# Patient Record
Sex: Male | Born: 1965 | Race: White | Hispanic: No | Marital: Married | Smoking: Former smoker
Health system: Southern US, Community
[De-identification: ages and names within clinical notes are randomized; demographics above are authoritative.]

## PROBLEM LIST (undated history)

## (undated) DIAGNOSIS — I219 Acute myocardial infarction, unspecified: Secondary | ICD-10-CM

## (undated) DIAGNOSIS — R0789 Other chest pain: Secondary | ICD-10-CM

## (undated) DIAGNOSIS — Z8669 Personal history of other diseases of the nervous system and sense organs: Secondary | ICD-10-CM

## (undated) DIAGNOSIS — I10 Essential (primary) hypertension: Secondary | ICD-10-CM

## (undated) DIAGNOSIS — I255 Ischemic cardiomyopathy: Secondary | ICD-10-CM

## (undated) DIAGNOSIS — C6290 Malignant neoplasm of unspecified testis, unspecified whether descended or undescended: Secondary | ICD-10-CM

## (undated) DIAGNOSIS — I249 Acute ischemic heart disease, unspecified: Secondary | ICD-10-CM

## (undated) DIAGNOSIS — E785 Hyperlipidemia, unspecified: Secondary | ICD-10-CM

## (undated) DIAGNOSIS — K219 Gastro-esophageal reflux disease without esophagitis: Secondary | ICD-10-CM

## (undated) DIAGNOSIS — I2510 Atherosclerotic heart disease of native coronary artery without angina pectoris: Secondary | ICD-10-CM

## (undated) DIAGNOSIS — Z91018 Allergy to other foods: Secondary | ICD-10-CM

## (undated) HISTORY — DX: Malignant neoplasm of unspecified testis, unspecified whether descended or undescended: C62.90

## (undated) HISTORY — DX: Gastro-esophageal reflux disease without esophagitis: K21.9

## (undated) HISTORY — DX: Personal history of other diseases of the nervous system and sense organs: Z86.69

## (undated) HISTORY — DX: Allergy to other foods: Z91.018

## (undated) HISTORY — DX: Ischemic cardiomyopathy: I25.5

## (undated) HISTORY — DX: Other chest pain: R07.89

## (undated) HISTORY — PX: ORCHIECTOMY: SHX2116

## (undated) HISTORY — DX: Hyperlipidemia, unspecified: E78.5

---

## 1998-08-29 ENCOUNTER — Ambulatory Visit (HOSPITAL_COMMUNITY): Admission: RE | Admit: 1998-08-29 | Discharge: 1998-08-29 | Payer: Self-pay | Admitting: Hematology and Oncology

## 1998-09-04 ENCOUNTER — Ambulatory Visit (HOSPITAL_COMMUNITY): Admission: RE | Admit: 1998-09-04 | Discharge: 1998-09-04 | Payer: Self-pay | Admitting: Hematology and Oncology

## 1998-09-04 ENCOUNTER — Encounter: Payer: Self-pay | Admitting: Hematology and Oncology

## 1999-06-12 ENCOUNTER — Encounter: Payer: Self-pay | Admitting: Family Medicine

## 1999-06-12 ENCOUNTER — Ambulatory Visit (HOSPITAL_COMMUNITY): Admission: RE | Admit: 1999-06-12 | Discharge: 1999-06-12 | Payer: Self-pay | Admitting: Family Medicine

## 1999-11-02 ENCOUNTER — Encounter: Payer: Self-pay | Admitting: Hematology and Oncology

## 1999-11-02 ENCOUNTER — Encounter: Admission: RE | Admit: 1999-11-02 | Discharge: 1999-11-02 | Payer: Self-pay | Admitting: Hematology and Oncology

## 2000-02-15 ENCOUNTER — Encounter: Payer: Self-pay | Admitting: Hematology and Oncology

## 2000-02-15 ENCOUNTER — Ambulatory Visit (HOSPITAL_COMMUNITY): Admission: RE | Admit: 2000-02-15 | Discharge: 2000-02-15 | Payer: Self-pay | Admitting: Hematology and Oncology

## 2000-02-17 ENCOUNTER — Ambulatory Visit (HOSPITAL_COMMUNITY): Admission: RE | Admit: 2000-02-17 | Discharge: 2000-02-17 | Payer: Self-pay | Admitting: Hematology and Oncology

## 2000-02-17 ENCOUNTER — Encounter: Payer: Self-pay | Admitting: Hematology and Oncology

## 2000-03-14 ENCOUNTER — Encounter (INDEPENDENT_AMBULATORY_CARE_PROVIDER_SITE_OTHER): Payer: Self-pay

## 2000-03-14 ENCOUNTER — Encounter: Payer: Self-pay | Admitting: Urology

## 2000-03-14 ENCOUNTER — Ambulatory Visit (HOSPITAL_COMMUNITY): Admission: RE | Admit: 2000-03-14 | Discharge: 2000-03-14 | Payer: Self-pay | Admitting: Urology

## 2000-04-05 ENCOUNTER — Encounter: Admission: RE | Admit: 2000-04-05 | Discharge: 2000-07-04 | Payer: Self-pay | Admitting: Radiation Oncology

## 2000-09-02 ENCOUNTER — Encounter: Payer: Self-pay | Admitting: Urology

## 2000-09-02 ENCOUNTER — Encounter: Admission: RE | Admit: 2000-09-02 | Discharge: 2000-09-02 | Payer: Self-pay | Admitting: Urology

## 2001-01-02 ENCOUNTER — Encounter: Payer: Self-pay | Admitting: Urology

## 2001-01-02 ENCOUNTER — Encounter: Admission: RE | Admit: 2001-01-02 | Discharge: 2001-01-02 | Payer: Self-pay | Admitting: Urology

## 2001-04-14 ENCOUNTER — Encounter: Admission: RE | Admit: 2001-04-14 | Discharge: 2001-04-14 | Payer: Self-pay | Admitting: Urology

## 2001-04-14 ENCOUNTER — Encounter: Payer: Self-pay | Admitting: Urology

## 2001-07-21 ENCOUNTER — Encounter: Payer: Self-pay | Admitting: Urology

## 2001-07-21 ENCOUNTER — Encounter: Admission: RE | Admit: 2001-07-21 | Discharge: 2001-07-21 | Payer: Self-pay | Admitting: Urology

## 2002-02-23 ENCOUNTER — Encounter: Admission: RE | Admit: 2002-02-23 | Discharge: 2002-02-23 | Payer: Self-pay | Admitting: Urology

## 2002-02-23 ENCOUNTER — Encounter: Payer: Self-pay | Admitting: Urology

## 2002-05-30 ENCOUNTER — Encounter: Payer: Self-pay | Admitting: Emergency Medicine

## 2002-05-30 ENCOUNTER — Emergency Department (HOSPITAL_COMMUNITY): Admission: EM | Admit: 2002-05-30 | Discharge: 2002-05-30 | Payer: Self-pay | Admitting: Unknown Physician Specialty

## 2002-11-05 ENCOUNTER — Ambulatory Visit (HOSPITAL_COMMUNITY): Admission: RE | Admit: 2002-11-05 | Discharge: 2002-11-05 | Payer: Self-pay | Admitting: Urology

## 2002-11-05 ENCOUNTER — Encounter: Payer: Self-pay | Admitting: Urology

## 2003-03-19 ENCOUNTER — Ambulatory Visit (HOSPITAL_COMMUNITY): Admission: RE | Admit: 2003-03-19 | Discharge: 2003-03-19 | Payer: Self-pay | Admitting: Urology

## 2003-10-04 ENCOUNTER — Ambulatory Visit (HOSPITAL_COMMUNITY): Admission: RE | Admit: 2003-10-04 | Discharge: 2003-10-04 | Payer: Self-pay | Admitting: Urology

## 2003-12-24 ENCOUNTER — Ambulatory Visit (HOSPITAL_COMMUNITY): Admission: RE | Admit: 2003-12-24 | Discharge: 2003-12-24 | Payer: Self-pay | Admitting: Family Medicine

## 2004-04-21 ENCOUNTER — Ambulatory Visit (HOSPITAL_COMMUNITY): Admission: RE | Admit: 2004-04-21 | Discharge: 2004-04-21 | Payer: Self-pay | Admitting: Urology

## 2004-11-24 ENCOUNTER — Ambulatory Visit (HOSPITAL_COMMUNITY): Admission: RE | Admit: 2004-11-24 | Discharge: 2004-11-24 | Payer: Self-pay | Admitting: Urology

## 2005-05-24 ENCOUNTER — Ambulatory Visit (HOSPITAL_COMMUNITY): Admission: RE | Admit: 2005-05-24 | Discharge: 2005-05-24 | Payer: Self-pay | Admitting: Urology

## 2006-10-12 ENCOUNTER — Ambulatory Visit (HOSPITAL_COMMUNITY): Admission: RE | Admit: 2006-10-12 | Discharge: 2006-10-12 | Payer: Self-pay | Admitting: Urology

## 2008-04-05 ENCOUNTER — Encounter: Admission: RE | Admit: 2008-04-05 | Discharge: 2008-04-05 | Payer: Self-pay | Admitting: Family Medicine

## 2008-05-27 LAB — HM COLONOSCOPY: HM Colonoscopy: NORMAL

## 2008-07-26 ENCOUNTER — Ambulatory Visit: Payer: Self-pay | Admitting: Diagnostic Radiology

## 2008-07-26 ENCOUNTER — Emergency Department (HOSPITAL_BASED_OUTPATIENT_CLINIC_OR_DEPARTMENT_OTHER): Admission: EM | Admit: 2008-07-26 | Discharge: 2008-07-26 | Payer: Self-pay | Admitting: Emergency Medicine

## 2008-07-29 ENCOUNTER — Ambulatory Visit: Payer: Self-pay | Admitting: Internal Medicine

## 2008-07-29 DIAGNOSIS — I119 Hypertensive heart disease without heart failure: Secondary | ICD-10-CM

## 2008-07-29 DIAGNOSIS — E785 Hyperlipidemia, unspecified: Secondary | ICD-10-CM | POA: Insufficient documentation

## 2008-07-29 DIAGNOSIS — K219 Gastro-esophageal reflux disease without esophagitis: Secondary | ICD-10-CM

## 2008-07-29 DIAGNOSIS — R0789 Other chest pain: Secondary | ICD-10-CM

## 2008-08-05 ENCOUNTER — Telehealth (INDEPENDENT_AMBULATORY_CARE_PROVIDER_SITE_OTHER): Payer: Self-pay | Admitting: *Deleted

## 2008-08-06 ENCOUNTER — Encounter: Payer: Self-pay | Admitting: Cardiology

## 2008-08-06 ENCOUNTER — Ambulatory Visit: Payer: Self-pay

## 2008-08-12 DIAGNOSIS — Z860100 Personal history of colon polyps, unspecified: Secondary | ICD-10-CM | POA: Insufficient documentation

## 2008-08-12 DIAGNOSIS — J309 Allergic rhinitis, unspecified: Secondary | ICD-10-CM | POA: Insufficient documentation

## 2008-08-21 ENCOUNTER — Encounter: Payer: Self-pay | Admitting: Cardiology

## 2008-08-21 ENCOUNTER — Ambulatory Visit: Payer: Self-pay | Admitting: Cardiology

## 2008-08-21 DIAGNOSIS — R002 Palpitations: Secondary | ICD-10-CM | POA: Insufficient documentation

## 2008-08-23 ENCOUNTER — Ambulatory Visit: Payer: Self-pay | Admitting: Internal Medicine

## 2008-08-23 LAB — CONVERTED CEMR LAB
ALT: 25 units/L (ref 0–53)
AST: 15 units/L (ref 0–37)
Albumin: 4.3 g/dL (ref 3.5–5.2)
Alkaline Phosphatase: 66 units/L (ref 39–117)
BUN: 16 mg/dL (ref 6–23)
Basophils Absolute: 0.1 10*3/uL (ref 0.0–0.1)
Basophils Relative: 1 % (ref 0–1)
Bilirubin, Direct: 0.2 mg/dL (ref 0.0–0.3)
CO2: 21 meq/L (ref 19–32)
Calcium: 8.7 mg/dL (ref 8.4–10.5)
Chloride: 105 meq/L (ref 96–112)
Cholesterol: 201 mg/dL — ABNORMAL HIGH (ref 0–200)
Creatinine, Ser: 0.89 mg/dL (ref 0.40–1.50)
Eosinophils Absolute: 0.2 10*3/uL (ref 0.0–0.7)
Eosinophils Relative: 2 % (ref 0–5)
Glucose, Bld: 98 mg/dL (ref 70–99)
HCT: 44.6 % (ref 39.0–52.0)
HDL: 31 mg/dL — ABNORMAL LOW (ref >39)
Hemoglobin: 15.4 g/dL (ref 13.0–17.0)
Indirect Bilirubin: 1.3 mg/dL — ABNORMAL HIGH (ref 0.0–0.9)
Lymphocytes Relative: 21 % (ref 12–46)
Lymphs Abs: 1.7 10*3/uL (ref 0.7–4.0)
MCHC: 34.5 g/dL (ref 30.0–36.0)
MCV: 88.5 fL (ref 78.0–100.0)
Monocytes Absolute: 0.8 10*3/uL (ref 0.1–1.0)
Monocytes Relative: 10 % (ref 3–12)
Neutro Abs: 5.3 10*3/uL (ref 1.7–7.7)
Neutrophils Relative %: 66 % (ref 43–77)
Platelets: 205 10*3/uL (ref 150–400)
Potassium: 4.2 meq/L (ref 3.5–5.3)
RBC: 5.04 M/uL (ref 4.22–5.81)
RDW: 13.5 % (ref 11.5–15.5)
Sodium: 141 meq/L (ref 135–145)
TSH: 0.494 microintl units/mL (ref 0.350–4.500)
Total Bilirubin: 1.5 mg/dL — ABNORMAL HIGH (ref 0.3–1.2)
Total CHOL/HDL Ratio: 6.5
Total Protein: 6.8 g/dL (ref 6.0–8.3)
Triglycerides: 410 mg/dL — ABNORMAL HIGH (ref <150)
WBC: 8.1 10*3/uL (ref 4.0–10.5)

## 2008-09-04 ENCOUNTER — Telehealth: Payer: Self-pay | Admitting: Internal Medicine

## 2008-09-27 ENCOUNTER — Ambulatory Visit: Payer: Self-pay | Admitting: Internal Medicine

## 2008-11-12 ENCOUNTER — Ambulatory Visit: Payer: Self-pay | Admitting: Internal Medicine

## 2008-11-12 DIAGNOSIS — J029 Acute pharyngitis, unspecified: Secondary | ICD-10-CM

## 2008-11-12 LAB — CONVERTED CEMR LAB: Rapid Strep: NEGATIVE

## 2009-02-28 ENCOUNTER — Telehealth: Payer: Self-pay | Admitting: Internal Medicine

## 2009-03-07 ENCOUNTER — Ambulatory Visit: Payer: Self-pay | Admitting: Internal Medicine

## 2009-03-07 DIAGNOSIS — L919 Hypertrophic disorder of the skin, unspecified: Secondary | ICD-10-CM

## 2009-03-07 DIAGNOSIS — L272 Dermatitis due to ingested food: Secondary | ICD-10-CM

## 2009-03-07 DIAGNOSIS — L909 Atrophic disorder of skin, unspecified: Secondary | ICD-10-CM | POA: Insufficient documentation

## 2009-10-17 ENCOUNTER — Ambulatory Visit: Payer: Self-pay | Admitting: Diagnostic Radiology

## 2009-10-17 ENCOUNTER — Ambulatory Visit (HOSPITAL_BASED_OUTPATIENT_CLINIC_OR_DEPARTMENT_OTHER): Admission: RE | Admit: 2009-10-17 | Discharge: 2009-10-17 | Payer: Self-pay | Admitting: Internal Medicine

## 2009-10-17 ENCOUNTER — Ambulatory Visit: Payer: Self-pay | Admitting: Internal Medicine

## 2009-10-17 DIAGNOSIS — R0602 Shortness of breath: Secondary | ICD-10-CM | POA: Insufficient documentation

## 2009-10-17 DIAGNOSIS — Z8547 Personal history of malignant neoplasm of testis: Secondary | ICD-10-CM | POA: Insufficient documentation

## 2009-10-17 DIAGNOSIS — C6290 Malignant neoplasm of unspecified testis, unspecified whether descended or undescended: Secondary | ICD-10-CM | POA: Insufficient documentation

## 2009-10-17 LAB — CONVERTED CEMR LAB
BUN: 18 mg/dL (ref 6–23)
Basophils Absolute: 0.1 10*3/uL (ref 0.0–0.1)
Basophils Relative: 1 % (ref 0–1)
CO2: 29 meq/L (ref 19–32)
Calcium: 9.5 mg/dL (ref 8.4–10.5)
Chloride: 102 meq/L (ref 96–112)
Creatinine, Ser: 0.94 mg/dL (ref 0.40–1.50)
Eosinophils Absolute: 0.1 10*3/uL (ref 0.0–0.7)
Eosinophils Relative: 1 % (ref 0–5)
Glucose, Bld: 107 mg/dL — ABNORMAL HIGH (ref 70–99)
HCT: 44.2 % (ref 39.0–52.0)
Hemoglobin: 15.8 g/dL (ref 13.0–17.0)
Lymphocytes Relative: 21 % (ref 12–46)
Lymphs Abs: 1.9 10*3/uL (ref 0.7–4.0)
MCHC: 35.7 g/dL (ref 30.0–36.0)
MCV: 83.6 fL (ref 78.0–100.0)
Monocytes Absolute: 0.6 10*3/uL (ref 0.1–1.0)
Monocytes Relative: 7 % (ref 3–12)
Neutro Abs: 6.3 10*3/uL (ref 1.7–7.7)
Neutrophils Relative %: 70 % (ref 43–77)
Platelets: 218 10*3/uL (ref 150–400)
Potassium: 4.2 meq/L (ref 3.5–5.3)
Pro B Natriuretic peptide (BNP): <2 pg/mL (ref 0.0–100.0)
RBC: 5.29 M/uL (ref 4.22–5.81)
RDW: 12.5 % (ref 11.5–15.5)
Sodium: 142 meq/L (ref 135–145)
Testosterone: 140.75 ng/dL — ABNORMAL LOW (ref 350–890)
WBC: 8.9 10*3/uL (ref 4.0–10.5)

## 2009-10-21 ENCOUNTER — Telehealth: Payer: Self-pay | Admitting: Internal Medicine

## 2009-10-21 ENCOUNTER — Ambulatory Visit: Payer: Self-pay | Admitting: Internal Medicine

## 2009-10-22 ENCOUNTER — Encounter: Payer: Self-pay | Admitting: Internal Medicine

## 2009-10-27 ENCOUNTER — Telehealth: Payer: Self-pay | Admitting: Internal Medicine

## 2009-12-22 ENCOUNTER — Ambulatory Visit: Payer: Self-pay | Admitting: Family

## 2009-12-22 DIAGNOSIS — R1310 Dysphagia, unspecified: Secondary | ICD-10-CM | POA: Insufficient documentation

## 2010-01-09 ENCOUNTER — Ambulatory Visit: Payer: Self-pay | Admitting: Internal Medicine

## 2010-01-09 DIAGNOSIS — J018 Other acute sinusitis: Secondary | ICD-10-CM

## 2010-02-03 ENCOUNTER — Telehealth: Payer: Self-pay | Admitting: Internal Medicine

## 2010-03-02 ENCOUNTER — Ambulatory Visit
Admission: RE | Admit: 2010-03-02 | Discharge: 2010-03-02 | Payer: Self-pay | Source: Home / Self Care | Attending: Internal Medicine | Admitting: Internal Medicine

## 2010-03-02 ENCOUNTER — Telehealth: Payer: Self-pay | Admitting: Internal Medicine

## 2010-03-02 DIAGNOSIS — L03221 Cellulitis of neck: Secondary | ICD-10-CM

## 2010-03-02 DIAGNOSIS — L0211 Cutaneous abscess of neck: Secondary | ICD-10-CM | POA: Insufficient documentation

## 2010-03-06 ENCOUNTER — Encounter
Admission: RE | Admit: 2010-03-06 | Discharge: 2010-03-06 | Payer: Self-pay | Source: Home / Self Care | Attending: Internal Medicine | Admitting: Internal Medicine

## 2010-03-06 ENCOUNTER — Telehealth: Payer: Self-pay | Admitting: Internal Medicine

## 2010-03-06 DIAGNOSIS — E041 Nontoxic single thyroid nodule: Secondary | ICD-10-CM | POA: Insufficient documentation

## 2010-03-09 ENCOUNTER — Telehealth: Payer: Self-pay | Admitting: Internal Medicine

## 2010-03-09 ENCOUNTER — Encounter
Admission: RE | Admit: 2010-03-09 | Discharge: 2010-03-09 | Payer: Self-pay | Source: Home / Self Care | Attending: Internal Medicine | Admitting: Internal Medicine

## 2010-03-09 DIAGNOSIS — E042 Nontoxic multinodular goiter: Secondary | ICD-10-CM | POA: Insufficient documentation

## 2010-03-10 NOTE — Assessment & Plan Note (Signed)
Summary: 2:45 appt sore throat/SOB & hi bp/dt   Vital Signs:  Patient profile:   45 year old male Height:      72 inches Weight:      195.50 pounds BMI:     26.61 O2 Sat:      97 % on Room air Temp:     98.6 degrees F oral Pulse rate:   91 / minute Pulse rhythm:   regular Resp:     18 per minute BP sitting:   130 / 100  (left arm) Cuff size:   large  Vitals Entered By: Glendell Docker CMA (October 17, 2009 3:11 PM)  O2 Flow:  Room air CC: Sore Throat Is Patient Diabetic? No Pain Assessment Patient in pain? no        Primary Care Provider:  Dondra Spry DO  CC:  Sore Throat.  History of Present Illness:  45 y/o with hx of testicular cancer c/o intermittent shortness breath no fever or chlls some neck soreness and sore throat  he is very worried that shortness of breath assoc with recurrence of testicular cancer  returned from business trip to [place] he denies calf pain or swelling  Preventive Screening-Counseling & Management  Alcohol-Tobacco     Smoking Status: quit  Allergies: 1)  ! Pcn 2)  ! Asa 3)  ! Iodine 4)  ! * Xray Dye 5)  ! * Shellfish  Past History:  Past Medical History: Current Problems:  HYPERLIPIDEMIA (ICD-272.4) HYPERTENSION (ICD-401.9)  CHEST PAIN, ATYPICAL (ICD-786.59) - negative cardiac workup 2010 ALLERGIC RHINITIS (ICD-477.9) COLONIC POLYPS, HX OF (ICD-V12.72)   GERD (ICD-530.81)  FAMILY HISTORY OF CAD MALE 1ST DEGREE RELATIVE <50 (ICD-V17.3) Testicular cancer - followed by Dr. Isabel Caprice Hx of elevated BP Hx of migraines. 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   Head:  normocephalic and atraumatic.   Ears:  R ear normal and L ear normal.   Mouth:  pharyngeal erythema.   Neck:  no masses or adenopathy Chest Wall:  No deformities, masses, tenderness or gynecomastia noted. Lungs:  normal respiratory effort and normal breath sounds.   Heart:  normal rate, regular rhythm, and no gallop.   Abdomen:  soft, non-tender, and normal bowel sounds.   Extremities:  no calf tenderness or edema Psych:  slightly anxious.     Impression & Recommendations:  Problem # 1:  SHORTNESS OF BREATH (ICD-786.05) 45 y/o with intermeittent shortness of breath.  rule out asthma.  this could be assoc with anxiety Orders: T-2 View CXR, Same Day (71020.5TC) T-Basic Metabolic Panel [phone]) T-CBC w/Diff [id number]) T-BNP  (B Natriuretic Peptide) [phone]) T- * Misc. Laboratory test [id number]) Pulmonary Referral (Pulmonary)  Problem # 2:  PHARYNGITIS (ICD-462)  His updated medication list for this problem includes:    Cefuroxime Axetil 500 Mg Tabs (Cefuroxime axetil) ..... One by mouth two times a day  Problem # 3:  TESTICULAR CANCER (ICD-186.9) check tumor markers.  forward copy to urologist  Orders: T-Testosterone; Total [phone]) T- * Misc. Laboratory test [phone])  Complete Medication List: 1)  Androgel Pump 1 % Gel (Testosterone) .... Apply to skin 6 times daily 2)  Omeprazole 20 Mg Cpdr (Omeprazole) .... Take 1 capsule by mouth once a day 3)  Epipen 2-pak 0.3 Mg/0.8ml Devi (Epinephrine) .... Use as needed for allergic reaction as directed 4)  Cefuroxime Axetil 500 Mg Tabs (Cefuroxime axetil) .... One by mouth two times a day  Patient Instructions: 1)  Please schedule a follow-up appointment in 2 weeks. 2)  Take fexofenadine 180 mg once daily Prescriptions: CEFUROXIME AXETIL  500 MG TABS (CEFUROXIME AXETIL) one by mouth two times a day  #14 x 0   Entered and Authorized by:   D. Thomos Lemons DO   Signed by:   D. Thomos Lemons DO on 10/17/2009   Method used:   Electronically to        [id number] (retail)       [address] [place], [place]  [address]       Ph: [phone]       Fax: [phone]   RxID:   [id number]   Current Allergies (reviewed today): ! PCN ! ASA ! IODINE ! * XRAY DYE ! * SHELLFISH

## 2010-03-10 NOTE — Progress Notes (Signed)
Summary: f/u   Phone Note Outgoing Call   Action Taken: Assistance medications ready for pick up Summary of Call: pt should have f/u visit within 2 weeks  Initial call taken by: D. Thomos Lemons DO,  October 27, 2009 6:28 PM  Follow-up for Phone Call        F/u made 11/18/09 @ 4:15pm. Pt aware. Nicki Guadalajara Fergerson CMA Duncan Dull)  October 28, 2009 5:23 PM

## 2010-03-10 NOTE — Assessment & Plan Note (Signed)
Summary: 6 month follow up/mhf, resched- jr   Vital Signs:  Patient profile:   45 year old male Weight:      195.50 pounds BMI:     26.61 O2 Sat:      95 % on Room air Temp:     97.9 degrees F oral Pulse rate:   101 / minute Pulse rhythm:   regular Resp:     18 per minute BP sitting:   124 / 90  (right arm) Cuff size:   large  Vitals Entered By: Glendell Docker CMA (March 07, 2009 11:07 AM)  O2 Flow:  Room air  Primary Care Provider:  D. Thomos Lemons DO  CC:  6 Month Follow up .  History of Present Illness: 6 Month Follow up  45 y/o white male with hx of testicular cancer noticed warty growth on left lower buttock one month ago.  slightly irritating.    going to [place] for 2 months next Thursday work related.  he has severe shell fish allergy and requires refill on Epi pen  hyperlipidemia - he has been following healthy diet.  he does not want to take statin if poss  Preventive Screening-Counseling & Management  Alcohol-Tobacco     Smoking Status: quit  Allergies: 1)  ! Pcn 2)  ! Asa 3)  ! Iodine 4)  ! * Xray Dye 5)  ! * Shellfish  Past History:  Past Medical History: Current Problems:  HYPERLIPIDEMIA (ICD-272.4) HYPERTENSION (ICD-401.9) CHEST PAIN, ATYPICAL (ICD-786.59) - negative cardiac workup 2010 ALLERGIC RHINITIS (ICD-477.9) COLONIC POLYPS, HX OF (ICD-V12.72)   GERD (ICD-530.81)  FAMILY HISTORY OF CAD MALE 1ST DEGREE RELATIVE <50 (ICD-V17.3) Testicular cancer - followed by Dr. Isabel Caprice Hx of elevated BP Hx of migraines. 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   Lungs:  normal respiratory effort and normal breath sounds.   Heart:  normal rate, regular rhythm, and no gallop.   Genitalia:  circumcised , 2-3 mm peduculated lesion left perineal area   Impression & Recommendations:  Problem # 1:  SKIN TAG (ICD-701.9) Assessment New He has skin tag perineal area.  he defers excision for now.  he is planning business trip for next 2 months.  he will schedule procedure visit.  Problem # 2:  HYPERLIPIDEMIA (ICD-272.4) He has been following better diet.  FLP in 3 months.  Problem # 3:  GERD (ICD-530.81) Pt ran out of omeprazole for a while.  he exp sign rebound reflux.  we discussed trying to transition off in the future using OTC zantac.  anti reflux measures reviewed.  His updated medication list for this problem includes:    Omeprazole 20 Mg Cpdr (Omeprazole) .[name]... Take 1 capsule by mouth once a day  Problem # 4:  ALLERGY, FOOD (ICD-693.1) Hx of anaphylaxis with shell fish.  epi pen refilled.  Complete Medication List: 1)  Androgel Pump 1 % Gel (Testosterone) .... Apply to skin 6 times daily 2)  Omeprazole 20 Mg Cpdr (Omeprazole) .... Take 1 capsule by  mouth once a day 3)  Epipen 2-pak 0.3 Mg/0.55ml Devi (Epinephrine) .... Use as needed for allergic reaction as directed Prescriptions: OMEPRAZOLE 20 MG CPDR (OMEPRAZOLE) Take 1 capsule by mouth once a day  #30 x 5   Entered and Authorized by:   D. Thomos Lemons DO   Signed by:   D. Thomos Lemons DO on 03/07/2009   Method used:   Electronically to        [id number] (retail)       [address] [place], [place]  [address]       Ph: [phone]       Fax: [phone]   RxID:   [id number] EPIPEN 2-PAK 0.3 MG/0.3ML DEVI (EPINEPHRINE) use as needed for allergic reaction as directed  #1 x 3   Entered and Authorized by:   D. Thomos Lemons DO   Signed by:   D. Thomos Lemons DO on 03/07/2009   Method used:   Electronically  to        [id number] (retail)       [address] [place], [place]  [address]       Ph: [phone]       Fax: [phone]   RxID:   [id number]    Immunization History:  Influenza Immunization History:    Influenza:  declined (02/28/2009)   Current Allergies (reviewed today): ! PCN ! ASA ! IODINE ! * XRAY DYE ! * SHELLFISH

## 2010-03-10 NOTE — Miscellaneous (Signed)
Summary: Orders Update pft charges  Clinical Lists Changes  Orders: Added new Service order of Lung Volumes (94240) - Signed Added new Service order of Carbon Monoxide diffusing w/capacity (94720) - Signed Added new Service order of Spirometry (Pre & Post) (94060) - Signed 

## 2010-03-10 NOTE — Assessment & Plan Note (Signed)
Summary: ST/hea--rm 5   Vital Signs:  Patient profile:   45 year old male Height:      72 inches Weight:      195.25 pounds BMI:     26.58 Temp:     98.3 degrees F oral Pulse rate:   78 / minute Pulse rhythm:   regular Resp:     18 per minute BP sitting:   150 / 100  (right arm) Cuff size:   regular  Vitals Entered By: Mervin Kung CMA Duncan Dull) (December 22, 2009 8:47 AM) CC: Pt states he feels like he has something in his throat when he swallows x 3 days. Is Patient Diabetic? No Comments Pt states Androgel is now 1.67% Apply 4 pumps to skin daily. Pt has completed Cefuroxime. Nicki Guadalajara Fergerson CMA Duncan Dull)  December 22, 2009 8:55 AM    Primary Care Provider:  Dondra Spry DO  CC:  Pt states he feels like he has something in his throat when he swallows x 3 days.[name]  History of Present Illness: patient is a 45 year old male who comes in today with complaint of sensation of "something in my throat". The symptoms started 3 days ago. He denies any trauma or choking-like incidents.He denies any associated postnasal drip symptoms. He does not that if her symptoms have been somewhat worse than usual the last several days. He continues on his over-the-counter omeprazole. He leaves tomorrow for a two-week trip to [place] for business. He reports that he is able to tolerate food and drink without difficulty and is able to swallow. He expresses concern that this may be a throat cancer. He reports that he quit smoking 3 years ago.   Allergies: 1)  ! Pcn 2)  ! Asa 3)  ! Iodine 4)  ! * Xray Dye 5)  ! * Shellfish  Past History:  Past Medical History: Last updated: 10/17/2009 Current Problems:  HYPERLIPIDEMIA (ICD-272.4) HYPERTENSION (ICD-401.9)  CHEST PAIN, ATYPICAL (ICD-786.59) - negative cardiac workup 2010 ALLERGIC RHINITIS (ICD-477.9) COLONIC POLYPS, HX OF (ICD-V12.72)   GERD (ICD-530.81)  FAMILY HISTORY OF CAD MALE 1ST DEGREE RELATIVE <50 (ICD-V17.3) Testicular cancer - followed  by Dr. Isabel Caprice Hx of elevated BP Hx of migraines. Food allergy - Anaphylaxis rxn with shell fish  Past Surgical History: Last updated: 10/17/2009 H/O bilateral orchiectomy and radiation for testicular cancer       Review of Systems       see HPI  Physical Exam  General:  Well-developed,well-nourished,in no acute distress; alert,appropriate and cooperative throughout examination Ears:  External ear exam shows no significant lesions or deformities.  Otoscopic examination reveals clear canals, tympanic membranes are intact bilaterally without bulging, retraction, inflammation or discharge. Hearing is grossly normal bilaterally. Mouth:  Oral mucosa and oropharynx without lesions or exudates.  Teeth in good repair. Neck:  No deformities, masses, or tenderness noted. Lungs:  Normal respiratory effort, chest expands symmetrically. Lungs are clear to auscultation, no crackles or wheezes. Heart:  Normal rate and regular rhythm. S1 and S2 normal without gallop, murmur, click, rub or other extra sounds. Cervical Nodes:  No lymphadenopathy noted   Impression & Recommendations:  Problem # 1:  DYSPHAGIA UNSPECIFIED (ICD-787.20) Assessment Deteriorated I suspect that the patient's symptoms of throat fullness are most likely related to worsening GERD symptoms.  He remains able to tolerate food and drink without difficulty and he denies throat pain. Will plan to switch him from his over-the-counter Prilosec 20 mg to  Zegerid 40. I have instructed the  patient to call if his symptoms worsen if he develops her pain or if it becomes difficult to swallow food or liquid. He is to follow up with Dr. Artist Pais in 2 weeks if symptoms have not improved at this time consider referral to GI vs ENT for further evaluation.   His updated medication list for this problem includes    Zegerid 40-1100 Mg Caps (Omeprazole-sodium bicarbonate) ..... One cap by mouth once daily  Complete Medication List: 1)  Androgel Pump 20.25  Mg/act (1.62%) Gel (Testosterone) .... Apply 4 pumps to skin once daily 2)  Zegerid 40-1100 Mg Caps (Omeprazole-sodium bicarbonate) .... One cap by mouth once daily 3)  Epipen 2-pak 0.3 Mg/0.43ml Devi (Epinephrine) .... Use as needed for allergic reaction as directed  Patient Instructions: 1)  Call if you develop throat pain, or inability to swallow food/drink. 2)  Follow up with Dr. Artist Pais in 2 weeks. Prescriptions: ZEGERID 40-1100 MG CAPS (OMEPRAZOLE-SODIUM BICARBONATE) one cap by mouth once daily  #40 x 0   Entered and Authorized by:   Lemont Fillers FNP   Signed by:   Lemont Fillers FNP on 12/22/2009   Method used:   Electronically to        [phone][facility] (retail)       [address] [place], [place]  [address]       Ph: [phone]       Fax: [phone]   RxID:   [id number]    Orders Added: 1)  Est. Patient Level III [[address]]    Current Allergies (reviewed today): ! PCN ! ASA ! IODINE ! * XRAY DYE ! * SHELLFISH

## 2010-03-10 NOTE — Progress Notes (Signed)
Summary: Omeprazole Rx  Phone Note Refill Request Message from:  Fax from Pharmacy on February 28, 2009 12:41 PM  Refills Requested: Medication #1:  omeprazole dr 20 mg capsule   Brand Name Necessary? No   Supply Requested: 1 month   Last Refilled: 01/10/2009  Method Requested: Electronic Next Appointment Scheduled: 03-11-09 815 lab  Initial call taken by: Roselle Locus,  February 28, 2009 12:41 PM  Follow-up for Phone Call        ok to refill x 3 Follow-up by: D. Thomos Lemons DO,  February 28, 2009 4:55 PM  Additional Follow-up for Phone Call Additional follow up Details #1::        Rx sent electronically to pharmacy Additional Follow-up by: Glendell Docker CMA,  February 28, 2009 5:33 PM    New/Updated Medications: OMEPRAZOLE 20 MG CPDR (OMEPRAZOLE) Take 1 capsule by mouth once a day Prescriptions: OMEPRAZOLE 20 MG CPDR (OMEPRAZOLE) Take 1 capsule by mouth once a day  #30 x 3   Entered by:   Glendell Docker CMA   Authorized by:   D. Thomos Lemons DO   Signed by:   Glendell Docker CMA on 02/28/2009   Method used:   Electronically to        [id number][facility] (retail)       [address] [place], [place]  [address]       Ph: [phone]       Fax: [phone]   RxID:   [id number]

## 2010-03-10 NOTE — Progress Notes (Signed)
Summary: lab results  Phone Note Outgoing Call   Summary of Call: call pt - the following blood test normal - (CBC, b HCG tumor marker,  D Dimer (blood clot test )) testosterone level is low  Plz mail copy of lab to pt and his urologist Initial call taken by: D. Thomos Lemons DO,  October 21, 2009 1:21 PM  Follow-up for Phone Call        Pt notified of results. Copy mailed to pt and faxed to Dr Isabel Caprice [facility]). Nicki Guadalajara Fergerson CMA Duncan Dull)  October 21, 2009 2:06 PM

## 2010-03-12 NOTE — Progress Notes (Signed)
Summary: Updated Order [facility]  Phone Note From Other Clinic   Caller: mary [facility] Call For: yoo Request: Talk with Nurse Summary of Call: Mary from [facility] patient is in their office and she needs to ask you  a question please call [phone] Initial call taken by: Elba Barman,  March 02, 2010 3:58 PM  Follow-up for Phone Call        call returned to [facility] at [phone],  she states the CT ordered by Dr Artist Pais is normally done with with IV  Contrast.  She wouldl ike to know if Dr Artist Pais would re-write the order for the CT to be done with contrast. If the patient needs to pre-dosed, she stated that could be arranged. If approved fax to [facility] attention [phone] at [facility].Corrie Dandy states they will precert and call the patient and place him back on the schedule. Follow-up by: Glendell Docker CMA,  March 02, 2010 4:41 PM  Additional Follow-up for Phone Call Additional follow up Details #1::        ok to change CT to with contrast as long as they pre medicate pt  please review with pt his previous reaction to IV contrast dye Additional Follow-up by: D. Thomos Lemons DO,  March 02, 2010 4:43 PM    Additional Follow-up for Phone Call Additional follow up Details #2::    call returned to [facility] at [phone], she states she has not received the updated order for the MRI with contrast. She was informed the order would be faxed to her.   Call placed to patient at [phone], no answer. A voice message was left for patient to return call regarding his allergic reaction to IV dye.  Order faxed to [facility] at [phone]   Follow-up by: Glendell Docker CMA,  March 03, 2010 9:36 AM  Additional Follow-up for Phone Call Additional follow up Details #3:: Details for Additional Follow-up Action Taken: patient returned call stating that he is usually pre-medicated prior to scans. He states his allergy is related to seafood. He was informed order has been   authorized to pre-medicate.  Additional Follow-up by: Glendell Docker CMA,  March 03, 2010 12:06 PM

## 2010-03-12 NOTE — Progress Notes (Signed)
Summary: Rx clarification  Phone Note Outgoing Call   Call placed by: Glendell Docker CMA,  February 03, 2010 8:54 AM Call placed to: Patient Summary of Call: call placed to patient at [phone], no answer. A voice message was left for patient to return call regarding rx refill.  Refill request is on Omeprazole and  Patient is taking Zegerid. Need to verify which medication patient is currently taking Initial call taken by: Glendell Docker CMA,  February 03, 2010 8:55 AM  Follow-up for Phone Call        attempted to contact patient at [phone], no answer. A voice message was left for patient to return call regarding rx refill Follow-up by: Glendell Docker CMA,  February 04, 2010 1:44 PM  Additional Follow-up for Phone Call Additional follow up Details #1::        Left detailed message re: need for clarification on pt's cell #. Nicki Guadalajara Fergerson CMA Duncan Dull)  February 05, 2010 9:14 AM     Additional Follow-up for Phone Call Additional follow up Details #2::    patient returned call, stating that he is still taking the Zegerid. He did not need refills at this time Follow-up by: Glendell Docker CMA,  February 05, 2010 3:40 PM

## 2010-03-12 NOTE — Assessment & Plan Note (Signed)
Summary: CONGESTION/HEA   Vital Signs:  Patient profile:   45 year old male Height:      72 inches Weight:      200 pounds BMI:     27.22 O2 Sat:      96 % on Room air Temp:     98.5 degrees F oral Resp:     20 per minute BP sitting:   120 / 90  (left arm) Cuff size:   large  Vitals Entered By: Glendell Docker CMA (January 09, 2010 11:17 AM)  O2 Flow:  Room air CC: Cough Is Patient Diabetic? No Pain Assessment Patient in pain? no        Primary Care Aasiyah Auerbach:  Dondra Spry DO  CC:  Cough.  History of Present Illness: 45 y/o  c/o productive cough yellow in color, throat irritation, head congestion , and left ear pain, taken Robitussin with some relief, ongoing for the past 7 days no wheezing   Preventive Screening-Counseling & Management  Alcohol-Tobacco     Smoking Status: quit  Allergies: 1)  ! Pcn 2)  ! Asa 3)  ! Iodine 4)  ! * Xray Dye 5)  ! * Shellfish  Past History:  Past Medical History: Current Problems:  HYPERLIPIDEMIA (ICD-272.4) HYPERTENSION (ICD-401.9)  CHEST PAIN, ATYPICAL (ICD-786.59) - negative cardiac workup 2010 ALLERGIC RHINITIS (ICD-477.9) COLONIC POLYPS, HX OF (ICD-V12.72)    GERD (ICD-530.81)  FAMILY HISTORY OF CAD MALE 1ST DEGREE RELATIVE <50 (ICD-V17.3) Testicular cancer - followed by Dr. Isabel Caprice Hx of elevated BP Hx of migraines. 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   Ears:  R ear normal and L ear normal.   Nose:  mucosal erythema and mucosal edema.   Mouth:   pharyngeal erythema.   Lungs:  Normal respiratory effort, chest expands symmetrically. Lungs are clear to auscultation, no crackles or wheezes. Heart:  Normal rate and regular rhythm. S1 and S2 normal without gallop, murmur, click, rub or other extra sounds.   Impression & Recommendations:  Problem # 1:  RHINOSINUSITIS, ACUTE (ICD-461.8)  His updated medication list for this problem includes:    Cefuroxime Axetil 500 Mg Tabs (Cefuroxime axetil) ..... One by mouth two times a day    Hydrocodone-homatropine 5-1.5 Mg/39ml Syrp (Hydrocodone-homatropine) .Marland KitchenMarland KitchenMarland KitchenMarland Kitchen 5 ml by mouth two times a day as needed  Instructed on treatment. Call if symptoms persist or worsen.   Complete Medication List: 1)  Androgel Pump 20.25 Mg/act (1.62%) Gel (Testosterone) .... Apply 4 pumps to skin once daily 2)  Zegerid 40-1100 Mg Caps (Omeprazole-sodium bicarbonate) .... One cap by mouth once daily 3)  Epipen 2-pak 0.3 Mg/0.20ml Devi (Epinephrine) .... Use as needed for allergic reaction as directed 4)  Cefuroxime Axetil 500 Mg Tabs (Cefuroxime axetil) .... One by mouth two times a day 5)  Hydrocodone-homatropine 5-1.5 Mg/71ml Syrp (Hydrocodone-homatropine) .... 5 ml by mouth two times a day as needed  Patient Instructions: 1)  Patient advised to call office if symptoms persist  or worsen. Prescriptions: HYDROCODONE-HOMATROPINE 5-1.5 MG/5ML SYRP (HYDROCODONE-HOMATROPINE) 5 ml by mouth two times a day as needed  #90 ml x 0   Entered and Authorized by:   D. Thomos Lemons DO   Signed by:   D. Thomos Lemons DO on 01/09/2010   Method used:   Print then Give to Patient   RxID:   [id number] CEFUROXIME AXETIL 500 MG TABS (CEFUROXIME AXETIL) one by mouth two times a day  #20 x 0   Entered and Authorized by:   D. Thomos Lemons DO   Signed by:   D. Thomos Lemons DO on 01/09/2010   Method used:   [facility] to        [facility] [phone]* (retail)       [address] [place], [place]  [address]       Ph:  [phone]       Fax: [phone]   RxID:   [id number]    Orders Added: 1)  Est. Patient Level III [660[address]]   Immunization History:  Influenza Immunization History:    Influenza:  declined (01/09/2010)   Contraindications/Deferment of Procedures/Staging:    Test/Procedure: FLU VAX    Reason for deferment: patient declined   Immunization History:  Influenza Immunization History:    Influenza:  Declined (01/09/2010)   Orders Added: 1)  Est. Patient Level III [[address]]   Current Allergies (reviewed today): ! PCN ! ASA ! IODINE ! * XRAY DYE ! * SHELLFISH

## 2010-03-18 NOTE — Progress Notes (Signed)
  Phone Note Outgoing Call   Summary of Call: pt notified of thyroid u/s results I suggest endo consultation to discuss tx options  Initial call taken by: D. Thomos Lemons DO,  March 09, 2010 4:57 PM  Follow-up for Phone Call        Referral to Dr Talmage Coin   Follow-up by: Darral Dash,  March 10, 2010 2:57 PM  New Problems: GOITER, MULTINODULAR (ICD-241.1)   New Problems: GOITER, MULTINODULAR (ICD-241.1)

## 2010-03-18 NOTE — Progress Notes (Signed)
  Phone Note Outgoing Call   Summary of Call: discussed results of CT of neck with pt.   neck swelling is much better we dicussed incidental finding of thyroid nodule.  obtain thyroid u/s Initial call taken by: D. 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

## 2010-03-26 NOTE — Assessment & Plan Note (Signed)
Summary: sore throat/ss   Vital Signs:  Patient profile:   45 year old male Height:      72 inches Weight:      196.50 pounds BMI:     26.75 O2 Sat:      96 % on Room air Temp:     98.4 degrees F oral Pulse rate:   98 / minute Resp:     18 per minute BP sitting:   120 / 90  (left arm) Cuff size:   large  Vitals Entered By: Glendell Docker CMA (March 02, 2010 12:02 PM)  O2 Flow:  Room air CC: Sore Throat & swelling chin Is Patient Diabetic? No Pain Assessment Patient in pain? no        Primary Care Provider:  Dondra Spry DO  CC:  Sore Throat & swelling chin.  History of Present Illness:   45 y/o male c/o sore throat, discomfort with swallowing he also noticed increased swelling below his chin no fever denies recent dental problems   Preventive Screening-Counseling & Management  Alcohol-Tobacco     Smoking Status: quit  Allergies: 1)  ! Pcn 2)  ! Asa 3)  ! Iodine 4)  ! * Xray Dye 5)  ! * Shellfish  Past History:  Past Medical History: Current Problems:  HYPERLIPIDEMIA (ICD-272.4) HYPERTENSION (ICD-401.9)  CHEST PAIN, ATYPICAL (ICD-786.59) - negative cardiac workup 2010 ALLERGIC RHINITIS (ICD-477.9) COLONIC POLYPS, HX OF (ICD-V12.72)    GERD (ICD-530.81)   FAMILY HISTORY OF CAD MALE 1ST DEGREE RELATIVE <50 (ICD-V17.3) Testicular cancer - followed by Dr. Isabel Caprice Hx of elevated BP Hx of migraines. Food allergy - Anaphylaxis rxn with shell fish  Past Surgical History: H/O bilateral orchiectomy and radiation for testicular cancer         Physical Exam  General:  alert, well-developed, and well-nourished.   Neck:  small egg shaped swelling below chin - mildly tender.  no fluctuance Lungs:  normal respiratory effort and normal breath sounds.   Heart:  normal rate, regular rhythm, and no gallop.     Impression & Recommendations:  Problem # 1:  ABSCESS, NECK (ICD-682.1) take abx as directed.  obtain CT of neck Patient advised to call office  if symptoms persist or worsen.  The following medications were removed from the medication list:    Cefuroxime Axetil 500 Mg Tabs (Cefuroxime axetil) ..... One by mouth two times a day His updated medication list for this problem includes:    Clindamycin Hcl 300 Mg Caps (Clindamycin hcl) ..... One by mouth three times a day  Orders: CT without Contrast (CT w/o contrast)  Complete Medication List: 1)  Androgel Pump 20.25 Mg/act (1.62%) Gel (Testosterone) .... Apply 4 pumps to skin once daily 2)  Zegerid 40-1100 Mg Caps (Omeprazole-sodium bicarbonate) .... One cap by mouth once daily 3)  Epipen 2-pak 0.3 Mg/0.3ml Devi (Epinephrine) .... Use as needed for allergic reaction as directed 4)  Clindamycin Hcl 300 Mg Caps (Clindamycin hcl) .... One by mouth three times a day  Patient Instructions: 1)  Our office will contact you re:  CT scan results Prescriptions: CLINDAMYCIN HCL 300 MG CAPS (CLINDAMYCIN HCL) one by mouth three times a day  #21 x 0   Entered and Authorized by:   D. Thomos Lemons DO   Signed by:   D. Thomos Lemons DO on 03/02/2010   Method used:   Electronically to        [facility] # [phone]* (retail)  [address] [place], [place]  [address]       Ph: [phone]       Fax: [phone]   RxID:   [id number]    Orders Added: 1)  CT without Contrast [CT w/o contrast] 2)  Est. Patient Level III [84132]

## 2010-04-09 ENCOUNTER — Encounter: Payer: Self-pay | Admitting: Internal Medicine

## 2010-04-17 ENCOUNTER — Other Ambulatory Visit (HOSPITAL_COMMUNITY): Payer: Self-pay | Admitting: Internal Medicine

## 2010-04-17 DIAGNOSIS — E0590 Thyrotoxicosis, unspecified without thyrotoxic crisis or storm: Secondary | ICD-10-CM

## 2010-04-21 NOTE — Letter (Signed)
Summary: [facility]   Imported By: Maryln Gottron 04/17/2010 15:52:00  _____________________________________________________________________  External Attachment:    Type:   Image     Comment:   External Document

## 2010-05-05 ENCOUNTER — Ambulatory Visit (HOSPITAL_COMMUNITY): Payer: Self-pay

## 2010-05-06 ENCOUNTER — Other Ambulatory Visit (HOSPITAL_COMMUNITY): Payer: Self-pay

## 2010-05-18 LAB — POCT CARDIAC MARKERS
CKMB, poc: <1 ng/mL — ABNORMAL LOW (ref 1.0–8.0)
CKMB, poc: <1 ng/mL — ABNORMAL LOW (ref 1.0–8.0)
Myoglobin, poc: 31.8 ng/mL (ref 12–200)
Myoglobin, poc: 58.9 ng/mL (ref 12–200)
Troponin i, poc: <0.05 ng/mL (ref 0.00–0.09)
Troponin i, poc: <0.05 ng/mL (ref 0.00–0.09)

## 2010-05-18 LAB — DIFFERENTIAL
Basophils Absolute: 0 10*3/uL (ref 0.0–0.1)
Basophils Relative: 1 % (ref 0–1)
Eosinophils Absolute: 0.2 10*3/uL (ref 0.0–0.7)
Eosinophils Relative: 2 % (ref 0–5)
Lymphocytes Relative: 27 % (ref 12–46)
Lymphs Abs: 2.6 10*3/uL (ref 0.7–4.0)
Monocytes Absolute: 0.7 10*3/uL (ref 0.1–1.0)
Monocytes Relative: 7 % (ref 3–12)
Neutro Abs: 6.3 10*3/uL (ref 1.7–7.7)
Neutrophils Relative %: 64 % (ref 43–77)

## 2010-05-18 LAB — CBC
HCT: 46.3 % (ref 39.0–52.0)
Hemoglobin: 16.2 g/dL (ref 13.0–17.0)
MCHC: 34.9 g/dL (ref 30.0–36.0)
MCV: 88.6 fL (ref 78.0–100.0)
Platelets: 228 10*3/uL (ref 150–400)
RBC: 5.22 MIL/uL (ref 4.22–5.81)
RDW: 12.8 % (ref 11.5–15.5)
WBC: 9.8 10*3/uL (ref 4.0–10.5)

## 2010-05-18 LAB — BASIC METABOLIC PANEL
BUN: 11 mg/dL (ref 6–23)
CO2: 27 mEq/L (ref 19–32)
Calcium: 9.1 mg/dL (ref 8.4–10.5)
Chloride: 103 mEq/L (ref 96–112)
Creatinine, Ser: 0.8 mg/dL (ref 0.4–1.5)
GFR calc Af Amer: >60 mL/min (ref >60)
GFR calc non Af Amer: >60 mL/min (ref >60)
Glucose, Bld: 129 mg/dL — ABNORMAL HIGH (ref 70–99)
Potassium: 3.6 mEq/L (ref 3.5–5.1)
Sodium: 143 mEq/L (ref 135–145)

## 2010-05-24 ENCOUNTER — Other Ambulatory Visit: Payer: Self-pay | Admitting: Family

## 2010-06-26 ENCOUNTER — Ambulatory Visit (HOSPITAL_BASED_OUTPATIENT_CLINIC_OR_DEPARTMENT_OTHER)
Admission: RE | Admit: 2010-06-26 | Discharge: 2010-06-26 | Disposition: A | Discharge status: Home / Self Care | Payer: Managed Care, Other (non HMO) | Source: Ambulatory Visit | Attending: Internal Medicine | Admitting: Internal Medicine

## 2010-06-26 ENCOUNTER — Encounter: Payer: Self-pay | Admitting: Internal Medicine

## 2010-06-26 ENCOUNTER — Ambulatory Visit (INDEPENDENT_AMBULATORY_CARE_PROVIDER_SITE_OTHER): Payer: Managed Care, Other (non HMO) | Admitting: Internal Medicine

## 2010-06-26 ENCOUNTER — Telehealth: Payer: Self-pay | Admitting: *Deleted

## 2010-06-26 VITALS — BP 122/80 | HR 105 | Temp 98.5°F | Resp 20 | Wt 192.0 lb

## 2010-06-26 DIAGNOSIS — E291 Testicular hypofunction: Secondary | ICD-10-CM

## 2010-06-26 DIAGNOSIS — R0602 Shortness of breath: Secondary | ICD-10-CM

## 2010-06-26 LAB — CBC
MCV: 84.4 fL (ref 78.0–100.0)
Platelets: 228 10*3/uL (ref 150–400)
RBC: 5.46 MIL/uL (ref 4.22–5.81)
RDW: 12.8 % (ref 11.5–15.5)
WBC: 9.7 10*3/uL (ref 4.0–10.5)

## 2010-06-26 LAB — BASIC METABOLIC PANEL WITH GFR
CO2: 25 mEq/L (ref 19–32)
Calcium: 9.6 mg/dL (ref 8.4–10.5)
Creat: 0.95 mg/dL (ref 0.40–1.50)
GFR, Est African American: >60 mL/min (ref >60)
GFR, Est Non African American: >60 mL/min (ref >60)
Sodium: 139 mEq/L (ref 135–145)

## 2010-06-26 NOTE — Op Note (Signed)
[facility]  Patient:    Cesar Woods, Cesar Woods                     MRN: [id number] Proc. Date: 03/14/00 Adm. Date:  [id number] Disc. Date: [id number] Attending:  Thermon Woods CC:         Cesar Woods, M.D.   Operative Report  PREOPERATIVE DIAGNOSES:  1. Left testicular tumor.  2. History of right testicular neoplasm.  POSTOPERATIVE DIAGNOSES:  1. Left testicular tumor.  2. History of right testicular neoplasm.  PROCEDURE:  Left inguinal exploration with left radical orchiectomy and insertion of testicular prosthesis.  SURGEON:  Dr. Isabel Woods.  ASSISTANT:  Dr. Brunilda Woods.  ANESTHESIA:  General.  INDICATIONS FOR PROCEDURE:  Mr. Cesar Woods is a 45 year old male. He has a complex urologic history. Approximately four years ago, he noted a right sided testicular mass while living in [place]. An ultrasound apparently confirmed the presence of a testicular tumor on the right side and also apparently a very small focal abnormality in his left testicle. The patient underwent a right sided radical orchiectomy. He also had a left inguinal exploration with what he describes as a partial orchiectomy on the left. He reports that his tumor markers were elevated initially but he does not recall whether this was Beta HCG, alpha fetaprotein or both of these markers. He reports CT scan showed no metastatic disease and observation was recommended to him although he was not really given much in the way of a discussion about his options. He states that he never had any follow-up CT scans but apparently markers were followed and they remained unremarkable. He subsequently moved back to the [place] and became a patient of Dr. Danny Woods. He continued to have marker studies which were unremarkable and also had an abdominal pelvic CT scan which was unremarkable. Several months ago, the patient began noticing some painless enlargement of his left testicle.  An ultrasound was ordered by Dr. Catha Woods and this showed really no normal testicular parenchyma. The patient had clear abnormalities in his ultrasound which were suggestive of recurrent neoplasm of the left testicle. His tumor markers which had been zero did start to show a mild elevation with a mildly elevated Beta HCG level. The patient has had recent abdominal, pelvic, and head CT scans all of which were negative. He had a comprehensive semen analysis which showed aspermia.  The patient presents now for left radical orchiectomy and insertion of testicular prosthesis.  TECHNIQUE AND FINDINGS:  The patient was brought to the operating room where he had successful induction of general endotracheal anesthesia. He was placed in the supine position and prepped and draped in the usual manner. We elected to use his old left inguinal incision. Some of the scar was resected. The incision was carried down to the level of the external oblique fascia. There the external ring was found to be open and the spermatic cord was easily identified. Scarring was relatively minimal. We were able to open the remaining portion of the external oblique fascia. The ilioinguinal nerve was identified and preserved. We were able to encircle the entire spermatic cord near the level of the pubic tubercle. We opened up the external oblique fascia all the way to the internal ring. The most proximal aspect of the spermatic cord was clamped. I did not feel that open biopsy or exploration was really necessary and given the ultrasound findings as well as his history and the elevated HCG level, I  felt that an orchiectomy was going to be required regardless of the operative findings. The scrotum was everted and the testicle was sharply and bluntly dissected off the undersurface of the scrotum with transection of the gubernacular fibers. The testicle was examined and again essentially replaced by mass. The spermatic cord showed  some congested and dilated veins. It was fairly thickened in the distal aspect but fairly normal proximally. The entire spermatic cord and testicle were then taken. The most proximal aspect of the spermatic cord was suture ligated with #0 silk and a second #0 silk suture was also used for double ligation. The entire specimen was then removed. The silk sutures were left long in case identification of the spermatic cord ______ is necessary in the future. The wound was copiously irrigated with antibiotic solution. The scrotum was then everted and a Vicryl suture was placed in the dependent portion of the scrotum. This was then fixed to a #3 testicular prosthesis which was then put in the scrotum. I used a pursestring suture of Vicryl to close off the entry into the scrotum from the inguinal canal. We again irrigated. The external oblique was then reapproximated with 2-0 Vicryl. The ilioinguinal nerve was again identified and preserved. The subcutaneous tissues were reapproximated and the skin was closed in a subcuticular manner. The patient appeared to tolerate the procedure well and there were no obvious complications. DD:  03/14/00 TD:  03/16/00 Job: [id number]

## 2010-06-26 NOTE — Patient Instructions (Signed)
Our office will contact you re:  Test results 

## 2010-06-26 NOTE — Assessment & Plan Note (Signed)
45 y/o male complains of progressive SOB x 2 weeks. EKG shows sinus tachycardia at 101 bpm.  Check CBC, BNP Rule out PE and other pulm cause - chest CT of chest PE protocol

## 2010-06-26 NOTE — Telephone Encounter (Signed)
[place] with [facility] lab called with normal D-dimer result of 0.37.

## 2010-06-26 NOTE — Telephone Encounter (Signed)
Received call from [place] with [facility] letting us know that all STAT results were now final EXCEPT the BNP (still pending). All results are in EPIC.

## 2010-06-26 NOTE — Progress Notes (Signed)
  Subjective:    Patient ID: Cesar Woods, male    DOB: Nov 10, 1965, 45 y.o.   MRN: [id number]  HPI 45 y/o white male with hx of testicular cancer for follow up.    2 wks ago - he noticed progressive shortness of breath.  He feels like he can't take a deep breath.  He notes pressure like sensation near diaphragm area.  He also reports mild intermittent nausea.  He travels frequently to [place], [place] and [place].  He denies leg swelling or redness.   Review of Systems No new medication.  Mild tingling sensation in his hands.    Past Medical History  Diagnosis Date  . Hyperlipidemia   . Hypertension   . GERD (gastroesophageal reflux disease)   . Atypical chest pain     negative cardiac work up 2010  . Testicle cancer     followed by Dr Isabel Caprice  . History of migraines   . Blood pressure elevated without history of HTN   . Food allergy     anaphylaxis reaction with shell fish    History   Social History  . Marital Status: Divorced    Spouse Name: N/A    Number of Children: N/A  . Years of Education: N/A   Occupational History  . Not on file.   Social History Main Topics  . Smoking status: Former Games developer  . Smokeless tobacco: Not on file  . Alcohol Use: Not on file  . Drug Use: Not on file  . Sexually Active: Not on file   Other Topics Concern  . 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 Orchiectomy     and radiation for testicular cancer    Family History  Problem Relation Age of Onset  . Coronary artery disease    . Other Father     CABG at agae 75  . Hypertension    . Hyperlipidemia      Allergies  Allergen Reactions  . Aspirin   . Iodine     REACTION: Reaction not known  . Penicillins     Current Outpatient Prescriptions on File Prior to Visit  Medication Sig Dispense Refill  . omeprazole-sodium  bicarbonate (ZEGERID) 40-1100 MG per capsule TAKE ONE CAPSULE BY MOUTH ONCE A DAY  30 capsule  1    BP 122/80  Pulse 105  Temp(Src) 98.5 F (36.9 C) (Oral)  Resp 20  Wt 192 lb (87.091 kg)  SpO2 99%    Objective:   Physical Exam    Constitutional: Appears well-developed and well-nourished. No distress.  Neck: Normal range of motion. Neck supple. No thyromegaly present.  No  Carotid bruit. Cardiovascular: tachycardia, regular rhythm and normal heart sounds.  Exam reveals no gallop and no friction rub.  No murmur heard. Pulmonary/Chest: Effort normal and breath sounds normal.  No wheezes. No rales.  Abdominal: Soft. Bowel sounds are normal. No mass.  mild epigastric tenderness Neurological: Alert. No cranial nerve deficit.  Skin: Skin is warm and dry. No lower ext edema or calf tenderness Psychiatric:  Appears anxious  Assessment & Plan:

## 2010-06-29 LAB — TESTOSTERONE, FREE, TOTAL, SHBG
Sex Hormone Binding: 19 nmol/L (ref 13–71)
Testosterone, Free: 31.3 pg/mL — ABNORMAL LOW (ref 47.0–244.0)
Testosterone-% Free: 2.5 % (ref 1.6–2.9)
Testosterone: 127.09 ng/dL — ABNORMAL LOW (ref 250–890)

## 2010-07-24 ENCOUNTER — Other Ambulatory Visit: Payer: Self-pay | Admitting: Family

## 2010-09-21 ENCOUNTER — Other Ambulatory Visit: Payer: Self-pay | Admitting: Internal Medicine

## 2010-10-16 ENCOUNTER — Other Ambulatory Visit (HOSPITAL_COMMUNITY): Payer: Self-pay | Admitting: Internal Medicine

## 2010-10-16 DIAGNOSIS — E042 Nontoxic multinodular goiter: Secondary | ICD-10-CM

## 2010-10-16 DIAGNOSIS — E0590 Thyrotoxicosis, unspecified without thyrotoxic crisis or storm: Secondary | ICD-10-CM

## 2010-11-03 ENCOUNTER — Ambulatory Visit (HOSPITAL_COMMUNITY): Payer: Managed Care, Other (non HMO)

## 2010-11-04 ENCOUNTER — Other Ambulatory Visit (HOSPITAL_COMMUNITY): Payer: Managed Care, Other (non HMO)

## 2010-11-11 ENCOUNTER — Ambulatory Visit (HOSPITAL_COMMUNITY): Payer: Managed Care, Other (non HMO)

## 2010-11-12 ENCOUNTER — Other Ambulatory Visit (HOSPITAL_COMMUNITY): Payer: Managed Care, Other (non HMO)

## 2010-11-20 ENCOUNTER — Other Ambulatory Visit: Payer: Self-pay | Admitting: Family

## 2010-11-23 NOTE — Telephone Encounter (Signed)
rx sent in electronically 

## 2011-02-26 ENCOUNTER — Emergency Department (HOSPITAL_COMMUNITY)
Admission: EM | Admit: 2011-02-26 | Discharge: 2011-02-26 | Disposition: A | Discharge status: Home / Self Care | Payer: Managed Care, Other (non HMO) | Attending: Emergency Medicine | Admitting: Emergency Medicine

## 2011-02-26 ENCOUNTER — Encounter (HOSPITAL_COMMUNITY): Payer: Self-pay

## 2011-02-26 DIAGNOSIS — Z760 Encounter for issue of repeat prescription: Secondary | ICD-10-CM

## 2011-02-26 DIAGNOSIS — Z8547 Personal history of malignant neoplasm of testis: Secondary | ICD-10-CM | POA: Insufficient documentation

## 2011-02-26 MED ORDER — TESTOSTERONE 40.5 MG/2.5GM (1.62%) TD GEL: 5.0000 [IU] | Freq: Every day | TRANSDERMAL | Status: DC

## 2011-02-26 MED ORDER — TESTOSTERONE 40.5 MG/2.5GM (1.62%) TD GEL: 6.0000 [IU] | Freq: Once | TRANSDERMAL | Status: DC

## 2011-02-26 NOTE — ED Provider Notes (Signed)
History     CSN: [id number]  Arrival date & time 02/26/11  1844   First MD Initiated Contact with Patient 02/26/11 1859      Chief Complaint  Patient presents with  . Medication Refill    androgel rx needed.  leaving town and won't have enough for the trip while gone.    (Consider location/radiation/quality/duration/timing/severity/associated sxs/prior treatment) HPI Comments: Patient with history of testicular cancer with bilateral orchectomy approximately 10 years ago. Patient is followed by Dr. Isabel Caprice. She has been on testosterone since his surgery. Patient states that he is leaving the country for a week and is out of his testosterone. She was scheduled to followup with his urologist in one week but rescheduled the appointment because of his trip. No medical complaints.  The history is provided by the patient.    Past Medical History  Diagnosis Date  . Hyperlipidemia   . Hypertension   . GERD (gastroesophageal reflux disease)   . Atypical chest pain     negative cardiac work up 2010  . Testicle cancer     followed by Dr Isabel Caprice  . History of migraines   . Blood pressure elevated without history of HTN   . Food allergy     anaphylaxis reaction with shell fish    Past Surgical History  Procedure Date  . Orchiectomy     and radiation for testicular cancer    Family History  Problem Relation Age of Onset  . Coronary artery disease    . Other Father     CABG at agae 44  . Hypertension    . Hyperlipidemia      History  Substance Use Topics  . Smoking status: Former Games developer  . Smokeless tobacco: Not on file  . Alcohol Use: Yes     occasionally      Review of Systems  Constitutional: Negative for fever.  HENT: Negative for sore throat and rhinorrhea.   Respiratory: Negative for cough.   Gastrointestinal: Negative for nausea, vomiting and abdominal pain.  Skin: Negative for rash.  Neurological: Negative for headaches.    Allergies  Aspirin; Iodine;  Penicillins; and Shellfish allergy  Home Medications   Current Outpatient Rx  Name Route Sig Dispense Refill  . OMEPRAZOLE 20 MG PO CPDR Oral Take 20 mg by mouth daily.    . TESTOSTERONE 20.25 MG/ACT (1.62%) TD GEL Transdermal Place onto the skin. Apply 4 pumps to skin once daily     . TESTOSTERONE 40.5 MG/2.5GM (1.62%) TD GEL Transdermal Place 5 Units onto the skin daily. 225 g 0    BP 170/109  Pulse 94  Temp(Src) 98.4 F (36.9 C) (Oral)  Resp 20  SpO2 98%  Physical Exam  Nursing note and vitals reviewed. Constitutional: He is oriented to person, place, and time. He appears well-developed and well-nourished.  HENT:  Head: Normocephalic and atraumatic.  Eyes: Right eye exhibits no discharge. Left eye exhibits no discharge.  Neck: Normal range of motion. Neck supple.  Neurological: He is alert and oriented to person, place, and time.  Skin: Skin is warm and dry.  Psychiatric: He has a normal mood and affect.    ED Course  Procedures (including critical care time)  Labs Reviewed - No data to display No results found.   1. Medication refill    7:38 PM patient seen and examined. He has brought a copy of his prescription with him. Will refill prescription.  7:38 PM Patient was informed of high blood  pressure reading today.  was urged to follow-up with a primary care doctor in the next month for a blood pressure recheck.  Patient verbalized understanding.    MDM  Medication refilled. Pt seems reliable. He has a copy of his current prescription.        Eustace Moore Metcalf, [place] 02/26/11 1939

## 2011-03-08 NOTE — ED Provider Notes (Signed)
Evaluation and management procedures were performed by the PA/NP under my supervision/collaboration.    Aimi Essner D Belford Pascucci, MD 03/08/11 2030 

## 2011-05-10 ENCOUNTER — Other Ambulatory Visit: Payer: Self-pay | Admitting: Internal Medicine

## 2011-05-10 DIAGNOSIS — E042 Nontoxic multinodular goiter: Secondary | ICD-10-CM

## 2011-05-18 ENCOUNTER — Other Ambulatory Visit: Payer: Managed Care, Other (non HMO)

## 2011-05-31 ENCOUNTER — Ambulatory Visit
Admission: RE | Admit: 2011-05-31 | Discharge: 2011-05-31 | Disposition: A | Discharge status: Home / Self Care | Payer: Managed Care, Other (non HMO) | Source: Ambulatory Visit | Attending: Internal Medicine | Admitting: Internal Medicine

## 2011-05-31 DIAGNOSIS — E042 Nontoxic multinodular goiter: Secondary | ICD-10-CM

## 2011-06-09 ENCOUNTER — Telehealth: Payer: Self-pay | Admitting: Internal Medicine

## 2011-06-09 MED ORDER — EPINEPHRINE 0.3 MG/0.3ML IJ DEVI: 0.3000 mg | Freq: Once | INTRAMUSCULAR | Status: DC

## 2011-06-09 NOTE — Telephone Encounter (Signed)
rx sent in electronically, pt needs OV before anymore refills

## 2011-06-09 NOTE — Telephone Encounter (Signed)
Refill- Epipen 0.3mg  auto-inj 2-pak. Use as needed for allergic reaction as directed qty 2.0 ea. Last fill 1.13.12

## 2011-08-25 ENCOUNTER — Ambulatory Visit (INDEPENDENT_AMBULATORY_CARE_PROVIDER_SITE_OTHER): Payer: Managed Care, Other (non HMO) | Admitting: Family Medicine

## 2011-08-25 ENCOUNTER — Encounter: Payer: Self-pay | Admitting: Family Medicine

## 2011-08-25 VITALS — BP 152/115 | Temp 98.3°F | Wt 201.0 lb

## 2011-08-25 DIAGNOSIS — E291 Testicular hypofunction: Secondary | ICD-10-CM

## 2011-08-25 DIAGNOSIS — I10 Essential (primary) hypertension: Secondary | ICD-10-CM

## 2011-08-25 DIAGNOSIS — N62 Hypertrophy of breast: Secondary | ICD-10-CM

## 2011-08-25 MED ORDER — LISINOPRIL 10 MG PO TABS: 10.0000 mg | ORAL_TABLET | Freq: Every day | ORAL | Status: DC

## 2011-08-25 NOTE — Patient Instructions (Addendum)

## 2011-08-25 NOTE — Progress Notes (Signed)
  Subjective:    Patient ID: Cesar Woods, male    DOB: [birth year], 46 y.o.   MRN: [id number]  HPI  Patient seen with mild bilateral breast discomfort and possibly some mild gynecomastia noted over the past week. No breast discharge. No adenopathy. Patient concerned because he had testicular cancer about 12 years ago. 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 Takes AndroGel 5  sprays of 1.62% daily. Followed by urology. Denies any appetite or weight changes. No headaches. No cough. No dyspnea.  Patient never diagnosed with hypertension. Strong family history hypertension in parents. Former smoker. No recent chest pains. No dizziness.  Past Medical History  Diagnosis Date  . Hyperlipidemia   . Hypertension   . GERD (gastroesophageal reflux disease)   . Atypical chest pain     negative cardiac work up 2010  . Testicle cancer     followed by Dr Isabel Caprice  . History of migraines   . Blood pressure elevated without history of HTN   . Food allergy     anaphylaxis reaction with shell fish   Past Surgical History  Procedure Date  . Orchiectomy     and radiation for testicular cancer    reports that he has quit smoking. He does not have any smokeless tobacco history on file. He reports that he drinks alcohol. He reports that he does not use illicit drugs. family history includes Coronary artery disease in an unspecified family member; Hyperlipidemia in an unspecified family member; Hypertension in an unspecified family member; and Other in his father. Allergies  Allergen Reactions  . Aspirin Other (See Comments)    unknown  . Iodine     REACTION: Reaction not known  . Penicillins Other (See Comments)    unknown  . Shellfish Allergy       Review of Systems  Constitutional: Negative for fever, chills, appetite change, fatigue and unexpected weight change.  Eyes: Negative for visual disturbance.  Respiratory: Negative for cough, chest tightness and shortness of breath.    Cardiovascular: Negative for chest pain, palpitations and leg swelling.  Gastrointestinal: Negative for abdominal pain.  Neurological: Negative for dizziness, syncope, weakness, light-headedness and headaches.  Hematological: Negative for adenopathy.       Objective:   Physical Exam  Constitutional: He appears well-developed and well-nourished.  Neck: Neck supple.  Cardiovascular: Normal rate and regular rhythm.   Pulmonary/Chest: Effort normal and breath sounds normal. No respiratory distress. He has no wheezes. He has no rales.       Patient has slightly prominent breast tissue bilaterally but no tenderness no erythema. No nipple discharge.  No mass  Musculoskeletal: He exhibits no edema.  Lymphadenopathy:    He has no cervical adenopathy.          Assessment & Plan:  #1 mild bilateral gynecomastia. Possibly related to androgen supplementation. Recheck total testosterone levels. It out of range confer with his urologist #2 severe elevated blood pressure, especially diastolic. Initiate lisinopril 10 mg once daily. Follow up with primary in 3 weeks to reassess. Reviewed possible side effects

## 2011-08-26 ENCOUNTER — Other Ambulatory Visit (INDEPENDENT_AMBULATORY_CARE_PROVIDER_SITE_OTHER): Payer: Managed Care, Other (non HMO)

## 2011-08-26 DIAGNOSIS — E291 Testicular hypofunction: Secondary | ICD-10-CM

## 2011-08-26 LAB — TESTOSTERONE: Testosterone: 530.58 ng/dL (ref 350.00–890.00)

## 2011-09-14 ENCOUNTER — Ambulatory Visit: Payer: Managed Care, Other (non HMO) | Admitting: Internal Medicine

## 2011-09-14 DIAGNOSIS — Z0289 Encounter for other administrative examinations: Secondary | ICD-10-CM

## 2011-09-21 ENCOUNTER — Ambulatory Visit (INDEPENDENT_AMBULATORY_CARE_PROVIDER_SITE_OTHER): Payer: Managed Care, Other (non HMO) | Admitting: Internal Medicine

## 2011-09-21 ENCOUNTER — Encounter: Payer: Self-pay | Admitting: Internal Medicine

## 2011-09-21 VITALS — BP 154/112 | HR 84 | Temp 98.0°F | Wt 202.0 lb

## 2011-09-21 DIAGNOSIS — I10 Essential (primary) hypertension: Secondary | ICD-10-CM

## 2011-09-21 MED ORDER — AMLODIPINE BESYLATE 2.5 MG PO TABS: 2.5000 mg | ORAL_TABLET | Freq: Every day | ORAL | Status: DC

## 2011-09-21 NOTE — Progress Notes (Signed)
Subjective:    Patient ID: Cesar Woods, male    DOB: Apr 20, 1965, 46 y.o.   MRN: [id number]  HPI  46 year old white male with history of hypogonadism and hypertension for followup. He was seen 3 weeks ago with complaints of gynecomastia and hypertension. He was started on lisinopril 10 mg once daily. His blood pressure has started to improve. Home blood pressure readings 140/90. He denies any side effects from lisinopril.  He denies any use of over-the-counter herbal supplements, pain medications or decongestants.   Review of Systems Negative for chest pain or shortness of breath    Past Medical History  Diagnosis Date  . Hyperlipidemia   . Hypertension   . GERD (gastroesophageal reflux disease)   . Atypical chest pain     negative cardiac work up 2010  . Testicle cancer     followed by Dr Isabel Caprice  . History of migraines   . Blood pressure elevated without history of HTN   . Food allergy     anaphylaxis reaction with shell fish    History   Social History  . Marital Status: Divorced    Spouse Name: N/A    Number of Children: N/A  . Years of Education: N/A   Occupational History  . Not on file.   Social History Main Topics  . Smoking status: Former Games developer  . Smokeless tobacco: Not on file  . Alcohol Use: Yes     occasionally  . Drug Use: No  . Sexually Active: Not on file   Other Topics Concern  . 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 Orchiectomy     and radiation for testicular cancer    Family History  Problem Relation Age of Onset  . Coronary artery disease    . Other Father     CABG at agae 21  . Hypertension    . Hyperlipidemia      Allergies  Allergen Reactions  . Aspirin Other (See Comments)    unknown  . Iodine     REACTION: Reaction not known  . Penicillins Other (See Comments)    unknown  .  Shellfish Allergy     Current Outpatient Prescriptions on File Prior to Visit  Medication Sig Dispense Refill  . EPINEPHrine (EPI-PEN) 0.3 mg/0.3 mL DEVI Inject 0.3 mLs (0.3 mg total) into the muscle once.  1 Device  0  . lisinopril (PRINIVIL,ZESTRIL) 10 MG tablet Take 1 tablet (10 mg total) by mouth daily.  30 tablet  3  . omeprazole (PRILOSEC) 20 MG capsule Take 20 mg by mouth daily.      . Testosterone (ANDROGEL) 40.5 MG/2.5GM (1.62%) GEL Place 5 Units onto the skin daily.  225 g  0  . amLODipine (NORVASC) 2.5 MG tablet Take 1 tablet (2.5 mg total) by mouth daily.  30 tablet  2  . DISCONTD: Testosterone (ANDROGEL PUMP) 20.25 MG/ACT (1.62%) GEL Place onto the skin. Apply 4 pumps to skin once daily         BP 154/112  Pulse 84  Temp 98 F (36.7 C) (Oral)  Wt 202 lb (91.627 kg)    Objective:   Physical Exam   Constitutional: Appears well-developed and well-nourished. No distress.  Neck: No carotid bruit Cardiovascular: Normal rate, regular rhythm and normal heart sounds.   Pulmonary/Chest:  Effort normal and breath sounds normal.  No wheezes. No rales.  Skin: Skin is warm and dry.  Psychiatric: Normal mood and affect. Behavior is normal.       Assessment & Plan:

## 2011-09-21 NOTE — Assessment & Plan Note (Signed)
Good response to lisinopril 10 mg once daily. Add amlodipine 2.5 mg. Reassess in 4-6 weeks. Monitor electrolytes and kidney function.  BP: 154/112 mmHg

## 2011-09-22 LAB — BASIC METABOLIC PANEL
Calcium: 8.9 mg/dL (ref 8.4–10.5)
GFR: 93.25 mL/min (ref >60.00)
Sodium: 140 mEq/L (ref 135–145)

## 2011-12-19 ENCOUNTER — Other Ambulatory Visit: Payer: Self-pay | Admitting: Internal Medicine

## 2011-12-19 ENCOUNTER — Other Ambulatory Visit: Payer: Self-pay | Admitting: Family Medicine

## 2012-02-22 ENCOUNTER — Other Ambulatory Visit: Payer: Self-pay | Admitting: Internal Medicine

## 2012-03-01 ENCOUNTER — Ambulatory Visit (INDEPENDENT_AMBULATORY_CARE_PROVIDER_SITE_OTHER): Payer: BC Managed Care – PPO | Admitting: Internal Medicine

## 2012-03-01 ENCOUNTER — Encounter: Payer: Self-pay | Admitting: Internal Medicine

## 2012-03-01 VITALS — BP 130/80 | Temp 98.5°F | Wt 203.0 lb

## 2012-03-01 DIAGNOSIS — R059 Cough, unspecified: Secondary | ICD-10-CM

## 2012-03-01 DIAGNOSIS — I10 Essential (primary) hypertension: Secondary | ICD-10-CM

## 2012-03-01 DIAGNOSIS — R05 Cough: Secondary | ICD-10-CM | POA: Insufficient documentation

## 2012-03-01 MED ORDER — LOSARTAN POTASSIUM 50 MG PO TABS: 50.0000 mg | ORAL_TABLET | Freq: Every day | ORAL | Status: DC

## 2012-03-01 NOTE — Patient Instructions (Addendum)
Please call our office if your symptoms do not improve or gets worse. Use nasal saline spray as directed

## 2012-03-01 NOTE — Assessment & Plan Note (Signed)
Patient experiencing dry cough since late December of 2013. Symptoms may be secondary to resolving viral upper respiratory infection versus ACE inhibitor cough. I suggest he switch lisinopril to losartan. Reassess in 1 month.  Use intranasal saline as directed.

## 2012-03-01 NOTE — Progress Notes (Signed)
Subjective:    Patient ID: Cesar Woods, male    DOB: 10-23-1965, 47 y.o.   MRN: [id number]  HPI  47 year old white male with history of testicular cancer and hypertension complains of cough since Christmas of 2013. He reports visiting his father over the holidays. His father was ill with upper respiratory illness. Patient reports feeling sick for 3-4 days after he got home. He experienced runny nose and sore throat. Now he has dry cough. He also complains of dry sensation in his throat. He denies any wheezing.  Patient start an ACE inhibitor for hypertension 2 months ago.  Review of Systems Negative for fever or chills  Past Medical History  Diagnosis Date  . Hyperlipidemia   . Hypertension   . GERD (gastroesophageal reflux disease)   . Atypical chest pain     negative cardiac work up 2010  . Testicle cancer     followed by Dr Isabel Caprice  . History of migraines   . Blood pressure elevated without history of HTN   . Food allergy     anaphylaxis reaction with shell fish    History   Social History  . Marital Status: Divorced    Spouse Name: N/A    Number of Children: N/A  . Years of Education: N/A   Occupational History  . Not on file.   Social History Main Topics  . Smoking status: Former Games developer  . Smokeless tobacco: Not on file  . Alcohol Use: Yes     Comment: occasionally  . Drug Use: No  . Sexually Active: Not on file   Other Topics Concern  . 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 Orchiectomy     and radiation for testicular cancer    Family History  Problem Relation Age of Onset  . Coronary artery disease    . Other Father     CABG at agae 50  . Hypertension    . Hyperlipidemia      Allergies  Allergen Reactions  . Aspirin Other (See Comments)    unknown  . Iodine     REACTION: Reaction not known  .  Penicillins Other (See Comments)    unknown  . Shellfish Allergy     Current Outpatient Prescriptions on File Prior to Visit  Medication Sig Dispense Refill  . amLODipine (NORVASC) 2.5 MG tablet TAKE 1 TABLET BY MOUTH EVERY DAY  30 tablet  1  . EPINEPHrine (EPI-PEN) 0.3 mg/0.3 mL DEVI Inject 0.3 mLs (0.3 mg total) into the muscle once.  1 Device  0  . omeprazole (PRILOSEC) 20 MG capsule Take 20 mg by mouth daily.      . Testosterone (ANDROGEL) 40.5 MG/2.5GM (1.62%) GEL Place 5 Units onto the skin daily.  225 g  0  . losartan (COZAAR) 50 MG tablet Take 1 tablet (50 mg total) by mouth daily.  30 tablet  3    BP 130/80  Temp 98.5 F (36.9 C) (Oral)  Wt 203 lb (92.08 kg)       Objective:   Physical Exam  Constitutional: He is oriented to person, place, and time. He appears well-developed and well-nourished.  HENT:  Head: Normocephalic and atraumatic.  Mouth/Throat: Oropharynx is clear and moist.  Neck: Neck supple.       No neck tenderness  Cardiovascular: Normal rate, regular rhythm and normal heart sounds.   Pulmonary/Chest: Effort normal and breath sounds normal. He has no wheezes.  Neurological: He is alert and oriented to person, place, and time.          Assessment & Plan:

## 2012-03-02 LAB — BASIC METABOLIC PANEL
Chloride: 104 mEq/L (ref 96–112)
GFR: 112.34 mL/min (ref >60.00)
Potassium: 4.1 mEq/L (ref 3.5–5.1)
Sodium: 140 mEq/L (ref 135–145)

## 2012-03-31 ENCOUNTER — Encounter: Payer: Self-pay | Admitting: Internal Medicine

## 2012-03-31 ENCOUNTER — Ambulatory Visit (INDEPENDENT_AMBULATORY_CARE_PROVIDER_SITE_OTHER): Payer: BC Managed Care – PPO | Admitting: Internal Medicine

## 2012-03-31 VITALS — BP 134/88 | HR 76 | Temp 98.1°F | Wt 207.0 lb

## 2012-03-31 DIAGNOSIS — H6120 Impacted cerumen, unspecified ear: Secondary | ICD-10-CM

## 2012-03-31 MED ORDER — AMLODIPINE BESYLATE 2.5 MG PO TABS: 2.5000 mg | ORAL_TABLET | Freq: Every day | ORAL | Status: DC

## 2012-03-31 MED ORDER — LOSARTAN POTASSIUM 50 MG PO TABS: 50.0000 mg | ORAL_TABLET | Freq: Every day | ORAL | Status: DC

## 2012-03-31 NOTE — Assessment & Plan Note (Signed)
Patient likely had ACE inhibitor cough. The results with switching to losartan. Maintain current medication regimen. BP: 134/88 mmHg

## 2012-03-31 NOTE — Assessment & Plan Note (Signed)
Patient with bilateral cerumen impaction. Both external auditory canals irrigated to remove cerumen plugs. Patient tolerated well. No complications.

## 2012-03-31 NOTE — Progress Notes (Signed)
Subjective:    Patient ID: Cesar Woods, male    DOB: [birth year], 47 y.o.   MRN: [id number]  HPI  47 year old white male previously seen for chronic dry cough for followup. Since switching his ACE inhibitor to losartan, patient reports cough has significantly improved. He denies any side effects from ARB. His blood pressure is well-controlled.  He complains of cerumen impaction in both ears. He's been using over-the-counter ear wax softeners  Review of Systems Negative for chest pain or shortness of breath  Past Medical History  Diagnosis Date  . Hyperlipidemia   . Hypertension   . GERD (gastroesophageal reflux disease)   . Atypical chest pain     negative cardiac work up 2010  . Testicle cancer     followed by Dr Isabel Caprice  . History of migraines   . Blood pressure elevated without history of HTN   . Food allergy     anaphylaxis reaction with shell fish    History   Social History  . Marital Status: Divorced    Spouse Name: N/A    Number of Children: N/A  . Years of Education: N/A   Occupational History  . Not on file.   Social History Main Topics  . Smoking status: Former Games developer  . Smokeless tobacco: Not on file  . Alcohol Use: Yes     Comment: occasionally  . Drug Use: No  . Sexually Active: Not on file   Other Topics Concern  . 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 Orchiectomy      and radiation for testicular cancer    Family History  Problem Relation Age of Onset  . Coronary artery disease    . Other Father     CABG at agae 90  . Hypertension    . Hyperlipidemia      Allergies  Allergen Reactions  . Aspirin Other (See Comments)    unknown  . Iodine     REACTION: Reaction not known  . Penicillins Other (See Comments)    unknown  . Shellfish Allergy     Current  Outpatient Prescriptions on File Prior to Visit  Medication Sig Dispense Refill  . EPINEPHrine (EPI-PEN) 0.3 mg/0.3 mL DEVI Inject 0.3 mLs (0.3 mg total) into the muscle once.  1 Device  0  . omeprazole (PRILOSEC) 20 MG capsule Take 20 mg by mouth daily.      . Testosterone (ANDROGEL) 40.5 MG/2.5GM (1.62%) GEL Place 5 Units onto the skin daily.  225 g  0   No current facility-administered medications on file prior to visit.    BP 134/88  Pulse 76  Temp(Src) 98.1 F (36.7 C) (Oral)  Wt 207 lb (93.895 kg)  BMI 28.07 kg/m2       Objective:   Physical Exam  Constitutional: He is oriented to person, place, and time. He appears well-developed and well-nourished.  HENT:  Bilateral cerumen impaction  Cardiovascular: Normal rate, regular rhythm and normal heart sounds.   Pulmonary/Chest: Effort normal and breath sounds normal. He has no wheezes.  Neurological: He is alert and oriented to person, place, and time. No cranial nerve deficit.  Psychiatric: He has a normal mood and affect. His behavior is normal.  Assessment & Plan:

## 2012-03-31 NOTE — Patient Instructions (Addendum)
Please complete the following lab tests before your next follow up appointment: BMET, FLP, LFTs, TSH - 401.9

## 2012-04-07 ENCOUNTER — Encounter: Payer: Self-pay | Admitting: Internal Medicine

## 2012-04-07 ENCOUNTER — Ambulatory Visit (INDEPENDENT_AMBULATORY_CARE_PROVIDER_SITE_OTHER): Payer: BC Managed Care – PPO | Admitting: Internal Medicine

## 2012-04-07 VITALS — BP 122/82 | Wt 207.0 lb

## 2012-04-07 NOTE — Patient Instructions (Addendum)
Keep areas dry for 24 hrs. Apply small amount of triple antibiotic ointment and band aid for 3-5 days Contact our office if you develop any redness or pain

## 2012-04-07 NOTE — Progress Notes (Signed)
Subjective:    Patient ID: [name], male    DOB: Feb 04, 1966, 47 y.o.   MRN: [id number]  HPI  47 year old white male with history of testicular cancer and hypertension complains of chronic skin lesions. He complains of irritating skin tag near his right axilla and also larger skin lesion near perineal area.  Skin lesions not hyperpigmented.  No pain or pruritus    Review of Systems See HPI  Past Medical History  Diagnosis Date  . Hyperlipidemia   . Hypertension   . GERD (gastroesophageal reflux disease)   . Atypical chest pain     negative cardiac work up 2010  . Testicle cancer     followed by Dr Isabel Caprice  . History of migraines   . Blood pressure elevated without history of HTN   . Food allergy     anaphylaxis reaction with shell fish    History   Social History  . Marital Status: Divorced    Spouse Name: N/A    Number of Children: N/A  . Years of Education: N/A   Occupational History  . Not on file.   Social History Main Topics  . Smoking status: Former Games developer  . Smokeless tobacco: Not on file  . Alcohol Use: Yes     Comment: occasionally  . Drug Use: No  . Sexually Active: Not on file   Other Topics Concern  . 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 Orchiectomy      and radiation for testicular cancer    Family History  Problem Relation Age of Onset  . Coronary artery disease    . Other Father     CABG at agae 24  . Hypertension    . Hyperlipidemia      Allergies  Allergen Reactions  . Aspirin Other (See Comments)    unknown  . Iodine     REACTION: Reaction not known  . Penicillins Other (See Comments)    unknown  . Shellfish Allergy   . Ace Inhibitors Cough    Current Outpatient Prescriptions on File Prior to Visit  Medication Sig Dispense Refill  .  EPINEPHrine (EPI-PEN) 0.3 mg/0.3 mL DEVI Inject 0.3 mLs (0.3 mg total) into the muscle once.  1 Device  0  . losartan (COZAAR) 50 MG tablet Take 1 tablet (50 mg total) by mouth daily.  90 tablet  1  . omeprazole (PRILOSEC) 20 MG capsule Take 20 mg by mouth daily.      . Testosterone (ANDROGEL) 40.5 MG/2.5GM (1.62%) GEL Place 5 Units onto the skin daily.  225 g  0  . amLODipine (NORVASC) 2.5 MG tablet Take 1 tablet (2.5 mg total) by mouth daily.  90 tablet  1   No current facility-administered medications on file prior to visit.    BP 122/82  Wt 207 lb (93.895 kg)  BMI 28.07 kg/m2       Objective:   Physical Exam  Constitutional: He appears well-developed and well-nourished.  Cardiovascular: Normal rate, regular rhythm and normal heart sounds.   Pulmonary/Chest: Effort normal and breath sounds normal.  Skin:  3 mm skin tag right axilla,  1 cm pedunculated verrucous lesion perineal area  Assessment & Plan:

## 2012-04-07 NOTE — Assessment & Plan Note (Signed)
Patient complains of irritating skin lesions on right exhaust and peroneal area. Area prepped with Betadine. 1% lidocaine with epi (0.5 cc) uses anesthetic. Utilized dermablade to perform shave biopsy of both lesions. Aseptic technique maintained.  Perineal lesion sent to pathology for analysis. Minimal bleeding cauterized.  After care discussed. Patient advised to contact office if he notices any signs of infection.

## 2012-05-04 ENCOUNTER — Encounter: Payer: Self-pay | Admitting: Internal Medicine

## 2012-05-04 ENCOUNTER — Ambulatory Visit (INDEPENDENT_AMBULATORY_CARE_PROVIDER_SITE_OTHER): Payer: BC Managed Care – PPO | Admitting: Internal Medicine

## 2012-05-04 VITALS — BP 130/70 | HR 88 | Temp 98.4°F | Wt 200.0 lb

## 2012-05-04 DIAGNOSIS — J069 Acute upper respiratory infection, unspecified: Secondary | ICD-10-CM

## 2012-05-04 NOTE — Patient Instructions (Addendum)
Gargle with warm salt water and use nasal saline spray. Please contact our office if your symptoms do not improve or gets worse.

## 2012-05-04 NOTE — Progress Notes (Signed)
Subjective:    Patient ID: Cesar Woods, male    DOB: 05-01-1965, 47 y.o.   MRN: [id number]  HPI  47 year old white male with history of hypertension and testicular cancer complains of upper respiratory congestion and cough for last 24-48 hours. His girlfriend recently diagnosed with bronchitis and conjunctivitis. He has experienced mild chills but denies any fever. He denies any wheezing or chest tightness. His cough is nonproductive.   Review of Systems See history of present illness  Past Medical History  Diagnosis Date  . Hyperlipidemia   . Hypertension   . GERD (gastroesophageal reflux disease)   . Atypical chest pain     negative cardiac work up 2010  . Testicle cancer     followed by Dr Isabel Caprice  . History of migraines   . Blood pressure elevated without history of HTN   . Food allergy     anaphylaxis reaction with shell fish    History   Social History  . Marital Status: Divorced    Spouse Name: N/A    Number of Children: N/A  . Years of Education: N/A   Occupational History  . Not on file.   Social History Main Topics  . Smoking status: Former Games developer  . Smokeless tobacco: Not on file  . Alcohol Use: Yes     Comment: occasionally  . Drug Use: No  . Sexually Active: Not on file   Other Topics Concern  . 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 Orchiectomy      and radiation for testicular cancer    Family History  Problem Relation Age of Onset  . Coronary artery disease    . Other Father     CABG at agae 55  . Hypertension    . Hyperlipidemia      Allergies  Allergen Reactions  . Aspirin Other (See Comments)    unknown  . Iodine     REACTION: Reaction not known  . Penicillins Other (See Comments)    unknown  . Shellfish Allergy   . Ace Inhibitors Cough    Current  Outpatient Prescriptions on File Prior to Visit  Medication Sig Dispense Refill  . amLODipine (NORVASC) 2.5 MG tablet Take 1 tablet (2.5 mg total) by mouth daily.  90 tablet  1  . EPINEPHrine (EPI-PEN) 0.3 mg/0.3 mL DEVI Inject 0.3 mLs (0.3 mg total) into the muscle once.  1 Device  0  . losartan (COZAAR) 50 MG tablet Take 1 tablet (50 mg total) by mouth daily.  90 tablet  1  . omeprazole (PRILOSEC) 20 MG capsule Take 20 mg by mouth daily.      . Testosterone (ANDROGEL) 40.5 MG/2.5GM (1.62%) GEL Place 5 Units onto the skin daily.  225 g  0   No current facility-administered medications on file prior to visit.    BP 130/70  Pulse 88  Temp(Src) 98.4 F (36.9 C) (Oral)  Wt 200 lb (90.719 kg)  BMI 27.12 kg/m2       Objective:   Physical Exam  Constitutional: He appears well-developed and well-nourished.  HENT:  Head: Normocephalic and atraumatic.  Right Ear: External ear normal.  Left tympanic membrane slightly erythematous and retracted  Neck: Neck  supple.  No neck tenderness  Cardiovascular: Normal rate, regular rhythm and normal heart sounds.   Pulmonary/Chest: Effort normal and breath sounds normal. He has no wheezes.  Lymphadenopathy:    He has no cervical adenopathy.  Psychiatric: He has a normal mood and affect. His behavior is normal.          Assessment & Plan:

## 2012-05-04 NOTE — Assessment & Plan Note (Signed)
47 year old white male with signs and symptoms of viral upper respiratory infection. I recommended symptomatic treatment.  Patient advised to call office if symptoms persist or worsen.  If patient develops fever or left ear pain, consider ceftin 500 mg bid for 10 days.

## 2012-05-15 ENCOUNTER — Other Ambulatory Visit: Payer: Self-pay | Admitting: Internal Medicine

## 2012-05-15 DIAGNOSIS — E049 Nontoxic goiter, unspecified: Secondary | ICD-10-CM

## 2012-05-24 ENCOUNTER — Inpatient Hospital Stay: Admission: RE | Admit: 2012-05-24 | Payer: BC Managed Care – PPO | Source: Ambulatory Visit

## 2012-05-30 ENCOUNTER — Other Ambulatory Visit: Payer: Self-pay | Admitting: Internal Medicine

## 2012-09-14 ENCOUNTER — Other Ambulatory Visit (INDEPENDENT_AMBULATORY_CARE_PROVIDER_SITE_OTHER): Payer: BC Managed Care – PPO

## 2012-09-14 DIAGNOSIS — I10 Essential (primary) hypertension: Secondary | ICD-10-CM

## 2012-09-14 LAB — HEPATIC FUNCTION PANEL
AST: 17 U/L (ref 0–37)
Albumin: 4.2 g/dL (ref 3.5–5.2)
Alkaline Phosphatase: 67 U/L (ref 39–117)
Bilirubin, Direct: 0.1 mg/dL (ref 0.0–0.3)
Total Protein: 7.2 g/dL (ref 6.0–8.3)

## 2012-09-14 LAB — BASIC METABOLIC PANEL
CO2: 29 mEq/L (ref 19–32)
Calcium: 9.1 mg/dL (ref 8.4–10.5)
Chloride: 105 mEq/L (ref 96–112)
Glucose, Bld: 101 mg/dL — ABNORMAL HIGH (ref 70–99)
Potassium: 3.9 mEq/L (ref 3.5–5.1)
Sodium: 140 mEq/L (ref 135–145)

## 2012-09-18 LAB — LDL CHOLESTEROL, DIRECT: Direct LDL: 99.9 mg/dL

## 2012-09-18 LAB — LIPID PANEL
Total CHOL/HDL Ratio: 6
Triglycerides: 416 mg/dL — ABNORMAL HIGH (ref 0.0–149.0)

## 2012-09-21 ENCOUNTER — Other Ambulatory Visit: Payer: BC Managed Care – PPO

## 2012-09-28 ENCOUNTER — Ambulatory Visit (INDEPENDENT_AMBULATORY_CARE_PROVIDER_SITE_OTHER): Payer: BC Managed Care – PPO | Admitting: Internal Medicine

## 2012-09-28 ENCOUNTER — Encounter: Payer: Self-pay | Admitting: Internal Medicine

## 2012-09-28 VITALS — BP 126/80 | HR 80 | Temp 98.3°F | Resp 16 | Ht 72.0 in | Wt 202.0 lb

## 2012-09-28 DIAGNOSIS — C6290 Malignant neoplasm of unspecified testis, unspecified whether descended or undescended: Secondary | ICD-10-CM

## 2012-09-28 DIAGNOSIS — R0602 Shortness of breath: Secondary | ICD-10-CM

## 2012-09-28 DIAGNOSIS — E785 Hyperlipidemia, unspecified: Secondary | ICD-10-CM

## 2012-09-28 DIAGNOSIS — I10 Essential (primary) hypertension: Secondary | ICD-10-CM

## 2012-09-28 NOTE — Assessment & Plan Note (Signed)
Well controlled.  BP: 126/80 mmHg  Lab Results  Component Value Date   NA 140 09/14/2012   K 3.9 09/14/2012   CL 105 09/14/2012   CO2 29 09/14/2012   Lab Results  Component Value Date   CREATININE 0.9 09/14/2012

## 2012-09-28 NOTE — Assessment & Plan Note (Signed)
I urged dietary changes, regular exercise and trial of OTC fish oil.  Lab Results  Component Value Date   CHOL 195 09/15/2012   HDL 30.30* 09/15/2012   LDLCALC See Comment mg/dL 05/17/8117   LDLDIRECT 14.7 09/15/2012   TRIG 416.0* 09/15/2012   CHOLHDL 6 09/15/2012   Lab Results  Component Value Date   ALT 26 09/14/2012   AST 17 09/14/2012   ALKPHOS 67 09/14/2012   BILITOT 1.3* 09/14/2012   Lab Results  Component Value Date   TSH 0.45 09/15/2012

## 2012-09-28 NOTE — Assessment & Plan Note (Signed)
Patient had normal chest x-ray on 06/26/2010. Pulmonary function tests also completed on 10/21/2009. It showed mild restriction of total lung capacity. Expiratory flows reduce to proportion to the restrictive lung volume, without response to bronchodilator. Doubt significant obstruction. Normal diffusion capacity.  (Interpreted by Dr. Maple Hudson).  Repeat CXR.  He is concerned about metastatic testicular cancer.  Anxiety may be playing a role.

## 2012-09-28 NOTE — Progress Notes (Signed)
Subjective:    Patient ID: Cesar Woods, male    DOB: 1965/09/11, 47 y.o.   MRN: [id number]  HPI  47 year old white male with history of hypertension, testicular cancer and gastroesophageal reflux disease for routine followup. Patient denies significant interval medical history. He's had increased stress at work. He is doing multiple jobs. His fiance also moved in with her 38 year old daughter.  Patient very compliant with his blood pressure medication.  He still complains of unexplained shortness of breath. He has intermittent cough. His previous chest x-ray has been negative. Previous PFTs showed mild restriction.  Reviewed lipid panel - triglycerides elevated.  He denies etoh use.  Review of Systems Intermittent cough, negative for chest pain    Past Medical History  Diagnosis Date  . Hyperlipidemia   . Hypertension   . GERD (gastroesophageal reflux disease)   . Atypical chest pain     negative cardiac work up 2010  . Testicle cancer     followed by Dr Isabel Caprice  . History of migraines   . Blood pressure elevated without history of HTN   . Food allergy     anaphylaxis reaction with shell fish    History   Social History  . Marital Status: Divorced    Spouse Name: N/A    Number of Children: N/A  . Years of Education: N/A   Occupational History  . Not on file.   Social History Main Topics  . Smoking status: Former Games developer  . Smokeless tobacco: Not on file  . Alcohol Use: Yes     Comment: occasionally  . Drug Use: No  . Sexual Activity: Not on file   Other Topics Concern  . 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 Orchiectomy      and radiation for testicular cancer    Family History  Problem Relation Age of Onset  . Coronary artery disease    . Other Father     CABG at agae 59  .  Hypertension    . Hyperlipidemia      Allergies  Allergen Reactions  . Aspirin Other (See Comments)    unknown  . Iodine     REACTION: Reaction not known  . Penicillins Other (See Comments)    unknown  . Shellfish Allergy   . Ace Inhibitors Cough    Current Outpatient Prescriptions on File Prior to Visit  Medication Sig Dispense Refill  . amLODipine (NORVASC) 2.5 MG tablet Take 1 tablet (2.5 mg total) by mouth daily.  90 tablet  1  . EPIPEN 2-PAK 0.3 MG/0.3ML DEVI INJECT 0.3 MLS INTO THE MUSCLE ONCE **PATIENT NEEDS TO SCHEDULE AN OFFICE VISIT**  2 Device  0  . losartan (COZAAR) 50 MG tablet Take 1 tablet (50 mg total) by mouth daily.  90 tablet  1  . omeprazole (PRILOSEC) 20 MG capsule Take 20 mg by mouth daily.      . Testosterone (ANDROGEL) 40.5 MG/2.5GM (1.62%) GEL Place 5 Units onto the skin daily.  225 g  0   No current facility-administered medications on file prior to visit.    BP 126/80  Pulse 80  Temp(Src) 98.3 F (36.8 C)  Resp 16  Ht 6' (1.829 m)  Wt 202 lb (91.627 kg)  BMI 27.39 kg/m2    Objective:   Physical Exam  Constitutional: He is oriented to person, place, and time. He appears well-developed and well-nourished. No distress.  Neck: Neck supple.  Cardiovascular: Normal rate, regular rhythm and normal heart sounds.   No murmur heard. Pulmonary/Chest: Effort normal and breath sounds normal. He has no wheezes.  Musculoskeletal: He exhibits no edema.  No calf tenderness  Lymphadenopathy:    He has no cervical adenopathy.  Neurological: He is alert and oriented to person, place, and time. No cranial nerve deficit.  Skin: Skin is warm and dry. No pallor.  Psychiatric: He has a normal mood and affect. His behavior is normal.          Assessment & Plan:

## 2012-10-02 ENCOUNTER — Other Ambulatory Visit: Payer: Self-pay | Admitting: Internal Medicine

## 2012-11-01 ENCOUNTER — Other Ambulatory Visit: Payer: Self-pay | Admitting: Internal Medicine

## 2013-01-19 ENCOUNTER — Telehealth: Payer: Self-pay | Admitting: Internal Medicine

## 2013-01-19 NOTE — Telephone Encounter (Signed)
Pt went to Urgent Care.  They stated it was inflammation in the chest area, not cardiac related.  Gave pt meloxicam and a shot of toradol.  EKG was normal.  Instructed pt if he doesn't feel better than to follow up with [place].  Pt verbalized understanding and has no questions

## 2013-01-19 NOTE — Telephone Encounter (Signed)
Patient Information:  Caller Name: [name]  Phone: [phone]  Patient: Cesar Woods, Cesar Woods  Gender: Male  DOB: [birth year]  Age: 47 Years  PCP: Artist Pais Doe-Hyun Molly Maduro) (Adults only)  Office Follow Up:  Does the office need to follow up with this patient?: No  Instructions For The Office: N/A   Symptoms  Reason For Call & Symptoms: Pt is calling for an appt. He has a hx of acid reflux which has acted up. It was under control with taking Omeprazole daily.   He was travelling the week before Thanksgiving for 3 weeks total and he began having severe sx. He was having dyspepsia/belching and medication wasn't helping. He has a constant feeling of acid in his throat. He saw a MD in [place] because he was going out of the country for a week. Pt was taken off Omeprazole and is taking Nexium. He felt the initial flare up resolved but now its worse again. He cant drink coffee or anything without sx.  He has developed chest pain under the sternum. It worsens when he leans forwards. Chest pain started last week on Monday 01/09/23 and lasted 24h. Then over the past 2-3 days it has returned. RN triaged and pt did have a blood clot in his legs when he was 19 which gives the disposition of ED. RN followed protocol and called the office to speak with Tim Lair who advised ED. Pt instructed to go to [facility]  Reviewed Health History In EMR: Yes  Reviewed Medications In EMR: Yes  Reviewed Allergies In EMR: Yes  Reviewed Surgeries / Procedures: Yes  Date of Onset of Symptoms: 01/16/2013  Guideline(s) Used:  Chest Pain  Disposition Per Guideline:   Go to ED Now  Reason For Disposition Reached:   History of prior "blood clot" in leg or lungs (i.e., deep vein thrombosis, pulmonary embolism)  Advice Given:  N/A  Patient Will Follow Care Advice:  YES

## 2013-01-25 ENCOUNTER — Telehealth: Payer: Self-pay | Admitting: Internal Medicine

## 2013-01-25 NOTE — Telephone Encounter (Signed)
Pt would like to know if you can tell him where he had his colonoscopy done

## 2013-02-26 ENCOUNTER — Other Ambulatory Visit: Payer: Self-pay | Admitting: Gastroenterology

## 2013-03-30 ENCOUNTER — Ambulatory Visit: Payer: BC Managed Care – PPO | Admitting: Internal Medicine

## 2013-03-30 DIAGNOSIS — Z0289 Encounter for other administrative examinations: Secondary | ICD-10-CM

## 2013-06-12 ENCOUNTER — Other Ambulatory Visit: Payer: Self-pay | Admitting: Internal Medicine

## 2013-07-10 ENCOUNTER — Other Ambulatory Visit: Payer: Self-pay | Admitting: Internal Medicine

## 2013-07-10 DIAGNOSIS — E042 Nontoxic multinodular goiter: Secondary | ICD-10-CM

## 2013-07-31 ENCOUNTER — Other Ambulatory Visit: Payer: BC Managed Care – PPO

## 2013-12-07 DIAGNOSIS — R1011 Right upper quadrant pain: Secondary | ICD-10-CM | POA: Insufficient documentation

## 2014-01-16 ENCOUNTER — Other Ambulatory Visit: Payer: Self-pay | Admitting: Internal Medicine

## 2014-01-16 DIAGNOSIS — E042 Nontoxic multinodular goiter: Secondary | ICD-10-CM

## 2014-01-17 ENCOUNTER — Ambulatory Visit: Payer: BC Managed Care – PPO

## 2014-01-17 DIAGNOSIS — E042 Nontoxic multinodular goiter: Secondary | ICD-10-CM

## 2014-01-22 ENCOUNTER — Ambulatory Visit
Admission: RE | Admit: 2014-01-22 | Discharge: 2014-01-22 | Disposition: A | Discharge status: Home / Self Care | Payer: BC Managed Care – PPO | Source: Ambulatory Visit | Attending: Internal Medicine | Admitting: Internal Medicine

## 2014-01-22 ENCOUNTER — Other Ambulatory Visit: Payer: Self-pay | Admitting: Internal Medicine

## 2014-01-22 DIAGNOSIS — R059 Cough, unspecified: Secondary | ICD-10-CM

## 2014-01-22 DIAGNOSIS — R0689 Other abnormalities of breathing: Secondary | ICD-10-CM

## 2014-01-22 DIAGNOSIS — R05 Cough: Secondary | ICD-10-CM

## 2014-01-23 ENCOUNTER — Other Ambulatory Visit: Payer: Self-pay | Admitting: Internal Medicine

## 2014-01-23 DIAGNOSIS — E042 Nontoxic multinodular goiter: Secondary | ICD-10-CM

## 2014-02-08 DIAGNOSIS — I219 Acute myocardial infarction, unspecified: Secondary | ICD-10-CM

## 2014-02-08 HISTORY — DX: Acute myocardial infarction, unspecified: I21.9

## 2014-02-12 ENCOUNTER — Other Ambulatory Visit: Payer: Self-pay | Admitting: Internal Medicine

## 2014-02-12 DIAGNOSIS — E042 Nontoxic multinodular goiter: Secondary | ICD-10-CM

## 2014-02-13 ENCOUNTER — Ambulatory Visit
Admission: RE | Admit: 2014-02-13 | Discharge: 2014-02-13 | Disposition: A | Discharge status: Home / Self Care | Payer: Self-pay | Source: Ambulatory Visit | Attending: Internal Medicine | Admitting: Internal Medicine

## 2014-02-13 ENCOUNTER — Other Ambulatory Visit (HOSPITAL_COMMUNITY)
Admission: RE | Admit: 2014-02-13 | Discharge: 2014-02-13 | Disposition: A | Discharge status: Home / Self Care | Payer: BLUE CROSS/BLUE SHIELD | Source: Ambulatory Visit | Attending: Interventional Radiology | Admitting: Interventional Radiology

## 2014-02-13 DIAGNOSIS — E042 Nontoxic multinodular goiter: Secondary | ICD-10-CM

## 2014-02-13 DIAGNOSIS — E041 Nontoxic single thyroid nodule: Secondary | ICD-10-CM | POA: Insufficient documentation

## 2015-01-14 ENCOUNTER — Inpatient Hospital Stay (HOSPITAL_COMMUNITY)
Admission: EM | Admit: 2015-01-14 | Discharge: 2015-01-18 | DRG: 246 | Disposition: A | Discharge status: Home / Self Care | Payer: BLUE CROSS/BLUE SHIELD | Attending: Cardiology | Admitting: Cardiology

## 2015-01-14 ENCOUNTER — Encounter (HOSPITAL_COMMUNITY): Payer: Self-pay | Admitting: *Deleted

## 2015-01-14 ENCOUNTER — Emergency Department (HOSPITAL_COMMUNITY): Payer: BLUE CROSS/BLUE SHIELD

## 2015-01-14 DIAGNOSIS — R42 Dizziness and giddiness: Secondary | ICD-10-CM | POA: Diagnosis not present

## 2015-01-14 DIAGNOSIS — Z91013 Allergy to seafood: Secondary | ICD-10-CM | POA: Diagnosis not present

## 2015-01-14 DIAGNOSIS — I2510 Atherosclerotic heart disease of native coronary artery without angina pectoris: Secondary | ICD-10-CM | POA: Diagnosis present

## 2015-01-14 DIAGNOSIS — Z886 Allergy status to analgesic agent status: Secondary | ICD-10-CM | POA: Diagnosis not present

## 2015-01-14 DIAGNOSIS — Z880 Allergy status to penicillin: Secondary | ICD-10-CM | POA: Diagnosis not present

## 2015-01-14 DIAGNOSIS — Z8249 Family history of ischemic heart disease and other diseases of the circulatory system: Secondary | ICD-10-CM | POA: Diagnosis not present

## 2015-01-14 DIAGNOSIS — I119 Hypertensive heart disease without heart failure: Secondary | ICD-10-CM | POA: Diagnosis present

## 2015-01-14 DIAGNOSIS — I5021 Acute systolic (congestive) heart failure: Secondary | ICD-10-CM | POA: Diagnosis present

## 2015-01-14 DIAGNOSIS — E785 Hyperlipidemia, unspecified: Secondary | ICD-10-CM | POA: Diagnosis present

## 2015-01-14 DIAGNOSIS — Z9079 Acquired absence of other genital organ(s): Secondary | ICD-10-CM | POA: Diagnosis not present

## 2015-01-14 DIAGNOSIS — Z923 Personal history of irradiation: Secondary | ICD-10-CM

## 2015-01-14 DIAGNOSIS — I213 ST elevation (STEMI) myocardial infarction of unspecified site: Secondary | ICD-10-CM | POA: Diagnosis not present

## 2015-01-14 DIAGNOSIS — I2109 ST elevation (STEMI) myocardial infarction involving other coronary artery of anterior wall: Principal | ICD-10-CM | POA: Diagnosis present

## 2015-01-14 DIAGNOSIS — Z8547 Personal history of malignant neoplasm of testis: Secondary | ICD-10-CM | POA: Diagnosis not present

## 2015-01-14 DIAGNOSIS — I110 Hypertensive heart disease with heart failure: Secondary | ICD-10-CM | POA: Diagnosis present

## 2015-01-14 DIAGNOSIS — K219 Gastro-esophageal reflux disease without esophagitis: Secondary | ICD-10-CM | POA: Diagnosis present

## 2015-01-14 DIAGNOSIS — I252 Old myocardial infarction: Secondary | ICD-10-CM | POA: Diagnosis present

## 2015-01-14 DIAGNOSIS — I951 Orthostatic hypotension: Secondary | ICD-10-CM | POA: Diagnosis not present

## 2015-01-14 DIAGNOSIS — I2542 Coronary artery dissection: Secondary | ICD-10-CM | POA: Diagnosis present

## 2015-01-14 DIAGNOSIS — Z79890 Hormone replacement therapy (postmenopausal): Secondary | ICD-10-CM | POA: Diagnosis not present

## 2015-01-14 DIAGNOSIS — I10 Essential (primary) hypertension: Secondary | ICD-10-CM | POA: Diagnosis not present

## 2015-01-14 DIAGNOSIS — R17 Unspecified jaundice: Secondary | ICD-10-CM | POA: Diagnosis present

## 2015-01-14 DIAGNOSIS — R072 Precordial pain: Secondary | ICD-10-CM | POA: Diagnosis present

## 2015-01-14 DIAGNOSIS — R740 Nonspecific elevation of levels of transaminase and lactic acid dehydrogenase [LDH]: Secondary | ICD-10-CM | POA: Diagnosis present

## 2015-01-14 DIAGNOSIS — Z888 Allergy status to other drugs, medicaments and biological substances status: Secondary | ICD-10-CM

## 2015-01-14 DIAGNOSIS — Z87891 Personal history of nicotine dependence: Secondary | ICD-10-CM

## 2015-01-14 DIAGNOSIS — I249 Acute ischemic heart disease, unspecified: Secondary | ICD-10-CM | POA: Diagnosis present

## 2015-01-14 DIAGNOSIS — Z91048 Other nonmedicinal substance allergy status: Secondary | ICD-10-CM | POA: Diagnosis not present

## 2015-01-14 DIAGNOSIS — R7401 Elevation of levels of liver transaminase levels: Secondary | ICD-10-CM

## 2015-01-14 DIAGNOSIS — I255 Ischemic cardiomyopathy: Secondary | ICD-10-CM | POA: Diagnosis present

## 2015-01-14 DIAGNOSIS — I5042 Chronic combined systolic (congestive) and diastolic (congestive) heart failure: Secondary | ICD-10-CM | POA: Diagnosis not present

## 2015-01-14 HISTORY — DX: Essential (primary) hypertension: I10

## 2015-01-14 LAB — COMPREHENSIVE METABOLIC PANEL
ALBUMIN: 4.1 g/dL (ref 3.5–5.0)
ALT: 34 U/L (ref 17–63)
AST: 82 U/L — AB (ref 15–41)
Alkaline Phosphatase: 74 U/L (ref 38–126)
Anion gap: 11 (ref 5–15)
BUN: 15 mg/dL (ref 6–20)
CHLORIDE: 102 mmol/L (ref 101–111)
CO2: 25 mmol/L (ref 22–32)
Calcium: 9.1 mg/dL (ref 8.9–10.3)
Creatinine, Ser: 0.93 mg/dL (ref 0.61–1.24)
GFR calc Af Amer: >60 mL/min (ref >60)
GFR calc non Af Amer: >60 mL/min (ref >60)
GLUCOSE: 97 mg/dL (ref 65–99)
POTASSIUM: 4.1 mmol/L (ref 3.5–5.1)
Sodium: 138 mmol/L (ref 135–145)
Total Bilirubin: 1.6 mg/dL — ABNORMAL HIGH (ref 0.3–1.2)
Total Protein: 7 g/dL (ref 6.5–8.1)

## 2015-01-14 LAB — CBC WITH DIFFERENTIAL/PLATELET
BASOS PCT: 0 %
Basophils Absolute: 0 10*3/uL (ref 0.0–0.1)
EOS ABS: 0.1 10*3/uL (ref 0.0–0.7)
EOS PCT: 1 %
HEMATOCRIT: 45 % (ref 39.0–52.0)
HEMOGLOBIN: 15.4 g/dL (ref 13.0–17.0)
LYMPHS PCT: 22 %
Lymphs Abs: 2.6 10*3/uL (ref 0.7–4.0)
MCH: 29.8 pg (ref 26.0–34.0)
MCHC: 34.2 g/dL (ref 30.0–36.0)
MCV: 87 fL (ref 78.0–100.0)
Monocytes Absolute: 1.2 10*3/uL — ABNORMAL HIGH (ref 0.1–1.0)
Monocytes Relative: 10 %
NEUTROS ABS: 7.8 10*3/uL — AB (ref 1.7–7.7)
NEUTROS PCT: 67 %
PLATELETS: 176 10*3/uL (ref 150–400)
RBC: 5.17 MIL/uL (ref 4.22–5.81)
RDW: 12.9 % (ref 11.5–15.5)
WBC: 11.7 10*3/uL — ABNORMAL HIGH (ref 4.0–10.5)

## 2015-01-14 LAB — TROPONIN I
TROPONIN I: 11.21 ng/mL — AB (ref <0.031)
Troponin I: 13.9 ng/mL (ref <0.031)

## 2015-01-14 MED ORDER — DIPHENHYDRAMINE HCL 25 MG PO CAPS
50.0000 mg | ORAL_CAPSULE | Freq: Once | ORAL | Status: AC
  Administered 2015-01-15: 50 mg via ORAL
  Filled 2015-01-14: qty 2

## 2015-01-14 MED ORDER — ACETAMINOPHEN 325 MG PO TABS
650.0000 mg | ORAL_TABLET | ORAL | Status: DC | PRN
  Administered 2015-01-15: 650 mg via ORAL
  Filled 2015-01-14: qty 2

## 2015-01-14 MED ORDER — NITROGLYCERIN 0.4 MG SL SUBL: 0.4000 mg | SUBLINGUAL_TABLET | SUBLINGUAL | Status: DC | PRN

## 2015-01-14 MED ORDER — ONDANSETRON HCL 4 MG/2ML IJ SOLN: 4.0000 mg | Freq: Four times a day (QID) | INTRAMUSCULAR | Status: DC | PRN

## 2015-01-14 MED ORDER — ASPIRIN EC 81 MG PO TBEC: 81.0000 mg | DELAYED_RELEASE_TABLET | Freq: Every day | ORAL | Status: DC

## 2015-01-14 MED ORDER — DIPHENHYDRAMINE HCL 25 MG PO CAPS
50.0000 mg | ORAL_CAPSULE | Freq: Once | ORAL | Status: DC
  Administered 2015-01-14: 50 mg via ORAL
  Filled 2015-01-14: qty 2

## 2015-01-14 MED ORDER — SODIUM CHLORIDE 0.9 % IJ SOLN
3.0000 mL | Freq: Two times a day (BID) | INTRAMUSCULAR | Status: DC
Start: 2015-01-14 — End: 2015-01-15

## 2015-01-14 MED ORDER — HEPARIN BOLUS VIA INFUSION
4000.0000 [IU] | Freq: Once | INTRAVENOUS | Status: AC
  Administered 2015-01-14: 4000 [IU] via INTRAVENOUS
  Filled 2015-01-14: qty 4000

## 2015-01-14 MED ORDER — SODIUM CHLORIDE 0.9 % WEIGHT BASED INFUSION: 1.0000 mL/kg/h | INTRAVENOUS | Status: DC

## 2015-01-14 MED ORDER — FAMOTIDINE 20 MG PO TABS
20.0000 mg | ORAL_TABLET | Freq: Once | ORAL | Status: AC
  Administered 2015-01-15: 20 mg via ORAL
  Filled 2015-01-14: qty 1

## 2015-01-14 MED ORDER — ATORVASTATIN CALCIUM 80 MG PO TABS: 80.0000 mg | ORAL_TABLET | Freq: Every day | ORAL | Status: DC

## 2015-01-14 MED ORDER — HEPARIN (PORCINE) IN NACL 100-0.45 UNIT/ML-% IJ SOLN
1500.0000 [IU]/h | INTRAMUSCULAR | Status: DC
  Administered 2015-01-14: 1200 [IU]/h via INTRAVENOUS
  Administered 2015-01-15: 1500 [IU]/h via INTRAVENOUS
  Filled 2015-01-14 (×2): qty 250

## 2015-01-14 MED ORDER — SODIUM CHLORIDE 0.9 % IV SOLN: 250.0000 mL | INTRAVENOUS | Status: DC | PRN

## 2015-01-14 MED ORDER — METOPROLOL TARTRATE 12.5 MG HALF TABLET
12.5000 mg | ORAL_TABLET | Freq: Two times a day (BID) | ORAL | Status: DC
  Administered 2015-01-14 – 2015-01-16 (×4): 12.5 mg via ORAL
  Filled 2015-01-14 (×4): qty 1

## 2015-01-14 MED ORDER — ASPIRIN 81 MG PO CHEW
81.0000 mg | CHEWABLE_TABLET | ORAL | Status: AC
  Administered 2015-01-15: 81 mg via ORAL
  Filled 2015-01-14: qty 1

## 2015-01-14 MED ORDER — SODIUM CHLORIDE 0.9 % WEIGHT BASED INFUSION
3.0000 mL/kg/h | INTRAVENOUS | Status: DC
  Administered 2015-01-15: 3 mL/kg/h via INTRAVENOUS

## 2015-01-14 MED ORDER — SODIUM CHLORIDE 0.9 % IJ SOLN: 3.0000 mL | INTRAMUSCULAR | Status: DC | PRN

## 2015-01-14 MED ORDER — LOSARTAN POTASSIUM 50 MG PO TABS
50.0000 mg | ORAL_TABLET | Freq: Every day | ORAL | Status: DC
  Administered 2015-01-15 – 2015-01-16 (×3): 50 mg via ORAL
  Filled 2015-01-14 (×3): qty 1

## 2015-01-14 MED ORDER — PREDNISONE 50 MG PO TABS
50.0000 mg | ORAL_TABLET | Freq: Four times a day (QID) | ORAL | Status: AC
  Administered 2015-01-14 – 2015-01-15 (×3): 50 mg via ORAL
  Filled 2015-01-14: qty 1
  Filled 2015-01-14: qty 3
  Filled 2015-01-14: qty 1

## 2015-01-14 MED ORDER — FAMOTIDINE 20 MG PO TABS: 20.0000 mg | ORAL_TABLET | Freq: Once | ORAL | Status: DC

## 2015-01-14 MED ORDER — INFLUENZA VAC SPLIT QUAD 0.5 ML IM SUSY
0.5000 mL | PREFILLED_SYRINGE | INTRAMUSCULAR | Status: AC
  Administered 2015-01-18: 0.5 mL via INTRAMUSCULAR
  Filled 2015-01-14: qty 0.5

## 2015-01-14 MED ORDER — TESTOSTERONE 50 MG/5GM (1%) TD GEL
5.0000 g | Freq: Every day | TRANSDERMAL | Status: DC
  Administered 2015-01-16: 5 g via TRANSDERMAL
  Filled 2015-01-14: qty 5

## 2015-01-14 NOTE — ED Provider Notes (Signed)
CSN: [id number]     Arrival date & time 01/14/15  1746 History   First MD Initiated Contact with Patient 01/14/15 1753     Chief complaint: Abnormal ECG  (Consider location/radiation/quality/duration/timing/severity/associated sxs/prior Treatment) The history is provided by the patient.   49 year old male had some chest discomfort yesterday morning. He describes a tight, pressure feeling in his lower chest. Has a history of acid reflux and thought that was what it was. The discomfort stayed with him through a midafternoon. Nothing made it better nothing made it worse. He rated it at 5/10. There is no radiation of discomfort. Has some baseline dyspnea which he does not think was significantly different. He denies nausea or diaphoresis. Today, he has not had any chest discomfort but has had some vague palpitations. He saw his PCP who noted an abnormal ECG and sent to the ED. He was given aspirin in the ambulance en route. His cardiac risk factors include hypertension and borderline hyperlipidemia (not on any medication for hyperlipidemia currently). He denies history of diabetes. He is a former smoker having quit 6 years ago. There is a family history of coronary artery disease but not premature coronary atherosclerosis. His father had bypass surgery in his 54s. He does have history of testicular cancer.  Past Medical History  Diagnosis Date  . Hyperlipidemia   . Hypertension   . GERD (gastroesophageal reflux disease)   . Atypical chest pain     negative cardiac work up 2010  . Testicle cancer ([place])     followed by Dr Risa Grill  . History of migraines   . Blood pressure elevated without history of HTN   . Food allergy     anaphylaxis reaction with shell fish   Past Surgical History  Procedure Laterality Date  . Orchiectomy      and radiation for testicular cancer   Family History  Problem Relation Age of Onset  . Coronary artery disease    . Other Father     CABG at agae 62  .  Hypertension    . Hyperlipidemia     Social History  Substance Use Topics  . Smoking status: Former Research scientist (life sciences)  . Smokeless tobacco: None  . Alcohol Use: Yes     Comment: occasionally    Review of Systems  All other systems reviewed and are negative.     Allergies  Aspirin; Iodine; Penicillins; Shellfish allergy; and Ace inhibitors  Home Medications   Prior to Admission medications   Medication Sig Start Date End Date Taking? Authorizing Provider  amLODipine (NORVASC) 2.5 MG tablet TAKE 1 TABLET (2.5 MG TOTAL) BY MOUTH DAILY. 11/01/12   Doe-Hyun R Shawna Orleans, DO  EPIPEN 2-PAK 0.3 MG/0.3ML SOAJ injection INJECT 0.3 MLS INTO THE MUSCLE ONCE **PATIENT NEEDS TO SCHEDULE AN OFFICE VISIT** 06/12/13   Doe-Hyun R Shawna Orleans, DO  losartan (COZAAR) 50 MG tablet TAKE 1 TABLET (50 MG TOTAL) BY MOUTH DAILY. 10/02/12   Doe-Hyun R Shawna Orleans, DO  omeprazole (PRILOSEC) 20 MG capsule Take 20 mg by mouth daily.    Historical Provider, MD  Testosterone (ANDROGEL) 40.5 MG/2.5GM (1.62%) GEL Place 5 Units onto the skin daily. 02/26/11   Carlisle Cater, PA-C   BP 155/98 mmHg  Pulse 98  Temp(Src) 97.9 F (36.6 C) (Oral)  Resp 26  Ht 6' (1.829 m)  Wt 204 lb (92.534 kg)  BMI 27.66 kg/m2  SpO2 97% Physical Exam  Nursing note and vitals reviewed.  49 year old male, resting comfortably and in no  acute distress. Vital signs are significant for hypertension and tachypnea. Oxygen saturation is 97%, which is normal. Head is normocephalic and atraumatic. PERRLA, EOMI. Oropharynx is clear. Neck is nontender and supple without adenopathy or JVD. Back is nontender and there is no CVA tenderness. Lungs are clear without rales, wheezes, or rhonchi. Chest is nontender. Heart has regular rate and rhythm without murmur. Abdomen is soft, flat, nontender without masses or hepatosplenomegaly and peristalsis is normoactive. Extremities have no cyanosis or edema, full range of motion is present. Skin is warm and dry without rash. Neurologic:  Mental status is normal, cranial nerves are intact, there are no motor or sensory deficits.  ED Course  Procedures (including critical care time) Labs Review Results for orders placed or performed during the hospital encounter of 01/14/15  CBC with Differential  Result Value Ref Range   WBC 11.7 (H) 4.0 - 10.5 K/uL   RBC 5.17 4.22 - 5.81 MIL/uL   Hemoglobin 15.4 13.0 - 17.0 g/dL   HCT 45.0 39.0 - 52.0 %   MCV 87.0 78.0 - 100.0 fL   MCH 29.8 26.0 - 34.0 pg   MCHC 34.2 30.0 - 36.0 g/dL   RDW 12.9 11.5 - 15.5 %   Platelets 176 150 - 400 K/uL   Neutrophils Relative % 67 %   Lymphocytes Relative 22 %   Monocytes Relative 10 %   Eosinophils Relative 1 %   Basophils Relative 0 %   Neutro Abs 7.8 (H) 1.7 - 7.7 K/uL   Lymphs Abs 2.6 0.7 - 4.0 K/uL   Monocytes Absolute 1.2 (H) 0.1 - 1.0 K/uL   Eosinophils Absolute 0.1 0.0 - 0.7 K/uL   Basophils Absolute 0.0 0.0 - 0.1 K/uL   Smear Review MORPHOLOGY UNREMARKABLE   Comprehensive metabolic panel  Result Value Ref Range   Sodium 138 135 - 145 mmol/L   Potassium 4.1 3.5 - 5.1 mmol/L   Chloride 102 101 - 111 mmol/L   CO2 25 22 - 32 mmol/L   Glucose, Bld 97 65 - 99 mg/dL   BUN 15 6 - 20 mg/dL   Creatinine, Ser 0.93 0.61 - 1.24 mg/dL   Calcium 9.1 8.9 - 10.3 mg/dL   Total Protein 7.0 6.5 - 8.1 g/dL   Albumin 4.1 3.5 - 5.0 g/dL   AST 82 (H) 15 - 41 U/L   ALT 34 17 - 63 U/L   Alkaline Phosphatase 74 38 - 126 U/L   Total Bilirubin 1.6 (H) 0.3 - 1.2 mg/dL   GFR calc non Af Amer >60 >60 mL/min   GFR calc Af Amer >60 >60 mL/min   Anion gap 11 5 - 15  Troponin I  Result Value Ref Range   Troponin I 13.90 (HH) <0.031 ng/mL    Imaging Review Dg Chest Port 1 View  01/14/2015  CLINICAL DATA:  Patient woke up yesterday with chest pain, cardiac palpitations, and dizziness. EXAM: PORTABLE CHEST 1 VIEW COMPARISON:  01/22/2014 FINDINGS: The heart size and mediastinal contours are within normal limits. Both lungs are clear. The visualized skeletal  structures are unremarkable. IMPRESSION: No active disease. Electronically Signed   By: Van Clines M.D.   On: 01/14/2015 19:03   I have personally reviewed and evaluated these images and lab results as part of my medical decision-making.   EKG Interpretation   Date/Time:  Tuesday January 14 2015 17:48:46 EST Ventricular Rate:  95 PR Interval:  164 QRS Duration: 89 QT Interval:  389 QTC Calculation: 489  R Axis:   -28 Text Interpretation:  Sinus rhythm Probable left atrial enlargement  Borderline left axis deviation Abnrm T, consider ischemia, anterolateral  lds Borderline ST elevation, anterolateral leads When compared with ECG of  07/26/2008, Left axis deviation is now prre T wave inversion is now Present  in the lateral and anterolateral leads Minimal ST elevation is now present  anterolateral leads Confirmed by [facility]  MD, Caroline Matters ([id number]) on 01/14/2015  5:53:20 PM      MDM   Final diagnoses:  Acute coronary syndrome (HCC)  Elevated transaminase level  Elevated bilirubin    Chest discomfort with abnormal ECG. ECG is strongly suggestive of anterolateral MI having occurred yesterday with evolution with residual ST elevation and new T-wave inversion. However, since last ECG was 6 years ago, it is possible that the changes occurred from something in between. Troponin will be checked. If positive, he will need to be admitted. If negative, he will need outpatient cardiology evaluation.  Troponin is come back significantly elevated indicating his discomfort yesterday was a myocardial infarction. He is started on heparin. Incidentally noted is mild elevation of AST and bilirubin. Bilirubin has been elevated in past and may represent Gilbert's Disease. Case is discussed with Dr. Domenic Polite of cardiology service who has looked at the patient's ECG and is coming to the ED to evaluate the patient. He has continued to be completely symptom-free in the ED.   Delora Fuel, MD [id number] [phone]

## 2015-01-14 NOTE — ED Notes (Signed)
Pt arrives from [facility] via [facility]. Pt states he woke up yesterday with CP, heart palpitations, and dizziness. Pt had inverted T waves upon arrival to [facility] and was sent here. Pt is currently pain free, pt states pressure went away yesterday afternoon.

## 2015-01-14 NOTE — ED Notes (Signed)
PA Eulas Post states patient is allowed to eat until midnight tonight; snack pack provided

## 2015-01-14 NOTE — Progress Notes (Signed)
ANTICOAGULATION CONSULT NOTE - Initial Consult  Pharmacy Consult for heparin Indication: chest pain/ACS  Allergies  Allergen Reactions  . Aspirin Other (See Comments)    unknown  . Iodine     REACTION: Reaction not known  . Penicillins Other (See Comments)    unknown  . Shellfish Allergy   . Ace Inhibitors Cough    Patient Measurements: Height: 6' (182.9 cm) Weight: 204 lb (92.534 kg) IBW/kg (Calculated) : 77.6 Heparin Dosing Weight: 92kg  Vital Signs: Temp: 97.9 F (36.6 C) (12/06 1752) Temp Source: Oral (12/06 1752) BP: 141/97 mmHg (12/06 1914) Pulse Rate: 94 (12/06 1914)  Labs:  Recent Labs  01/14/15 1810  HGB 15.4  HCT 45.0  PLT 176  CREATININE 0.93  TROPONINI 13.90*    Estimated Creatinine Clearance: 106.6 mL/min (by C-G formula based on Cr of 0.93).   Medical History: Past Medical History  Diagnosis Date  . Hyperlipidemia   . Hypertension   . GERD (gastroesophageal reflux disease)   . Atypical chest pain     negative cardiac work up 2010  . Testicle cancer ([place])     followed by Dr Risa Grill  . History of migraines   . Blood pressure elevated without history of HTN   . Food allergy     anaphylaxis reaction with shell fish    Medications:  Infusions:  . heparin      Assessment: 70 yom presented to the ED with CP. Troponin elevated. To start IV heparin. Baseline H/H + platelets are WNL. He is not on anticoagulation PTA.   Goal of Therapy:  Heparin level 0.3-0.7 units/ml Monitor platelets by anticoagulation protocol: Yes   Plan:  - Heparin bolus 4000 units IV x 1 - Heparin gtt 1200 units/hr - Check a 6 hour heparin level - Daily heparin level and CBC  Cesar Woods, Cesar Woods 01/14/2015,7:30 PM

## 2015-01-14 NOTE — ED Notes (Signed)
Attending, MD at bedside.

## 2015-01-14 NOTE — H&P (Addendum)
Primary cardiologist: Dr. Kirk Woods (2010) Primary care: Dr. Scarlette Woods  Reason for admission: ECG consistent with recent STEMI, indigestion  Clinical Summary Cesar Woods is a 49 y.o.male with past medical history outlined below referred today to the ER by Dr. Claiborne Woods due to abnormal ECG and recent worsening indigestion. He reports a long-standing history of reflux, has used Prilosec and Tums for years, but had a more significant episode of substernal chest discomfort yesterday morning when he woke up. He thought that it was possibly indigestion, but he did state that it was more intense and also associated with discomfort in his arms. He contacted his primary care provider, took additional Tums, and states that eventually his symptoms went away after several hours. He had been encouraged to seek medical attention if his symptoms did not improve, also reiterated by his wife, but he states that he felt better later in the day with the exception of a vague feeling of palpitations. This morning he got up and went to work as usual, had not experienced any significant indigestion or chest discomfort whatsoever, but still felt intermittent palpitations. He went in this afternoon to see Dr. Claiborne Woods at which time an ECG was obtained consistent with recent anterolateral STEMI. He was sent to the ER via EMS.  On evaluation in the ER, he has been hemodynamically stable without any chest discomfort at all, no shortness of breath at rest, no palpitations. Also hemodynamically stable, mildly hypertensive, heart rate in the 90s. Troponin I level obtained an abnormal at 13.9, WBC 11.7. Chest x-ray shows no acute findings.  Patient works in Engineer, mining, fairly stressful job. He does not report any recent exertional chest discomfort, only recurring indigestion symptoms that have typically resolved with anti-reflex measures. He reports some shortness of breath with activity over the last few years, but no escalating  pattern. He had a previous cardiac workup with Dr. Stanford Woods back in 2010 at which time Myoview study was low risk as outlined below. Patient's father had heart disease and underwent CABG at age 79.  He has a history of bilateral testicular cancer, underwent orchiectomy years ago and radiation, has been cancer free for 13 years by report. He has been on testosterone replacement.  Also reported history of mild hyperlipemia, but not on statin therapy.  Allergies include iodine and shellfish, reports anaphylaxis.   Allergies  Allergen Reactions  . Aspirin Other (See Comments)    unknown  . Iodine     REACTION: Reaction not known  . Penicillins Other (See Comments)    unknown  . Shellfish Allergy   . Ace Inhibitors Cough    Home Medications No current facility-administered medications on file prior to encounter.   Current Outpatient Prescriptions on File Prior to Encounter  Medication Sig Dispense Refill  . amLODipine (NORVASC) 2.5 MG tablet TAKE 1 TABLET (2.5 MG TOTAL) BY MOUTH DAILY. 90 tablet 0  . EPIPEN 2-PAK 0.3 MG/0.3ML SOAJ injection INJECT 0.3 MLS INTO THE MUSCLE ONCE **PATIENT NEEDS TO SCHEDULE AN OFFICE VISIT** 2 Device 0  . losartan (COZAAR) 50 MG tablet TAKE 1 TABLET (50 MG TOTAL) BY MOUTH DAILY. 90 tablet 0  . omeprazole (PRILOSEC) 20 MG capsule Take 20 mg by mouth daily.    . Testosterone (ANDROGEL) 40.5 MG/2.5GM (1.62%) GEL Place 5 Units onto the skin daily. 225 g 0    Past Medical History  Diagnosis Date  . Hyperlipidemia   . Essential hypertension   . GERD (gastroesophageal reflux disease)   .  Atypical chest pain     Negative Myoview 2010  . Testicle cancer ([place])     In remission - followed by Dr. Risa Woods; had bilateral orchiectomy and XRT in past  . History of migraines   . Food allergy     Anaphylaxis with shell fish    Past Surgical History  Procedure Laterality Date  . Orchiectomy Bilateral     Family History  Problem Relation Age of Onset  .  Stroke Paternal Grandfather   . Heart attack Father     CABG at agae 43  . Hypertension    . Hyperlipidemia    . Heart attack Maternal Grandmother     Social History Cesar Woods reports that he has quit smoking. He does not have any smokeless tobacco history on file. Cesar Woods reports that he drinks alcohol.  Review of Systems Complete review of systems negative except as otherwise outlined in the clinical summary and also the following. Frequent reflux symptoms. No reported bleeding diathesis.  Physical Examination Temp:  [97.9 F (36.6 C)] 97.9 F (36.6 C) (12/06 1752) Pulse Rate:  [93-98] 94 (12/06 1914) Resp:  [17-26] 24 (12/06 1914) BP: (130-155)/(96-99) 141/97 mmHg (12/06 1914) SpO2:  [97 %-98 %] 97 % (12/06 1914) Weight:  [204 lb (92.534 kg)] 204 lb (92.534 kg) (12/06 1752) No intake or output data in the 24 hours ending 01/14/15 2016  Telemetry: Sinus rhythm.  Gen.: Patient appears quite comfortable at rest, no chest pain or breathlessness. HEENT: Conjunctiva and lids normal, oropharynx clear. Neck: Supple, no elevated JVP or carotid bruits, no thyromegaly. Lungs: Clear to auscultation, nonlabored breathing at rest. Cardiac: Regular rate and rhythm, S3 noted, no significant systolic murmur, no pericardial rub. Abdomen: Soft, nontender, bowel sounds present, no guarding or rebound. Extremities: No pitting edema, distal pulses 2+. Skin: Warm and dry. Musculoskeletal: No kyphosis. Neuropsychiatric: Alert and oriented x3, affect grossly appropriate.  Lab Results  Basic Metabolic Panel:  Recent Labs Lab 01/14/15 1810  NA 138  K 4.1  CL 102  CO2 25  GLUCOSE 97  BUN 15  CREATININE 0.93  CALCIUM 9.1   Liver Function Tests:  Recent Labs Lab 01/14/15 1810  AST 82*  ALT 34  ALKPHOS 74  BILITOT 1.6*  PROT 7.0  ALBUMIN 4.1   CBC:  Recent Labs Lab 01/14/15 1810  WBC 11.7*  NEUTROABS 7.8*  HGB 15.4  HCT 45.0  MCV 87.0  PLT 176   Cardiac  Enzymes:  Recent Labs Lab 01/14/15 1810  TROPONINI 13.90*   Imaging  Chest x-ray 01/14/2015: FINDINGS: The heart size and mediastinal contours are within normal limits. Both lungs are clear. The visualized skeletal structures are unremarkable.  IMPRESSION: No active disease.  Impression  1. Recent worsening indigestion symptoms with ECG consistent with late presentation anterolateral STEMI, currently completely chest pain-free and hemodynamically stable. Suspected event based on symptom description was early yesterday morning. Troponin I level 13.9 at this time. No active heart failure with clear chest x-ray.  2. History of essential hypertension, on Norvasc and Cozaar as an outpatient.  3. History of hyperlipidemia, reportedly mild, not on statin therapy as an outpatient.  4. Family history of premature CAD in the patient's father who underwent CABG at age 12.  89. Reported allergies to iodine and shellfish, anaphylaxis.  6. History of bilateral testicular cancer status post orchiectomy and radiation, in remission for 13 years. He has been on testosterone supplementation.   Recommendations  Reviewed records and discussed the  case with the patient and his wife. Plan is to admit him to the CCU on aspirin, heparin, beta blocker, statin, and continue Cozaar for now. Continue to cycle cardiac markers. If he has any further chest pain, would plan to contact cardiac catheterization lab for urgent coronary angiography overnight. Otherwise since he is completely symptom free at this time, will place him on the schedule for procedure tomorrow AM, pretreating for iodine and shellfish anaphylaxis allergies as discussed above. Recheck FLP. Order echocardiogram.  Satira Sark, M.D., F.A.C.C.

## 2015-01-15 ENCOUNTER — Encounter (HOSPITAL_COMMUNITY)
Admission: EM | Disposition: A | Discharge status: Home / Self Care | Payer: Self-pay | Source: Home / Self Care | Attending: Cardiology

## 2015-01-15 ENCOUNTER — Encounter (HOSPITAL_COMMUNITY): Payer: Self-pay | Admitting: *Deleted

## 2015-01-15 ENCOUNTER — Inpatient Hospital Stay (HOSPITAL_COMMUNITY): Payer: BLUE CROSS/BLUE SHIELD

## 2015-01-15 DIAGNOSIS — I2109 ST elevation (STEMI) myocardial infarction involving other coronary artery of anterior wall: Secondary | ICD-10-CM | POA: Diagnosis present

## 2015-01-15 DIAGNOSIS — I249 Acute ischemic heart disease, unspecified: Secondary | ICD-10-CM | POA: Diagnosis present

## 2015-01-15 DIAGNOSIS — I214 Non-ST elevation (NSTEMI) myocardial infarction: Secondary | ICD-10-CM

## 2015-01-15 DIAGNOSIS — I2510 Atherosclerotic heart disease of native coronary artery without angina pectoris: Secondary | ICD-10-CM

## 2015-01-15 DIAGNOSIS — I213 ST elevation (STEMI) myocardial infarction of unspecified site: Secondary | ICD-10-CM

## 2015-01-15 HISTORY — PX: CARDIAC CATHETERIZATION: SHX172

## 2015-01-15 LAB — TROPONIN I
TROPONIN I: 3.2 ng/mL — AB (ref <0.031)
Troponin I: 7.71 ng/mL (ref <0.031)
Troponin I: 3.24 ng/mL (ref <0.031)

## 2015-01-15 LAB — CBC
HCT: 47.2 % (ref 39.0–52.0)
Hemoglobin: 16.3 g/dL (ref 13.0–17.0)
MCH: 30 pg (ref 26.0–34.0)
MCHC: 34.5 g/dL (ref 30.0–36.0)
MCV: 86.8 fL (ref 78.0–100.0)
Platelets: 210 10*3/uL (ref 150–400)
RBC: 5.44 MIL/uL (ref 4.22–5.81)
RDW: 13 % (ref 11.5–15.5)
WBC: 9.1 10*3/uL (ref 4.0–10.5)

## 2015-01-15 LAB — PLATELET COUNT: Platelets: 210 10*3/uL (ref 150–400)

## 2015-01-15 LAB — PROTIME-INR
INR: 1.07 (ref 0.00–1.49)
Prothrombin Time: 14.1 seconds (ref 11.6–15.2)

## 2015-01-15 LAB — POCT ACTIVATED CLOTTING TIME: ACTIVATED CLOTTING TIME: 703 s

## 2015-01-15 LAB — LIPID PANEL
CHOL/HDL RATIO: 6.4 ratio
CHOLESTEROL: 236 mg/dL — AB (ref 0–200)
HDL: 37 mg/dL — ABNORMAL LOW (ref >40)
LDL Cholesterol: 176 mg/dL — ABNORMAL HIGH (ref 0–99)
TRIGLYCERIDES: 114 mg/dL (ref <150)
VLDL: 23 mg/dL (ref 0–40)

## 2015-01-15 LAB — MRSA PCR SCREENING: MRSA BY PCR: NEGATIVE

## 2015-01-15 LAB — HEPARIN LEVEL (UNFRACTIONATED): Heparin Unfractionated: <0.1 IU/mL — ABNORMAL LOW (ref 0.30–0.70)

## 2015-01-15 SURGERY — LEFT HEART CATH AND CORONARY ANGIOGRAPHY
Anesthesia: LOCAL

## 2015-01-15 MED ORDER — VERAPAMIL HCL 2.5 MG/ML IV SOLN
INTRAVENOUS | Status: AC
  Filled 2015-01-15: qty 2

## 2015-01-15 MED ORDER — MIDAZOLAM HCL 2 MG/2ML IJ SOLN
INTRAMUSCULAR | Status: AC
  Filled 2015-01-15: qty 2

## 2015-01-15 MED ORDER — HEPARIN BOLUS VIA INFUSION
3000.0000 [IU] | Freq: Once | INTRAVENOUS | Status: AC
  Administered 2015-01-15: 3000 [IU] via INTRAVENOUS
  Filled 2015-01-15: qty 3000

## 2015-01-15 MED ORDER — HEPARIN (PORCINE) IN NACL 2-0.9 UNIT/ML-% IJ SOLN
INTRAMUSCULAR | Status: AC
  Filled 2015-01-15: qty 1000

## 2015-01-15 MED ORDER — SODIUM CHLORIDE 0.9 % IJ SOLN: 3.0000 mL | INTRAMUSCULAR | Status: DC | PRN

## 2015-01-15 MED ORDER — NITROGLYCERIN IN D5W 200-5 MCG/ML-% IV SOLN: 0.0000 ug/min | INTRAVENOUS | Status: DC

## 2015-01-15 MED ORDER — SODIUM CHLORIDE 0.9 % IV SOLN: 250.0000 mL | INTRAVENOUS | Status: DC | PRN

## 2015-01-15 MED ORDER — NITROGLYCERIN 1 MG/10 ML FOR IR/CATH LAB
INTRA_ARTERIAL | Status: DC | PRN
  Administered 2015-01-15: 12:00:00

## 2015-01-15 MED ORDER — TICAGRELOR 90 MG PO TABS
ORAL_TABLET | ORAL | Status: DC | PRN
Start: 2015-01-15 — End: 2015-01-15
  Administered 2015-01-15: 180 mg via ORAL

## 2015-01-15 MED ORDER — ACETAMINOPHEN 325 MG PO TABS
650.0000 mg | ORAL_TABLET | ORAL | Status: DC | PRN
  Administered 2015-01-15 – 2015-01-18 (×4): 650 mg via ORAL
  Filled 2015-01-15 (×4): qty 2

## 2015-01-15 MED ORDER — BIVALIRUDIN 250 MG IV SOLR
250.0000 mg | INTRAVENOUS | Status: DC | PRN
  Administered 2015-01-15 (×3): 1.75 mg/kg/h via INTRAVENOUS

## 2015-01-15 MED ORDER — SODIUM CHLORIDE 0.9 % IV SOLN
INTRAVENOUS | Status: DC
  Administered 2015-01-15: 21:00:00 via INTRAVENOUS

## 2015-01-15 MED ORDER — MIDAZOLAM HCL 2 MG/2ML IJ SOLN
INTRAMUSCULAR | Status: DC | PRN
  Administered 2015-01-15: 2 mg via INTRAVENOUS
  Administered 2015-01-15 (×4): 1 mg via INTRAVENOUS

## 2015-01-15 MED ORDER — BIVALIRUDIN BOLUS VIA INFUSION - CUPID
INTRAVENOUS | Status: DC | PRN
  Administered 2015-01-15: 67.95 mg via INTRAVENOUS

## 2015-01-15 MED ORDER — LIDOCAINE HCL (PF) 1 % IJ SOLN
INTRAMUSCULAR | Status: AC
  Filled 2015-01-15: qty 30

## 2015-01-15 MED ORDER — FENTANYL CITRATE (PF) 100 MCG/2ML IJ SOLN
INTRAMUSCULAR | Status: DC | PRN
  Administered 2015-01-15 (×3): 25 ug via INTRAVENOUS
  Administered 2015-01-15: 50 ug via INTRAVENOUS

## 2015-01-15 MED ORDER — HEPARIN SODIUM (PORCINE) 1000 UNIT/ML IJ SOLN
INTRAMUSCULAR | Status: DC | PRN
  Administered 2015-01-15: 5000 [IU] via INTRAVENOUS

## 2015-01-15 MED ORDER — ASPIRIN EC 81 MG PO TBEC
81.0000 mg | DELAYED_RELEASE_TABLET | Freq: Every day | ORAL | Status: DC
  Administered 2015-01-16 – 2015-01-18 (×3): 81 mg via ORAL
  Filled 2015-01-15 (×4): qty 1

## 2015-01-15 MED ORDER — TICAGRELOR 90 MG PO TABS
ORAL_TABLET | ORAL | Status: AC
  Filled 2015-01-15: qty 2

## 2015-01-15 MED ORDER — BIVALIRUDIN 250 MG IV SOLR
INTRAVENOUS | Status: AC
Start: 2015-01-15 — End: 2015-01-15
  Filled 2015-01-15: qty 250

## 2015-01-15 MED ORDER — NITROGLYCERIN 1 MG/10 ML FOR IR/CATH LAB
INTRA_ARTERIAL | Status: DC | PRN
  Administered 2015-01-15: 200 ug via INTRACORONARY
  Administered 2015-01-15: 100 ug via INTRACORONARY
  Administered 2015-01-15: 200 ug via INTRA_ARTERIAL

## 2015-01-15 MED ORDER — FENTANYL CITRATE (PF) 100 MCG/2ML IJ SOLN
INTRAMUSCULAR | Status: AC
  Filled 2015-01-15: qty 2

## 2015-01-15 MED ORDER — SODIUM CHLORIDE 0.9 % IV SOLN
INTRAVENOUS | Status: DC
  Administered 2015-01-15: 150 mL/h via INTRAVENOUS

## 2015-01-15 MED ORDER — NITROGLYCERIN 1 MG/10 ML FOR IR/CATH LAB
INTRA_ARTERIAL | Status: AC
  Filled 2015-01-15: qty 10

## 2015-01-15 MED ORDER — LIDOCAINE HCL (PF) 1 % IJ SOLN
INTRAMUSCULAR | Status: DC | PRN
  Administered 2015-01-15: 1 mL via INTRADERMAL

## 2015-01-15 MED ORDER — NITROGLYCERIN IN D5W 200-5 MCG/ML-% IV SOLN
INTRAVENOUS | Status: DC | PRN
  Administered 2015-01-15: 10 ug/min via INTRAVENOUS

## 2015-01-15 MED ORDER — BIVALIRUDIN 250 MG IV SOLR
INTRAVENOUS | Status: AC
  Filled 2015-01-15: qty 250

## 2015-01-15 MED ORDER — HEPARIN (PORCINE) IN NACL 100-0.45 UNIT/ML-% IJ SOLN
1650.0000 [IU]/h | INTRAMUSCULAR | Status: DC
  Administered 2015-01-15: 1500 [IU]/h via INTRAVENOUS

## 2015-01-15 MED ORDER — SODIUM CHLORIDE 0.9 % IJ SOLN
3.0000 mL | Freq: Two times a day (BID) | INTRAMUSCULAR | Status: DC
  Administered 2015-01-16 – 2015-01-17 (×2): 3 mL via INTRAVENOUS

## 2015-01-15 MED ORDER — LIDOCAINE HCL (PF) 1 % IJ SOLN
INTRAMUSCULAR | Status: DC | PRN
  Administered 2015-01-15: 20 mL via INTRA_ARTERIAL

## 2015-01-15 MED ORDER — IOHEXOL 350 MG/ML SOLN
INTRAVENOUS | Status: DC | PRN
  Administered 2015-01-15: 475 mL via INTRACARDIAC

## 2015-01-15 MED ORDER — HEPARIN SODIUM (PORCINE) 1000 UNIT/ML IJ SOLN
INTRAMUSCULAR | Status: AC
  Filled 2015-01-15: qty 1

## 2015-01-15 MED ORDER — AMLODIPINE BESYLATE 2.5 MG PO TABS
2.5000 mg | ORAL_TABLET | Freq: Every day | ORAL | Status: DC
  Administered 2015-01-15 (×2): 2.5 mg via ORAL
  Filled 2015-01-15 (×2): qty 1

## 2015-01-15 MED ORDER — VERAPAMIL HCL 2.5 MG/ML IV SOLN
INTRAVENOUS | Status: DC | PRN
  Administered 2015-01-15: 200 ug via INTRACORONARY
  Administered 2015-01-15: 100 ug via INTRACORONARY

## 2015-01-15 MED ORDER — TICAGRELOR 90 MG PO TABS
90.0000 mg | ORAL_TABLET | Freq: Two times a day (BID) | ORAL | Status: DC
  Administered 2015-01-15 – 2015-01-18 (×6): 90 mg via ORAL
  Filled 2015-01-15 (×6): qty 1

## 2015-01-15 MED ORDER — ONDANSETRON HCL 4 MG/2ML IJ SOLN: 4.0000 mg | Freq: Four times a day (QID) | INTRAMUSCULAR | Status: DC | PRN

## 2015-01-15 MED ORDER — ATORVASTATIN CALCIUM 80 MG PO TABS
80.0000 mg | ORAL_TABLET | Freq: Every day | ORAL | Status: DC
  Administered 2015-01-15 – 2015-01-16 (×2): 80 mg via ORAL
  Filled 2015-01-15 (×2): qty 1

## 2015-01-15 MED ORDER — NITROGLYCERIN IN D5W 200-5 MCG/ML-% IV SOLN
INTRAVENOUS | Status: AC
  Filled 2015-01-15: qty 250

## 2015-01-15 SURGICAL SUPPLY — 32 items
BALLN EUPHORA RX 2.0X25 (BALLOONS) ×3
BALLN EUPHORA RX 2.5X12 (BALLOONS) ×3
BALLN EUPHORA RX 2.5X20 (BALLOONS) ×3
BALLN EUPHORA RX 3.0X20 (BALLOONS) ×3
BALLN ~~LOC~~ EUPHORA RX 3.25X27 (BALLOONS) ×3
BALLOON EUPHORA RX 2.0X25 (BALLOONS) IMPLANT
BALLOON EUPHORA RX 2.5X12 (BALLOONS) IMPLANT
BALLOON EUPHORA RX 2.5X20 (BALLOONS) IMPLANT
BALLOON EUPHORA RX 3.0X20 (BALLOONS) IMPLANT
BALLOON ~~LOC~~ EUPHORA RX 3.25X27 (BALLOONS) IMPLANT
CATH INFINITI 5FR ANG PIGTAIL (CATHETERS) ×3 IMPLANT
CATH INFINITI JR4 5F (CATHETERS) ×1 IMPLANT
CATH OPTITORQUE TIG 4.0 5F (CATHETERS) ×3 IMPLANT
CATH VISTA GUIDE 6FR XBLAD3.5 (CATHETERS) ×1 IMPLANT
DEVICE RAD COMP TR BAND LRG (VASCULAR PRODUCTS) ×3 IMPLANT
GLIDESHEATH SLEND A-KIT 6F 22G (SHEATH) ×3 IMPLANT
KIT ENCORE 26 ADVANTAGE (KITS) ×1 IMPLANT
KIT HEART LEFT (KITS) ×3 IMPLANT
NDL PERC 21GX4CM (NEEDLE) IMPLANT
NEEDLE PERC 21GX4CM (NEEDLE) ×3 IMPLANT
PACK CARDIAC CATHETERIZATION (CUSTOM PROCEDURE TRAY) ×3 IMPLANT
STENT SYNERGY DES 2.5X38 (Permanent Stent) ×1 IMPLANT
STENT SYNERGY DES 3X20 (Permanent Stent) ×1 IMPLANT
STENT SYNERGY DES 3X38 (Permanent Stent) ×1 IMPLANT
SYR MEDRAD MARK V 150ML (SYRINGE) ×3 IMPLANT
TRANSDUCER W/STOPCOCK (MISCELLANEOUS) ×3 IMPLANT
TUBING CIL FLEX 10 FLL-RA (TUBING) ×3 IMPLANT
WIRE COUGAR XT STRL 190CM (WIRE) ×1 IMPLANT
WIRE HI TORQ WHISPER MS 190CM (WIRE) ×1 IMPLANT
WIRE MICROINTRODUCER 60CM (WIRE) ×1 IMPLANT
WIRE PT2 MS 185 (WIRE) ×1 IMPLANT
WIRE SAFE-T 1.5MM-J .035X260CM (WIRE) ×4 IMPLANT

## 2015-01-15 NOTE — Progress Notes (Signed)
CRITICAL VALUE ALERT  Critical value received:  Troponin 3.24 and Troponin 3.20  Date of notification: 01/15/15  Time of notification: 13:48 and 15:45  Critical value read back:No. Lab didn't call.  MD notified (1st page):  [place], [place]  Time of first page:18:11

## 2015-01-15 NOTE — Progress Notes (Signed)
  Echocardiogram 2D Echocardiogram has been performed.  Cesar Woods 01/15/2015, 4:28 PM

## 2015-01-15 NOTE — Interval H&P Note (Signed)
History and Physical Interval Note:  01/15/2015 7:59 AM  Cesar Woods  has presented today for surgery, with the diagnosis of NSTEMI (completed Missed Anterior STEMI)  The various methods of treatment have been discussed with the patient and family. After consideration of risks, benefits and other options for treatment, the patient has consented to  Procedure(s): Left Heart Cath and Coronary Angiography (N/A) as a surgical intervention .  The patient's history has been reviewed, patient examined, no change in status, stable for surgery.  I have reviewed the patient's chart and labs.  Questions were answered to the patient's satisfaction.     Antonio Creswell W

## 2015-01-15 NOTE — Progress Notes (Signed)
Pt. Went to cath lab at 0800 this am.  Heparin turned off at 0800.

## 2015-01-15 NOTE — Progress Notes (Signed)
ANTICOAGULATION CONSULT NOTE - Follow Up Consult  Pharmacy Consult for Heparin  Indication: chest pain/ACS  Allergies  Allergen Reactions  . Shellfish Allergy Anaphylaxis  . Iodine     REACTION: Reaction not known  . Penicillins Other (See Comments)    unknown  . Ace Inhibitors Cough    Patient Measurements: Height: 6' (182.9 cm) Weight: 199 lb 11.8 oz (90.6 kg) IBW/kg (Calculated) : 77.6  Vital Signs: Temp: 97.8 F (36.6 C) (12/07 0000) Temp Source: Oral (12/07 0000) BP: 127/91 mmHg (12/07 0100) Pulse Rate: 89 (12/07 0100)  Labs:  Recent Labs  01/14/15 1810 01/14/15 2253 01/15/15 0132  HGB 15.4  --   --   HCT 45.0  --   --   PLT 176  --   --   HEPARINUNFRC  --   --  <0.10*  CREATININE 0.93  --   --   TROPONINI 13.90* 11.21*  --     Estimated Creatinine Clearance: 106.6 mL/min (by C-G formula based on Cr of 0.93).   Assessment: Initial heparin level is sub-therapeutic, cath today  Goal of Therapy:  Heparin level 0.3-0.7 units/ml Monitor platelets by anticoagulation protocol: Yes   Plan:  -Heparin 3000 units BOLUS -Increase heparin to 1500 units/hr -1000 HL -Daily CBC/HL -Monitor for bleeding  Narda Bonds 01/15/2015,2:54 AM

## 2015-01-15 NOTE — Progress Notes (Signed)
[place] for heparin Indication: chest pain/ACS  Allergies  Allergen Reactions  . Shellfish Allergy Anaphylaxis  . Iodine     REACTION: Reaction not known  . Penicillins Other (See Comments)    unknown  . Ace Inhibitors Cough    Patient Measurements: Height: 6' (182.9 cm) Weight: 199 lb 11.8 oz (90.6 kg) IBW/kg (Calculated) : 77.6 Heparin Dosing Weight: 92kg  Vital Signs: Temp: 97.7 F (36.5 C) (12/07 1230) Temp Source: Oral (12/07 1230) BP: 113/82 mmHg (12/07 1400) Pulse Rate: 96 (12/07 1400)  Labs:  Recent Labs  01/14/15 1810 01/14/15 2253 01/15/15 0132 01/15/15 0447 01/15/15 1348  HGB 15.4  --   --  16.3  --   HCT 45.0  --   --  47.2  --   PLT 176  --   --  210  --   LABPROT  --   --   --  14.1  --   INR  --   --   --  1.07  --   HEPARINUNFRC  --   --  <0.10*  --   --   CREATININE 0.93  --   --   --   --   TROPONINI 13.90* 11.21*  --  7.71* 3.24*    Estimated Creatinine Clearance: 106.6 mL/min (by C-G formula based on Cr of 0.93).   Medical History: Past Medical History  Diagnosis Date  . Hyperlipidemia   . Essential hypertension   . GERD (gastroesophageal reflux disease)   . Atypical chest pain     Negative Myoview 2010  . Testicle cancer ([place])     In remission - followed by Dr. Risa Grill; had bilateral orchiectomy and XRT in past  . History of migraines   . Food allergy     Anaphylaxis with shell fish   Assessment: 89 yom presented to the ED with CP. Troponin elevated.   Patient s/p cath with multiple stents placed. Angiomax now off. Orders to restart heparin 6 hours post TR band removal.   hgb normal at 14 this morning, no overt bleeding/hematoma noted post cath.  Goal of Therapy:  Heparin level 0.3-0.7 units/ml Monitor platelets by anticoagulation protocol: Yes   Plan:  Restart heparin at 1500 units/hr>>restart at 2300 6 hour HL then daily with CBC  Erin Hearing PharmD., BCPS Clinical  Pharmacist Pager [phone] 01/15/2015 3:26 PM

## 2015-01-15 NOTE — Care Management Note (Signed)
Case Management Note  Patient Details  Name: Cesar Woods MRN: [id number] Date of Birth: 1965-04-14  Subjective/Objective:       Adm w mi             Action/Plan: lives w fam  Expected Discharge Date:                  Expected Discharge Plan:  Home/Self Care  In-House Referral:     Discharge planning Services  CM Consult, Medication Assistance  Post Acute Care Choice:    Choice offered to:     DME Arranged:    DME Agency:     HH Arranged:    [place] Agency:     Status of Service:     Medicare Important Message Given:    Date Medicare IM Given:    Medicare IM give by:    Date Additional Medicare IM Given:    Additional Medicare Important Message give by:     If discussed at [place] of Stay Meetings, dates discussed:    Additional Comments:gave pt 30day free and copay assist card for brilinta  Lacretia Leigh, RN 01/15/2015, 1:52 PM

## 2015-01-15 NOTE — Progress Notes (Signed)
Pt. Returned from cath lab at approx. 12:15. [name] spoke with family and patient explaining how the procedure went. Will continue to monitor.

## 2015-01-15 NOTE — Care Management Note (Signed)
Case Management Note  Patient Details  Name: Cesar Woods MRN: [id number] Date of Birth: Feb 22, 1965  Subjective/Objective:      Adm w mi              Action/Plan:lives w fam, pcp dr Nilda Simmer   Expected Discharge Date:                  Expected Discharge Plan:     In-House Referral:     Discharge planning Services     Post Acute Care Choice:    Choice offered to:     DME Arranged:    DME Agency:     HH Arranged:    [place] Agency:     Status of Service:     Medicare Important Message Given:    Date Medicare IM Given:    Medicare IM give by:    Date Additional Medicare IM Given:    Additional Medicare Important Message give by:     If discussed at [place] of Stay Meetings, dates discussed:    Additional Comments: ur review done  Lacretia Leigh, RN 01/15/2015, 7:52 AM

## 2015-01-15 NOTE — Interval H&P Note (Signed)
Cath Lab Visit (complete for each Cath Lab visit)  Clinical Evaluation Leading to the Procedure:   ACS: Yes.    Non-ACS:    Anginal Classification: CCS IV  Anti-ischemic medical therapy: Maximal Therapy (2 or more classes of medications)  Non-Invasive Test Results: No non-invasive testing performed  Prior CABG: No previous CABG      History and Physical Interval Note:  01/15/2015 8:27 AM  Cesar Woods  has presented today for surgery, with the diagnosis of Chest Pain   The various methods of treatment have been discussed with the patient and family. After consideration of risks, benefits and other options for treatment, the patient has consented to  Procedure(s): Left Heart Cath and Coronary Angiography (N/A) as a surgical intervention .  The patient's history has been reviewed, patient examined, no change in status, stable for surgery.  I have reviewed the patient's chart and labs.  Questions were answered to the patient's satisfaction.     KELLY,THOMAS A

## 2015-01-16 ENCOUNTER — Encounter (HOSPITAL_COMMUNITY): Payer: Self-pay | Admitting: Cardiovascular Disease

## 2015-01-16 DIAGNOSIS — I5042 Chronic combined systolic (congestive) and diastolic (congestive) heart failure: Secondary | ICD-10-CM | POA: Diagnosis not present

## 2015-01-16 DIAGNOSIS — I5021 Acute systolic (congestive) heart failure: Secondary | ICD-10-CM

## 2015-01-16 DIAGNOSIS — I110 Hypertensive heart disease with heart failure: Secondary | ICD-10-CM

## 2015-01-16 LAB — CBC
HCT: 38.3 % — ABNORMAL LOW (ref 39.0–52.0)
Hemoglobin: 12.6 g/dL — ABNORMAL LOW (ref 13.0–17.0)
MCH: 29 pg (ref 26.0–34.0)
MCHC: 32.9 g/dL (ref 30.0–36.0)
MCV: 88 fL (ref 78.0–100.0)
Platelets: 188 10*3/uL (ref 150–400)
RBC: 4.35 MIL/uL (ref 4.22–5.81)
RDW: 13.1 % (ref 11.5–15.5)
WBC: 15.8 10*3/uL — ABNORMAL HIGH (ref 4.0–10.5)

## 2015-01-16 LAB — BASIC METABOLIC PANEL
Anion gap: 8 (ref 5–15)
BUN: 10 mg/dL (ref 6–20)
CALCIUM: 8.3 mg/dL — AB (ref 8.9–10.3)
CHLORIDE: 106 mmol/L (ref 101–111)
CO2: 23 mmol/L (ref 22–32)
CREATININE: 0.74 mg/dL (ref 0.61–1.24)
GFR calc non Af Amer: >60 mL/min (ref >60)
Glucose, Bld: 124 mg/dL — ABNORMAL HIGH (ref 65–99)
Potassium: 3.9 mmol/L (ref 3.5–5.1)
SODIUM: 137 mmol/L (ref 135–145)

## 2015-01-16 LAB — HEMOGLOBIN A1C
HEMOGLOBIN A1C: 5.9 % — AB (ref 4.8–5.6)
Mean Plasma Glucose: 123 mg/dL

## 2015-01-16 LAB — HEPARIN LEVEL (UNFRACTIONATED): HEPARIN UNFRACTIONATED: 0.25 [IU]/mL — AB (ref 0.30–0.70)

## 2015-01-16 MED ORDER — TESTOSTERONE 50 MG/5GM (1%) TD GEL
10.0000 g | Freq: Every day | TRANSDERMAL | Status: DC
  Administered 2015-01-16: 5 g via TRANSDERMAL
  Administered 2015-01-17 – 2015-01-18 (×2): 10 g via TRANSDERMAL
  Filled 2015-01-16 (×3): qty 10

## 2015-01-16 MED ORDER — PANTOPRAZOLE SODIUM 40 MG PO TBEC
40.0000 mg | DELAYED_RELEASE_TABLET | Freq: Every day | ORAL | Status: DC
  Administered 2015-01-16 – 2015-01-18 (×3): 40 mg via ORAL
  Filled 2015-01-16 (×3): qty 1

## 2015-01-16 NOTE — Progress Notes (Signed)
CARDIAC REHAB PHASE I   PRE:  Rate/Rhythm: 95 SR    BP: sitting 116/85    SaO2: 98 RA  MODE:  Ambulation: 350 ft   POST:  Rate/Rhythm: 112 ST    BP: sitting 118/92     SaO2: 99 RA  Pt feels 1/4 SOB in bed. Steady walking, HR up to 112 ST with increased SOB to 3/4. Had to rest x1. Felt better with rest. To recliner. Pt generally anxious. Wife present and very supportive. Ed completed. Understands importance of Brilinta/ASA. Eager to do CRPII and will send referral to [place]. Will f/u for teach back. Encouraged more walking and videos. [id number]  Josephina Shih [place] CES, ACSM 01/16/2015 11:21 AM

## 2015-01-16 NOTE — Progress Notes (Signed)
EKG CRITICAL VALUE     12 lead EKG performed.  Critical value noted. Golden Hurter,, RN notified.   Asencion Gowda, CCT 01/16/2015 6:37 AM

## 2015-01-16 NOTE — Progress Notes (Signed)
ANTICOAGULATION CONSULT NOTE - Follow Up Consult  Pharmacy Consult for Heparin  Indication: chest pain/ACS  Allergies  Allergen Reactions  . Shellfish Allergy Anaphylaxis  . Iodine     REACTION: Reaction not known  . Penicillins Other (See Comments)    unknown  . Ace Inhibitors Cough    Patient Measurements: Height: 6' (182.9 cm) Weight: 194 lb 0.1 oz (88 kg) IBW/kg (Calculated) : 77.6  Vital Signs: Temp: 98.2 F (36.8 C) (12/08 0400) Temp Source: Oral (12/08 0400) BP: 107/86 mmHg (12/08 0500) Pulse Rate: 91 (12/08 0500)  Labs:  Recent Labs  01/14/15 1810  01/15/15 0132 01/15/15 0447 01/15/15 1348 01/15/15 1545 01/15/15 1827 01/16/15 0505  HGB 15.4  --   --  16.3  --   --   --  12.6*  HCT 45.0  --   --  47.2  --   --   --  38.3*  PLT 176  --   --  210  --   --  210 188  LABPROT  --   --   --  14.1  --   --   --   --   INR  --   --   --  1.07  --   --   --   --   HEPARINUNFRC  --   --  <0.10*  --   --   --   --  0.25*  CREATININE 0.93  --   --   --   --   --   --   --   TROPONINI 13.90*  < >  --  7.71* 3.24* 3.20*  --   --   < > = values in this interval not displayed.  Estimated Creatinine Clearance: 106.6 mL/min (by C-G formula based on Cr of 0.93).   Assessment: Heparin level is sub-therapeutic s/p re-start after cath, no issues per RN.   Goal of Therapy:  Heparin level 0.3-0.7 units/ml Monitor platelets by anticoagulation protocol: Yes   Plan:  -Increase heparin to 1650 units/hr -1300 HL -Daily CBC/HL -Monitor for bleeding  Narda Bonds 01/16/2015,5:47 AM

## 2015-01-16 NOTE — Progress Notes (Signed)
DAILY PROGRESS NOTE  Subjective:  No events overnight. Some shortness of breath, but he says he has this at baseline. Says he is "peeing a lot". Patient presented late with anterolateral STEMI - troponins were declining on presentation. 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 Despite the difficulty with PCI, troponin continues to decline post-intervention. Echo yesterday shows LVEF of 40-45% and akinesis of the apical and anteroseptal myocardium.  Objective:  Temp:  [97.7 F (36.5 C)-98.4 F (36.9 C)] 97.9 F (36.6 C) (12/08 0844) Pulse Rate:  [75-124] 88 (12/08 0844) Resp:  [10-30] 19 (12/08 0844) BP: (90-129)/(70-96) 106/86 mmHg (12/08 0844) SpO2:  [93 %-99 %] 94 % (12/08 0844) Weight:  [194 lb 0.1 oz (88 kg)] 194 lb 0.1 oz (88 kg) (12/08 0400) Weight change: -9 lb 15.9 oz (-4.534 kg)  Intake/Output from previous day: 12/07 0701 - 12/08 0700 In: 2851.8 [I.V.:2851.8] Out: 2625 [Urine:2625]  Intake/Output from this shift: Total I/O In: 33 [I.V.:33] Out: 700 [Urine:700]  Medications: Current Facility-Administered Medications  Medication Dose Route Frequency Provider Last Rate Last Dose  . 0.9 %  sodium chloride infusion  250 mL Intravenous PRN Troy Sine, MD      . 0.9 %  sodium chloride infusion   Intravenous Continuous Troy Sine, MD   Stopped at 01/16/15 [phone]  . acetaminophen (TYLENOL) tablet 650 mg  650 mg Oral Q4H PRN Troy Sine, MD   650 mg at 01/15/15 2255  . amLODipine (NORVASC) tablet 2.5 mg  2.5 mg Oral Daily Alphia Moh, MD   2.5 mg at 01/15/15 2151  . aspirin EC tablet 81 mg  81 mg Oral Daily Troy Sine, MD   81 mg at 01/15/15 1515  . atorvastatin (LIPITOR) tablet 80 mg  80 mg Oral q1800 Troy Sine, MD   80  mg at 01/15/15 1856  . heparin ADULT infusion 100 units/mL (25000 units/250 mL)  1,650 Units/hr Intravenous Continuous Erenest Blank, RPH 16.5 mL/hr at 01/16/15 0551 1,650 Units/hr at 01/16/15 0551  . Influenza vac split quadrivalent PF (FLUARIX) injection 0.5 mL  0.5 mL Intramuscular Tomorrow-1000 Satira Sark, MD      . losartan (COZAAR) tablet 50 mg  50 mg Oral Daily Almyra Deforest, [place]   50 mg at 01/15/15 2151  . metoprolol tartrate (LOPRESSOR) tablet 12.5 mg  12.5 mg Oral BID Almyra Deforest, PA   12.5 mg at 01/15/15 2151  . nitroGLYCERIN (NITROSTAT) SL tablet 0.4 mg  0.4 mg Sublingual Q5 Min x 3 PRN Almyra Deforest, PA      . nitroGLYCERIN 50 mg in dextrose 5 % 250 mL (0.2 mg/mL) infusion  0-200 mcg/min Intravenous Continuous Troy Sine, MD   Stopped at 01/16/15 [phone]  . ondansetron (ZOFRAN) injection 4 mg  4 mg Intravenous Q6H PRN Troy Sine, MD      . sodium chloride 0.9 % injection 3 mL  3 mL Intravenous Q12H Troy Sine, MD   3 mL at 01/15/15 1615  . sodium chloride 0.9 % injection 3 mL  3 mL Intravenous PRN Troy Sine, MD      . testosterone (ANDROGEL) 50 MG/5GM (1%) gel 5 g  5 g Transdermal Daily Almyra Deforest, [place]      .  ticagrelor (BRILINTA) tablet 90 mg  90 mg Oral BID Troy Sine, MD   90 mg at 01/15/15 2151    Physical Exam: General appearance: alert and no distress Neck: no carotid bruit and no JVD Lungs: clear to auscultation bilaterally Heart: regular rate and rhythm, S1, S2 normal, no murmur, click, rub or gallop Abdomen: soft, non-tender; bowel sounds normal; no masses,  no organomegaly Extremities: extremities normal, atraumatic, no cyanosis or edema Pulses: 2+ and symmetric Skin: Skin color, texture, turgor normal. No rashes or lesions Neurologic: Grossly normal Psych: Pleasant  Lab Results: Results for orders placed or performed during the hospital encounter of 01/14/15 (from the past 48 hour(s))  CBC with Differential     Status: Abnormal   Collection Time:  01/14/15  6:10 PM  Result Value Ref Range   WBC 11.7 (H) 4.0 - 10.5 K/uL   RBC 5.17 4.22 - 5.81 MIL/uL   Hemoglobin 15.4 13.0 - 17.0 g/dL   HCT 45.0 39.0 - 52.0 %   MCV 87.0 78.0 - 100.0 fL   MCH 29.8 26.0 - 34.0 pg   MCHC 34.2 30.0 - 36.0 g/dL   RDW 12.9 11.5 - 15.5 %   Platelets 176 150 - 400 K/uL   Neutrophils Relative % 67 %   Lymphocytes Relative 22 %   Monocytes Relative 10 %   Eosinophils Relative 1 %   Basophils Relative 0 %   Neutro Abs 7.8 (H) 1.7 - 7.7 K/uL   Lymphs Abs 2.6 0.7 - 4.0 K/uL   Monocytes Absolute 1.2 (H) 0.1 - 1.0 K/uL   Eosinophils Absolute 0.1 0.0 - 0.7 K/uL   Basophils Absolute 0.0 0.0 - 0.1 K/uL   Smear Review MORPHOLOGY UNREMARKABLE   Comprehensive metabolic panel     Status: Abnormal   Collection Time: 01/14/15  6:10 PM  Result Value Ref Range   Sodium 138 135 - 145 mmol/L   Potassium 4.1 3.5 - 5.1 mmol/L   Chloride 102 101 - 111 mmol/L   CO2 25 22 - 32 mmol/L   Glucose, Bld 97 65 - 99 mg/dL   BUN 15 6 - 20 mg/dL   Creatinine, Ser 0.93 0.61 - 1.24 mg/dL   Calcium 9.1 8.9 - 10.3 mg/dL   Total Protein 7.0 6.5 - 8.1 g/dL   Albumin 4.1 3.5 - 5.0 g/dL   AST 82 (H) 15 - 41 U/L   ALT 34 17 - 63 U/L   Alkaline Phosphatase 74 38 - 126 U/L   Total Bilirubin 1.6 (H) 0.3 - 1.2 mg/dL   GFR calc non Af Amer >60 >60 mL/min   GFR calc Af Amer >60 >60 mL/min    Comment: (NOTE) The eGFR has been calculated using the CKD EPI equation. This calculation has not been validated in all clinical situations. eGFR's persistently <60 mL/min signify possible Chronic Kidney Disease.    Anion gap 11 5 - 15  Troponin I     Status: Abnormal   Collection Time: 01/14/15  6:10 PM  Result Value Ref Range   Troponin I 13.90 (HH) <0.031 ng/mL    Comment:        POSSIBLE MYOCARDIAL ISCHEMIA. SERIAL TESTING RECOMMENDED. CRITICAL RESULT CALLED TO, READ BACK BY AND VERIFIED WITHRaina Mina RN [phone] 1857 GREEN R   MRSA PCR Screening     Status: None   Collection Time:  01/14/15 10:00 PM  Result Value Ref Range   MRSA by PCR NEGATIVE NEGATIVE  Comment:        The GeneXpert MRSA Assay (FDA approved for NASAL specimens only), is one component of a comprehensive MRSA colonization surveillance program. It is not intended to diagnose MRSA infection nor to guide or monitor treatment for MRSA infections.   Troponin I     Status: Abnormal   Collection Time: 01/14/15 10:53 PM  Result Value Ref Range   Troponin I 11.21 (HH) <0.031 ng/mL    Comment:        POSSIBLE MYOCARDIAL ISCHEMIA. SERIAL TESTING RECOMMENDED. CRITICAL VALUE NOTED.  VALUE IS CONSISTENT WITH PREVIOUSLY REPORTED AND CALLED VALUE.   Heparin level (unfractionated)     Status: Abnormal   Collection Time: 01/15/15  1:32 AM  Result Value Ref Range   Heparin Unfractionated <0.10 (L) 0.30 - 0.70 IU/mL    Comment:        IF HEPARIN RESULTS ARE BELOW EXPECTED VALUES, AND PATIENT DOSAGE HAS BEEN CONFIRMED, SUGGEST FOLLOW UP TESTING OF ANTITHROMBIN III LEVELS.   CBC     Status: None   Collection Time: 01/15/15  4:47 AM  Result Value Ref Range   WBC 9.1 4.0 - 10.5 K/uL   RBC 5.44 4.22 - 5.81 MIL/uL   Hemoglobin 16.3 13.0 - 17.0 g/dL   HCT 47.2 39.0 - 52.0 %   MCV 86.8 78.0 - 100.0 fL   MCH 30.0 26.0 - 34.0 pg   MCHC 34.5 30.0 - 36.0 g/dL   RDW 13.0 11.5 - 15.5 %   Platelets 210 150 - 400 K/uL  Hemoglobin A1c     Status: Abnormal   Collection Time: 01/15/15  4:47 AM  Result Value Ref Range   Hgb A1c MFr Bld 5.9 (H) 4.8 - 5.6 %    Comment: (NOTE)         Pre-diabetes: 5.7 - 6.4         Diabetes: >6.4         Glycemic control for adults with diabetes: <7.0    Mean Plasma Glucose 123 mg/dL    Comment: (NOTE) Performed At: [facility] [address] [place], [place] [id number] Lindon Romp MD BP:[id number]   Troponin I     Status: Abnormal   Collection Time: 01/15/15  4:47 AM  Result Value Ref Range   Troponin I 7.71 (HH) <0.031 ng/mL    Comment:          POSSIBLE MYOCARDIAL ISCHEMIA. SERIAL TESTING RECOMMENDED. CRITICAL VALUE NOTED.  VALUE IS CONSISTENT WITH PREVIOUSLY REPORTED AND CALLED VALUE.   Lipid panel     Status: Abnormal   Collection Time: 01/15/15  4:47 AM  Result Value Ref Range   Cholesterol 236 (H) 0 - 200 mg/dL   Triglycerides 114 <150 mg/dL   HDL 37 (L) >40 mg/dL   Total CHOL/HDL Ratio 6.4 RATIO   VLDL 23 0 - 40 mg/dL   LDL Cholesterol 176 (H) 0 - 99 mg/dL    Comment:        Total Cholesterol/HDL:CHD Risk Coronary Heart Disease Risk Table                     Men   Women  1/2 Average Risk   3.4   3.3  Average Risk       5.0   4.4  2 X Average Risk   9.6   7.1  3 X Average Risk  23.4   11.0        Use the calculated  Patient Ratio above and the CHD Risk Table to determine the patient's CHD Risk.        ATP III CLASSIFICATION (LDL):  <100     mg/dL   Optimal  100-129  mg/dL   Near or Above                    Optimal  130-159  mg/dL   Borderline  160-189  mg/dL   High  >190     mg/dL   Very High   Protime-INR     Status: None   Collection Time: 01/15/15  4:47 AM  Result Value Ref Range   Prothrombin Time 14.1 11.6 - 15.2 seconds   INR 1.07 0.00 - 1.49  POCT Activated clotting time     Status: None   Collection Time: 01/15/15  9:28 AM  Result Value Ref Range   Activated Clotting Time 703 seconds  Troponin I     Status: Abnormal   Collection Time: 01/15/15  1:48 PM  Result Value Ref Range   Troponin I 3.24 (HH) <0.031 ng/mL    Comment:        POSSIBLE MYOCARDIAL ISCHEMIA. SERIAL TESTING RECOMMENDED. CRITICAL VALUE NOTED.  VALUE IS CONSISTENT WITH PREVIOUSLY REPORTED AND CALLED VALUE.   Troponin I (serum)     Status: Abnormal   Collection Time: 01/15/15  3:45 PM  Result Value Ref Range   Troponin I 3.20 (HH) <0.031 ng/mL    Comment:        POSSIBLE MYOCARDIAL ISCHEMIA. SERIAL TESTING RECOMMENDED. CRITICAL VALUE NOTED.  VALUE IS CONSISTENT WITH PREVIOUSLY REPORTED AND CALLED VALUE.   Platelet  count     Status: None   Collection Time: 01/15/15  6:27 PM  Result Value Ref Range   Platelets 210 150 - 400 K/uL  CBC     Status: Abnormal   Collection Time: 01/16/15  5:05 AM  Result Value Ref Range   WBC 15.8 (H) 4.0 - 10.5 K/uL   RBC 4.35 4.22 - 5.81 MIL/uL   Hemoglobin 12.6 (L) 13.0 - 17.0 g/dL    Comment: DELTA CHECK NOTED REPEATED TO VERIFY    HCT 38.3 (L) 39.0 - 52.0 %   MCV 88.0 78.0 - 100.0 fL   MCH 29.0 26.0 - 34.0 pg   MCHC 32.9 30.0 - 36.0 g/dL   RDW 13.1 11.5 - 15.5 %   Platelets 188 150 - 400 K/uL  Basic metabolic panel     Status: Abnormal   Collection Time: 01/16/15  5:05 AM  Result Value Ref Range   Sodium 137 135 - 145 mmol/L   Potassium 3.9 3.5 - 5.1 mmol/L   Chloride 106 101 - 111 mmol/L   CO2 23 22 - 32 mmol/L   Glucose, Bld 124 (H) 65 - 99 mg/dL   BUN 10 6 - 20 mg/dL   Creatinine, Ser 0.74 0.61 - 1.24 mg/dL   Calcium 8.3 (L) 8.9 - 10.3 mg/dL   GFR calc non Af Amer >60 >60 mL/min   GFR calc Af Amer >60 >60 mL/min    Comment: (NOTE) The eGFR has been calculated using the CKD EPI equation. This calculation has not been validated in all clinical situations. eGFR's persistently <60 mL/min signify possible Chronic Kidney Disease.    Anion gap 8 5 - 15  Heparin level (unfractionated)     Status: Abnormal   Collection Time: 01/16/15  5:05 AM  Result Value Ref Range   Heparin Unfractionated 0.25 (  L) 0.30 - 0.70 IU/mL    Comment:        IF HEPARIN RESULTS ARE BELOW EXPECTED VALUES, AND PATIENT DOSAGE HAS BEEN CONFIRMED, SUGGEST FOLLOW UP TESTING OF ANTITHROMBIN III LEVELS.     Imaging: Dg Chest Port 1 View  01/14/2015  CLINICAL DATA:  Patient woke up yesterday with chest pain, cardiac palpitations, and dizziness. EXAM: PORTABLE CHEST 1 VIEW COMPARISON:  01/22/2014 FINDINGS: The heart size and mediastinal contours are within normal limits. Both lungs are clear. The visualized skeletal structures are unremarkable. IMPRESSION: No active disease.  Electronically Signed   By: Van Clines M.D.   On: 01/14/2015 19:03    Assessment:  Principal Problem:   ST elevation myocardial infarction (STEMI) of anterolateral wall (HCC) -- Delayed presentation, pain free on admission Active Problems:   Dyslipidemia   Hypertensive heart disease   Acute MI anterior wall first episode care [facility]) -   Acute coronary syndrome (HCC)   Acute systolic congestive heart failure, NYHA class 2 ([place])   Plan:  1. Acute anterior STEMI - troponin trending down. No further chest pain. He has infarcted the distal anteroseptal and apical walls. Now on ASA and Brillinta (DAPT) indefinitely. ST elevation persists, but is improved. 2. Acute systolic congestive heart failure - LVEF 40-45%. He is dyspneic, but lungs are clear and JVP not elevated. Will decrease fluid intake - seems to be autodiuresing. Check BNP in am tomorrow - may need additional diuresis. 3. Dyslipidemia - LDL 176. Now on atorvastatin 80 mg QHS. May need additional treatment post-discharge. 4. Hypertension - BP is low normal at this point. Borderline tachycardic. D/c amlodipine. Titrate metoprolol and losartan as tolerated.  Ambulated with cardiac rehab. Ok to transfer to stepdown.  Time Spent Directly with Patient:  15 minutes  Length of Stay:  LOS: 2 days   Pixie Casino, MD, [facility] Attending Cardiologist [place] 01/16/2015, 9:44 AM

## 2015-01-17 LAB — CBC
HCT: 42.4 % (ref 39.0–52.0)
HEMOGLOBIN: 14.5 g/dL (ref 13.0–17.0)
MCH: 29.5 pg (ref 26.0–34.0)
MCHC: 34.2 g/dL (ref 30.0–36.0)
MCV: 86.4 fL (ref 78.0–100.0)
Platelets: 202 10*3/uL (ref 150–400)
RBC: 4.91 MIL/uL (ref 4.22–5.81)
RDW: 13.1 % (ref 11.5–15.5)
WBC: 15 10*3/uL — ABNORMAL HIGH (ref 4.0–10.5)

## 2015-01-17 LAB — CBC WITH DIFFERENTIAL/PLATELET
BASOS PCT: 0 %
Basophils Absolute: 0 10*3/uL (ref 0.0–0.1)
EOS ABS: 0 10*3/uL (ref 0.0–0.7)
EOS PCT: 0 %
HCT: 42.9 % (ref 39.0–52.0)
Hemoglobin: 14.6 g/dL (ref 13.0–17.0)
LYMPHS ABS: 1.6 10*3/uL (ref 0.7–4.0)
Lymphocytes Relative: 11 %
MCH: 29.7 pg (ref 26.0–34.0)
MCHC: 34 g/dL (ref 30.0–36.0)
MCV: 87.2 fL (ref 78.0–100.0)
MONOS PCT: 18 %
Monocytes Absolute: 2.6 10*3/uL — ABNORMAL HIGH (ref 0.1–1.0)
NEUTROS PCT: 71 %
Neutro Abs: 9.9 10*3/uL — ABNORMAL HIGH (ref 1.7–7.7)
PLATELETS: 208 10*3/uL (ref 150–400)
RBC: 4.92 MIL/uL (ref 4.22–5.81)
RDW: 13.1 % (ref 11.5–15.5)
WBC: 14.1 10*3/uL — AB (ref 4.0–10.5)

## 2015-01-17 LAB — BASIC METABOLIC PANEL
Anion gap: 9 (ref 5–15)
BUN: 14 mg/dL (ref 6–20)
CO2: 25 mmol/L (ref 22–32)
CREATININE: 0.82 mg/dL (ref 0.61–1.24)
Calcium: 8.4 mg/dL — ABNORMAL LOW (ref 8.9–10.3)
Chloride: 100 mmol/L — ABNORMAL LOW (ref 101–111)
GFR calc Af Amer: >60 mL/min (ref >60)
GLUCOSE: 107 mg/dL — AB (ref 65–99)
POTASSIUM: 3.5 mmol/L (ref 3.5–5.1)
SODIUM: 134 mmol/L — AB (ref 135–145)

## 2015-01-17 LAB — ABO/RH: ABO/RH(D): B POS

## 2015-01-17 LAB — BRAIN NATRIURETIC PEPTIDE: B NATRIURETIC PEPTIDE 5: 358.1 pg/mL — AB (ref 0.0–100.0)

## 2015-01-17 LAB — TYPE AND SCREEN
ABO/RH(D): B POS
ANTIBODY SCREEN: NEGATIVE

## 2015-01-17 MED ORDER — METOPROLOL TARTRATE 25 MG PO TABS
25.0000 mg | ORAL_TABLET | Freq: Two times a day (BID) | ORAL | Status: DC
  Administered 2015-01-17 (×2): 25 mg via ORAL
  Filled 2015-01-17 (×2): qty 1

## 2015-01-17 MED ORDER — ONDANSETRON HCL 4 MG/2ML IJ SOLN
4.0000 mg | Freq: Once | INTRAMUSCULAR | Status: AC
  Administered 2015-01-17: 4 mg via INTRAVENOUS

## 2015-01-17 MED ORDER — ONDANSETRON HCL 4 MG/2ML IJ SOLN
INTRAMUSCULAR | Status: AC
  Administered 2015-01-17: 12:00:00
  Filled 2015-01-17: qty 2

## 2015-01-17 MED ORDER — LOSARTAN POTASSIUM 25 MG PO TABS
25.0000 mg | ORAL_TABLET | Freq: Every day | ORAL | Status: DC
  Administered 2015-01-17: 25 mg via ORAL
  Filled 2015-01-17: qty 1

## 2015-01-17 MED ORDER — FUROSEMIDE 20 MG PO TABS
20.0000 mg | ORAL_TABLET | Freq: Every day | ORAL | Status: DC
  Administered 2015-01-17: 20 mg via ORAL
  Filled 2015-01-17: qty 1

## 2015-01-17 NOTE — Progress Notes (Signed)
CARDIAC REHAB PHASE I   PRE:  Rate/Rhythm: 97 SR, 110s-120 with ambulation  BP:  Sitting: 124/79        SaO2: 95 RA  MODE:  Ambulation: 175 ft   POST:  Rate/Rhythm: 86 SR  BP:  Lying: 94/75         SaO2: 97 RA  Pt lying in bed, states he feels tired today.  Agreeable to ambulate. Pt ambulated 175 ft on RA, handheld assist, slow, steady gait. Pt stopped to speak with MD in hallway, c/o of dizziness. Sat in chair, returned to room in chair with assistance of Dr. Haroldine Laws. Upon entering room, pt became unresponsive, called for assistance, staff returned pt to bed. BP 94/75, HR 80s, oxygen 95-97% RA. Pt spontaneously returned to consciousness, alert and oriented, RN notified. Pt in bed, call bell within reach. Will follow.   [phone] Lenna Sciara, RN, BSN 01/17/2015 12:07 PM

## 2015-01-17 NOTE — Progress Notes (Signed)
Notified by nursing staff of brief LOC with ambulation. HR decreased, but not clear vagal response. Was given additional lasix today. Please check orthostatics. Will likely need IVF's. Hold on discharge today until this issue is resolved.  Pixie Casino, MD, [facility] Attending Cardiologist [place]

## 2015-01-17 NOTE — Progress Notes (Signed)
DAILY PROGRESS NOTE  Subjective:  No events overnight. Tachycardic - dyspnea has improved. Autodiuresed 1.5L yesterday. BNP elevated around 300. Moderately reduced LV function by echo at 40-45%.  Objective:  Temp:  [97.5 F (36.4 C)-98.6 F (37 C)] 98.3 F (36.8 C) (12/09 0748) Pulse Rate:  [93-111] 100 (12/09 0800) Resp:  [15-28] 19 (12/09 0800) BP: (107-126)/(72-90) 109/84 mmHg (12/09 0800) SpO2:  [91 %-98 %] 94 % (12/09 0800) Weight change:   Intake/Output from previous day: 12/08 0701 - 12/09 0700 In: 753 [P.O.:720; I.V.:33] Out: 2475 [Urine:2475]  Intake/Output from this shift:    Medications: Current Facility-Administered Medications  Medication Dose Route Frequency Provider Last Rate Last Dose  . 0.9 %  sodium chloride infusion  250 mL Intravenous PRN Troy Sine, MD      . acetaminophen (TYLENOL) tablet 650 mg  650 mg Oral Q4H PRN Troy Sine, MD   650 mg at 01/17/15 0554  . aspirin EC tablet 81 mg  81 mg Oral Daily Troy Sine, MD   81 mg at 01/16/15 1130  . atorvastatin (LIPITOR) tablet 80 mg  80 mg Oral q1800 Troy Sine, MD   80 mg at 01/16/15 1735  . Influenza vac split quadrivalent PF (FLUARIX) injection 0.5 mL  0.5 mL Intramuscular Tomorrow-1000 Satira Sark, MD      . losartan (COZAAR) tablet 50 mg  50 mg Oral Daily Almyra Deforest, [place]   50 mg at 01/16/15 2110  . metoprolol tartrate (LOPRESSOR) tablet 12.5 mg  12.5 mg Oral BID Almyra Deforest, PA   12.5 mg at 01/16/15 2110  . nitroGLYCERIN (NITROSTAT) SL tablet 0.4 mg  0.4 mg Sublingual Q5 Min x 3 PRN Almyra Deforest, PA      . nitroGLYCERIN 50 mg in dextrose 5 % 250 mL (0.2 mg/mL) infusion  0-200 mcg/min Intravenous Continuous Troy Sine, MD   Stopped at 01/16/15 [phone]  . ondansetron (ZOFRAN) injection 4 mg  4 mg Intravenous Q6H PRN Troy Sine, MD      . pantoprazole (PROTONIX) EC tablet 40 mg  40 mg Oral Daily Rhonda G Barrett, PA-C   40 mg at 01/16/15 1825  . sodium chloride 0.9 % injection 3 mL  3  mL Intravenous Q12H Troy Sine, MD   3 mL at 01/16/15 2114  . sodium chloride 0.9 % injection 3 mL  3 mL Intravenous PRN Troy Sine, MD      . testosterone (ANDROGEL) 50 MG/5GM (1%) gel 10 g  10 g Transdermal Daily Lelon Perla, MD   5 g at 01/16/15 1210  . ticagrelor (BRILINTA) tablet 90 mg  90 mg Oral BID Troy Sine, MD   90 mg at 01/16/15 2110    Physical Exam: General appearance: alert and no distress Neck: no carotid bruit and no JVD Lungs: clear to auscultation bilaterally Heart: regular rate and rhythm, S1, S2 normal, no murmur, click, rub or gallop Abdomen: soft, non-tender; bowel sounds normal; no masses,  no organomegaly Extremities: extremities normal, atraumatic, no cyanosis or edema Pulses: 2+ and symmetric Skin: Skin color, texture, turgor normal. No rashes or lesions Neurologic: Grossly normal Psych: Pleasant  Lab Results: Results for orders placed or performed during the hospital encounter of 01/14/15 (from the past 48 hour(s))  Troponin I     Status: Abnormal   Collection Time: 01/15/15  1:48 PM  Result Value Ref Range   Troponin I 3.24 (HH) <0.031 ng/mL  Comment:        POSSIBLE MYOCARDIAL ISCHEMIA. SERIAL TESTING RECOMMENDED. CRITICAL VALUE NOTED.  VALUE IS CONSISTENT WITH PREVIOUSLY REPORTED AND CALLED VALUE.   Troponin I (serum)     Status: Abnormal   Collection Time: 01/15/15  3:45 PM  Result Value Ref Range   Troponin I 3.20 (HH) <0.031 ng/mL    Comment:        POSSIBLE MYOCARDIAL ISCHEMIA. SERIAL TESTING RECOMMENDED. CRITICAL VALUE NOTED.  VALUE IS CONSISTENT WITH PREVIOUSLY REPORTED AND CALLED VALUE.   Platelet count     Status: None   Collection Time: 01/15/15  6:27 PM  Result Value Ref Range   Platelets 210 150 - 400 K/uL  CBC     Status: Abnormal   Collection Time: 01/16/15  5:05 AM  Result Value Ref Range   WBC 15.8 (H) 4.0 - 10.5 K/uL   RBC 4.35 4.22 - 5.81 MIL/uL   Hemoglobin 12.6 (L) 13.0 - 17.0 g/dL    Comment: DELTA  CHECK NOTED REPEATED TO VERIFY    HCT 38.3 (L) 39.0 - 52.0 %   MCV 88.0 78.0 - 100.0 fL   MCH 29.0 26.0 - 34.0 pg   MCHC 32.9 30.0 - 36.0 g/dL   RDW 13.1 11.5 - 15.5 %   Platelets 188 150 - 400 K/uL  Basic metabolic panel     Status: Abnormal   Collection Time: 01/16/15  5:05 AM  Result Value Ref Range   Sodium 137 135 - 145 mmol/L   Potassium 3.9 3.5 - 5.1 mmol/L   Chloride 106 101 - 111 mmol/L   CO2 23 22 - 32 mmol/L   Glucose, Bld 124 (H) 65 - 99 mg/dL   BUN 10 6 - 20 mg/dL   Creatinine, Ser 0.74 0.61 - 1.24 mg/dL   Calcium 8.3 (L) 8.9 - 10.3 mg/dL   GFR calc non Af Amer >60 >60 mL/min   GFR calc Af Amer >60 >60 mL/min    Comment: (NOTE) The eGFR has been calculated using the CKD EPI equation. This calculation has not been validated in all clinical situations. eGFR's persistently <60 mL/min signify possible Chronic Kidney Disease.    Anion gap 8 5 - 15  Heparin level (unfractionated)     Status: Abnormal   Collection Time: 01/16/15  5:05 AM  Result Value Ref Range   Heparin Unfractionated 0.25 (L) 0.30 - 0.70 IU/mL    Comment:        IF HEPARIN RESULTS ARE BELOW EXPECTED VALUES, AND PATIENT DOSAGE HAS BEEN CONFIRMED, SUGGEST FOLLOW UP TESTING OF ANTITHROMBIN III LEVELS.   Basic metabolic panel     Status: Abnormal   Collection Time: 01/17/15  2:51 AM  Result Value Ref Range   Sodium 134 (L) 135 - 145 mmol/L   Potassium 3.5 3.5 - 5.1 mmol/L   Chloride 100 (L) 101 - 111 mmol/L   CO2 25 22 - 32 mmol/L   Glucose, Bld 107 (H) 65 - 99 mg/dL   BUN 14 6 - 20 mg/dL   Creatinine, Ser 0.82 0.61 - 1.24 mg/dL   Calcium 8.4 (L) 8.9 - 10.3 mg/dL   GFR calc non Af Amer >60 >60 mL/min   GFR calc Af Amer >60 >60 mL/min    Comment: (NOTE) The eGFR has been calculated using the CKD EPI equation. This calculation has not been validated in all clinical situations. eGFR's persistently <60 mL/min signify possible Chronic Kidney Disease.    Anion gap 9 5 -  15  Brain natriuretic  peptide     Status: Abnormal   Collection Time: 01/17/15  2:51 AM  Result Value Ref Range   B Natriuretic Peptide 358.1 (H) 0.0 - 100.0 pg/mL  CBC     Status: Abnormal   Collection Time: 01/17/15  2:51 AM  Result Value Ref Range   WBC 15.0 (H) 4.0 - 10.5 K/uL   RBC 4.91 4.22 - 5.81 MIL/uL   Hemoglobin 14.5 13.0 - 17.0 g/dL   HCT 42.4 39.0 - 52.0 %   MCV 86.4 78.0 - 100.0 fL   MCH 29.5 26.0 - 34.0 pg   MCHC 34.2 30.0 - 36.0 g/dL   RDW 13.1 11.5 - 15.5 %   Platelets 202 150 - 400 K/uL    Imaging: No results found.  Assessment:  Principal Problem:   ST elevation myocardial infarction (STEMI) of anterolateral wall (HCC) -- Delayed presentation, pain free on admission Active Problems:   Dyslipidemia   Hypertensive heart disease   Acute MI anterior wall first episode care [facility]) -   Acute coronary syndrome (HCC)   Acute systolic congestive heart failure, NYHA class 2 ([place])   Plan:  1. Acute anterior STEMI - troponin trending down. No further chest pain. He has infarcted the distal anteroseptal and apical walls. Now on ASA and Brillinta (DAPT) indefinitely. EF 40-45%. 2. Acute systolic congestive heart failure - LVEF 40-45%. Breathing has improved. Auto-diuresed about 1.5L. Given reduced LV function, add low dose lasix 20 mg daily - BNP today around 300. 3. Dyslipidemia - LDL 176. Now on atorvastatin 80 mg QHS. May need additional treatment post-discharge. 4. Hypertension - BP is low normal at this point. Borderline tachycardic. Amlodipine stopped. Increase metoprolol to 25 mg BID and decreased losartan to 25 mg daily to allow bp room. 5. Tachycardia - remains in low 100's. Unclear if related to CHF or anxiety? Afebrile.  Ambulated with cardiac rehab again today. Can likely be discharged later this afternoon if HR improved.   Time Spent Directly with Patient:  15 minutes  Length of Stay:  LOS: 3 days   Pixie Casino, MD, [facility] Attending Cardiologist [place] 01/17/2015, 9:54 AM

## 2015-01-17 NOTE — Progress Notes (Signed)
   Stopped by to check on Cesar Woods following his event earlier today. Orthostatic vitals were checked at that time and negative. He reports feeling well at this time. Denies any chest pain, palpitations, shortness of breath, or dyspnea.   Wishes to follow-up with [name] (Performed his catheterization). Anticipate discharge in the AM.  Signed, Erma Heritage, PA-C 01/17/2015, 5:27 PM Pager: [phone]

## 2015-01-18 DIAGNOSIS — I951 Orthostatic hypotension: Secondary | ICD-10-CM

## 2015-01-18 LAB — BASIC METABOLIC PANEL
Anion gap: 10 (ref 5–15)
BUN: 18 mg/dL (ref 6–20)
CHLORIDE: 100 mmol/L — AB (ref 101–111)
CO2: 25 mmol/L (ref 22–32)
CREATININE: 0.93 mg/dL (ref 0.61–1.24)
Calcium: 8.6 mg/dL — ABNORMAL LOW (ref 8.9–10.3)
GFR calc non Af Amer: >60 mL/min (ref >60)
Glucose, Bld: 104 mg/dL — ABNORMAL HIGH (ref 65–99)
POTASSIUM: 3.9 mmol/L (ref 3.5–5.1)
SODIUM: 135 mmol/L (ref 135–145)

## 2015-01-18 MED ORDER — NITROGLYCERIN 0.4 MG SL SUBL: 0.4000 mg | SUBLINGUAL_TABLET | SUBLINGUAL | Status: DC | PRN

## 2015-01-18 MED ORDER — TICAGRELOR 90 MG PO TABS: 90.0000 mg | ORAL_TABLET | Freq: Two times a day (BID) | ORAL | Status: DC

## 2015-01-18 MED ORDER — METOPROLOL SUCCINATE ER 25 MG PO TB24: 12.5000 mg | ORAL_TABLET | Freq: Every day | ORAL | Status: DC

## 2015-01-18 MED ORDER — ATORVASTATIN CALCIUM 80 MG PO TABS: 80.0000 mg | ORAL_TABLET | Freq: Every day | ORAL | Status: DC

## 2015-01-18 MED ORDER — ASPIRIN 81 MG PO TBEC: 81.0000 mg | DELAYED_RELEASE_TABLET | Freq: Every day | ORAL | Status: DC

## 2015-01-18 MED ORDER — LOSARTAN POTASSIUM 25 MG PO TABS: 12.5000 mg | ORAL_TABLET | Freq: Every day | ORAL | Status: DC

## 2015-01-18 MED ORDER — METOPROLOL SUCCINATE ER 25 MG PO TB24
12.5000 mg | ORAL_TABLET | Freq: Every day | ORAL | Status: DC
  Administered 2015-01-18: 12.5 mg via ORAL
  Filled 2015-01-18: qty 1

## 2015-01-18 NOTE — Discharge Summary (Signed)
Physician Discharge Summary   Cardiologist:  Claiborne Billings  Patient ID: Cesar Woods MRN: [id number] DOB/AGE: [birth year] 49 y.o.  Admit date: 01/14/2015 Discharge date: 01/18/2015  Admission Diagnoses:  STEMI  Discharge Diagnoses:  Principal Problem:   ST elevation myocardial infarction (STEMI) of anterolateral wall ([place]) -- Delayed presentation, pain free on admission Active Problems:   Dyslipidemia   Hypertensive heart disease   Acute MI anterior wall first episode care [facility]) -   Acute coronary syndrome ([place])   Acute systolic congestive heart failure, NYHA class 2 ([place])   Discharged Condition: stable  Hospital Course:   Cesar Woods is a 49 y.o.male with past medical history of hyperlipidemia, essential hypertension, GERD, testicular cancer, migraines and 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  He was referred to the ER by [name] due to abnormal ECG and recent worsening indigestion. He reports a long-standing history of reflux, has used Prilosec and Tums for years, but had a more significant episode of substernal chest discomfort yesterday morning when he woke up. He thought that it was possibly indigestion, but he did state that it was more intense and also associated with discomfort in his arms. He contacted his primary care provider, took additional Tums, and states that eventually his symptoms went away after several hours. He had been encouraged to seek medical attention if his symptoms did not improve, also reiterated by his wife, but he states that he felt better later in the day with the exception of a vague feeling of palpitations. This morning he got up and went to work as usual, had not experienced any significant indigestion or chest discomfort whatsoever, but still felt intermittent palpitations. He went in this afternoon to see [name] at which time an ECG was obtained consistent with recent anterolateral STEMI. He was sent to the ER via EMS.  On evaluation in the ER, he was  hemodynamically stable without any chest discomfort at all, no shortness of breath at rest, no palpitations.  He was mildly hypertensive, heart rate in the 90s. Troponin I level obtained an abnormal at 13.9, WBC 11.7. Chest x-ray shows no acute findings.  Patient works in [employer], [employer], fairly stressful job. He did not report any recent exertional chest discomfort, only recurring indigestion symptoms that have typically resolved with anti-reflux measures. He reported some shortness of breath with activity over the last few years, but no escalating pattern. He had a previous cardiac workup with Dr. Stanford Breed back in 2010 at which time Myoview study was low risk as outlined below. Patient's father had heart disease and underwent CABG at age 44.  He has a history of bilateral testicular cancer, underwent orchiectomy years ago and radiation, has been cancer free for 13 years by report. He has been on testosterone replacement. Also reported history of mild hyperlipemia, but not on statin therapy.  Patient went emergently to the Cath Lab and left heart catheterization revealed 99% mid LAD lesion, 95 and 70% distal LAD lesions, 40% ramus lesion and a 30% mid RCA.  He underwent a very difficult but successful percutaneous coronary intervention to the LAD system. Was calm K Karrie Doffing spiral dissection into the mid distal LAD. He ultimately had insertion of 3 tandem Synergy DES.   There was total occlusion of the diagonal vessel antegrade but evidence for development of retrograde collaterals to this diagonal vessel from the distal LAD.  Patient was started on dual antiplatelet therapy with aspirin and Brilinta.  Lipitor 80 mg was also started. LDL 176. He also had  a 2-D echocardiogram which revealed an ejection fraction of 40-45% with akinesis of the apical anterior septal and apical myocardium. Grade 1 diastolic dysfunction and mild MR.  Chest pain resolved.  Amlodipine was discontinued as his blood pressure was low and he was  somewhat tachycardic.  He is also on metoprolol which was later changed to Toprol 12.5 daily. Losartan was decreased to 25 mg daily and then later changed to 12.5.. He did well ambulating with cardiac rehabilitation. Patient stated he was eager to do phase II.  He was volume overloaded and auto diuresed 1.5 L. Was placed on 20 mg of Lasix daily which was later discontinued.  On December 9 patient had a brief loss of consciousness during ambulation. Heart rate decreased.  May have been a vagal response. Discharge was held.  Orthostatic vital signs were negative.  The patient was seen by Dr. Aundra Dubin who felt he was stable for DC home.      Consults: Cardiac rehabilitation  Significant Diagnostic Studies: Echocardiogram Study Conclusions  - Left ventricle: The cavity size was normal. There was mild focal basal hypertrophy of the septum. Systolic function was mildly to moderately reduced. The estimated ejection fraction was in the range of 40% to 45%. There is akinesis of the apicalanteroseptal and apical myocardium. Doppler parameters are consistent with abnormal left ventricular relaxation (grade 1 diastolic dysfunction). - Mitral valve: There was mild regurgitation.  Left heart catheterization Conclusion     Ramus lesion, 40% stenosed.  Mid RCA lesion, 30% stenosed.  Prox LAD lesion, 90% stenosed. Post intervention, there is a 0% residual stenosis.  Mid LAD lesion, 99% stenosed.  Dist LAD-1 lesion, 95% stenosed.  Dist LAD-2 lesion, 70% stenosed. Post intervention, there is a 15% residual stenosis.  There is mild left ventricular systolic dysfunction.  Out of hospital anterolateral infarction with late presentation secondary to multiple high-grade stenoses in the LAD.  Mild acute LV dysfunction with mid anterolateral hypokinesis and an ejection fraction of 40-45%.  Significant obstructive disease with 90% ostial LAD stenosis followed by 99% proximal LAD stenosis in  the region of the first septal perforating artery with significant thrombus, diffuse 50% stenosis followed by 95% ulcerated plaque in the proximal to mid LAD just prior to a diagonal vessel and initial apical LAD dissection; 40% OM1 (ramus immediate like) stenosis; and large, dominant RCA with 30% mid stenosis, and large PDA and PLA vessels with septal collateralization to the proximal LAD.  Very difficult but successful percutaneous coronary intervention to diffusely diseased LAD system, complicated by spiral dissection into the mid distal LAD with ultimate insertion of 3 tandem Synergy DES stents (2.538, 3.038, and 3.020 mm) extending from the mid LAD to the ostium and PTCA to the distal LAD with low-level inflation to tack up region of dissection with ultimate improvement in transient no reflow phenomena and TIMI 2-3 flow distally; total occlusion of the diagonal vessel antegrade but evidence for development of retrograde collaterals to this diagonal vessel from the distal LAD.  Recommendation: The patient should be maintained on dual antiplatelet therapy indefinitely. Upon leaving the catheterization laboratory. He had stable hemodynamics. His ECG did show more pronounced anterolateral ST elevation due to his diagonal occlusion. His chest pain was significantly improved and he left the catheterization laboratory with stable hemodynamics.   Diagnostic Diagram           Post-Intervention Diagram            Treatments:  See above  Discharge Exam: Blood pressure  116/80, pulse 98, temperature 98.6 F (37 C), temperature source Oral, resp. rate 20, height 6' (1.829 m), weight 194 lb 0.1 oz (88 kg), SpO2 96 %.   Disposition: 01-Home or Self Care      Discharge Instructions    AMB Referral to Cardiac Rehabilitation - Phase II    Complete by:  As directed   Diagnosis:  Myocardial Infarction     Diet - low sodium heart healthy    Complete by:  As directed      Discharge  instructions    Complete by:  As directed   Monitor your weight every morning.  If you gain 3 pounds in 24 hours, or 5 pounds in a week, call the office for instructions.     Increase activity slowly    Complete by:  As directed             Medication List    STOP taking these medications        amLODipine 2.5 MG tablet  Commonly known as:  NORVASC      TAKE these medications        aspirin 81 MG EC tablet  Take 1 tablet (81 mg total) by mouth daily.     atorvastatin 80 MG tablet  Commonly known as:  LIPITOR  Take 1 tablet (80 mg total) by mouth daily at 6 PM.     EPIPEN 2-PAK 0.3 mg/0.3 mL Soaj injection  Generic drug:  EPINEPHrine  INJECT 0.3 MLS INTO THE MUSCLE ONCE **PATIENT NEEDS TO SCHEDULE AN OFFICE VISIT**     losartan 25 MG tablet  Commonly known as:  COZAAR  Take 0.5 tablets (12.5 mg total) by mouth daily.     metoprolol succinate 25 MG 24 hr tablet  Commonly known as:  TOPROL-XL  Take 0.5 tablets (12.5 mg total) by mouth daily.     nitroGLYCERIN 0.4 MG SL tablet  Commonly known as:  NITROSTAT  Place 1 tablet (0.4 mg total) under the tongue every 5 (five) minutes x 3 doses as needed for chest pain.     omeprazole 20 MG capsule  Commonly known as:  PRILOSEC  Take 20 mg by mouth daily.     Testosterone 40.5 MG/2.5GM (1.62%) Gel  Commonly known as:  ANDROGEL  Place 5 Units onto the skin daily.     ticagrelor 90 MG Tabs tablet  Commonly known as:  BRILINTA  Take 1 tablet (90 mg total) by mouth 2 (two) times daily.       Follow-up Information    Follow up with Celina Shiley, PA-C On 01/24/2015.   Specialties:  Physician Assistant, Radiology, Interventional Cardiology   Why:  [place] on 01/24/2015 at 10:00AM.   Contact information:   [address] [place] [address] [phone]      Greater than 30 minutes was spent completing the patient's discharge.   SignedTarri Fuller, [place] 01/18/2015, 11:34 AM

## 2015-01-18 NOTE — Progress Notes (Signed)
Pt felt lightheaded while up in the bedside commode hr in the 90s,denied any CP or SOB-placed back in the bed BP stable -12 lead ekg done.

## 2015-01-18 NOTE — Progress Notes (Signed)
Patient ID: Cesar Woods, male   DOB: 1965-05-01, 49 y.o.   MRN: [id number]   SUBJECTIVE: Feels fine right now, no chest pain.  Had orthostatic symptoms yesterday => but was not orthostatic when formally checked.  Early this morning, had mild lightheadedness going to the bathroom.   Scheduled Meds: . aspirin EC  81 mg Oral Daily  . atorvastatin  80 mg Oral q1800  . Influenza vac split quadrivalent PF  0.5 mL Intramuscular Tomorrow-1000  . losartan  12.5 mg Oral Daily  . metoprolol succinate  12.5 mg Oral Daily  . pantoprazole  40 mg Oral Daily  . sodium chloride  3 mL Intravenous Q12H  . testosterone  10 g Transdermal Daily  . ticagrelor  90 mg Oral BID   Continuous Infusions: . nitroGLYCERIN Stopped (01/16/15 0428)   PRN Meds:.sodium chloride, acetaminophen, nitroGLYCERIN, ondansetron (ZOFRAN) IV, sodium chloride    Filed Vitals:   01/18/15 0600 01/18/15 0638 01/18/15 0700 01/18/15 0704  BP: 108/92 112/81  114/77  Pulse: 99  92 95  Temp:      TempSrc:      Resp: 24 28 20 22   Height:      Weight:      SpO2: 99%  95% 94%    Intake/Output Summary (Last 24 hours) at 01/18/15 0758 Last data filed at 01/18/15 0500  Gross per 24 hour  Intake    520 ml  Output   1000 ml  Net   -480 ml    LABS: Basic Metabolic Panel:  Recent Labs  01/17/15 0251 01/18/15 0420  NA 134* 135  K 3.5 3.9  CL 100* 100*  CO2 25 25  GLUCOSE 107* 104*  BUN 14 18  CREATININE 0.82 0.93  CALCIUM 8.4* 8.6*   Liver Function Tests: No results for input(s): AST, ALT, ALKPHOS, BILITOT, PROT, ALBUMIN in the last 72 hours. No results for input(s): LIPASE, AMYLASE in the last 72 hours. CBC:  Recent Labs  01/17/15 0251 01/17/15 1214  WBC 15.0* 14.1*  NEUTROABS  --  9.9*  HGB 14.5 14.6  HCT 42.4 42.9  MCV 86.4 87.2  PLT 202 208   Cardiac Enzymes:  Recent Labs  01/15/15 1348 01/15/15 1545  TROPONINI 3.24* 3.20*   BNP: Invalid input(s): POCBNP D-Dimer: No results for input(s):  DDIMER in the last 72 hours. Hemoglobin A1C: No results for input(s): HGBA1C in the last 72 hours. Fasting Lipid Panel: No results for input(s): CHOL, HDL, LDLCALC, TRIG, CHOLHDL, LDLDIRECT in the last 72 hours. Thyroid Function Tests: No results for input(s): TSH, T4TOTAL, T3FREE, THYROIDAB in the last 72 hours.  Invalid input(s): FREET3 Anemia Panel: No results for input(s): VITAMINB12, FOLATE, FERRITIN, TIBC, IRON, RETICCTPCT in the last 72 hours.  RADIOLOGY: Dg Chest Port 1 View  01/14/2015  CLINICAL DATA:  Patient woke up yesterday with chest pain, cardiac palpitations, and dizziness. EXAM: PORTABLE CHEST 1 VIEW COMPARISON:  01/22/2014 FINDINGS: The heart size and mediastinal contours are within normal limits. Both lungs are clear. The visualized skeletal structures are unremarkable. IMPRESSION: No active disease. Electronically Signed   By: Van Clines M.D.   On: 01/14/2015 19:03    PHYSICAL EXAM General: NAD Neck: No JVD, no thyromegaly or thyroid nodule.  Lungs: Clear to auscultation bilaterally with normal respiratory effort. CV: Nondisplaced PMI.  Heart regular S1/S2, no S3/S4, no murmur.  No peripheral edema.   Abdomen: Soft, nontender, no hepatosplenomegaly, no distention.  Neurologic: Alert and oriented x 3.  Psych:  Normal affect. Extremities: No clubbing or cyanosis.   TELEMETRY: Reviewed telemetry pt in NSR  ASSESSMENT AND PLAN: 49 yo with history of HTN and hyperlipidemia presented with acute anterolateral MI.  He had DES x 3 to LAD.   1. CAD: s/p anterolateral MI.  DES x 3 to LAD, procedure complicated by LAD dissection and diagonal occlusion but ultimately successful.  - Continue ASA 81 + Brilinta - Continue atorvastatin 80 mg daily.  2. Ischemic cardiomyopathy: Echo with EF 40-45%, he has apical wall motion abnormalities. He has had orthostatic-type symptoms, almost passed out yesterday, but not orthostatic when formally checked.  - Change metoprolol 25 mg  bid to Toprol XL 12.5 mg daily.  - Decrease losartan to 12.5 mg daily.  3. Lightheadedness: Had episode early this am going to bathroom.  Not orthostatic by objective numbers yesterday but got very lightheaded once during the day. - As above, cut back on BP-active meds.  - Push fluids. - Ambulate in halls today.  If does ok with ambulation this morning, think he can go home.  4. Disposition: Home today if not markedly lightheaded.  Followup with [name] in 2 wks.  Cardiac meds: ASA 81, atorvastatin 80 daily, ticagrelor 90 bid, losartan 12.5 mg daily, Toprol XL 12.5 mg daily (would take Toprol Xl and losartan in the evening).   Loralie Champagne 01/18/2015 8:04 AM

## 2015-01-18 NOTE — Discharge Instructions (Signed)
Acute Coronary Syndrome  Acute coronary syndrome (ACS) is a serious problem in which there is suddenly not enough blood and oxygen supplied to the heart. ACS may mean that one or more of the blood vessels in your heart (coronary arteries) may be blocked. ACS can result in chest pain or a heart attack (myocardial infarction or MI).  CAUSES  This condition is caused by atherosclerosis, which is the buildup of fat and cholesterol (plaque) on the inside of the arteries. Over time, the plaque may narrow or block the artery, and this will lessen blood flow to the heart. Plaque can also become weak and break off within a coronary artery to form a clot and cause a sudden blockage.  RISK FACTORS  The risks factors of this condition include:   High cholesterol levels.   High blood pressure (hypertension).   Smoking.   Diabetes.   Age.   Family history of chest pain, heart disease, or stroke.   Lack of exercise.  SYMPTOMS  The most common signs of this condition include:   Chest pain, which can be:    A crushing or squeezing in the chest.    A tightness, pressure, fullness, or heaviness in the chest.    Present for more than a few minutes, or it can stop and recur.   Pain in the arms, neck, jaw, or back.   Unexplained heartburn or indigestion.   Shortness of breath.   Nausea.   Sudden cold sweats.   Feeling light-headed or dizzy.  Sometimes, this condition has no symptoms.  DIAGNOSIS  ACS may be diagnosed through the following tests:   Electrocardiogram (ECG).   Blood tests.   Coronary angiogram. This is a procedure to look at the coronary arteries to see if there is any blockage.  TREATMENT  Treatment for ACS may include:   Healthy behavioral changes to reduce or control risk factors.   Medicine.   Coronary stenting.A stent helps to keep an artery open.   Coronary angioplasty. This procedure widens a narrowed or blocked artery.   Coronary artery bypass surgery. This will allow your blood to pass the  blockage (bypass) to reach your heart.  HOME CARE INSTRUCTIONS  Eating and Drinking   Follow a heart-healthy diet. A dietitian can you help to educate you about healthy food options and changes.   Use healthy cooking methods such as roasting, grilling, broiling, baking, poaching, steaming, or stir-frying. Talk to a dietitian to learn more about healthy cooking methods.  Medicines   Take medicines only as directed by your health care provider.   Do not take the following medicines unless your health care provider approves:    Nonsteroidal anti-inflammatory drugs (NSAIDs), such as ibuprofen, naproxen, or celecoxib.    Vitamin supplements that contain vitamin A, vitamin E, or both.    Hormone replacement therapy that contains estrogen with or without progestin.   Stop illegal drug use.  Activities   Follow an exercise program that is approved by your health care provider.   Plan rest periods when you are fatigued.  Lifestyle   Do not use any tobacco products, including cigarettes, chewing tobacco, or electronic cigarettes. If you need help quitting, ask your health care provider.   If you drink alcohol, and your health care provider approves, limit your alcohol intake to no more than 1 drink per day. One drink equals 12 ounces of beer, 5 ounces of wine, or 1 ounces of hard liquor.   Learn to manage   stress.   Maintain a healthy weight. Lose weight as approved by your health care provider.  General Instructions   Manage other health conditions, such as hypertension and diabetes, as directed by your health care provider.   Keep all follow-up visits as directed by your health care provider. This is important.   Your health care provider may ask you to monitor your blood pressure. A blood pressure reading consists of a higher number over a lower number, such as 110 over 72, written as 110/72. Ideally, your blood pressure should be:    Below 140/90 if you have no other medical conditions.    Below 130/80 if  you have diabetes or kidney disease.  SEEK IMMEDIATE MEDICAL CARE IF:   You have pain in your chest, neck, arm, jaw, stomach, or back that lasts more than a few minutes, is recurring, or is not relieved by taking medicine under your tongue (sublingual nitroglycerin).   You have profuse sweating without cause.   You have unexplained:    Heartburn or indigestion.    Shortness of breath or difficulty breathing.    Nausea or vomiting.    Fatigue.    Feelings of nervousness or anxiety.    Weakness.    Diarrhea.   You have sudden light-headedness or dizziness.   You faint.  These symptoms may represent a serious problem that is an emergency. Do not wait to see if the symptoms will go away. Get medical help right away. Call your local emergency services (911 in the [place] Do not drive yourself to the clinic or hospital.     This information is not intended to replace advice given to you by your health care provider. Make sure you discuss any questions you have with your health care provider.     Document Released: 01/25/2005 Document Revised: 02/15/2014 Document Reviewed: 05/29/2013  Elsevier Interactive Patient Education 2016 [employer]

## 2015-01-18 NOTE — Progress Notes (Signed)
CARDIAC REHAB PHASE I   PRE:  Rate/Rhythm: ST 101  BP:  Supine:   Sitting: 111/78  Standing:    SaO2: 95 RA  MODE:  Ambulation: 200 ft   POST:  Rate/Rhythm: ST 105  BP:  Supine:   Sitting: 12/92 rck 112/89  Standing:    SaO2: 98 RA  Pt ambulated in the hallway 200 ft x 1 assist.  Pt ambulated gingerly and slowly afraid of reoccurrence of dizziness.  Pt asked to walk just one block and return to room.  Offered the chair, pt sat for a few minutes but complained of feeling uncomfortable in the chair.  Pt moved to bed.  Advised pt to ambulate again with nursing staff and advance his distance as tolerated.  Wife at bedside, call bell in place.  Pt asked for [facility] to be placed closer to the bed in case he needed to get up quickly for BM.  No other needs noted. Maurice Small RN

## 2015-01-20 ENCOUNTER — Telehealth: Payer: Self-pay | Admitting: Cardiology

## 2015-01-20 NOTE — Telephone Encounter (Signed)
TCM phone call .[name] Appt is on 01/24/15 at 10am w/ Tarri Fuller at [facility] office   Thanks

## 2015-01-20 NOTE — Telephone Encounter (Signed)
LEFT DETAILED MESSAGE ON SECURE VOICEMAIL--Patient contacted regarding discharge from [facility] on 01/19/15.  Patient understands to follow up with provider  on Tarri Fuller at 01/24/15 at [facility]. Patient understands discharge instructions?  Patient understands medications and regiment?  Patient understands to bring all medications to this visit?   IN QUESTION MAY CALL BACK

## 2015-01-24 ENCOUNTER — Encounter: Payer: Self-pay | Admitting: Physician Assistant

## 2015-01-24 ENCOUNTER — Ambulatory Visit (INDEPENDENT_AMBULATORY_CARE_PROVIDER_SITE_OTHER): Payer: BLUE CROSS/BLUE SHIELD | Admitting: Physician Assistant

## 2015-01-24 VITALS — BP 104/74 | HR 66 | Ht 72.0 in | Wt 190.8 lb

## 2015-01-24 DIAGNOSIS — Z79899 Other long term (current) drug therapy: Secondary | ICD-10-CM

## 2015-01-24 DIAGNOSIS — E785 Hyperlipidemia, unspecified: Secondary | ICD-10-CM

## 2015-01-24 DIAGNOSIS — I2583 Coronary atherosclerosis due to lipid rich plaque: Secondary | ICD-10-CM

## 2015-01-24 DIAGNOSIS — I2510 Atherosclerotic heart disease of native coronary artery without angina pectoris: Secondary | ICD-10-CM

## 2015-01-24 NOTE — Progress Notes (Signed)
Patient ID: Cesar Woods, male   DOB: 15-Jul-1965, 49 y.o.   MRN: [id number]    Date:  01/24/2015   ID:  Cesar Woods, DOB 09/13/1965, MRN [id number]  PCP:  Nilda Simmer, MD  Primary Cardiologist:  Claiborne Billings   Chief Complaint  Patient presents with  . post hospital    follow-up for STEMI//pt states he has had SOB for years  . Dizziness    when he bends down//no other Sx.     History of Present Illness: Cesar Woods is a 49 y.o. 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 He was referred to the ER by [name] due to abnormal ECG and recent worsening indigestion. He reports a long-standing history of reflux, has used Prilosec and Tums for years, but had a more significant episode of substernal chest discomfort yesterday morning when he woke up. He thought that it was possibly indigestion, but he did state that it was more intense and also associated with discomfort in his arms. He contacted his primary care provider, took additional Tums, and states that eventually his symptoms went away after several hours. He had been encouraged to seek medical attention if his symptoms did not improve, also reiterated by his wife, but he states that he felt better later in the day with the exception of a vague feeling of palpitations. This morning he got up and went to work as usual, had not experienced any significant indigestion or chest discomfort whatsoever, but still felt intermittent palpitations. He went in this afternoon to see [name] at which time an ECG was obtained consistent with recent anterolateral STEMI. He was sent to the ER via EMS. On evaluation in the ER, he was hemodynamically stable without any chest discomfort at all, no shortness of breath at rest, no palpitations. He was mildly hypertensive, heart rate in the 90s. Troponin I level  obtained an abnormal at 13.9, WBC 11.7. Chest x-ray shows no acute findings. Patient works in Engineer, mining, fairly stressful job. He did not report any recent exertional chest discomfort, only recurring indigestion symptoms that have typically resolved with anti-reflux measures. He reported some shortness of breath with activity over the last few years, but no escalating pattern. He had a previous cardiac workup with Dr. Stanford Breed back in 2010 at which time Myoview study was low risk as outlined below. Patient's father had heart disease and underwent CABG at age 16. He has a history of bilateral testicular cancer, underwent orchiectomy years ago and radiation, has been cancer free for 13 years by report. He has been on testosterone replacement. Also reported history of mild hyperlipemia, but not on statin therapy. Patient went emergently to the Cath Lab and left heart catheterization revealed 99% mid LAD lesion, 95 and 70% distal LAD lesions, 40% ramus lesion and a 30% mid RCA. He underwent a very difficult but successful percutaneous coronary intervention to the LAD system. 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 He ultimately had insertion of 3 tandem Synergy DES. There was total occlusion of the diagonal vessel antegrade but evidence for development of retrograde collaterals to this diagonal vessel from the distal LAD. Patient was started on dual antiplatelet therapy with aspirin and Brilinta. Lipitor 80 mg was also started. LDL 176. He also had a 2-D echocardiogram which revealed an ejection fraction of 40-45% with akinesis of the apical anterior septal  and apical myocardium. Grade 1 diastolic dysfunction and mild MR.  He is also on metoprolol which was later changed to Toprol 12.5 daily. Losartan was decreased to 25 mg daily and then later changed to 12.5.. He did well ambulating with cardiac rehabilitation. Patient stated he was eager to do phase II. He was  volume overloaded and auto diuresed 1.5 L. Was placed on 20 mg of Lasix daily which was later discontinued. On December 9 patient had a brief loss of consciousness during ambulation. Heart rate decreased. May have been a vagal response. Discharge was held. Orthostatic vital signs were negative.  Patient presented for posthospital follow-up accompanied by his wife. They have quite a few questions which were answered.  He's been compliant with his medications. He'll also made drastic changes to her diet for everybody in the household.  He feels a little weak but otherwise is doing well. He denies nausea, vomiting, fever, chest pain, shortness of breath, orthopnea, dizziness, PND, cough, congestion, abdominal pain, hematochezia, melena, lower extremity edema, claudication.  Wt Readings from Last 3 Encounters:  01/24/15 190 lb 12.8 oz (86.546 kg)  01/16/15 194 lb 0.1 oz (88 kg)  09/28/12 202 lb (91.627 kg)     Past Medical History  Diagnosis Date  . Hyperlipidemia   . Essential hypertension   . GERD (gastroesophageal reflux disease)   . Atypical chest pain     Negative Myoview 2010  . Testicle cancer ([place])     In remission - followed by Dr. Risa Grill; had bilateral orchiectomy and XRT in past  . History of migraines   . Food allergy     Anaphylaxis with shell fish    Current Outpatient Prescriptions  Medication Sig Dispense Refill  . aspirin EC 81 MG EC tablet Take 1 tablet (81 mg total) by mouth daily.    [name] atorvastatin (LIPITOR) 80 MG tablet Take 1 tablet (80 mg total) by mouth daily at 6 PM. 30 tablet 11  . EPIPEN 2-PAK 0.3 MG/0.3ML SOAJ injection INJECT 0.3 MLS INTO THE MUSCLE ONCE **PATIENT NEEDS TO SCHEDULE AN OFFICE VISIT** 2 Device 0  . losartan (COZAAR) 25 MG tablet Take 0.5 tablets (12.5 mg total) by mouth daily. 30 tablet 11  . metoprolol succinate (TOPROL-XL) 25 MG 24 hr tablet Take 0.5 tablets (12.5 mg total) by mouth daily. 30 tablet 11  . nitroGLYCERIN (NITROSTAT) 0.4 MG SL  tablet Place 1 tablet (0.4 mg total) under the tongue every 5 (five) minutes x 3 doses as needed for chest pain. 25 tablet 12  . omeprazole (PRILOSEC) 20 MG capsule Take 20 mg by mouth daily.    . Testosterone (ANDROGEL) 40.5 MG/2.5GM (1.62%) GEL Place 5 Units onto the skin daily. 225 g 0  . ticagrelor (BRILINTA) 90 MG TABS tablet Take 1 tablet (90 mg total) by mouth 2 (two) times daily. 60 tablet 11   No current facility-administered medications for this visit.    Allergies:    Allergies  Allergen Reactions  . Shellfish Allergy Anaphylaxis  . Iodine     REACTION: Reaction not known  . Penicillins Other (See Comments)    unknown  . Ace Inhibitors Cough    Social History:  The patient  reports that he has quit smoking. He does not have any smokeless tobacco history on file. He reports that he drinks alcohol. He reports that he does not use illicit drugs.   Family history:   Family History  Problem Relation Age of Onset  . Stroke  Paternal Grandfather   . Heart attack Father     CABG at agae 23  . Hypertension    . Hyperlipidemia    . Heart attack Maternal Grandmother     ROS:  Please see the history of present illness.  All other systems reviewed and negative.   PHYSICAL EXAM: VS:  BP 104/74 mmHg  Pulse 66  Ht 6' (1.829 m)  Wt 190 lb 12.8 oz (86.546 kg)  BMI 25.87 kg/m2 Well nourished, well developed, in no acute distress HEENT: Pupils are equal round react to light accommodation extraocular movements are intact.  Neck: no JVDNo cervical lymphadenopathy. Cardiac: Regular rate and rhythm without murmurs rubs or gallops. Lungs:  clear to auscultation bilaterally, no wheezing, rhonchi or rales Abd: soft, nontender, positive bowel sounds all quadrants, no hepatosplenomegaly Ext: no lower extremity edema.  2+ radial and dorsalis pedis pulses. Skin: warm and dry Neuro:  Grossly normal    ASSESSMENT AND PLAN:  Problem List Items Addressed This Visit    Dyslipidemia    Coronary artery disease due to lipid rich plaque    Other Visit Diagnoses    Hyperlipidemia    -  Primary    Relevant Orders    Lipid panel    Polypharmacy        Relevant Orders    Hepatic function panel      Patient is doing well. Is compliant with his medications which include aspirin, statin, metoprolol and Brilinta. His blood pressure is well-controlled. His no signs of heart failure fact his weight is down to 190 pounds on our scale.  He does feel a little weak at times but does not have any chest pain. He is walking daily. He will be attending phase II cardiac rehabilitation. He's noticed statin so we'll check his LFTs and lipid panel in 6 weeks. Sounds like his wife is doing all the cooking and then made a very conscientious effort to eat healthy.  Follow-up in 3 months with [name]

## 2015-01-24 NOTE — Patient Instructions (Signed)
Your physician recommends that you return for lab work in: 6 weeks fasting.  Your physician recommends that you schedule a follow-up appointment in: 3 months with Dr Claiborne Billings.

## 2015-01-28 ENCOUNTER — Telehealth: Payer: Self-pay | Admitting: Cardiovascular Disease

## 2015-01-28 NOTE — Telephone Encounter (Signed)
12.19.16 Received completed, signed FMLA forms from [place], [place].  Notified patient on 01/28/15 forms were completed and signed.  Requested forms faxed to [employer] and mail copy.  Faxed on 12/20 and mailed patient copy. lp

## 2015-01-30 ENCOUNTER — Telehealth: Payer: Self-pay | Admitting: *Deleted

## 2015-01-30 NOTE — Telephone Encounter (Signed)
Faxed cardiac rehab order to [facility].

## 2015-02-12 ENCOUNTER — Other Ambulatory Visit: Payer: Self-pay | Admitting: Physician Assistant

## 2015-02-12 NOTE — Telephone Encounter (Signed)
Rx request sent to pharmacy.  

## 2015-02-14 ENCOUNTER — Other Ambulatory Visit: Payer: Self-pay | Admitting: Physician Assistant

## 2015-02-20 ENCOUNTER — Encounter (HOSPITAL_COMMUNITY)
Admission: RE | Admit: 2015-02-20 | Discharge: 2015-02-20 | Disposition: A | Discharge status: Home / Self Care | Payer: BLUE CROSS/BLUE SHIELD | Source: Ambulatory Visit | Attending: Cardiovascular Disease | Admitting: Cardiovascular Disease

## 2015-02-20 DIAGNOSIS — Z955 Presence of coronary angioplasty implant and graft: Secondary | ICD-10-CM | POA: Insufficient documentation

## 2015-02-20 DIAGNOSIS — I252 Old myocardial infarction: Secondary | ICD-10-CM | POA: Insufficient documentation

## 2015-02-20 NOTE — Progress Notes (Signed)
Cardiac Rehab Medication Review by a Pharmacist  Does the patient  feel that his/her medications are working for him/her?  yes  Has the patient been experiencing any side effects to the medications prescribed?  no  Does the patient measure his/her own blood pressure or blood glucose at home?  yes   Does the patient have any problems obtaining medications due to transportation or finances?   no  Understanding of regimen: good Understanding of indications: good Potential of compliance: good    Pharmacist comments: 50 y/o M presents to cardiac rehab in good spirits and no distress. Pt has a good understanding of medication regimen and is tolerating it well.  Angela Burke, PharmD Pharmacy Resident Pager: [phone] 02/20/2015 8:48 AM

## 2015-02-24 ENCOUNTER — Encounter (HOSPITAL_COMMUNITY)
Admission: RE | Admit: 2015-02-24 | Discharge: 2015-02-24 | Disposition: A | Discharge status: Home / Self Care | Payer: BLUE CROSS/BLUE SHIELD | Source: Ambulatory Visit | Attending: Cardiovascular Disease | Admitting: Cardiovascular Disease

## 2015-02-24 ENCOUNTER — Encounter (HOSPITAL_COMMUNITY): Payer: Self-pay

## 2015-02-24 DIAGNOSIS — Z955 Presence of coronary angioplasty implant and graft: Secondary | ICD-10-CM | POA: Diagnosis not present

## 2015-02-24 DIAGNOSIS — I252 Old myocardial infarction: Secondary | ICD-10-CM | POA: Diagnosis not present

## 2015-02-24 NOTE — Progress Notes (Signed)
Pt started cardiac rehab today.  Pt tolerated light exercise without difficulty. VSS, telemetry-sinus rhythm, non specific ST-T wave change, Twave inversion, asymptomatic.  Medication list reconciled.  Pt verbalized compliance with medications and denies barriers to compliance. PSYCHOSOCIAL ASSESSMENT:  PHQ-0. Pt exhibits positive coping skills, hopeful outlook with supportive family. Pt recently married.  Pt noted he had symptoms during his honeymoon in [place] however was not aware it was cardiac. No psychosocial needs identified at this time, no psychosocial interventions necessary.    Pt enjoys traveling and his Shitzu dogs. 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   Pt cardiac rehab  goal is  to develop exercise program and learn heart healthy life style.   Pt encouraged to participate in home exercise in addition to cardiac rehab activities as well as participate in lifestyle modification education classes to increase ability to achieve these goals. Pt states his wife is also interested in attending nutrition classes.  Pt oriented to exercise equipment and routine.  Understanding verbalized.

## 2015-02-26 ENCOUNTER — Encounter (HOSPITAL_COMMUNITY)
Admission: RE | Admit: 2015-02-26 | Discharge: 2015-02-26 | Disposition: A | Discharge status: Home / Self Care | Payer: BLUE CROSS/BLUE SHIELD | Source: Ambulatory Visit | Attending: Cardiovascular Disease | Admitting: Cardiovascular Disease

## 2015-02-26 DIAGNOSIS — I252 Old myocardial infarction: Secondary | ICD-10-CM | POA: Diagnosis not present

## 2015-02-28 ENCOUNTER — Encounter (HOSPITAL_COMMUNITY)
Admission: RE | Admit: 2015-02-28 | Discharge: 2015-02-28 | Disposition: A | Discharge status: Home / Self Care | Payer: BLUE CROSS/BLUE SHIELD | Source: Ambulatory Visit | Attending: Cardiovascular Disease | Admitting: Cardiovascular Disease

## 2015-02-28 DIAGNOSIS — I252 Old myocardial infarction: Secondary | ICD-10-CM | POA: Diagnosis not present

## 2015-02-28 NOTE — Progress Notes (Signed)
QUALITY OF LIFE SCORE REVIEW  Pt completed Quality of Life survey as a participant in Cardiac Rehab. Scores 21.0 or below are considered low. Pt score very low in several areas Overall 21, Health and Function 18,  And socioeconomic 20.  Patient quality of life slightly altered by physical constraints which limits ability to perform as prior to recent cardiac illness.  Pt expresses concern over sudden cardiac event at young age. 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  Pt does continue to c/o dyspnea however he states this is a longstanding symptom, occurs at rest and perhaps related to smoking history. Pt also has decreased energy which is somewhat improving. pt encouraged to discuss both these symptoms with cardiology if worsen or unrelieved.   Offered emotional support and reassurance.  Will continue to monitor and intervene as necessary.

## 2015-03-03 ENCOUNTER — Encounter (HOSPITAL_COMMUNITY)
Admission: RE | Admit: 2015-03-03 | Discharge: 2015-03-03 | Disposition: A | Discharge status: Home / Self Care | Payer: BLUE CROSS/BLUE SHIELD | Source: Ambulatory Visit | Attending: Cardiovascular Disease | Admitting: Cardiovascular Disease

## 2015-03-03 DIAGNOSIS — I252 Old myocardial infarction: Secondary | ICD-10-CM | POA: Diagnosis not present

## 2015-03-05 ENCOUNTER — Encounter (HOSPITAL_COMMUNITY)
Admission: RE | Admit: 2015-03-05 | Discharge: 2015-03-05 | Disposition: A | Discharge status: Home / Self Care | Payer: BLUE CROSS/BLUE SHIELD | Source: Ambulatory Visit | Attending: Cardiovascular Disease | Admitting: Cardiovascular Disease

## 2015-03-05 DIAGNOSIS — I252 Old myocardial infarction: Secondary | ICD-10-CM | POA: Diagnosis not present

## 2015-03-07 ENCOUNTER — Encounter (HOSPITAL_COMMUNITY)
Admission: RE | Admit: 2015-03-07 | Discharge: 2015-03-07 | Disposition: A | Discharge status: Home / Self Care | Payer: BLUE CROSS/BLUE SHIELD | Source: Ambulatory Visit | Attending: Cardiovascular Disease | Admitting: Cardiovascular Disease

## 2015-03-07 DIAGNOSIS — I252 Old myocardial infarction: Secondary | ICD-10-CM | POA: Diagnosis not present

## 2015-03-10 ENCOUNTER — Encounter (HOSPITAL_COMMUNITY)
Admission: RE | Admit: 2015-03-10 | Discharge: 2015-03-10 | Disposition: A | Discharge status: Home / Self Care | Payer: BLUE CROSS/BLUE SHIELD | Source: Ambulatory Visit | Attending: Cardiovascular Disease | Admitting: Cardiovascular Disease

## 2015-03-10 DIAGNOSIS — I252 Old myocardial infarction: Secondary | ICD-10-CM | POA: Diagnosis not present

## 2015-03-12 ENCOUNTER — Encounter (HOSPITAL_COMMUNITY)
Admission: RE | Admit: 2015-03-12 | Discharge: 2015-03-12 | Disposition: A | Discharge status: Home / Self Care | Payer: BLUE CROSS/BLUE SHIELD | Source: Ambulatory Visit | Attending: Cardiovascular Disease | Admitting: Cardiovascular Disease

## 2015-03-12 DIAGNOSIS — I252 Old myocardial infarction: Secondary | ICD-10-CM | POA: Insufficient documentation

## 2015-03-12 DIAGNOSIS — Z955 Presence of coronary angioplasty implant and graft: Secondary | ICD-10-CM | POA: Insufficient documentation

## 2015-03-12 NOTE — Progress Notes (Signed)
Cesar Woods 50 y.o. male Nutrition Note Spoke with pt.  Nutrition Survey reviewed with pt. Pt is following Step 2 of the Therapeutic Lifestyle Changes diet. Pt wants to lose wt. Pt has been trying to lose wt by watching portion sizes and making healthier food choices. Wt loss tips briefly reviewed. Pt is pre-diabetic according to pt's most recent A1c. Pre-diabetes discussed. Pt expressed understanding of the information reviewed. Pt aware of nutrition education classes offered and plans on attending nutrition classes. Lab Results  Component Value Date   HGBA1C 5.9* 01/15/2015   Wt Readings from Last 3 Encounters:  02/20/15 193 lb 12.6 oz (87.9 kg)  01/24/15 190 lb 12.8 oz (86.546 kg)  01/16/15 194 lb 0.1 oz (88 kg)   Nutrition Diagnosis ? Food-and nutrition-related knowledge deficit related to lack of exposure to information as related to diagnosis of: ? CVD ? Pre-DM ? Overweight related to excessive energy intake as evidenced by a BMI of 26.1  Nutrition Intervention ? Benefits of adopting Therapeutic Lifestyle Changes discussed when Medficts reviewed. ? Pt to attend the Portion Distortion class ? Pt to attend the  ? Nutrition I class                      ? Nutrition II class ? Continue client-centered nutrition education by RD, as part of interdisciplinary care.  Goal(s) ? Pt to identify food quantities necessary to achieve weight loss of 6-24 lb (2.7-10.9 kg) at graduation from cardiac rehab.  ? Pt to describe the benefit of including fruits, vegetables, whole grains, and low-fat dairy products in a heart healthy meal plan.  Monitor and Evaluate progress toward nutrition goal with team.  Derek Mound, M.Ed, RD, LDN, CDE 03/12/2015 2:30 PM

## 2015-03-14 ENCOUNTER — Encounter (HOSPITAL_COMMUNITY)
Admission: RE | Admit: 2015-03-14 | Discharge: 2015-03-14 | Disposition: A | Discharge status: Home / Self Care | Payer: BLUE CROSS/BLUE SHIELD | Source: Ambulatory Visit | Attending: Cardiovascular Disease | Admitting: Cardiovascular Disease

## 2015-03-14 DIAGNOSIS — I252 Old myocardial infarction: Secondary | ICD-10-CM | POA: Diagnosis not present

## 2015-03-17 ENCOUNTER — Encounter (HOSPITAL_COMMUNITY)
Admission: RE | Admit: 2015-03-17 | Discharge: 2015-03-17 | Disposition: A | Discharge status: Home / Self Care | Payer: BLUE CROSS/BLUE SHIELD | Source: Ambulatory Visit | Attending: Cardiovascular Disease | Admitting: Cardiovascular Disease

## 2015-03-17 DIAGNOSIS — I252 Old myocardial infarction: Secondary | ICD-10-CM | POA: Diagnosis not present

## 2015-03-19 ENCOUNTER — Encounter (HOSPITAL_COMMUNITY)
Admission: RE | Admit: 2015-03-19 | Discharge: 2015-03-19 | Disposition: A | Discharge status: Home / Self Care | Payer: BLUE CROSS/BLUE SHIELD | Source: Ambulatory Visit | Attending: Cardiovascular Disease | Admitting: Cardiovascular Disease

## 2015-03-19 DIAGNOSIS — I252 Old myocardial infarction: Secondary | ICD-10-CM | POA: Diagnosis not present

## 2015-03-21 ENCOUNTER — Encounter (HOSPITAL_COMMUNITY)
Admission: RE | Admit: 2015-03-21 | Discharge: 2015-03-21 | Disposition: A | Discharge status: Home / Self Care | Payer: BLUE CROSS/BLUE SHIELD | Source: Ambulatory Visit | Attending: Cardiovascular Disease | Admitting: Cardiovascular Disease

## 2015-03-21 DIAGNOSIS — I252 Old myocardial infarction: Secondary | ICD-10-CM | POA: Diagnosis not present

## 2015-03-24 ENCOUNTER — Encounter (HOSPITAL_COMMUNITY)
Admission: RE | Admit: 2015-03-24 | Discharge: 2015-03-24 | Disposition: A | Discharge status: Home / Self Care | Payer: BLUE CROSS/BLUE SHIELD | Source: Ambulatory Visit | Attending: Cardiovascular Disease | Admitting: Cardiovascular Disease

## 2015-03-24 DIAGNOSIS — I252 Old myocardial infarction: Secondary | ICD-10-CM | POA: Diagnosis not present

## 2015-03-26 ENCOUNTER — Encounter (HOSPITAL_COMMUNITY)
Admission: RE | Admit: 2015-03-26 | Discharge: 2015-03-26 | Disposition: A | Discharge status: Home / Self Care | Payer: BLUE CROSS/BLUE SHIELD | Source: Ambulatory Visit | Attending: Cardiovascular Disease | Admitting: Cardiovascular Disease

## 2015-03-26 DIAGNOSIS — I252 Old myocardial infarction: Secondary | ICD-10-CM | POA: Diagnosis not present

## 2015-03-28 ENCOUNTER — Encounter (HOSPITAL_COMMUNITY)
Admission: RE | Admit: 2015-03-28 | Discharge: 2015-03-28 | Disposition: A | Discharge status: Home / Self Care | Payer: BLUE CROSS/BLUE SHIELD | Source: Ambulatory Visit | Attending: Cardiovascular Disease | Admitting: Cardiovascular Disease

## 2015-03-28 ENCOUNTER — Encounter: Payer: Self-pay | Admitting: Cardiovascular Disease

## 2015-03-28 DIAGNOSIS — I252 Old myocardial infarction: Secondary | ICD-10-CM | POA: Diagnosis not present

## 2015-03-31 ENCOUNTER — Encounter (HOSPITAL_COMMUNITY)
Admission: RE | Admit: 2015-03-31 | Discharge: 2015-03-31 | Disposition: A | Discharge status: Home / Self Care | Payer: BLUE CROSS/BLUE SHIELD | Source: Ambulatory Visit | Attending: Cardiovascular Disease | Admitting: Cardiovascular Disease

## 2015-03-31 DIAGNOSIS — I252 Old myocardial infarction: Secondary | ICD-10-CM | POA: Diagnosis not present

## 2015-04-02 ENCOUNTER — Encounter (HOSPITAL_COMMUNITY)
Admission: RE | Admit: 2015-04-02 | Discharge: 2015-04-02 | Disposition: A | Discharge status: Home / Self Care | Payer: BLUE CROSS/BLUE SHIELD | Source: Ambulatory Visit | Attending: Cardiovascular Disease | Admitting: Cardiovascular Disease

## 2015-04-02 DIAGNOSIS — I252 Old myocardial infarction: Secondary | ICD-10-CM | POA: Diagnosis not present

## 2015-04-04 ENCOUNTER — Encounter (HOSPITAL_COMMUNITY)
Admission: RE | Admit: 2015-04-04 | Discharge: 2015-04-04 | Disposition: A | Discharge status: Home / Self Care | Payer: BLUE CROSS/BLUE SHIELD | Source: Ambulatory Visit | Attending: Cardiovascular Disease | Admitting: Cardiovascular Disease

## 2015-04-04 DIAGNOSIS — I252 Old myocardial infarction: Secondary | ICD-10-CM | POA: Diagnosis not present

## 2015-04-07 ENCOUNTER — Encounter (HOSPITAL_COMMUNITY)
Admission: RE | Admit: 2015-04-07 | Discharge: 2015-04-07 | Disposition: A | Discharge status: Home / Self Care | Payer: BLUE CROSS/BLUE SHIELD | Source: Ambulatory Visit | Attending: Cardiovascular Disease | Admitting: Cardiovascular Disease

## 2015-04-07 DIAGNOSIS — I252 Old myocardial infarction: Secondary | ICD-10-CM | POA: Diagnosis not present

## 2015-04-09 ENCOUNTER — Encounter (HOSPITAL_COMMUNITY)
Admission: RE | Admit: 2015-04-09 | Discharge: 2015-04-09 | Disposition: A | Discharge status: Home / Self Care | Payer: BLUE CROSS/BLUE SHIELD | Source: Ambulatory Visit | Attending: Cardiovascular Disease | Admitting: Cardiovascular Disease

## 2015-04-09 DIAGNOSIS — Z955 Presence of coronary angioplasty implant and graft: Secondary | ICD-10-CM | POA: Diagnosis not present

## 2015-04-09 DIAGNOSIS — I252 Old myocardial infarction: Secondary | ICD-10-CM | POA: Insufficient documentation

## 2015-04-11 ENCOUNTER — Encounter (HOSPITAL_COMMUNITY)
Admission: RE | Admit: 2015-04-11 | Discharge: 2015-04-11 | Disposition: A | Discharge status: Home / Self Care | Payer: BLUE CROSS/BLUE SHIELD | Source: Ambulatory Visit | Attending: Cardiovascular Disease | Admitting: Cardiovascular Disease

## 2015-04-11 DIAGNOSIS — I252 Old myocardial infarction: Secondary | ICD-10-CM | POA: Diagnosis not present

## 2015-04-14 ENCOUNTER — Encounter (HOSPITAL_COMMUNITY)
Admission: RE | Admit: 2015-04-14 | Discharge: 2015-04-14 | Disposition: A | Discharge status: Home / Self Care | Payer: BLUE CROSS/BLUE SHIELD | Source: Ambulatory Visit | Attending: Cardiovascular Disease | Admitting: Cardiovascular Disease

## 2015-04-14 DIAGNOSIS — I252 Old myocardial infarction: Secondary | ICD-10-CM | POA: Diagnosis not present

## 2015-04-16 ENCOUNTER — Ambulatory Visit (INDEPENDENT_AMBULATORY_CARE_PROVIDER_SITE_OTHER): Payer: BLUE CROSS/BLUE SHIELD | Admitting: Cardiovascular Disease

## 2015-04-16 ENCOUNTER — Encounter: Payer: Self-pay | Admitting: Cardiovascular Disease

## 2015-04-16 ENCOUNTER — Encounter (HOSPITAL_COMMUNITY)
Admission: RE | Admit: 2015-04-16 | Discharge: 2015-04-16 | Disposition: A | Discharge status: Home / Self Care | Payer: BLUE CROSS/BLUE SHIELD | Source: Ambulatory Visit | Attending: Cardiovascular Disease | Admitting: Cardiovascular Disease

## 2015-04-16 VITALS — BP 110/78 | HR 80 | Ht 72.0 in | Wt 195.9 lb

## 2015-04-16 DIAGNOSIS — I2109 ST elevation (STEMI) myocardial infarction involving other coronary artery of anterior wall: Secondary | ICD-10-CM

## 2015-04-16 DIAGNOSIS — E785 Hyperlipidemia, unspecified: Secondary | ICD-10-CM

## 2015-04-16 DIAGNOSIS — I10 Essential (primary) hypertension: Secondary | ICD-10-CM

## 2015-04-16 DIAGNOSIS — I2510 Atherosclerotic heart disease of native coronary artery without angina pectoris: Secondary | ICD-10-CM

## 2015-04-16 DIAGNOSIS — I2583 Coronary atherosclerosis due to lipid rich plaque: Secondary | ICD-10-CM

## 2015-04-16 DIAGNOSIS — I252 Old myocardial infarction: Secondary | ICD-10-CM | POA: Diagnosis not present

## 2015-04-16 LAB — COMPREHENSIVE METABOLIC PANEL
ALT: 14 U/L (ref 9–46)
AST: 15 U/L (ref 10–40)
Albumin: 4.3 g/dL (ref 3.6–5.1)
Alkaline Phosphatase: 96 U/L (ref 40–115)
BUN: 19 mg/dL (ref 7–25)
CHLORIDE: 101 mmol/L (ref 98–110)
CO2: 27 mmol/L (ref 20–31)
Calcium: 9.3 mg/dL (ref 8.6–10.3)
Creat: 0.78 mg/dL (ref 0.60–1.35)
Glucose, Bld: 99 mg/dL (ref 65–99)
POTASSIUM: 4.3 mmol/L (ref 3.5–5.3)
SODIUM: 139 mmol/L (ref 135–146)
TOTAL PROTEIN: 6.7 g/dL (ref 6.1–8.1)
Total Bilirubin: 1.5 mg/dL — ABNORMAL HIGH (ref 0.2–1.2)

## 2015-04-16 LAB — CBC
HCT: 44.6 % (ref 39.0–52.0)
HEMOGLOBIN: 15.1 g/dL (ref 13.0–17.0)
MCH: 29.7 pg (ref 26.0–34.0)
MCHC: 33.9 g/dL (ref 30.0–36.0)
MCV: 87.8 fL (ref 78.0–100.0)
MPV: 9.1 fL (ref 8.6–12.4)
PLATELETS: 209 10*3/uL (ref 150–400)
RBC: 5.08 MIL/uL (ref 4.22–5.81)
RDW: 13.6 % (ref 11.5–15.5)
WBC: 7.2 10*3/uL (ref 4.0–10.5)

## 2015-04-16 LAB — LIPID PANEL
CHOL/HDL RATIO: 4.5 ratio (ref <=5.0)
CHOLESTEROL: 107 mg/dL — AB (ref 125–200)
HDL: 24 mg/dL — ABNORMAL LOW (ref >=40)
LDL Cholesterol: 49 mg/dL (ref <130)
Triglycerides: 172 mg/dL — ABNORMAL HIGH (ref <150)
VLDL: 34 mg/dL — AB (ref <30)

## 2015-04-16 LAB — TSH: TSH: 0.36 mIU/L — ABNORMAL LOW (ref 0.40–4.50)

## 2015-04-16 MED ORDER — CARVEDILOL 6.25 MG PO TABS: 6.2500 mg | ORAL_TABLET | Freq: Two times a day (BID) | ORAL | Status: DC

## 2015-04-16 NOTE — Patient Instructions (Addendum)
Your physician has requested that you have an echocardiogram. Echocardiography is a painless test that uses sound waves to create images of your heart. It provides your doctor with information about the size and shape of your heart and how well your heart's chambers and valves are working. This procedure takes approximately one hour. There are no restrictions for this procedure.  Your physician recommends that you return for lab work   Your physician has recommended you make the following change in your medication:   STOP the metoprolol. This has been replaced with carvedilol. A new prescription has been sent to your pharmacy.  Your physician recommends that you schedule a follow-up appointment in: 2 months.

## 2015-04-18 ENCOUNTER — Encounter (HOSPITAL_COMMUNITY)
Admission: RE | Admit: 2015-04-18 | Discharge: 2015-04-18 | Disposition: A | Discharge status: Home / Self Care | Payer: BLUE CROSS/BLUE SHIELD | Source: Ambulatory Visit | Attending: Cardiovascular Disease | Admitting: Cardiovascular Disease

## 2015-04-18 DIAGNOSIS — I252 Old myocardial infarction: Secondary | ICD-10-CM | POA: Diagnosis not present

## 2015-04-20 ENCOUNTER — Encounter: Payer: Self-pay | Admitting: Cardiovascular Disease

## 2015-04-20 DIAGNOSIS — I2109 ST elevation (STEMI) myocardial infarction involving other coronary artery of anterior wall: Secondary | ICD-10-CM | POA: Insufficient documentation

## 2015-04-20 NOTE — Progress Notes (Signed)
Patient ID: [name], male   DOB: 20-Dec-1965, 50 y.o.   MRN: [id number]    Primary MD: [name]  HPI: BRAHEEM TOMASIK is a 50 y.o. male who presents to the office today for a  follow up cardiology evaluation following his acute coronary syndrome in December 2016.    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  In December, he developed substernal chest discomfort which he initially attributed to GERD.  This continued to recur over that day.  The following day he went to work and continued to experience vague discomfort.  He was seen by his primary physician, Dr. Scarlette Ar in his ECG was abnormal.  Changes of an anterolateral MI.  He presented to the emergency room.  He was not having any chest pain or shortness of breath or palpitations.  He underwent cardiac catheterization the following morning on 01/15/2015 which revealed significant CAD with 90% ostial LAD stenosis followed by 99% proximal LAD stenosis in the region of the first septal perforating artery with significant thrombus.  There was diffuse 50% stenosis followed by 95% ulcerated plaque in the proximal to mid LAD.  There also appeared to be an apical LAD dissection.  40% ramus intermediate, like high marginal stenosis and a large dominant RCA with 30% mid stenosis.  Large PDA and PLA vessels with septal collateralization to the proximal LAD.  He was felt to have an out of hospital late presentation anterolateral infarction secondary to his multiple high-grade stenoses.  He underwent a very difficult but successful intervention to his diffusely diseased LAD.  There was evidence for a spiral dissection in the mid distal LAD but ultimately was insertion of 3 tandem synergy DES stents (0.538, 3.038, and 3.020 mm from the mid LAD to the ostium and PTCA of the distal LAD with low-level inflation tach up his apical initial dissection.   There was total occlusion of the diagonal vessel antegrade but there was significant development of retrograde collaterals to this diagonal vessel from the distal LAD.  Subsequent, he stabilized well.  He was seen in follow-up by Tenny Craw on 01/24/2015 and was doing well without chest pain or shortness of breath.  At that time his blood pressure was controlled.  Socially, he has been participating in cardiac rehabilitation.  He is back at work and has a stressful job in Engineer, mining for [employer].  Unaware of palpitations.  He denies PND, orthopnea.  He denies presyncope.  He presents for evaluation.    Past Medical History  Diagnosis Date  . Hyperlipidemia   . Essential hypertension   . GERD (gastroesophageal reflux disease)   . Atypical chest pain     Negative Myoview 2010  . Testicle cancer ([place])     In remission - followed by Dr. Risa Grill; had bilateral orchiectomy and XRT in past  . History of migraines   . Food allergy     Anaphylaxis with shell fish    Past Surgical History  Procedure Laterality Date  . Orchiectomy Bilateral   . Cardiac catheterization N/A 01/15/2015    Procedure: Left Heart Cath and Coronary Angiography;  Surgeon: Troy Sine, MD;  Location: [place] [facility];  Service: Cardiovascular;  Laterality: N/A;  . Cardiac catheterization  01/15/2015    Procedure: Coronary Stent Intervention;  Surgeon: Troy Sine, MD;  Location: [place] [facility];  Service: Cardiovascular;;  Allergies  Allergen Reactions  . Shellfish Allergy Anaphylaxis  . Iodine     REACTION: Reaction not known  . Penicillins Other (See Comments)    unknown  . Ace Inhibitors Cough    Current Outpatient Prescriptions  Medication Sig Dispense Refill  . aspirin EC 81 MG EC tablet Take 1 tablet (81 mg total) by mouth daily.    [name] atorvastatin (LIPITOR) 80 MG tablet Take 1 tablet (80 mg total) by mouth daily at 6 PM. 30 tablet 11  . BRILINTA 90 MG TABS tablet TAKE 1 TABLET (90 MG  TOTAL) BY MOUTH 2 (TWO) TIMES DAILY. 60 tablet 2  . carvedilol (COREG) 6.25 MG tablet Take 1 tablet (6.25 mg total) by mouth 2 (two) times daily. 60 tablet 11  . EPIPEN 2-PAK 0.3 MG/0.3ML SOAJ injection INJECT 0.3 MLS INTO THE MUSCLE ONCE **PATIENT NEEDS TO SCHEDULE AN OFFICE VISIT** 2 Device 0  . losartan (COZAAR) 25 MG tablet Take 0.5 tablets (12.5 mg total) by mouth daily. 30 tablet 11  . nitroGLYCERIN (NITROSTAT) 0.4 MG SL tablet Place 1 tablet (0.4 mg total) under the tongue every 5 (five) minutes x 3 doses as needed for chest pain. 25 tablet 12  . omeprazole (PRILOSEC) 20 MG capsule Take 20 mg by mouth daily.    . Testosterone (ANDROGEL) 40.5 MG/2.5GM (1.62%) GEL Place 5 Units onto the skin daily. 225 g 0   No current facility-administered medications for this visit.    Social History   Social History  . Marital Status: Divorced    Spouse Name: N/A  . Number of Children: N/A  . Years of Education: N/A   Occupational History  . Not on file.   Social History Main Topics  . Smoking status: Former Research scientist (life sciences)  . Smokeless tobacco: Not on file     Comment: quit around 2010  . Alcohol Use: 0.0 oz/week    0 Standard drinks or equivalent per week     Comment: occasionally  . Drug Use: No     Comment: remote use of marijuana, quit a long time ago  . Sexual Activity: Not on file   Other Topics Concern  . Not on file   Social History Narrative   Occupation:Senior Trader for [employer]   Divorced    No children      Former smoker    Alcohol Use - yes         Additional social history is notable that he was born [place].  He lived for over a decade in [place].  He is married.  Former smoker.  Family History  Problem Relation Age of Onset  . Stroke Paternal Grandfather   . Heart attack Father     CABG at agae 46  . Hypertension    . Hyperlipidemia    . Heart attack Maternal Grandmother     ROS General: Negative; No fevers, chills, or night sweats HEENT:  Negative; No changes in vision or hearing, sinus congestion, difficulty swallowing Pulmonary: Negative; No cough, wheezing, shortness of breath, hemoptysis Cardiovascular: See HPI: No recurrent chest pain, presyncope, syncope, palpatations GI: Negative; No nausea, vomiting, diarrhea, or abdominal pain GU: History of testicular cancer, status post orchiectomy. Musculoskeletal: Negative; no myalgias, joint pain, or weakness Hematologic: Negative; no easy bruising, bleeding Endocrine: Negative; no heat/cold intolerance; no diabetes, Neuro: Negative; no changes in balance, headaches Skin: Negative; No rashes or skin lesions Psychiatric: Negative; No behavioral problems, depression Sleep: Negative; No snoring,  daytime sleepiness,  hypersomnolence, bruxism, restless legs, hypnogognic hallucinations. Other comprehensive 14 point system review is negative   Physical Exam BP 110/78 mmHg  Pulse 80  Ht 6' (1.829 m)  Wt 195 lb 14.4 oz (88.86 kg)  BMI 26.56 kg/m2 Wt Readings from Last 3 Encounters:  04/16/15 195 lb 14.4 oz (88.86 kg)  02/20/15 193 lb 12.6 oz (87.9 kg)  01/24/15 190 lb 12.8 oz (86.546 kg)   General: Alert, oriented, no distress.  Skin: normal turgor, no rashes, warm and dry HEENT: Normocephalic, atraumatic. Pupils equal round and reactive to light; sclera anicteric; extraocular muscles intact, No lid lag; Nose without nasal septal hypertrophy; Mouth/Parynx benign; Mallinpatti scale 2 Neck: No JVD, no carotid bruits; normal carotid upstroke Lungs: clear to ausculatation and percussion bilaterally; no wheezing or rales, normal inspiratory and expiratory effort Chest wall: without tenderness to palpitation Heart: PMI not displaced, RRR, s1 s2 normal, 1/6 systolic murmur, No diastolic murmur, no rubs, gallops, thrills, or heaves Abdomen: soft, nontender; no hepatosplenomehaly, BS+; abdominal aorta nontender and not dilated by palpation. Back: no CVA tenderness Pulses: 2+    Musculoskeletal: full range of motion, normal strength, no joint deformities Extremities: Pulses 2+, no clubbing cyanosis or edema, Homan's sign negative  Neurologic: grossly nonfocal; Cranial nerves grossly wnl Psychologic: Normal mood and affect   ECG (independently read by me): NSR at 80; QS V1-4 with T wave inversion V1-V6, I and aVL  LABS:  BMP Latest Ref Rng 04/16/2015 01/18/2015 01/17/2015  Glucose 65 - 99 mg/dL 99 104(H) 107(H)  BUN 7 - 25 mg/dL '19 18 14  ' Creatinine 0.60 - 1.35 mg/dL 0.78 0.93 0.82  Sodium 135 - 146 mmol/L 139 135 134(L)  Potassium 3.5 - 5.3 mmol/L 4.3 3.9 3.5  Chloride 98 - 110 mmol/L 101 100(L) 100(L)  CO2 20 - 31 mmol/L '27 25 25  ' Calcium 8.6 - 10.3 mg/dL 9.3 8.6(L) 8.4(L)     Hepatic Function Latest Ref Rng 04/16/2015 01/14/2015 09/14/2012  Total Protein 6.1 - 8.1 g/dL 6.7 7.0 7.2  Albumin 3.6 - 5.1 g/dL 4.3 4.1 4.2  AST 10 - 40 U/L 15 82(H) 17  ALT 9 - 46 U/L 14 34 26  Alk Phosphatase 40 - 115 U/L 96 74 67  Total Bilirubin 0.2 - 1.2 mg/dL 1.5(H) 1.6(H) 1.3(H)  Bilirubin, Direct 0.0 - 0.3 mg/dL - - 0.1    CBC Latest Ref Rng 04/16/2015 01/17/2015 01/17/2015  WBC 4.0 - 10.5 K/uL 7.2 14.1(H) 15.0(H)  Hemoglobin 13.0 - 17.0 g/dL 15.1 14.6 14.5  Hematocrit 39.0 - 52.0 % 44.6 42.9 42.4  Platelets 150 - 400 K/uL 209 208 202   Lab Results  Component Value Date   MCV 87.8 04/16/2015   MCV 87.2 01/17/2015   MCV 86.4 01/17/2015    Lab Results  Component Value Date   TSH 0.36* 04/16/2015    BNP    Component Value Date/Time   BNP 358.1* 01/17/2015 0251    ProBNP    Component Value Date/Time   PROBNP <2.0 pg/mL 10/17/2009 1834     Lipid Panel     Component Value Date/Time   CHOL 107* 04/16/2015 1028   TRIG 172* 04/16/2015 1028   HDL 24* 04/16/2015 1028   CHOLHDL 4.5 04/16/2015 1028   VLDL 34* 04/16/2015 1028   LDLCALC 49 04/16/2015 1028   LDLDIRECT 99.9 09/15/2012 1738     RADIOLOGY: No results found.    ASSESSMENT AND PLAN: Mr.  River Ambrosio is a 50 year old white male who presented  to [facility] hospital on the evening of 01/14/2015 with late presentation following an anterolateral MI which most likely occurred the day prior to his presentation.  In the emergency room, he was pain-free.  Cardiac catheterization revealed severe diffuse CAD with ulcerated plaque and thrombus and evidence for spontaneous distal LAD dissection.  He had a very difficult but successful PCI to his LAD, and ultimate insertion of 3 Synergy DES stents covering a total of 96 mm and extending from the ostium to the mid LAD.  He also had PTCA of his distal LAD dissection.  He has done remarkably well since that time and has been participating in cardiac rehabilitation.  His ECG today reveals Q waves V1 through V4 with persistent T-wave inversion anteriorly and anterolaterally.  I am recommending that he discontinue metoprolol  succinate, which he has been taking 12.5 mg daily and in its place I will switch him to carvedilol at 6.25 mg twice a day.  He continues to be on low-dose losartan at just 12.5 mg.  His blood pressure has been somewhat low.  I will plan to further titrate losartan to higher dosing as blood pressure allows.  He is on high potency statin with atorvastatin 80 mg with target LDL less than 70.  Repeat blood work was obtained today which confirms his LDL is 49.  He continues to take omeprazole for GERD.  He should continue dual antiplatelet therapy long-term.  I am scheduling him for an echo Doppler study to assess systolic function following his late presentation MI with ultimate late reperfusion.  I will see him back in the office in 2 months for reevaluation and medication titration.  Time spent: 30 minutes   Troy Sine, MD, [facility]  04/20/2015 11:06 AM

## 2015-04-21 ENCOUNTER — Encounter (HOSPITAL_COMMUNITY)
Admission: RE | Admit: 2015-04-21 | Discharge: 2015-04-21 | Disposition: A | Discharge status: Home / Self Care | Payer: BLUE CROSS/BLUE SHIELD | Source: Ambulatory Visit | Attending: Cardiovascular Disease | Admitting: Cardiovascular Disease

## 2015-04-21 DIAGNOSIS — I252 Old myocardial infarction: Secondary | ICD-10-CM | POA: Diagnosis not present

## 2015-04-22 ENCOUNTER — Telehealth: Payer: Self-pay | Admitting: Cardiovascular Disease

## 2015-04-22 NOTE — Telephone Encounter (Signed)
Patient called with MD recommendations. Voiced understanding.  

## 2015-04-22 NOTE — Telephone Encounter (Signed)
New messgae    Pt wants to speak to rn about medication change making him dizzy

## 2015-04-22 NOTE — Telephone Encounter (Signed)
Initially take 1/2 of 6.25 mg bid for 1 week; then can try to resume at 6.25 mg bid dose.

## 2015-04-22 NOTE — Telephone Encounter (Signed)
Dizziness since med change from metoprolol to carvedilol. Patient made med change on Friday.  Patient reports his HR - 10-20bpm lower than prior to med change  He reports his BP has been running 100s/70s - at cardiac rehab He reports HR was about 20bpm lower than normal at rehab  MD note from 04/16/15: His ECG today reveals Q waves V1 through V4 with persistent T-wave inversion anteriorly and anterolaterally. I am recommending that he discontinue metoprolol succinate, which he has been taking 12.5 mg daily and in its place I will switch him to carvedilol at 6.25 mg twice a day.  Message deferred to MD & Erasmo Downer to review and advise on patient's symptoms of dizziness

## 2015-04-23 ENCOUNTER — Encounter (HOSPITAL_COMMUNITY)
Admission: RE | Admit: 2015-04-23 | Discharge: 2015-04-23 | Disposition: A | Discharge status: Home / Self Care | Payer: BLUE CROSS/BLUE SHIELD | Source: Ambulatory Visit | Attending: Cardiovascular Disease | Admitting: Cardiovascular Disease

## 2015-04-23 DIAGNOSIS — I252 Old myocardial infarction: Secondary | ICD-10-CM | POA: Diagnosis not present

## 2015-04-25 ENCOUNTER — Encounter (HOSPITAL_COMMUNITY)
Admission: RE | Admit: 2015-04-25 | Discharge: 2015-04-25 | Disposition: A | Discharge status: Home / Self Care | Payer: BLUE CROSS/BLUE SHIELD | Source: Ambulatory Visit | Attending: Cardiovascular Disease | Admitting: Cardiovascular Disease

## 2015-04-25 DIAGNOSIS — I252 Old myocardial infarction: Secondary | ICD-10-CM | POA: Diagnosis not present

## 2015-04-28 ENCOUNTER — Encounter (HOSPITAL_COMMUNITY): Payer: BLUE CROSS/BLUE SHIELD

## 2015-04-30 ENCOUNTER — Encounter: Payer: Self-pay | Admitting: *Deleted

## 2015-04-30 ENCOUNTER — Encounter (HOSPITAL_COMMUNITY)
Admission: RE | Admit: 2015-04-30 | Discharge: 2015-04-30 | Disposition: A | Discharge status: Home / Self Care | Payer: BLUE CROSS/BLUE SHIELD | Source: Ambulatory Visit | Attending: Cardiovascular Disease | Admitting: Cardiovascular Disease

## 2015-04-30 DIAGNOSIS — I252 Old myocardial infarction: Secondary | ICD-10-CM | POA: Diagnosis not present

## 2015-05-01 ENCOUNTER — Other Ambulatory Visit (HOSPITAL_COMMUNITY): Payer: BLUE CROSS/BLUE SHIELD

## 2015-05-02 ENCOUNTER — Encounter (HOSPITAL_COMMUNITY)
Admission: RE | Admit: 2015-05-02 | Discharge: 2015-05-02 | Disposition: A | Discharge status: Home / Self Care | Payer: BLUE CROSS/BLUE SHIELD | Source: Ambulatory Visit | Attending: Cardiovascular Disease | Admitting: Cardiovascular Disease

## 2015-05-02 DIAGNOSIS — I252 Old myocardial infarction: Secondary | ICD-10-CM | POA: Diagnosis not present

## 2015-05-05 ENCOUNTER — Encounter (HOSPITAL_COMMUNITY)
Admission: RE | Admit: 2015-05-05 | Discharge: 2015-05-05 | Disposition: A | Discharge status: Home / Self Care | Payer: BLUE CROSS/BLUE SHIELD | Source: Ambulatory Visit | Attending: Cardiovascular Disease | Admitting: Cardiovascular Disease

## 2015-05-05 DIAGNOSIS — I252 Old myocardial infarction: Secondary | ICD-10-CM | POA: Diagnosis not present

## 2015-05-07 ENCOUNTER — Encounter (HOSPITAL_COMMUNITY)
Admission: RE | Admit: 2015-05-07 | Discharge: 2015-05-07 | Disposition: A | Discharge status: Home / Self Care | Payer: BLUE CROSS/BLUE SHIELD | Source: Ambulatory Visit | Attending: Cardiovascular Disease | Admitting: Cardiovascular Disease

## 2015-05-07 DIAGNOSIS — I252 Old myocardial infarction: Secondary | ICD-10-CM | POA: Diagnosis not present

## 2015-05-07 NOTE — Progress Notes (Signed)
Pt c/o  dizziness yesterday after increasing coreg to 6.25mg  BID.  Pt reports symptoms have resolved today.  Pt able to tolerate exercise without difficulty, VSS, telemetry normal.  Pt advised to continue medication as prescribed and notify cardiologist if symptoms persist or worsen.  Understanding verbalized.

## 2015-05-09 ENCOUNTER — Encounter (HOSPITAL_COMMUNITY)
Admission: RE | Admit: 2015-05-09 | Discharge: 2015-05-09 | Disposition: A | Discharge status: Home / Self Care | Payer: BLUE CROSS/BLUE SHIELD | Source: Ambulatory Visit | Attending: Cardiovascular Disease | Admitting: Cardiovascular Disease

## 2015-05-09 DIAGNOSIS — I252 Old myocardial infarction: Secondary | ICD-10-CM | POA: Diagnosis not present

## 2015-05-12 ENCOUNTER — Encounter (HOSPITAL_COMMUNITY)
Admission: RE | Admit: 2015-05-12 | Discharge: 2015-05-12 | Disposition: A | Discharge status: Home / Self Care | Payer: BLUE CROSS/BLUE SHIELD | Source: Ambulatory Visit | Attending: Cardiovascular Disease | Admitting: Cardiovascular Disease

## 2015-05-12 DIAGNOSIS — I252 Old myocardial infarction: Secondary | ICD-10-CM | POA: Insufficient documentation

## 2015-05-12 DIAGNOSIS — Z955 Presence of coronary angioplasty implant and graft: Secondary | ICD-10-CM | POA: Insufficient documentation

## 2015-05-14 ENCOUNTER — Encounter (HOSPITAL_COMMUNITY)
Admission: RE | Admit: 2015-05-14 | Discharge: 2015-05-14 | Disposition: A | Discharge status: Home / Self Care | Payer: BLUE CROSS/BLUE SHIELD | Source: Ambulatory Visit | Attending: Cardiovascular Disease | Admitting: Cardiovascular Disease

## 2015-05-14 DIAGNOSIS — I252 Old myocardial infarction: Secondary | ICD-10-CM | POA: Diagnosis not present

## 2015-05-16 ENCOUNTER — Encounter (HOSPITAL_COMMUNITY)
Admission: RE | Admit: 2015-05-16 | Discharge: 2015-05-16 | Disposition: A | Discharge status: Home / Self Care | Payer: BLUE CROSS/BLUE SHIELD | Source: Ambulatory Visit | Attending: Cardiovascular Disease | Admitting: Cardiovascular Disease

## 2015-05-16 DIAGNOSIS — I252 Old myocardial infarction: Secondary | ICD-10-CM | POA: Diagnosis not present

## 2015-05-19 ENCOUNTER — Encounter (HOSPITAL_COMMUNITY)
Admission: RE | Admit: 2015-05-19 | Discharge: 2015-05-19 | Disposition: A | Discharge status: Home / Self Care | Payer: BLUE CROSS/BLUE SHIELD | Source: Ambulatory Visit | Attending: Cardiovascular Disease | Admitting: Cardiovascular Disease

## 2015-05-19 ENCOUNTER — Encounter (HOSPITAL_COMMUNITY): Payer: Self-pay

## 2015-05-19 DIAGNOSIS — I252 Old myocardial infarction: Secondary | ICD-10-CM | POA: Diagnosis not present

## 2015-05-19 NOTE — Progress Notes (Signed)
Pt graduated from cardiac rehab program today with completion of 36 exercise sessions in Phase II. Pt maintained good attendance and progressed nicely during his participation in rehab as evidenced by increased MET level.   Medication list reconciled. Repeat  PHQ score- 0. Pt quality of life scores significantly increased.    Pt has made significant lifestyle changes and should be commended for his success. Pt feels he has achieved his goals during cardiac rehab, which include improving his heart health and decreasing risk factors. Pt has demonstrated positive dietary changes and developed exercise habit.    Pt plans to continue exercise in cardiac maintenance program.

## 2015-05-21 ENCOUNTER — Encounter (HOSPITAL_COMMUNITY): Payer: BLUE CROSS/BLUE SHIELD

## 2015-05-23 ENCOUNTER — Encounter (HOSPITAL_COMMUNITY): Payer: BLUE CROSS/BLUE SHIELD

## 2015-05-26 ENCOUNTER — Encounter (HOSPITAL_COMMUNITY): Payer: BLUE CROSS/BLUE SHIELD

## 2015-05-28 ENCOUNTER — Encounter (HOSPITAL_COMMUNITY): Payer: BLUE CROSS/BLUE SHIELD

## 2015-05-30 ENCOUNTER — Other Ambulatory Visit: Payer: Self-pay

## 2015-05-30 ENCOUNTER — Ambulatory Visit (HOSPITAL_COMMUNITY): Payer: BLUE CROSS/BLUE SHIELD | Attending: Cardiology

## 2015-05-30 ENCOUNTER — Encounter (HOSPITAL_COMMUNITY): Payer: BLUE CROSS/BLUE SHIELD

## 2015-05-30 DIAGNOSIS — R29898 Other symptoms and signs involving the musculoskeletal system: Secondary | ICD-10-CM | POA: Insufficient documentation

## 2015-05-30 DIAGNOSIS — I2583 Coronary atherosclerosis due to lipid rich plaque: Secondary | ICD-10-CM

## 2015-05-30 DIAGNOSIS — I2510 Atherosclerotic heart disease of native coronary artery without angina pectoris: Secondary | ICD-10-CM | POA: Diagnosis not present

## 2015-05-30 DIAGNOSIS — I071 Rheumatic tricuspid insufficiency: Secondary | ICD-10-CM | POA: Diagnosis not present

## 2015-05-30 DIAGNOSIS — E785 Hyperlipidemia, unspecified: Secondary | ICD-10-CM | POA: Diagnosis not present

## 2015-05-30 DIAGNOSIS — I10 Essential (primary) hypertension: Secondary | ICD-10-CM | POA: Diagnosis not present

## 2015-05-30 MED ORDER — PERFLUTREN LIPID MICROSPHERE
2.0000 mL | Freq: Once | INTRAVENOUS | Status: AC
  Administered 2015-05-30: 2 mL via INTRAVENOUS

## 2015-06-25 ENCOUNTER — Ambulatory Visit (INDEPENDENT_AMBULATORY_CARE_PROVIDER_SITE_OTHER): Payer: BLUE CROSS/BLUE SHIELD | Admitting: Cardiovascular Disease

## 2015-06-25 ENCOUNTER — Encounter: Payer: Self-pay | Admitting: Cardiovascular Disease

## 2015-06-25 VITALS — BP 126/89 | HR 88 | Ht 72.0 in | Wt 194.8 lb

## 2015-06-25 DIAGNOSIS — K219 Gastro-esophageal reflux disease without esophagitis: Secondary | ICD-10-CM | POA: Diagnosis not present

## 2015-06-25 DIAGNOSIS — I10 Essential (primary) hypertension: Secondary | ICD-10-CM | POA: Diagnosis not present

## 2015-06-25 DIAGNOSIS — I2510 Atherosclerotic heart disease of native coronary artery without angina pectoris: Secondary | ICD-10-CM | POA: Diagnosis not present

## 2015-06-25 DIAGNOSIS — E785 Hyperlipidemia, unspecified: Secondary | ICD-10-CM | POA: Diagnosis not present

## 2015-06-25 DIAGNOSIS — I2583 Coronary atherosclerosis due to lipid rich plaque: Secondary | ICD-10-CM

## 2015-06-25 MED ORDER — CARVEDILOL 12.5 MG PO TABS: 12.5000 mg | ORAL_TABLET | Freq: Two times a day (BID) | ORAL | Status: DC

## 2015-06-25 MED ORDER — LOSARTAN POTASSIUM 25 MG PO TABS: 25.0000 mg | ORAL_TABLET | Freq: Every day | ORAL | Status: DC

## 2015-06-25 NOTE — Patient Instructions (Signed)
Your physician has recommended you make the following change in your medication:   1.) the losartan has been increased to 25 mg at night.  2.) the carvedilol has been increased to 1& 1/2 tablet twice a day for two weeks. Then start new prescription for the 12.5 mg.  3.) start over the counter fish oil.  4.) start zyrtec over the counter as needed.  Your physician recommends that you schedule a follow-up appointment in: 3-4 months.

## 2015-06-25 NOTE — Progress Notes (Signed)
Patient ID: Cesar Woods, male   DOB: 1965-11-21, 50 y.o.   MRN: [id number]    Primary MD: [name]  HPI: Cesar Woods is a 50 y.o. male who presents to the office today for a  follow up cardiology evaluation following his acute coronary syndrome in December 2016.    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  In December, he developed substernal chest discomfort which he initially attributed to GERD.  This continued to recur over that day.  The following day he went to work and continued to experience vague discomfort.  He was seen by his primary physician, Dr. Scarlette Ar in his ECG was abnormal.  Changes of an anterolateral MI.  He presented to the emergency room.  He was not having any chest pain or shortness of breath or palpitations.  He underwent cardiac catheterization the following morning on 01/15/2015 which revealed significant CAD with 90% ostial LAD stenosis followed by 99% proximal LAD stenosis in the region of the first septal perforating artery with significant thrombus.  There was diffuse 50% stenosis followed by 95% ulcerated plaque in the proximal to mid LAD.  There also appeared to be an apical LAD dissection.  40% ramus intermediate, like high marginal stenosis and a large dominant RCA with 30% mid stenosis.  Large PDA and PLA vessels with septal collateralization to the proximal LAD.  He was felt to have an out of hospital late presentation anterolateral infarction secondary to his multiple high-grade stenoses.  He underwent a very difficult but successful intervention to his diffusely diseased LAD.  There was evidence for a spiral dissection in the mid distal LAD but ultimately was insertion of 3 tandem synergy DES stents (0.538, 3.038, and 3.020 mm from the mid LAD to the ostium and PTCA of the distal LAD with low-level inflation tach up his apical initial dissection.   There was total occlusion of the diagonal vessel antegrade but there was significant development of retrograde collaterals to this diagonal vessel from the distal LAD.  Subsequent, he stabilized well.  He was seen in follow-up by Tenny Craw on 01/24/2015 and was doing well without chest pain or shortness of breath.  At that time his blood pressure was controlled.  He participated in the cardiac rehabilitation program.  He is back at work and has a stressful job in Engineer, mining for [employer].   When I saw him for follow-up evaluation in March 2017.  His ECG showed Q waves in V1 through V4 4 with persistent T-wave inversion anteriorly and anterolaterally.  I recommended that he discontinue metoprolol succinate and changed him to carvedilol.  He is now on carvedilol at 6.25 mg twice a day and is tolerating this well.  He continues to be on low-dose losartan 12.5 mg.  I scheduled him for follow-up echo Doppler study to reassess his LV function.  This was done on 05/30/2015.  Ejection fraction was now 40-45% and there was evidence for akinesis of the apical myocardium.  There was grade 1 diastolic dysfunction.  He presents to the office today for further evaluation.  He denies recurrent chest pain.  He denies palpitations.  He denies PND or orthopnea.  Past Medical History  Diagnosis Date  . Hyperlipidemia   . Essential hypertension   . GERD (gastroesophageal reflux disease)   . Atypical chest pain     Negative Myoview 2010  . Testicle  cancer (HCC)     In remission - followed by Dr. Grapey; had bilateral orchiectomy and XRT in past  . History of migraines   . Food allergy     Anaphylaxis with shell fish    Past Surgical History  Procedure Laterality Date  . Orchiectomy Bilateral   . Cardiac catheterization N/A 01/15/2015    Procedure: Left Heart Cath and Coronary Angiography;  Surgeon: Thomas A Kelly, MD;  Location: [facility];  Service: Cardiovascular;  Laterality: N/A;  . Cardiac  catheterization  01/15/2015    Procedure: Coronary Stent Intervention;  Surgeon: Thomas A Kelly, MD;  Location: [facility];  Service: Cardiovascular;;    Allergies  Allergen Reactions  . Shellfish Allergy Anaphylaxis  . Iodine     REACTION: Reaction not known  . Penicillins Other (See Comments)    unknown  . Ace Inhibitors Cough    Current Outpatient Prescriptions  Medication Sig Dispense Refill  . aspirin EC 81 MG EC tablet Take 1 tablet (81 mg total) by mouth daily.    . atorvastatin (LIPITOR) 80 MG tablet Take 1 tablet (80 mg total) by mouth daily at 6 PM. 30 tablet 11  . BRILINTA 90 MG TABS tablet TAKE 1 TABLET (90 MG TOTAL) BY MOUTH 2 (TWO) TIMES DAILY. 60 tablet 2  . EPIPEN 2-PAK 0.3 MG/0.3ML SOAJ injection INJECT 0.3 MLS INTO THE MUSCLE ONCE **PATIENT NEEDS TO SCHEDULE AN OFFICE VISIT** 2 Device 0  . losartan (COZAAR) 25 MG tablet Take 1 tablet (25 mg total) by mouth at bedtime. 30 tablet 11  . nitroGLYCERIN (NITROSTAT) 0.4 MG SL tablet Place 1 tablet (0.4 mg total) under the tongue every 5 (five) minutes x 3 doses as needed for chest pain. 25 tablet 12  . omeprazole (PRILOSEC) 20 MG capsule Take 20 mg by mouth daily.    . Testosterone (ANDROGEL) 40.5 MG/2.5GM (1.62%) GEL Place 5 Units onto the skin daily. 225 g 0  . carvedilol (COREG) 12.5 MG tablet Take 1 tablet (12.5 mg total) by mouth 2 (two) times daily. 60 tablet 6   No current facility-administered medications for this visit.    Social History   Social History  . Marital Status: Divorced    Spouse Name: N/A  . Number of Children: N/A  . Years of Education: N/A   Occupational History  . Not on file.   Social History Main Topics  . Smoking status: Former Smoker  . Smokeless tobacco: Not on file     Comment: quit around 2010  . Alcohol Use: 0.0 oz/week    0 Standard drinks or equivalent per week     Comment: occasionally  . Drug Use: No     Comment: remote use of marijuana, quit a long time ago  .  Sexual Activity: Not on file   Other Topics Concern  . Not on file   Social History Narrative   Occupation:Senior Trader for [employer]   Divorced    No children      Former smoker    Alcohol Use - yes         Additional social history is notable that he was born in [place].  He lived for over a decade in [place].  He is married.  Former smoker.  Family History  Problem Relation Age of Onset  . Stroke Paternal Grandfather   . Heart attack Father     CABG at agae 59  . Hypertension    .   Hyperlipidemia    . Heart attack Maternal Grandmother     ROS General: Negative; No fevers, chills, or night sweats HEENT: Negative; No changes in vision or hearing, sinus congestion, difficulty swallowing Pulmonary: Negative; No cough, wheezing, shortness of breath, hemoptysis Cardiovascular: See HPI: No recurrent chest pain, presyncope, syncope, palpatations GI: Negative; No nausea, vomiting, diarrhea, or abdominal pain GU: History of testicular cancer, status post orchiectomy. Musculoskeletal: Negative; no myalgias, joint pain, or weakness Hematologic: Negative; no easy bruising, bleeding Endocrine: Negative; no heat/cold intolerance; no diabetes, Neuro: Negative; no changes in balance, headaches Skin: Negative; No rashes or skin lesions Psychiatric: Negative; No behavioral problems, depression Sleep: Negative; No snoring,  daytime sleepiness, hypersomnolence, bruxism, restless legs, hypnogognic hallucinations. Other comprehensive 14 point system review is negative   Physical Exam BP 126/89 mmHg  Pulse 88  Ht 6' (1.829 m)  Wt 194 lb 12.8 oz (88.361 kg)  BMI 26.41 kg/m2 Wt Readings from Last 3 Encounters:  06/25/15 194 lb 12.8 oz (88.361 kg)  04/16/15 195 lb 14.4 oz (88.86 kg)  02/20/15 193 lb 12.6 oz (87.9 kg)   General: Alert, oriented, no distress.  Skin: normal turgor, no rashes, warm and dry HEENT: Normocephalic, atraumatic. Pupils equal round and reactive to  light; sclera anicteric; extraocular muscles intact, No lid lag; Nose without nasal septal hypertrophy; Mouth/Parynx benign; Mallinpatti scale 2 Neck: No JVD, no carotid bruits; normal carotid upstroke Lungs: clear to ausculatation and percussion bilaterally; no wheezing or rales, normal inspiratory and expiratory effort Chest wall: without tenderness to palpitation Heart: PMI not displaced, RRR, s1 s2 normal, 1/6 systolic murmur, No diastolic murmur, no rubs, gallops, thrills, or heaves Abdomen: soft, nontender; no hepatosplenomehaly, BS+; abdominal aorta nontender and not dilated by palpation. Back: no CVA tenderness Pulses: 2+  Musculoskeletal: full range of motion, normal strength, no joint deformities Extremities: Pulses 2+, no clubbing cyanosis or edema, Homan's sign negative  Neurologic: grossly nonfocal; Cranial nerves grossly wnl Psychologic: Normal mood and affect  ECG (independently read by me): Normal sinus rhythm at 79 bpm.  T-wave inversion V2 through V6 and 1 and aVL concordant with his prior anterior to anterolateral MI.  March 2017 ECG (independently read by me): NSR at 80; QS V1-4 with T wave inversion V1-V6, I and aVL  LABS:  BMP Latest Ref Rng 04/16/2015 01/18/2015 01/17/2015  Glucose 65 - 99 mg/dL 99 104(H) 107(H)  BUN 7 - 25 mg/dL _0 Creatinine 0.60 - 1.35 mg/dL 0.78 0.93 0.82  Sodium 135 - 146 mmol/L 139 135 134(L)  Potassium 3.5 - 5.3 mmol/L 4.3 3.9 3.5  Chloride 98 - 110 mmol/L 101 100(L) 100(L)  CO2 20 - 31 mmol/L _1 Calcium 8.6 - 10.3 mg/dL 9.3 8.6(L) 8.4(L)     Hepatic Function Latest Ref Rng 04/16/2015 01/14/2015 09/14/2012  Total Protein 6.1 - 8.1 g/dL 6.7 7.0 7.2  Albumin 3.6 - 5.1 g/dL 4.3 4.1 4.2  AST 10 - 40 U/L 15 82(H) 17  ALT 9 - 46 U/L 14 34 26  Alk Phosphatase 40 - 115 U/L 96 74 67  Total Bilirubin 0.2 - 1.2 mg/dL 1.5(H) 1.6(H) 1.3(H)  Bilirubin, Direct 0.0 - 0.3 mg/dL - - 0.1    CBC Latest Ref Rng 04/16/2015 01/17/2015 01/17/2015    WBC 4.0 - 10.5 K/uL 7.2 14.1(H) 15.0(H)  Hemoglobin 13.0 - 17.0 g/dL 15.1 14.6 14.5  Hematocrit 39.0 - 52.0 % 44.6 42.9 42.4  Platelets 150 - 400 K/uL 209 208 202  Lab Results  Component Value Date   MCV 87.8 04/16/2015   MCV 87.2 01/17/2015   MCV 86.4 01/17/2015    Lab Results  Component Value Date   TSH 0.36* 04/16/2015    BNP    Component Value Date/Time   BNP 358.1* 01/17/2015 0251    ProBNP    Component Value Date/Time   PROBNP <2.0 pg/mL 10/17/2009 1834     Lipid Panel     Component Value Date/Time   CHOL 107* 04/16/2015 1028   TRIG 172* 04/16/2015 1028   HDL 24* 04/16/2015 1028   CHOLHDL 4.5 04/16/2015 1028   VLDL 34* 04/16/2015 1028   LDLCALC 49 04/16/2015 1028   LDLDIRECT 99.9 09/15/2012 1738     RADIOLOGY: No results found.    ASSESSMENT AND PLAN: Mr. Judah Stahlil is a 49-year-old white male who presented to [facility] on the evening of 01/14/2015 with late presentation following an anterolateral MI which most likely occurred the day prior to his presentation.  In the emergency room, he was pain-free.  Cardiac catheterization revealed severe diffuse CAD with ulcerated plaque and thrombus and evidence for spontaneous distal LAD dissection.  He had a very difficult but successful PCI to his LAD, and ultimate insertion of 3 Synergy DES stents covering a total of 96 mm and extending from the ostium to the mid LAD.  He also had PTCA of his distal LAD dissection.  He has done remarkably well since that time and has been participating in cardiac rehabilitation.  When I last saw him, I switched him to carvedilol and he developed some transient dizziness when he started at 6.25 mg but went was this was readjusted to 3.125 for several weeks and then upgraded to 6.25 mg twice a day.  He has tolerated this well.  I reviewed his most recent echo Doppler data which shows an EF of 40-45% and apical akinesis.  I will now attempt to slightly titrate his ARB therapy  and he will increase losartan to 25 mg at bedtime.  For the next 2 weeks.  He will increase his carvedilol to 9.375 mg twice a day since his resting pulse typically runs in the upper 70s to 80s and after 2 weeks if his blood pressure remains stable, he will titrate this to 12.5 mg twice a day.  I reviewed his recent laboratory.  His LDL cholesterol is excellent.  I have suggested the addition of omega-3 fatty acids in light of his continued mild triglyceride elevation.  He has allergy symptoms and I have suggested over-the-counter Zyrtec but to avoid Zyrtec-D.  He will continue to exercise.  I will see him in 3-4 months for cardiology reevaluation.  Time spent: 25 minutes  Thomas A. Kelly, MD, FACC  06/25/2015 2:05 PM    

## 2015-08-26 ENCOUNTER — Other Ambulatory Visit: Payer: Self-pay

## 2015-08-26 MED ORDER — LOSARTAN POTASSIUM 25 MG PO TABS: 25.0000 mg | ORAL_TABLET | Freq: Every day | ORAL | Status: DC

## 2015-08-28 ENCOUNTER — Telehealth: Payer: Self-pay

## 2015-08-28 NOTE — Telephone Encounter (Signed)
Patient walked in office requesting health care provider form from [facility] be filled out and faxed.Form completed and faxed to fax # [phone].

## 2015-10-15 ENCOUNTER — Ambulatory Visit (INDEPENDENT_AMBULATORY_CARE_PROVIDER_SITE_OTHER): Payer: BLUE CROSS/BLUE SHIELD | Admitting: Cardiovascular Disease

## 2015-10-15 ENCOUNTER — Encounter: Payer: Self-pay | Admitting: Cardiovascular Disease

## 2015-10-15 VITALS — BP 100/76 | HR 83 | Ht 72.0 in | Wt 201.2 lb

## 2015-10-15 DIAGNOSIS — E785 Hyperlipidemia, unspecified: Secondary | ICD-10-CM

## 2015-10-15 DIAGNOSIS — I2510 Atherosclerotic heart disease of native coronary artery without angina pectoris: Secondary | ICD-10-CM | POA: Diagnosis not present

## 2015-10-15 DIAGNOSIS — I2583 Coronary atherosclerosis due to lipid rich plaque: Principal | ICD-10-CM

## 2015-10-15 DIAGNOSIS — I10 Essential (primary) hypertension: Secondary | ICD-10-CM

## 2015-10-15 NOTE — Patient Instructions (Signed)
Your physician has requested that you have en exercise stress myoview in [place] For further information please visit [email or web address]. Please follow instruction sheet, as given.    Your physician recommends that you schedule a follow-up appointment in: JAN 2018.

## 2015-10-17 NOTE — Progress Notes (Signed)
Patient ID: Cesar Woods, male   DOB: 07/23/1965, 50 y.o.   MRN: [id number]    Primary MD: [name]  HPI: Cesar Woods is a 50 y.o. male who presents to the office today for a 4 month follow up cardiology evaluation.  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  In December, he developed substernal chest discomfort which he initially attributed to GERD.  This continued to recur over that day.  The following day he went to work and continued to experience vague discomfort.  He was seen by his primary physician, Dr. Scarlette Woods in his ECG was abnormal.  Changes of an anterolateral MI.  He presented to the emergency room.  He was not having any chest pain or shortness of breath or palpitations.  He underwent cardiac catheterization the following morning on 01/15/2015 which revealed significant CAD with 90% ostial LAD stenosis followed by 99% proximal LAD stenosis in the region of the first septal perforating artery with significant thrombus.  There was diffuse 50% stenosis followed by 95% ulcerated plaque in the proximal to mid LAD.  There also appeared to be an apical LAD dissection.  40% ramus intermediate, like high marginal stenosis and a large dominant RCA with 30% mid stenosis.  Large PDA and PLA vessels with septal collateralization to the proximal LAD.  He was felt to have an out of hospital late presentation anterolateral infarction secondary to his multiple high-grade stenoses.  He underwent a very difficult but successful intervention to his diffusely diseased LAD.  There was evidence for a spiral dissection in the mid distal LAD but ultimately was insertion of 3 tandem synergy DES stents (0.538, 3.038, and 3.020 mm from the mid LAD to the ostium and PTCA of the distal LAD with low-level inflation tach up his apical initial dissection.  There was total occlusion of the diagonal vessel  antegrade but there was significant development of retrograde collaterals to this diagonal vessel from the distal LAD.  Subsequent, he stabilized well.  He was seen in follow-up by Cesar Woods on 01/24/2015 and was doing well without chest pain or shortness of breath.  At that time his blood pressure was controlled.  He participated in the cardiac rehabilitation program.  He is back at work and has a stressful job in Engineer, mining for [employer].   When I saw him for follow-up evaluation in March 2017.  His ECG showed Q waves in V1 through V4 4 with persistent T-wave inversion anteriorly and anterolaterally.  I recommended that he discontinue metoprolol succinate and changed him to carvedilol.  He is now on carvedilol at 6.25 mg twice a day and is tolerating this well.  He continues to be on low-dose losartan 12.5 mg.  I scheduled him for follow-up echo Doppler study to reassess his LV function.  This was done on 05/30/2015.  Ejection fraction was now 40-45% and there was evidence for akinesis of the apical myocardium.  There was grade 1 diastolic dysfunction.  And I saw him in May 2017 for follow-up evaluation, he denied recurrent chest pain, PND or orthopnea.  At time, I slowly titrated ARB therapy and increase losartan to 25 mg at bedtime.  So increased his carvedilol to 9.375 mg twice a day for 2 weeks and further titrated this to 12.5 mg daily.  He has felt well.  He denies recurrent palpitations.  He denies chest pain.  He tells me he will be leaving for [place] tomorrow for work.  He denies presyncope or syncope.  He presents for evaluation  Past Medical History:  Diagnosis Date  . Atypical chest pain    Negative Myoview 2010  . Essential hypertension   . Food allergy    Anaphylaxis with shell fish  . GERD (gastroesophageal reflux disease)   . History of migraines   . Hyperlipidemia   . Testicle cancer ([place])    In remission - followed by Dr. Risa Woods; had bilateral orchiectomy and XRT in past     Past Surgical History:  Procedure Laterality Date  . CARDIAC CATHETERIZATION N/A 01/15/2015   Procedure: Left Heart Cath and Coronary Angiography;  Surgeon: Cesar Sine, MD;  Location: [facility];  Service: Cardiovascular;  Laterality: N/A;  . CARDIAC CATHETERIZATION  01/15/2015   Procedure: Coronary Stent Intervention;  Surgeon: Cesar Sine, MD;  Location: [facility];  Service: Cardiovascular;;  . ORCHIECTOMY Bilateral     Allergies  Allergen Reactions  . Shellfish Allergy Anaphylaxis  . Iodine     REACTION: Reaction not known  . Penicillins Other (See Comments)    unknown  . Ace Inhibitors Cough    Current Outpatient Prescriptions  Medication Sig Dispense Refill  . aspirin EC 81 MG EC tablet Take 1 tablet (81 mg total) by mouth daily.    [name] atorvastatin (LIPITOR) 80 MG tablet Take 1 tablet (80 mg total) by mouth daily at 6 PM. 30 tablet 11  . BRILINTA 90 MG TABS tablet TAKE 1 TABLET (90 MG TOTAL) BY MOUTH 2 (TWO) TIMES DAILY. 60 tablet 2  . carvedilol (COREG) 12.5 MG tablet Take 1 tablet (12.5 mg total) by mouth 2 (two) times daily. 60 tablet 6  . EPIPEN 2-PAK 0.3 MG/0.3ML SOAJ injection INJECT 0.3 MLS INTO THE MUSCLE ONCE **PATIENT NEEDS TO SCHEDULE AN OFFICE VISIT** 2 Device 0  . losartan (COZAAR) 25 MG tablet Take 1 tablet (25 mg total) by mouth at bedtime. 90 tablet 3  . nitroGLYCERIN (NITROSTAT) 0.4 MG SL tablet Place 1 tablet (0.4 mg total) under the tongue every 5 (five) minutes x 3 doses as needed for chest pain. 25 tablet 12  . omeprazole (PRILOSEC) 20 MG capsule Take 20 mg by mouth daily.    . Testosterone (ANDROGEL) 40.5 MG/2.5GM (1.62%) GEL Place 5 Units onto the skin daily. 225 g 0   No current facility-administered medications for this visit.     Social History   Social History  . Marital status: Married    Spouse name: N/A  . Number of children: N/A  . Years of education: N/A   Occupational History  . Not on file.   Social History  Main Topics  . Smoking status: Former Research scientist (life sciences)  . Smokeless tobacco: Not on file     Comment: quit around 2010  . Alcohol use 0.0 oz/week     Comment: occasionally  . Drug use: No     Comment: remote use of marijuana, quit a long time ago  . Sexual activity: Not on file   Other Topics Concern  . Not on file   Social History Narrative   Occupation:Senior Trader for [employer]   Divorced    No children      Former smoker    Alcohol Use - yes         Additional social history is notable that he was born in [place], [place].  He lived  for over a decade in [place].  He is married.  Former smoker.  Family History  Problem Relation Age of Onset  . Stroke Paternal Grandfather   . Heart attack Father     CABG at agae 41  . Hypertension    . Hyperlipidemia    . Heart attack Maternal Grandmother     ROS General: Negative; No fevers, chills, or night sweats HEENT: Negative; No changes in vision or hearing, sinus congestion, difficulty swallowing Pulmonary: Negative; No cough, wheezing, shortness of breath, hemoptysis Cardiovascular: See HPI: No recurrent chest pain, presyncope, syncope, palpatations GI: Negative; No nausea, vomiting, diarrhea, or abdominal pain GU: History of testicular cancer, status post orchiectomy. Musculoskeletal: Negative; no myalgias, joint pain, or weakness Hematologic: Negative; no easy bruising, bleeding Endocrine: Negative; no heat/cold intolerance; no diabetes, Neuro: Negative; no changes in balance, headaches Skin: Negative; No rashes or skin lesions Psychiatric: Negative; No behavioral problems, depression Sleep: Negative; No snoring,  daytime sleepiness, hypersomnolence, bruxism, restless legs, hypnogognic hallucinations. Other comprehensive 14 point system review is negative   Physical Exam BP 100/76   Pulse 83   Ht 6' (1.829 m)   Wt 201 lb 3.2 oz (91.3 kg)   BMI 27.29 kg/m    Repeat blood pressure by me 110/70.  Wt Readings from Last  3 Encounters:  10/15/15 201 lb 3.2 oz (91.3 kg)  06/25/15 194 lb 12.8 oz (88.4 kg)  04/16/15 195 lb 14.4 oz (88.9 kg)   General: Alert, oriented, no distress.  Skin: normal turgor, no rashes, warm and dry HEENT: Normocephalic, atraumatic. Pupils equal round and reactive to light; sclera anicteric; extraocular muscles intact, No lid lag; Nose without nasal septal hypertrophy; Mouth/Parynx benign; Mallinpatti scale 2 Neck: No JVD, no carotid bruits; normal carotid upstroke Lungs: clear to ausculatation and percussion bilaterally; no wheezing or rales, normal inspiratory and expiratory effort Chest wall: without tenderness to palpitation Heart: PMI not displaced, RRR, s1 s2 normal, 1/6 systolic murmur, No diastolic murmur, no rubs, gallops, thrills, or heaves Abdomen: soft, nontender; no hepatosplenomehaly, BS+; abdominal aorta nontender and not dilated by palpation. Back: no CVA tenderness Pulses: 2+  Musculoskeletal: full range of motion, normal strength, no joint deformities Extremities: Pulses 2+, no clubbing cyanosis or edema, Homan's sign negative  Neurologic: grossly nonfocal; Cranial nerves grossly wnl Psychologic: Normal mood and affect  ECG (independently read by me): Sinus rhythm at 83 bpm.  QRS complex V1-V6  concordant with his large anterolateral MI.  May 2017 ECG (independently read by me): Normal sinus rhythm at 79 bpm.  T-wave inversion V2 through V6 and 1 and aVL concordant with his prior anterior to anterolateral MI.  March 2017 ECG (independently read by me): NSR at 80; QS V1-4 with T wave inversion V1-V6, I and aVL  LABS:  BMP Latest Ref Rng & Units 04/16/2015 01/18/2015 01/17/2015  Glucose 65 - 99 mg/dL 99 104(H) 107(H)  BUN 7 - 25 mg/dL '19 18 14  ' Creatinine 0.60 - 1.35 mg/dL 0.78 0.93 0.82  Sodium 135 - 146 mmol/L 139 135 134(L)  Potassium 3.5 - 5.3 mmol/L 4.3 3.9 3.5  Chloride 98 - 110 mmol/L 101 100(L) 100(L)  CO2 20 - 31 mmol/L '27 25 25  ' Calcium 8.6 - 10.3  mg/dL 9.3 8.6(L) 8.4(L)     Hepatic Function Latest Ref Rng & Units 04/16/2015 01/14/2015 09/14/2012  Total Protein 6.1 - 8.1 g/dL 6.7 7.0 7.2  Albumin 3.6 - 5.1 g/dL 4.3 4.1 4.2  AST 10 - 40 U/L  15 82(H) 17  ALT 9 - 46 U/L 14 34 26  Alk Phosphatase 40 - 115 U/L 96 74 67  Total Bilirubin 0.2 - 1.2 mg/dL 1.5(H) 1.6(H) 1.3(H)  Bilirubin, Direct 0.0 - 0.3 mg/dL - - 0.1    CBC Latest Ref Rng & Units 04/16/2015 01/17/2015 01/17/2015  WBC 4.0 - 10.5 K/uL 7.2 14.1(H) 15.0(H)  Hemoglobin 13.0 - 17.0 g/dL 15.1 14.6 14.5  Hematocrit 39.0 - 52.0 % 44.6 42.9 42.4  Platelets 150 - 400 K/uL 209 208 202   Lab Results  Component Value Date   MCV 87.8 04/16/2015   MCV 87.2 01/17/2015   MCV 86.4 01/17/2015    Lab Results  Component Value Date   TSH 0.36 (L) 04/16/2015    BNP    Component Value Date/Time   BNP 358.1 (H) 01/17/2015 0251   BNP <2.0 06/26/2010 1334    ProBNP    Component Value Date/Time   PROBNP <2.0 pg/mL 10/17/2009 1834     Lipid Panel     Component Value Date/Time   CHOL 107 (L) 04/16/2015 1028   TRIG 172 (H) 04/16/2015 1028   HDL 24 (L) 04/16/2015 1028   CHOLHDL 4.5 04/16/2015 1028   VLDL 34 (H) 04/16/2015 1028   LDLCALC 49 04/16/2015 1028   LDLDIRECT 99.9 09/15/2012 1738     RADIOLOGY: No results found.    ASSESSMENT AND PLAN: Cesar. Kenderick Kobler is a 50 year old white male who presented to [facility] hospital on the evening of 01/14/2015 with late presentation following an anterolateral MI which most likely occurred the day prior to his presentation.  In the emergency room, he was pain-free.  Cardiac catheterization revealed severe diffuse CAD with ulcerated plaque and thrombus and evidence for spontaneous distal LAD dissection.  He had a very difficult but successful PCI to his LAD, and ultimate insertion of 3 Synergy DES stents covering a total of 96 mm and extending from the ostium to the mid LAD.  He also had PTCA of his distal LAD dissection.  He has done  remarkably well since that time and has been participating in cardiac rehabilitation.  When I last saw him, I switched him to carvedilol and he developed some transient dizziness when he started at 6.25 mg but went was this was readjusted to 3.125 for several weeks and then upgraded to 6.25 mg twice a day.  He has tolerated this well.  His recent echo Doppler shows an EF of 40-45% and apical akinesis.  He has been able to tolerate titration of his carvedilol and is now on 12.50 g twice a day in addition to losartan 25 mg daily.  His blood pressure is on the lower side.  As result, I will not further titrate his losartan today.  He continues to be on dual antiplatelet therapy with aspirin and Brilinta.  He's not having recurrent chest pain.  There is no bleeding.  He's been treated with aggressive statin therapy with atorvastatin 80 mg a recent lipid studies revealed an excellent LDL cholesterol at 49.  His triglycerides were elevated and I have recommended initiation of omega-3 fatty acids.  I'm scheduling him for a follow-up nuclear perfusion study in December one year following his MI.  I will see him in January 2018 for reevaluation.  Time spent: 25 minutes  Cesar Sine, MD, [facility]  10/17/2015 6:35 PM

## 2015-10-22 NOTE — Addendum Note (Signed)
Addended by: Ulice Brilliant T on: 10/22/2015 04:40 PM   Modules accepted: Orders

## 2015-11-09 DIAGNOSIS — I249 Acute ischemic heart disease, unspecified: Secondary | ICD-10-CM

## 2015-11-09 HISTORY — DX: Acute ischemic heart disease, unspecified: I24.9

## 2015-11-21 ENCOUNTER — Emergency Department (HOSPITAL_COMMUNITY): Payer: BLUE CROSS/BLUE SHIELD

## 2015-11-21 ENCOUNTER — Observation Stay (HOSPITAL_COMMUNITY)
Admission: EM | Admit: 2015-11-21 | Discharge: 2015-11-22 | Disposition: A | Discharge status: Home / Self Care | Payer: BLUE CROSS/BLUE SHIELD | Attending: Cardiology | Admitting: Cardiology

## 2015-11-21 ENCOUNTER — Encounter (HOSPITAL_COMMUNITY): Payer: Self-pay | Admitting: *Deleted

## 2015-11-21 DIAGNOSIS — I10 Essential (primary) hypertension: Secondary | ICD-10-CM | POA: Insufficient documentation

## 2015-11-21 DIAGNOSIS — I2510 Atherosclerotic heart disease of native coronary artery without angina pectoris: Secondary | ICD-10-CM

## 2015-11-21 DIAGNOSIS — Z8547 Personal history of malignant neoplasm of testis: Secondary | ICD-10-CM | POA: Diagnosis not present

## 2015-11-21 DIAGNOSIS — Z87891 Personal history of nicotine dependence: Secondary | ICD-10-CM | POA: Insufficient documentation

## 2015-11-21 DIAGNOSIS — K219 Gastro-esophageal reflux disease without esophagitis: Secondary | ICD-10-CM | POA: Insufficient documentation

## 2015-11-21 DIAGNOSIS — I252 Old myocardial infarction: Secondary | ICD-10-CM | POA: Diagnosis not present

## 2015-11-21 DIAGNOSIS — Z79899 Other long term (current) drug therapy: Secondary | ICD-10-CM | POA: Diagnosis not present

## 2015-11-21 DIAGNOSIS — Z7982 Long term (current) use of aspirin: Secondary | ICD-10-CM | POA: Diagnosis not present

## 2015-11-21 DIAGNOSIS — E785 Hyperlipidemia, unspecified: Secondary | ICD-10-CM | POA: Insufficient documentation

## 2015-11-21 DIAGNOSIS — Z9079 Acquired absence of other genital organ(s): Secondary | ICD-10-CM | POA: Diagnosis not present

## 2015-11-21 DIAGNOSIS — Z8249 Family history of ischemic heart disease and other diseases of the circulatory system: Secondary | ICD-10-CM | POA: Diagnosis not present

## 2015-11-21 DIAGNOSIS — R079 Chest pain, unspecified: Secondary | ICD-10-CM | POA: Diagnosis not present

## 2015-11-21 DIAGNOSIS — Z955 Presence of coronary angioplasty implant and graft: Secondary | ICD-10-CM | POA: Diagnosis not present

## 2015-11-21 DIAGNOSIS — I255 Ischemic cardiomyopathy: Secondary | ICD-10-CM | POA: Insufficient documentation

## 2015-11-21 DIAGNOSIS — Z923 Personal history of irradiation: Secondary | ICD-10-CM | POA: Insufficient documentation

## 2015-11-21 DIAGNOSIS — Z9861 Coronary angioplasty status: Secondary | ICD-10-CM

## 2015-11-21 HISTORY — DX: Atherosclerotic heart disease of native coronary artery without angina pectoris: I25.10

## 2015-11-21 HISTORY — DX: Acute ischemic heart disease, unspecified: I24.9

## 2015-11-21 HISTORY — DX: Acute myocardial infarction, unspecified: I21.9

## 2015-11-21 LAB — CBC
HEMATOCRIT: 40.8 % (ref 39.0–52.0)
HEMATOCRIT: 39.3 % (ref 39.0–52.0)
HEMOGLOBIN: 13.1 g/dL (ref 13.0–17.0)
Hemoglobin: 13.8 g/dL (ref 13.0–17.0)
MCH: 29.7 pg (ref 26.0–34.0)
MCH: 29.3 pg (ref 26.0–34.0)
MCHC: 33.8 g/dL (ref 30.0–36.0)
MCHC: 33.3 g/dL (ref 30.0–36.0)
MCV: 87.7 fL (ref 78.0–100.0)
MCV: 87.9 fL (ref 78.0–100.0)
Platelets: 146 10*3/uL — ABNORMAL LOW (ref 150–400)
Platelets: 146 10*3/uL — ABNORMAL LOW (ref 150–400)
RBC: 4.65 MIL/uL (ref 4.22–5.81)
RBC: 4.47 MIL/uL (ref 4.22–5.81)
RDW: 12.8 % (ref 11.5–15.5)
RDW: 12.7 % (ref 11.5–15.5)
WBC: 8.4 10*3/uL (ref 4.0–10.5)
WBC: 8.3 10*3/uL (ref 4.0–10.5)

## 2015-11-21 LAB — BASIC METABOLIC PANEL
ANION GAP: 6 (ref 5–15)
BUN: 13 mg/dL (ref 6–20)
CO2: 27 mmol/L (ref 22–32)
Calcium: 9.1 mg/dL (ref 8.9–10.3)
Chloride: 107 mmol/L (ref 101–111)
Creatinine, Ser: 0.85 mg/dL (ref 0.61–1.24)
Glucose, Bld: 119 mg/dL — ABNORMAL HIGH (ref 65–99)
POTASSIUM: 4.2 mmol/L (ref 3.5–5.1)
SODIUM: 140 mmol/L (ref 135–145)

## 2015-11-21 LAB — TROPONIN I

## 2015-11-21 LAB — CREATININE, SERUM
CREATININE: 0.97 mg/dL (ref 0.61–1.24)
GFR calc Af Amer: >60 mL/min (ref >60)
GFR calc non Af Amer: >60 mL/min (ref >60)

## 2015-11-21 LAB — I-STAT TROPONIN, ED: Troponin i, poc: 0 ng/mL (ref 0.00–0.08)

## 2015-11-21 MED ORDER — HEPARIN SODIUM (PORCINE) 5000 UNIT/ML IJ SOLN
5000.0000 [IU] | Freq: Three times a day (TID) | INTRAMUSCULAR | Status: DC
  Administered 2015-11-21 – 2015-11-22 (×3): 5000 [IU] via SUBCUTANEOUS
  Filled 2015-11-21 (×4): qty 1

## 2015-11-21 MED ORDER — NITROGLYCERIN 0.4 MG SL SUBL: 0.4000 mg | SUBLINGUAL_TABLET | SUBLINGUAL | Status: DC | PRN

## 2015-11-21 MED ORDER — TICAGRELOR 90 MG PO TABS
90.0000 mg | ORAL_TABLET | Freq: Two times a day (BID) | ORAL | Status: DC
Start: 2015-11-21 — End: 2015-11-22
  Administered 2015-11-21 – 2015-11-22 (×2): 90 mg via ORAL
  Filled 2015-11-21 (×2): qty 1

## 2015-11-21 MED ORDER — OMEGA-3-ACID ETHYL ESTERS 1 G PO CAPS
1.0000 g | ORAL_CAPSULE | Freq: Every day | ORAL | Status: DC
  Administered 2015-11-22: 1 g via ORAL
  Filled 2015-11-21: qty 1

## 2015-11-21 MED ORDER — LOSARTAN POTASSIUM 25 MG PO TABS
25.0000 mg | ORAL_TABLET | Freq: Every day | ORAL | Status: DC
  Administered 2015-11-21: 25 mg via ORAL
  Filled 2015-11-21: qty 1

## 2015-11-21 MED ORDER — GI COCKTAIL ~~LOC~~
30.0000 mL | Freq: Once | ORAL | Status: AC
  Administered 2015-11-21: 30 mL via ORAL
  Filled 2015-11-21: qty 30

## 2015-11-21 MED ORDER — ONDANSETRON HCL 4 MG/2ML IJ SOLN: 4.0000 mg | Freq: Four times a day (QID) | INTRAMUSCULAR | Status: DC | PRN

## 2015-11-21 MED ORDER — ATORVASTATIN CALCIUM 80 MG PO TABS
80.0000 mg | ORAL_TABLET | Freq: Every day | ORAL | Status: DC
  Administered 2015-11-21: 80 mg via ORAL
  Filled 2015-11-21: qty 1

## 2015-11-21 MED ORDER — NITROGLYCERIN 2 % TD OINT
1.0000 [in_us] | TOPICAL_OINTMENT | Freq: Once | TRANSDERMAL | Status: AC
  Administered 2015-11-21: 1 [in_us] via TOPICAL
  Filled 2015-11-21: qty 1

## 2015-11-21 MED ORDER — MAGNESIUM 30 MG PO TABS: 30.0000 mg | ORAL_TABLET | Freq: Every day | ORAL | Status: DC

## 2015-11-21 MED ORDER — CARVEDILOL 12.5 MG PO TABS
12.5000 mg | ORAL_TABLET | Freq: Two times a day (BID) | ORAL | Status: DC
  Administered 2015-11-21 – 2015-11-22 (×2): 12.5 mg via ORAL
  Filled 2015-11-21 (×3): qty 1

## 2015-11-21 MED ORDER — ASPIRIN EC 81 MG PO TBEC
81.0000 mg | DELAYED_RELEASE_TABLET | Freq: Every day | ORAL | Status: DC
  Administered 2015-11-22: 81 mg via ORAL
  Filled 2015-11-21: qty 1

## 2015-11-21 MED ORDER — ACETAMINOPHEN 325 MG PO TABS
650.0000 mg | ORAL_TABLET | ORAL | Status: DC | PRN
  Administered 2015-11-21: 650 mg via ORAL
  Filled 2015-11-21: qty 2

## 2015-11-21 NOTE — ED Triage Notes (Signed)
Per EMS, pt transported from his doctor's office, pt woke up with mid-cp radiating down to his abd.  Pt reports mild nausea but denies any vomiting, SOB or dizziness at this time.  Pt has hx of MI about a year ago.

## 2015-11-21 NOTE — ED Notes (Signed)
Tray ordered for pt, pt told he must be NPO after midnight

## 2015-11-21 NOTE — Progress Notes (Signed)
Patient arrived to 2w10 from the ED in no apparent distress.  Telemetry monitor was applied and CCMD notified.  Will continue to monitor

## 2015-11-21 NOTE — ED Notes (Addendum)
Pt had total of 2 NTG via doctor's office and EMS. Hx of stent placement 1 yr ago

## 2015-11-21 NOTE — H&P (Signed)
Cardiology H&P    Patient ID: Cesar Woods MRN: [id number], DOB/AGE: [birth year]   Admit date: 11/21/2015 Date of Consult: 11/21/2015  Primary Physician: Nilda Simmer, MD Primary Cardiologist: Dr. Ellouise Newer   Patient Profile    Cesar Woods is a 50 year old male with a past medical history of CAD s/p STEMI in Dec. 2016 with DES x 4 to his LAD, HTN, HLD, and testicular cancer in remission.   History of Present Illness    Cesar Woods woke up this morning with chest pain and abdominal pain. He had some mild nausea associated with is pain, but denied any SOB or diaphoresis. In December of 2016 he had a  STEMI, at that time his angina was chest pain and pressure associated with abdominal pain, consistent with today's pain only less in severity.  He called his PCP and was able to be seen immediately. While in the PCP's office he continued to have chest pain and abdominal pain so he was given one SL Nitro and EMS was called. In the EMS truck he got an additional SL Nitro and his pain was almost completely resolved. Once he arrived to the ED, he continued to have slight chest pain and Nitro paste was applied. His pain resolved with the nitro paste. Total time of chest pain was about 3-4 hours.   His EKG showed NSR with lateral Q waves. His initial troponin was negative. He has been chest pain free since admission.   He is on ASA and Brilinta and has not missed any doses, he sets an alarm on his phone to take it twice a day and is very prudent about remaining compliant.   He is not a smoker, denies ETOH use. He did attend a concert last night and ate chili cheese fries and stayed out later than normal. He says it is usual for him to eat fatty foods.   Past Medical History   Past Medical History:  Diagnosis Date  . Atypical chest pain    Negative Myoview 2010  . Coronary artery disease   . Essential hypertension   . Food allergy    Anaphylaxis with shell fish  . GERD (gastroesophageal  reflux disease)   . History of migraines   . Hyperlipidemia   . Testicle cancer ([place])    In remission - followed by Dr. Risa Grill; had bilateral orchiectomy and XRT in past    Past Surgical History:  Procedure Laterality Date  . CARDIAC CATHETERIZATION N/A 01/15/2015   Procedure: Left Heart Cath and Coronary Angiography;  Surgeon: Troy Sine, MD;  Location: [facility];  Service: Cardiovascular;  Laterality: N/A;  . CARDIAC CATHETERIZATION  01/15/2015   Procedure: Coronary Stent Intervention;  Surgeon: Troy Sine, MD;  Location: [facility];  Service: Cardiovascular;;  . ORCHIECTOMY Bilateral      Allergies  Allergies  Allergen Reactions  . Shellfish Allergy Anaphylaxis  . Iodine     REACTION: Reaction not known  . Penicillins Other (See Comments)    unknown  . Ace Inhibitors Cough    Inpatient Medications      Family History    Family History  Problem Relation Age of Onset  . Heart attack Father     CABG at agae 27  . Stroke Paternal Grandfather   . Hypertension    . Hyperlipidemia    . Heart attack Maternal Grandmother     Social History    Social History  Social History  . Marital status: Married    Spouse name: N/A  . Number of children: N/A  . Years of education: N/A   Occupational History  . Not on file.   Social History Main Topics  . Smoking status: Former Research scientist (life sciences)  . Smokeless tobacco: Never Used     Comment: quit around 2010  . Alcohol use 0.0 oz/week     Comment: occasionally  . Drug use: No     Comment: remote use of marijuana, quit a long time ago  . Sexual activity: Not on file   Other Topics Concern  . 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  Cardiovascular:  +chest pain, dyspnea on exertion, edema, orthopnea,  palpitations, paroxysmal nocturnal dyspnea. Dermatological: No rash, lesions/masses Respiratory: No cough, dyspnea Urologic: No hematuria, dysuria Abdominal:   No nausea, vomiting, diarrhea, bright red blood per rectum, melena, or hematemesis Neurologic:  No visual changes, wkns, changes in mental status. All other systems reviewed and are otherwise negative except as noted above.  Physical Exam    Blood pressure 110/71, pulse 80, resp. rate 12, height 6' (1.829 m), weight 200 lb (90.7 kg), SpO2 97 %.  General: Pleasant, NAD Psych: Normal affect. Neuro: Alert and oriented X 3. Moves all extremities spontaneously. CN II-XII grossly intact HEENT: Normal  Neck: Supple without bruits or JVD. Lungs:  Resp regular and unlabored, CTA. Heart: RRR no s3, s4, or murmurs. Abdomen: Soft, non-tender, non-distended, BS + x 4.  Extremities: No clubbing, cyanosis or edema. DP/PT/Radials 2+ and equal bilaterally.  Labs    Troponin [facility] Test)  Recent Labs  11/21/15 1022  TROPIPOC 0.00    Lab Results  Component Value Date   WBC 8.4 11/21/2015   HGB 13.8 11/21/2015   HCT 40.8 11/21/2015   MCV 87.7 11/21/2015   PLT 146 (L) 11/21/2015    Recent Labs Lab 11/21/15 1018  NA 140  K 4.2  CL 107  CO2 27  BUN 13  CREATININE 0.85  CALCIUM 9.1  GLUCOSE 119*   Lab Results  Component Value Date   CHOL 107 (L) 04/16/2015   HDL 24 (L) 04/16/2015   LDLCALC 49 04/16/2015   TRIG 172 (H) 04/16/2015   Lab Results  Component Value Date   DDIMER 0.37 06/26/2010     Radiology Studies    Dg Chest 2 View  Result Date: 11/21/2015 CLINICAL DATA:  Chest pain today. EXAM: CHEST  2 VIEW COMPARISON:  PA and lateral chest 01/23/2015. FINDINGS: The lungs are clear. Heart size normal. No pneumothorax or pleural effusion. No focal bony abnormality. IMPRESSION: Negative chest. Electronically Signed   By: Inge Rise M.D.   On: 11/21/2015 12:10    EKG & Cardiac Imaging    EKG: NSR,  lateral q waves.   Echocardiogram: April 2017 Study Conclusions  - Left ventricle: The cavity size was normal. Wall thickness was   normal. Systolic function was mildly to moderately reduced. The   estimated ejection fraction was in the range of 40% to 45%. There   is akinesis of the apical myocardium. Doppler parameters are   consistent with abnormal left ventricular relaxation (grade  1   diastolic dysfunction). - Tricuspid valve: There was trivial regurgitation.  Impressions:  - Compared to the prior study, there has been no significant   interval change.   Assessment & Plan    1. Chest pain with high risk for cardiac etiology/History of CAD with STEMI: Presents with angina similar to his STEMI but much less in severity. His pain was relieve after 2 SL Nitro and the administration of Nitro paste. He has been compliant with his medications and has not missed any doses of Brilinta since his STEMI in Dec. 2016. He has 96 mm of stent in his LAD. Likely that given his duration of pain and no EKG changes and negative troponin that he does not have any restenosis of his stent. There was minimal disease in his RCA last year, 30% and 40% in his ramus.  However, symptoms are concerning. He will need nuclear stress test in the am.   2. HTN: Well controlled with Coreg and ARB. Continue same.   3. HLD: LDL is at goal on high intensity statin, will continue same.   4. Ischemic cardiomyopathy: EF was 40-45% by Echo 6 months ago. Continue ARB. Does not appear volume overloaded or have signs of CHF.     Signed, Arbutus Leas, NP 11/21/2015, 2:04 PM Pager: [phone]   I have seen, examined and evaluated the patient this PM along with Jettie Booze, NP.  After reviewing all the available data and chart, we discussed the patients laboratory, study & physical findings as well as symptoms in detail. I agree with her findings, examination as well as impression recommendations as per our  discussion.    Very pleasant young man with h/o Anterior MI with extensive LAD PCI las Dec, who has been doing very well since his MI & PCI.  F/u Echo in 05/2015 showed EF 40 - 45%; apical akinesis c/w prior MI; no change.  He was doing well until this AM - presents with epigastric pain radiating up into the chest -- Sx similar to MI pain.  Cesar Woods is that he admits to dietary indiscretion last PM - chili fries & onions.  Notes Sx are also similar to GERD Sx.  Exam above performed by me.   Sx are somewhat atypical for angina, but enough similar to his prior MI pain that I feel it prudent to admits under Obs & r/o MI.  If R/o - would check Myoview ([name] had planned to check on in December for f/u - this would be for Sx & more appropriate).  No CHF Sx.    [name], M.D., M.S. Interventional Cardiologist   Pager # [phone] Phone # [phone] [address]. [place],  [id number]

## 2015-11-21 NOTE — ED Provider Notes (Signed)
[facility] DEPT Provider Note   CSN: [id number] Arrival date & time: 11/21/15  [id number]     History   Chief Complaint Chief Complaint  Patient presents with  . Chest Pain    HPI Cesar Woods is a 50 y.o. male.  The history is provided by the patient and the spouse.  Chest Pain   This is a new problem. The current episode started 3 to 5 hours ago. The problem occurs constantly. The problem has not changed since onset.The pain is associated with exertion. The pain is present in the substernal region. The pain is mild. The quality of the pain is described as heavy and pressure-like. The pain radiates to the epigastrium. Associated symptoms include nausea. Pertinent negatives include no back pain, no shortness of breath and no vomiting. He has tried nitroglycerin for the symptoms. The treatment provided moderate relief.  His past medical history is significant for CAD, hyperlipidemia, hypertension and MI.    Past Medical History:  Diagnosis Date  . Atypical chest pain    Negative Myoview 2010  . Coronary artery disease   . Essential hypertension   . Food allergy    Anaphylaxis with shell fish  . GERD (gastroesophageal reflux disease)   . History of migraines   . Hyperlipidemia   . Testicle cancer ([place])    In remission - followed by Dr. Risa Grill; had bilateral orchiectomy and XRT in past    Patient Active Problem List   Diagnosis Date Noted  . Hyperlipidemia LDL goal <70 06/25/2015  . Acute MI, anterolateral wall, subsequent episode of care ([place]) 04/20/2015  . Coronary artery disease due to lipid rich plaque 01/24/2015  . Acute systolic congestive heart failure, NYHA class 2 ([place]) 01/16/2015  . Acute MI anterior wall first episode care [facility]) - 01/15/2015  . Acute coronary syndrome ([place])   . ST elevation myocardial infarction (STEMI) of anterolateral wall ([place]) -- Delayed presentation, pain free on admission 01/14/2015  . Skin lesion 04/07/2012  . GOITER, MULTINODULAR  03/09/2010  . THYROID NODULE, RIGHT 03/06/2010  . DYSPHAGIA UNSPECIFIED 12/22/2009  . TESTICULAR CANCER 10/17/2009  . SHORTNESS OF BREATH 10/17/2009  . ALLERGY, FOOD 03/07/2009  . SKIN TAG 03/07/2009  . PALPITATIONS 08/21/2008  . ALLERGIC RHINITIS 08/12/2008  . COLONIC POLYPS, HX OF 08/12/2008  . Dyslipidemia 07/29/2008  . Hypertensive heart disease 07/29/2008  . GERD 07/29/2008  . CHEST PAIN, ATYPICAL 07/29/2008    Past Surgical History:  Procedure Laterality Date  . CARDIAC CATHETERIZATION N/A 01/15/2015   Procedure: Left Heart Cath and Coronary Angiography;  Surgeon: Troy Sine, MD;  Location: [facility];  Service: Cardiovascular;  Laterality: N/A;  . CARDIAC CATHETERIZATION  01/15/2015   Procedure: Coronary Stent Intervention;  Surgeon: Troy Sine, MD;  Location: [facility];  Service: Cardiovascular;;  . ORCHIECTOMY Bilateral        Home Medications    Prior to Admission medications   Medication Sig Start Date End Date Taking? Authorizing Provider  aspirin EC 81 MG EC tablet Take 1 tablet (81 mg total) by mouth daily. 01/18/15  Yes Brett Canales, PA-C  atorvastatin (LIPITOR) 80 MG tablet Take 1 tablet (80 mg total) by mouth daily at 6 PM. 01/18/15  Yes Einar Pheasant Hager, PA-C  BRILINTA 90 MG TABS tablet TAKE 1 TABLET (90 MG TOTAL) BY MOUTH 2 (TWO) TIMES DAILY. 02/12/15  Yes Troy Sine, MD  carvedilol (COREG) 12.5 MG tablet Take 1 tablet (12.5 mg  total) by mouth 2 (two) times daily. 06/25/15  Yes Troy Sine, MD  EPIPEN 2-PAK 0.3 MG/0.3ML SOAJ injection INJECT 0.3 MLS INTO THE MUSCLE ONCE **PATIENT NEEDS TO SCHEDULE AN OFFICE VISIT** 06/12/13  Yes Doe-Hyun R Shawna Orleans, DO  losartan (COZAAR) 25 MG tablet Take 1 tablet (25 mg total) by mouth at bedtime. 08/26/15  Yes Troy Sine, MD  magnesium 30 MG tablet Take 30 mg by mouth daily.   Yes Historical Provider, MD  nitroGLYCERIN (NITROSTAT) 0.4 MG SL tablet Place 1 tablet (0.4 mg total) under the tongue every 5  (five) minutes x 3 doses as needed for chest pain. 01/18/15  Yes Brett Canales, PA-C  Omega-3 Fatty Acids (FISH OIL) 1000 MG CAPS Take 1 capsule by mouth daily.   Yes Historical Provider, MD  omeprazole (PRILOSEC) 20 MG capsule Take 20 mg by mouth daily.   Yes Historical Provider, MD  Testosterone (ANDROGEL) 40.5 MG/2.5GM (1.62%) GEL Place 5 Units onto the skin daily. 02/26/11  Yes Carlisle Cater, PA-C    Family History Family History  Problem Relation Age of Onset  . Heart attack Father     CABG at agae 22  . Stroke Paternal Grandfather   . Hypertension    . Hyperlipidemia    . Heart attack Maternal Grandmother     Social History Social History  Substance Use Topics  . Smoking status: Former Research scientist (life sciences)  . Smokeless tobacco: Never Used     Comment: quit around 2010  . Alcohol use 0.0 oz/week     Comment: occasionally     Allergies   Shellfish allergy; Iodine; Penicillins; and Ace inhibitors   Review of Systems Review of Systems  Respiratory: Negative for shortness of breath.   Cardiovascular: Positive for chest pain.  Gastrointestinal: Positive for nausea. Negative for vomiting.  Musculoskeletal: Negative for back pain.  All other systems reviewed and are negative.    Physical Exam Updated Vital Signs BP 114/80   Pulse 72   Resp 17   Ht 6' (1.829 m)   Wt 200 lb (90.7 kg)   SpO2 99%   BMI 27.12 kg/m   Physical Exam  Constitutional: He is oriented to person, place, and time. He appears well-developed and well-nourished. No distress.  HENT:  Head: Normocephalic and atraumatic.  Nose: Nose normal.  Eyes: Conjunctivae are normal.  Neck: Neck supple. No tracheal deviation present.  Cardiovascular: Normal rate, regular rhythm and normal heart sounds.   Pulmonary/Chest: Effort normal and breath sounds normal. No respiratory distress.  Abdominal: Soft. He exhibits no distension. There is no tenderness. There is no rebound and no guarding.  Neurological: He is alert and  oriented to person, place, and time.  Skin: Skin is warm and dry. Capillary refill takes less than 2 seconds.  Psychiatric: He has a normal mood and affect. His behavior is normal.     ED Treatments / Results  Labs (all labs ordered are listed, but only abnormal results are displayed) Labs Reviewed  BASIC METABOLIC PANEL - Abnormal; Notable for the following:       Result Value   Glucose, Bld 119 (*)    All other components within normal limits  CBC - Abnormal; Notable for the following:    Platelets 146 (*)    All other components within normal limits  CBC  CREATININE, SERUM  TROPONIN I  TROPONIN I  TROPONIN I  BASIC METABOLIC PANEL  CBC  PROTIME-INR  I-STAT TROPOININ, ED  EKG  EKG Interpretation  Date/Time:  Friday November 21 2015 10:00:12 EDT Ventricular Rate:  72 PR Interval:    QRS Duration: 98 QT Interval:  369 QTC Calculation: 404 R Axis:   -6 Text Interpretation:  Sinus rhythm Abnormal lateral Q waves Anterior infarct, old slight precordial biphasic morphology since prior Confirmed by Ankith Edmonston MD, Danon Lograsso [phone]) on 11/21/2015 12:22:46 PM       Radiology Dg Chest 2 View  Result Date: 11/21/2015 CLINICAL DATA:  Chest pain today. EXAM: CHEST  2 VIEW COMPARISON:  PA and lateral chest 01/23/2015. FINDINGS: The lungs are clear. Heart size normal. No pneumothorax or pleural effusion. No focal bony abnormality. IMPRESSION: Negative chest. Electronically Signed   By: Inge Rise M.D.   On: 11/21/2015 12:10    Procedures Procedures (including critical care time)  Medications Ordered in ED Medications  omega-3 acid ethyl esters (LOVAZA) capsule 1 g (not administered)  losartan (COZAAR) tablet 25 mg (not administered)  carvedilol (COREG) tablet 12.5 mg (12.5 mg Oral Given 11/21/15 1619)  ticagrelor (BRILINTA) tablet 90 mg (not administered)  aspirin EC tablet 81 mg (not administered)  atorvastatin (LIPITOR) tablet 80 mg (80 mg Oral Given 11/21/15 1620)    nitroGLYCERIN (NITROSTAT) SL tablet 0.4 mg (not administered)  acetaminophen (TYLENOL) tablet 650 mg (650 mg Oral Given 11/21/15 1619)  ondansetron (ZOFRAN) injection 4 mg (not administered)  heparin injection 5,000 Units (not administered)  nitroGLYCERIN (NITROGLYN) 2 % ointment 1 inch (1 inch Topical Given 11/21/15 1136)  gi cocktail (Maalox,Lidocaine,Donnatal) (30 mLs Oral Given 11/21/15 1344)     Initial Impression / Assessment and Plan / ED Course  I have reviewed the triage vital signs and the nursing notes.  Pertinent labs & imaging results that were available during my care of the patient were reviewed by me and considered in my medical decision making (see chart for details).  Clinical Course    50 y.o. male presents with chest pain and upper abdominal pan that was present upon waking this morning. No radiation but states feels similar to onset of prior MI. Continued to have pain when he went to PCP office today and was sent here with EMS, got 2x NTG with improvement of pain to 2/10, NTG paste applied. First troponin negative, has subtle changes from prior EKG which may be evolving from prior MI but cannot r/o ACS from ED. Discussed with cardiology who will admit Pt for provocative testing.   Final Clinical Impressions(s) / ED Diagnoses   Final diagnoses:  Chest pain with high risk for cardiac etiology    New Prescriptions New Prescriptions   No medications on file     Leo Grosser, MD 11/21/15 1722

## 2015-11-21 NOTE — ED Notes (Signed)
Report called to 2W by Sherlene Shams. Patient stable for transport at this time.  Will be taken to 2W on Telemetry.

## 2015-11-22 ENCOUNTER — Observation Stay (HOSPITAL_BASED_OUTPATIENT_CLINIC_OR_DEPARTMENT_OTHER): Payer: BLUE CROSS/BLUE SHIELD

## 2015-11-22 ENCOUNTER — Observation Stay (HOSPITAL_COMMUNITY): Payer: BLUE CROSS/BLUE SHIELD

## 2015-11-22 ENCOUNTER — Encounter (HOSPITAL_COMMUNITY): Payer: Self-pay | Admitting: Radiology

## 2015-11-22 DIAGNOSIS — R079 Chest pain, unspecified: Secondary | ICD-10-CM | POA: Diagnosis not present

## 2015-11-22 LAB — NM MYOCAR MULTI W/SPECT W/WALL MOTION / EF
CHL CUP MPHR: 171 {beats}/min
CHL CUP RESTING HR STRESS: 76 {beats}/min
CSEPEW: 1 METS
Exercise duration (min): 5 min
Peak HR: 112 {beats}/min
Percent HR: 65 %

## 2015-11-22 LAB — BASIC METABOLIC PANEL
Anion gap: 8 (ref 5–15)
BUN: 13 mg/dL (ref 6–20)
CHLORIDE: 107 mmol/L (ref 101–111)
CO2: 25 mmol/L (ref 22–32)
CREATININE: 0.81 mg/dL (ref 0.61–1.24)
Calcium: 8.7 mg/dL — ABNORMAL LOW (ref 8.9–10.3)
Glucose, Bld: 92 mg/dL (ref 65–99)
POTASSIUM: 3.8 mmol/L (ref 3.5–5.1)
SODIUM: 140 mmol/L (ref 135–145)

## 2015-11-22 LAB — CBC
HCT: 41.2 % (ref 39.0–52.0)
Hemoglobin: 13.8 g/dL (ref 13.0–17.0)
MCH: 29.4 pg (ref 26.0–34.0)
MCHC: 33.5 g/dL (ref 30.0–36.0)
MCV: 87.7 fL (ref 78.0–100.0)
PLATELETS: 145 10*3/uL — AB (ref 150–400)
RBC: 4.7 MIL/uL (ref 4.22–5.81)
RDW: 12.8 % (ref 11.5–15.5)
WBC: 8.6 10*3/uL (ref 4.0–10.5)

## 2015-11-22 LAB — PROTIME-INR
INR: 1.05
Prothrombin Time: 13.7 seconds (ref 11.4–15.2)

## 2015-11-22 LAB — TROPONIN I: Troponin I: <0.03 ng/mL (ref <0.03)

## 2015-11-22 MED ORDER — TECHNETIUM TC 99M TETROFOSMIN IV KIT
30.0000 | PACK | Freq: Once | INTRAVENOUS | Status: AC | PRN
  Administered 2015-11-22: 30 via INTRAVENOUS

## 2015-11-22 MED ORDER — REGADENOSON 0.4 MG/5ML IV SOLN
0.4000 mg | Freq: Once | INTRAVENOUS | Status: AC
  Administered 2015-11-22: 0.4 mg via INTRAVENOUS
  Filled 2015-11-22: qty 5

## 2015-11-22 MED ORDER — REGADENOSON 0.4 MG/5ML IV SOLN
INTRAVENOUS | Status: AC
  Filled 2015-11-22: qty 5

## 2015-11-22 MED ORDER — TECHNETIUM TC 99M TETROFOSMIN IV KIT
10.0000 | PACK | Freq: Once | INTRAVENOUS | Status: AC | PRN
  Administered 2015-11-22: 10 via INTRAVENOUS

## 2015-11-22 NOTE — Discharge Summary (Signed)
Discharge Summary    Patient ID: Cesar Woods,  MRN: [id number], DOB/AGE: 1965/11/13 50 y.o.  Admit date: 11/21/2015 Discharge date: 11/22/2015  Primary Care Provider: [facility] Primary Cardiologist: [name]  Discharge Diagnoses    Active Problems:   Chest pain with moderate risk for cardiac etiology   Allergies Allergies  Allergen Reactions  . Shellfish Allergy Anaphylaxis  . Iodine     REACTION: Reaction not known  . Penicillins Other (See Comments)    unknown  . Ace Inhibitors Cough    Diagnostic Studies/Procedures    Lexiscan Myoview: 10/14  FINDINGS: Perfusion: Large fixed defect involving the apex extending towards the mid ventricle on the anterior and lateral wall.  Small reversible defect involving the anterior septal wall on the margin of the scar.  Wall Motion: Normal left ventricular wall motion. No left ventricular dilation.  Left Ventricular Ejection Fraction: 43 %  End diastolic volume [id number] ml. Evidence of transient ischemic dilation on stress images.  End systolic volume 74 ml  IMPRESSION: 1. Large fixed defect involving the apex, extending towards the mid ventricle on anterior and lateral wall. There is a small reversible defect at the margin of the scar at the anterior septal wall. Evidence of transient ischemic dilation.  2. Normal left ventricular wall motion.  3. Left ventricular ejection fraction 43%.  4. Non invasive risk stratification*: High  *2012 Appropriate Use Criteria for Coronary Revascularization Focused Update: J Am Coll Cardiol. N6492421. http://content.airportbarriers.com.aspx?articleid=1201161   Electronically Signed   By: Corrie Mckusick D.O.   On: 11/22/2015 13:54 _____________   History of Present Illness     50 year old male with a past medical history of CAD s/p STEMI in Dec. 2016 with DES x 4 to his LAD, HTN, HLD, and testicular cancer in remission. Cesar Woods woke up on  the morning of 10/13 with chest pain and abdominal pain. He had some mild nausea associated with is pain, but denied any SOB or diaphoresis. In December of 2016 he had a  STEMI, at that time his angina was chest pain and pressure associated with abdominal pain, consistent with today's pain only less in severity.  He called his PCP and was able to be seen immediately. While in the PCP's office he continued to have chest pain and abdominal pain so he was given one SL Nitro and EMS was called. In the EMS truck he got an additional SL Nitro and his pain was almost completely resolved. Once he arrived to the ED, he continued to have slight chest pain and Nitro paste was applied. His pain resolved with the nitro paste. Total time of chest pain was about 3-4 hours.   His EKG showed NSR with lateral Q waves. His initial troponin was negative. He has been chest pain free since admission.   He is on ASA and Brilinta and has not missed any doses, he sets an alarm on his phone to take it twice a day and is very prudent about remaining compliant.   He is not a smoker, denies ETOH use. He did attend a concert last night and ate chili cheese fries and stayed out later than normal. He says it is usual for him to eat fatty foods.   Hospital Course     Consultants: none  He was admitted overnight with plans to undergo a a myoview the following day. Trops were neg x3. Morning labs were noted stable. He had a lexiscan myoview which read as  high risk noting a large anteroapical scar with minimal peri-infarct reversibilty noting his EF at 43%. All of which was consistent with his prior MI in 2016. It was felt that his symptoms were likely related to his dietary indiscretion the night prior to presenting to the ED.   He was seen by Dr. Martinique on 10/14 and determined stable for discharge home. No further reports of chest pain during this admission. He will be continued on his home medications without any noted changes.  I have sent a message to arrange for follow up in the office.  _____________  Discharge Vitals Blood pressure 121/75, pulse 60 , temperature 98 F (36.7 C), temperature source Oral, resp. rate 16, height 6' (1.829 m), weight 200 lb (90.7 kg), SpO2 96 %.  Filed Weights   11/21/15 1001  Weight: 200 lb (90.7 kg)    Labs & Radiologic Studies    CBC  Recent Labs  11/21/15 1708 11/22/15 0315  WBC 8.3 8.6  HGB 13.1 13.8  HCT 39.3 41.2  MCV 87.9 87.7  PLT 146* [id number]*   Basic Metabolic Panel  Recent Labs  11/21/15 1018 11/21/15 1708 11/22/15 0315  NA 140  --  140  K 4.2  --  3.8  CL 107  --  107  CO2 27  --  25  GLUCOSE 119*  --  92  BUN 13  --  13  CREATININE 0.85 0.97 0.81  CALCIUM 9.1  --  8.7*   Liver Function Tests No results for input(s): AST, ALT, ALKPHOS, BILITOT, PROT, ALBUMIN in the last 72 hours. No results for input(s): LIPASE, AMYLASE in the last 72 hours. Cardiac Enzymes  Recent Labs  11/21/15 1708 11/21/15 2228 11/22/15 0315  TROPONINI <0.03 <0.03 <0.03   BNP Invalid input(s): POCBNP D-Dimer No results for input(s): DDIMER in the last 72 hours. Hemoglobin A1C No results for input(s): HGBA1C in the last 72 hours. Fasting Lipid Panel No results for input(s): CHOL, HDL, LDLCALC, TRIG, CHOLHDL, LDLDIRECT in the last 72 hours. Thyroid Function Tests No results for input(s): TSH, T4TOTAL, T3FREE, THYROIDAB in the last 72 hours.  Invalid input(s): FREET3 _____________  Dg Chest 2 View  Result Date: 11/21/2015 CLINICAL DATA:  Chest pain today. EXAM: CHEST  2 VIEW COMPARISON:  PA and lateral chest 01/23/2015. FINDINGS: The lungs are clear. Heart size normal. No pneumothorax or pleural effusion. No focal bony abnormality. IMPRESSION: Negative chest. Electronically Signed   By: Inge Rise M.D.   On: 11/21/2015 12:10   Nm Myocar Multi W/spect W/wall Motion / Ef  Result Date: 11/22/2015 CLINICAL DATA:  50 year old male with a history of chest  pain. Cardiovascular risk factors include known coronary artery disease with prior MI, smoking, hypertension, family history, hyperlipidemia EXAM: MYOCARDIAL IMAGING WITH SPECT (REST AND PHARMACOLOGIC-STRESS) GATED LEFT VENTRICULAR WALL MOTION STUDY LEFT VENTRICULAR EJECTION FRACTION TECHNIQUE: Standard myocardial SPECT imaging was performed after resting intravenous injection of 10 mCi Tc-55m tetrofosmin. Subsequently, intravenous infusion of Lexiscan was performed under the supervision of the Cardiology staff. At peak effect of the drug, 30 mCi Tc-71m tetrofosmin was injected intravenously and standard myocardial SPECT imaging was performed. Quantitative gated imaging was also performed to evaluate left ventricular wall motion, and estimate left ventricular ejection fraction. COMPARISON:  None. FINDINGS: Perfusion: Large fixed defect involving the apex extending towards the mid ventricle on the anterior and lateral wall. Small reversible defect involving the anterior septal wall on the margin of the scar. Wall Motion: Normal left ventricular  wall motion. No left ventricular dilation. Left Ventricular Ejection Fraction: 43 % End diastolic volume [id number] ml. Evidence of transient ischemic dilation on stress images. End systolic volume 74 ml IMPRESSION: 1. Large fixed defect involving the apex, extending towards the mid ventricle on anterior and lateral wall. There is a small reversible defect at the margin of the scar at the anterior septal wall. Evidence of transient ischemic dilation. 2. Normal left ventricular wall motion. 3. Left ventricular ejection fraction 43%. 4. Non invasive risk stratification*: High *2012 Appropriate Use Criteria for Coronary Revascularization Focused Update: J Am Coll Cardiol. N6492421. http://content.airportbarriers.com.aspx?articleid=1201161 Electronically Signed   By: Corrie Mckusick D.O.   On: 11/22/2015 13:54   Disposition   Pt is being discharged home today in good  condition.  Follow-up Plans & Appointments    Follow-up Information    [name], MD .   Specialty:  Cardiology Why:  The office will call you with an appt within 2-3 days. Please given [place] a call if you have not received a cakk within that time frame.   Contact information: [address] [place] [address] [phone]          Discharge Instructions    Diet - low sodium heart healthy    Complete by:  As directed    Increase activity slowly    Complete by:  As directed       Discharge Medications   Current Discharge Medication List    CONTINUE these medications which have NOT CHANGED   Details  aspirin EC 81 MG EC tablet Take 1 tablet (81 mg total) by mouth daily.    atorvastatin (LIPITOR) 80 MG tablet Take 1 tablet (80 mg total) by mouth daily at 6 PM. Qty: 30 tablet, Refills: 11    BRILINTA 90 MG TABS tablet TAKE 1 TABLET (90 MG TOTAL) BY MOUTH 2 (TWO) TIMES DAILY. Qty: 60 tablet, Refills: 2    carvedilol (COREG) 12.5 MG tablet Take 1 tablet (12.5 mg total) by mouth 2 (two) times daily. Qty: 60 tablet, Refills: 6    EPIPEN 2-PAK 0.3 MG/0.3ML SOAJ injection INJECT 0.3 MLS INTO THE MUSCLE ONCE **PATIENT NEEDS TO SCHEDULE AN OFFICE VISIT** Qty: 2 Device, Refills: 0    losartan (COZAAR) 25 MG tablet Take 1 tablet (25 mg total) by mouth at bedtime. Qty: 90 tablet, Refills: 3    magnesium 30 MG tablet Take 30 mg by mouth daily.    nitroGLYCERIN (NITROSTAT) 0.4 MG SL tablet Place 1 tablet (0.4 mg total) under the tongue every 5 (five) minutes x 3 doses as needed for chest pain. Qty: 25 tablet, Refills: 12    Omega-3 Fatty Acids (FISH OIL) 1000 MG CAPS Take 1 capsule by mouth daily.    omeprazole (PRILOSEC) 20 MG capsule Take 20 mg by mouth daily.    Testosterone (ANDROGEL) 40.5 MG/2.5GM (1.62%) GEL Place 5 Units onto the skin daily. Qty: 225 g, Refills: 0         Aspirin prescribed at discharge?  Yes High Intensity Statin Prescribed?  (Lipitor 40-80mg  or Crestor 20-40mg ): Yes Beta Blocker Prescribed? Yes For EF <40%, was ACEI/ARB Prescribed? No: EF ok ADP Receptor Inhibitor Prescribed? (i.e. Plavix etc.-Includes Medically Managed Patients): Yes For EF <40%, Aldosterone Inhibitor Prescribed? No: EF ok Was EF assessed during THIS hospitalization? Yes Was Cardiac Rehab II ordered? (Included Medically managed Patients): No:    Outstanding Labs/Studies   None  Duration of Discharge Encounter   Greater than 30  minutes including physician time.  Signed, Reino Bellis NP-C 11/22/2015, 4:38 PM

## 2015-11-22 NOTE — Progress Notes (Signed)
Patient Name: Cesar Woods Date of Encounter: 11/22/2015  Primary Cardiologist: Dr. Clearnce Hasten Problem List     Active Problems:   Chest pain with moderate risk for cardiac etiology     Subjective   Feeling well, no chest pain.   Inpatient Medications    Scheduled Meds: . regadenoson      . aspirin EC  81 mg Oral Daily  . atorvastatin  80 mg Oral q1800  . carvedilol  12.5 mg Oral BID WC  . heparin  5,000 Units Subcutaneous Q8H  . losartan  25 mg Oral QHS  . omega-3 acid ethyl esters  1 g Oral Daily  . ticagrelor  90 mg Oral BID   Continuous Infusions:   PRN Meds: acetaminophen, nitroGLYCERIN, ondansetron (ZOFRAN) IV   Vital Signs    Vitals:   11/22/15 0743 11/22/15 0900 11/22/15 0934 11/22/15 0936  BP: 122/81 122/81 (!) 136/100 121/90  Pulse: 76 79 (!) 112 (!) 107  Resp:  20 (!) 24 (!) 22  Temp:      TempSrc:      SpO2:      Weight:      Height:       No intake or output data in the 24 hours ending 11/22/15 0937 Filed Weights   11/21/15 1001  Weight: 200 lb (90.7 kg)    Physical Exam   GEN: Well nourished, well developed, in no acute distress.  HEENT: Grossly normal.  Neck: Supple, no JVD, carotid bruits, or masses. Cardiac: RRR, no murmurs, rubs, or gallops. No clubbing, cyanosis, edema.  Radials/DP/PT 2+ and equal bilaterally.  Respiratory:  Respirations regular and unlabored, clear to auscultation bilaterally. GI: Soft, nontender, nondistended, BS + x 4. MS: no deformity or atrophy. Skin: warm and dry, no rash. Neuro:  Strength and sensation are intact. Psych: AAOx3.  Normal affect.  Labs    CBC  Recent Labs  11/21/15 1708 11/22/15 0315  WBC 8.3 8.6  HGB 13.1 13.8  HCT 39.3 41.2  MCV 87.9 87.7  PLT 146* [id number]*   Basic Metabolic Panel  Recent Labs  11/21/15 1018 11/21/15 1708 11/22/15 0315  NA 140  --  140  K 4.2  --  3.8  CL 107  --  107  CO2 27  --  25  GLUCOSE 119*  --  92  BUN 13  --  13  CREATININE 0.85 0.97  0.81  CALCIUM 9.1  --  8.7*   Liver Function Tests No results for input(s): AST, ALT, ALKPHOS, BILITOT, PROT, ALBUMIN in the last 72 hours. No results for input(s): LIPASE, AMYLASE in the last 72 hours. Cardiac Enzymes  Recent Labs  11/21/15 1708 11/21/15 2228 11/22/15 0315  TROPONINI <0.03 <0.03 <0.03   BNP Invalid input(s): POCBNP D-Dimer No results for input(s): DDIMER in the last 72 hours. Hemoglobin A1C No results for input(s): HGBA1C in the last 72 hours. Fasting Lipid Panel No results for input(s): CHOL, HDL, LDLCALC, TRIG, CHOLHDL, LDLDIRECT in the last 72 hours. Thyroid Function Tests No results for input(s): TSH, T4TOTAL, T3FREE, THYROIDAB in the last 72 hours.  Invalid input(s): FREET3  Telemetry    SR - Personally Reviewed  ECG     SR- Personally Reviewed  Radiology    Dg Chest 2 View  Result Date: 11/21/2015 CLINICAL DATA:  Chest pain today. EXAM: CHEST  2 VIEW COMPARISON:  PA and lateral chest 01/23/2015. FINDINGS: The lungs are clear. Heart size normal. No pneumothorax or  pleural effusion. No focal bony abnormality. IMPRESSION: Negative chest. Electronically Signed   By: Inge Rise M.D.   On: 11/21/2015 12:10    Cardiac Studies   EKG: NSR, lateral q waves.   Echocardiogram: April 2017 Study Conclusions  - Left ventricle: The cavity size was normal. Wall thickness was normal. Systolic function was mildly to moderately reduced. The estimated ejection fraction was in the range of 40% to 45%. There is akinesis of the apical myocardium. Doppler parameters are consistent with abnormal left ventricular relaxation (grade 1 diastolic dysfunction). - Tricuspid valve: There was trivial regurgitation.  Impressions:  - Compared to the prior study, there has been no significant interval change.  Patient Profile     Cesar Woods is a 50 year old male with a past medical history of CAD s/p STEMI in Dec. 2016 with DES x 4 to his LAD,  HTN, HLD, and testicular cancer in remission.   Assessment & Plan    1. Chest pain with high risk for cardiac etiology/History of CAD with STEMI: Presents with angina similar to his STEMI but much less in severity. His pain was relieve after 2 SL Nitro and the administration of Nitro paste. He has been compliant with his medications and has not missed any doses of Brilinta since his STEMI in Dec. 2016. He has 96 mm of stent in his LAD. Likely that given his duration of pain and no EKG changes and negative troponin that he does not have any restenosis of his stent. There was minimal disease in his RCA last year, 30% and 40% in his ramus.  --Seen in [place] [facility] for lexiscan myoveiw. Images pending.  2. HTN: Well controlled with Coreg and ARB. Continue same.   3. HLD: LDL is at goal on high intensity statin, will continue same.   4. Ischemic cardiomyopathy: EF was 40-45% by Echo 6 months ago. Continue ARB. Does not appear volume overloaded or have signs of CHF.  Signed, Reino Bellis, NP  11/22/2015, 9:37 AM  Patient seen and examined and history reviewed. Agree with above findings and plan.  I personally reviewed Myoview study. There is a large anteroapical scar. Minimal peri-infarct reversibility. EF 43%. This is all consistent with his prior MI. EF is stable. Symptoms most likely related to dietary indiscretion. Will DC home today on prior meds. Follow up with [name].  Jaryah Aracena [place], [place] 11/22/2015 4:01 PM

## 2015-11-22 NOTE — Progress Notes (Signed)
Order received to discharge Pt.  Telemetry removed and CCMD notified.  IV removed with catheter intact.  Discharge education given to Pt with wife at bedside.  No medication changes made.  Pt counseled to follow low sodium diet.  Pt indicates understanding.  Pt denies chest pain at this time.  Pt stable to discharge.

## 2015-12-10 ENCOUNTER — Ambulatory Visit (INDEPENDENT_AMBULATORY_CARE_PROVIDER_SITE_OTHER): Payer: BLUE CROSS/BLUE SHIELD | Admitting: Student

## 2015-12-10 ENCOUNTER — Encounter: Payer: Self-pay | Admitting: Student

## 2015-12-10 ENCOUNTER — Other Ambulatory Visit: Payer: Self-pay | Admitting: Student

## 2015-12-10 VITALS — BP 126/78 | HR 73 | Ht 72.0 in | Wt 203.6 lb

## 2015-12-10 DIAGNOSIS — E782 Mixed hyperlipidemia: Secondary | ICD-10-CM | POA: Diagnosis not present

## 2015-12-10 DIAGNOSIS — I2510 Atherosclerotic heart disease of native coronary artery without angina pectoris: Secondary | ICD-10-CM | POA: Diagnosis not present

## 2015-12-10 DIAGNOSIS — I255 Ischemic cardiomyopathy: Secondary | ICD-10-CM | POA: Diagnosis not present

## 2015-12-10 DIAGNOSIS — I10 Essential (primary) hypertension: Secondary | ICD-10-CM

## 2015-12-10 NOTE — Patient Instructions (Signed)
Medication Instructions: Your physician recommends that you continue on your current medications as directed. Please refer to the Current Medication list given to you today.   Follow-Up: Follow-up with [name] in January 2018.  If you need a refill on your cardiac medications before your next appointment, please call your pharmacy.

## 2015-12-10 NOTE — Progress Notes (Signed)
Cardiology Office Note    Date:  12/10/2015   ID:  Cesar Woods, DOB Oct 09, 1965, MRN [id number]  PCP:  Nilda Simmer, MD  Cardiologist: [name]  Chief Complaint  Patient presents with  . Follow-up    2 week hospital follow-up; No current symptoms.    History of Present Illness:    Cesar Woods is a 50 y.o. male with past medical history of CAD (s/p STEMI in 01/2015 requiring placement of 4 DES to LAD), ischemic cardiomyopathy (EF 40-45% by echo in 05/2015), HTN, HLD, and testicular cancer (currently in remission) who presents to the office today for hospital follow-up.  Was recently admitted from 11/21/2015  - 11/22/2015 for evaluation of chest pain. Reported developing chest and abdominal discomfort earlier that morning, similar to what he experienced with his MI in 2016. However, the pain persisted for 3-4 hours and he did note some dietary indiscretions, consuming a hot dog and chili-cheese fries the night prior to symptom onset which was atypical for his current diet plan. EKG showed no acute ischemic changes and cyclic troponin values remained negative. A NST was performed which showed a large fixed defect involving the apex, extending towards the mid ventricle on anterior and lateral wall with minimal peri-infarct reversibility and EF at 43%. This was all thought to be consistent with his prior MI and with resolved symptoms, no further ischemic evaluation was anticipated.  Today, he reports doing well since his recent hospitalization. Denies any repeat episodes of chest discomfort or dyspnea with exertion. No orthopnea, PND, or lower extremity edema. Has been exercising regularly with no anginal symptoms. Has improved compliance with his diet consuming more fruits, vegetables, salads, and lean meats.   Has been taking his current medications as instructed. Does not miss any doses of ASA or Brilinta. Had been instructed to take OTC Fish Oil but was unable to tolerate this due to  abdominal discomfort.    Past Medical History:  Diagnosis Date  . ACS (acute coronary syndrome) ([place]) 11/2015  . Atypical chest pain    Negative Myoview 2010  . Coronary artery disease    a.s/p STEMI in 01/2015 requiring placement of 4 DES to LAD. b. 11/2015: NST showing apical scar with minimal peri-infarct ischemia.  . Essential hypertension   . Food allergy    Anaphylaxis with shell fish  . GERD (gastroesophageal reflux disease)   . History of migraines   . Hyperlipidemia   . Ischemic cardiomyopathy    a. Echo 05/2015: EF 40-45%, apical akineiss with Grade 1 DD.  [name] Myocardial infarction 2016  . Testicle cancer ([place])    In remission - followed by Dr. Risa Grill; had bilateral orchiectomy and XRT in past    Past Surgical History:  Procedure Laterality Date  . CARDIAC CATHETERIZATION N/A 01/15/2015   Procedure: Left Heart Cath and Coronary Angiography;  Surgeon: Troy Sine, MD;  Location: [facility];  Service: Cardiovascular;  Laterality: N/A;  . CARDIAC CATHETERIZATION  01/15/2015   Procedure: Coronary Stent Intervention;  Surgeon: Troy Sine, MD;  Location: [facility];  Service: Cardiovascular;;  . ORCHIECTOMY Bilateral     Current Medications: Outpatient Medications Prior to Visit  Medication Sig Dispense Refill  . aspirin EC 81 MG EC tablet Take 1 tablet (81 mg total) by mouth daily.    [name] atorvastatin (LIPITOR) 80 MG tablet Take 1 tablet (80 mg total) by mouth daily at 6 PM. 30 tablet 11  . BRILINTA 90  MG TABS tablet TAKE 1 TABLET (90 MG TOTAL) BY MOUTH 2 (TWO) TIMES DAILY. 60 tablet 2  . carvedilol (COREG) 12.5 MG tablet Take 1 tablet (12.5 mg total) by mouth 2 (two) times daily. 60 tablet 6  . EPIPEN 2-PAK 0.3 MG/0.3ML SOAJ injection INJECT 0.3 MLS INTO THE MUSCLE ONCE **PATIENT NEEDS TO SCHEDULE AN OFFICE VISIT** 2 Device 0  . magnesium 30 MG tablet Take 30 mg by mouth daily.    . nitroGLYCERIN (NITROSTAT) 0.4 MG SL tablet Place 1 tablet (0.4 mg  total) under the tongue every 5 (five) minutes x 3 doses as needed for chest pain. 25 tablet 12  . Omega-3 Fatty Acids (FISH OIL) 1000 MG CAPS Take 1 capsule by mouth daily.    [name] losartan (COZAAR) 25 MG tablet Take 1 tablet (25 mg total) by mouth at bedtime. 90 tablet 3  . omeprazole (PRILOSEC) 20 MG capsule Take 20 mg by mouth daily.    . Testosterone (ANDROGEL) 40.5 MG/2.5GM (1.62%) GEL Place 5 Units onto the skin daily. 225 g 0   No facility-administered medications prior to visit.      Allergies:   Shellfish allergy; Iodine; Penicillins; and Ace inhibitors   Social History   Social History  . Marital status: Married    Spouse name: N/A  . Number of children: N/A  . Years of education: N/A   Social History Main Topics  . Smoking status: Former Research scientist (life sciences)  . Smokeless tobacco: Never Used     Comment: quit around 2010  . Alcohol use 0.0 oz/week     Comment: occasionally  . Drug use: No     Comment: remote use of marijuana, quit a long time ago  . Sexual activity: Not Asked   Other Topics Concern  . None   Social History Narrative   Occupation:Senior Trader for [employer]   Divorced    No children      Former smoker    Alcohol Use - yes           Family History:  The patient's family history includes Heart attack in his father and maternal grandmother; Stroke in his paternal grandfather.   Review of Systems:   Please see the history of present illness.     General:  No chills, fever, night sweats or weight changes.  Cardiovascular:  No chest pain, dyspnea on exertion, edema, orthopnea, palpitations, paroxysmal nocturnal dyspnea. Dermatological: No rash, lesions/masses Respiratory: No cough, dyspnea Urologic: No hematuria, dysuria Abdominal:   No nausea, vomiting, diarrhea, bright red blood per rectum, melena, or hematemesis Neurologic:  No visual changes, wkns, changes in mental status. All other systems reviewed and are otherwise negative except as noted  above.   Physical Exam:    VS:  BP 126/78   Pulse 73   Ht 6' (1.829 m)   Wt 203 lb 9.6 oz (92.4 kg)   BMI 27.61 kg/m    General: Well developed, well nourished,male appearing in no acute distress. Head: Normocephalic, atraumatic, sclera non-icteric, no xanthomas, nares are without discharge.  Neck: No carotid bruits. JVD not elevated.  Lungs: Respirations regular and unlabored, without wheezes or rales.  Heart: Regular rate and rhythm. No S3 or S4.  No murmur, no rubs, or gallops appreciated. Abdomen: Soft, non-tender, non-distended with normoactive bowel sounds. No hepatomegaly. No rebound/guarding. No obvious abdominal masses. Msk:  Strength and tone appear normal for age. No joint deformities or effusions. Extremities: No clubbing or cyanosis. No edema.  Distal pedal  pulses are 2+ bilaterally. Neuro: Alert and oriented X 3. Moves all extremities spontaneously. No focal deficits noted. Psych:  Responds to questions appropriately with a normal affect. Skin: No rashes or lesions noted  Wt Readings from Last 3 Encounters:  12/10/15 203 lb 9.6 oz (92.4 kg)  11/21/15 200 lb (90.7 kg)  10/15/15 201 lb 3.2 oz (91.3 kg)     Studies/Labs Reviewed:   EKG:  EKG is ordered today.  The ekg ordered today demonstrates NSR, HR 73, with up-sloping ST in V2 and V3 with TWI in lateral leads, similar to previous tracings.   Recent Labs: 01/17/2015: B Natriuretic Peptide 358.1 04/16/2015: ALT 14; TSH 0.36 11/22/2015: BUN 13; Creatinine, Ser 0.81; Hemoglobin 13.8; Platelets 145; Potassium 3.8; Sodium 140   Lipid Panel    Component Value Date/Time   CHOL 107 (L) 04/16/2015 1028   TRIG 172 (H) 04/16/2015 1028   HDL 24 (L) 04/16/2015 1028   CHOLHDL 4.5 04/16/2015 1028   VLDL 34 (H) 04/16/2015 1028   LDLCALC 49 04/16/2015 1028   LDLDIRECT 99.9 09/15/2012 1738    Additional studies/ records that were reviewed today include:   NST: 11/22/2015 IMPRESSION: 1. Large fixed defect involving the  apex, extending towards the mid ventricle on anterior and lateral wall. There is a small reversible defect at the margin of the scar at the anterior septal wall. Evidence of transient ischemic dilation.  2. Normal left ventricular wall motion.  3. Left ventricular ejection fraction 43%.  4. Non invasive risk stratification*: High  Assessment:    1. Coronary artery disease involving native coronary artery of native heart without angina pectoris   2. Cardiomyopathy, ischemic   3. Essential hypertension   4. Mixed hyperlipidemia      Plan:   In order of problems listed above:  1. CAD involving native coronary artery without angina pectoris - has known CAD, s/p STEMI in 01/2015 requiring placement of 4 DES to LAD. Recently admitted in 11/2015 for evaluation of chest pain. Cyclic troponin values remained negative and EKG was without any acute ischemic changes. NST was performed which showed a large fixed defect involving the apex, extending towards the mid ventricle on anterior and lateral wall with minimal peri-infarct reversibility and EF at 43%. This was all thought to be consistent with his prior MI and with resolved symptoms, no further ischemic evaluation was indicated. Presenting symptoms likely secondary to dietary indiscretion of consuming a hot dog and fries the night prior to onset of his symptoms.  - denies any recurrent symptoms since hospital discharge. EKG today is unchanged from previous tracings. - Continue ASA, Brilinta, BB, ARB, and statin therapy. Will cancel upcoming NST in 01/2016 as he just had this performed 2 weeks ago while inpatient.   2. Ischemic Cardiomyopathy - EF 40-45% by echo in 05/2015. - Does not appear volume overloaded on physical exam - continue BB and ARB.   3. HTN - well-controlled at 126/78 today. - continue Coreg 12.5mg  BID and Losartan 50mg  daily.   4. HLD - Lipid Panel in 04/2015 showed total cholesterol 107, Triglycerides 172, HDL 24,  and LDL 49. - continue Atorvastatin 80mg  daily. Had been unable to tolerate Fish Oil due to abdominal discomfort. Advised the patient to try taking this with food to see if this helps with his symptoms.     Medication Adjustments/Labs and Tests Ordered: Current medicines are reviewed at length with the patient today.  Concerns regarding medicines are outlined above.  Medication changes,  Labs and Tests ordered today are listed in the Patient Instructions below. Patient Instructions  Medication Instructions: Your physician recommends that you continue on your current medications as directed. Please refer to the Current Medication list given to you today.   Follow-Up: Follow-up with [name] in January 2018.  If you need a refill on your cardiac medications before your next appointment, please call your pharmacy.     Arna Medici, PA  12/10/2015 4:47 PM    [facility] [place], [place], [place]  [address] Phone: [phone]; Fax: [phone]  [address], [place], [place] [address] Phone: [phone]

## 2016-01-06 ENCOUNTER — Other Ambulatory Visit: Payer: Self-pay

## 2016-01-06 MED ORDER — ATORVASTATIN CALCIUM 80 MG PO TABS: 80.0000 mg | ORAL_TABLET | Freq: Every day | ORAL | 2 refills | Status: DC

## 2016-01-14 ENCOUNTER — Encounter (HOSPITAL_COMMUNITY): Payer: BLUE CROSS/BLUE SHIELD

## 2016-01-19 ENCOUNTER — Other Ambulatory Visit: Payer: Self-pay | Admitting: *Deleted

## 2016-01-19 MED ORDER — TICAGRELOR 90 MG PO TABS: ORAL_TABLET | ORAL | 2 refills | Status: DC

## 2016-02-16 ENCOUNTER — Ambulatory Visit (INDEPENDENT_AMBULATORY_CARE_PROVIDER_SITE_OTHER): Payer: BLUE CROSS/BLUE SHIELD | Admitting: Cardiovascular Disease

## 2016-02-16 ENCOUNTER — Encounter: Payer: Self-pay | Admitting: Cardiovascular Disease

## 2016-02-16 VITALS — BP 126/84 | HR 92 | Ht 72.0 in | Wt 211.0 lb

## 2016-02-16 DIAGNOSIS — I10 Essential (primary) hypertension: Secondary | ICD-10-CM

## 2016-02-16 DIAGNOSIS — I2510 Atherosclerotic heart disease of native coronary artery without angina pectoris: Secondary | ICD-10-CM

## 2016-02-16 DIAGNOSIS — I255 Ischemic cardiomyopathy: Secondary | ICD-10-CM

## 2016-02-16 DIAGNOSIS — E785 Hyperlipidemia, unspecified: Secondary | ICD-10-CM

## 2016-02-16 DIAGNOSIS — I110 Hypertensive heart disease with heart failure: Secondary | ICD-10-CM | POA: Diagnosis not present

## 2016-02-16 MED ORDER — CARVEDILOL 25 MG PO TABS: 25.0000 mg | ORAL_TABLET | Freq: Two times a day (BID) | ORAL | 11 refills | Status: DC

## 2016-02-16 NOTE — Patient Instructions (Signed)
Your physician has recommended you make the following change in your medication:   1.) the carvedilol has been increased to 18.75 mg twice daily for 2 weeks then 25 mg twice daily thereafter.  Your physician recommends that you schedule a follow-up appointment in: 4 months. With Dr Claiborne Billings.

## 2016-02-18 NOTE — Progress Notes (Signed)
Patient ID: Cesar Woods, male   DOB: 01-29-1966, 51 y.o.   MRN: [id number]    Primary MD: [name]  HPI: Cesar Woods is a 51 y.o. male who presents to the office today for a 4 month follow up cardiology evaluation.  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  In December, he developed substernal chest discomfort which he initially attributed to GERD.  This continued to recur over that day.  The following day he went to work and continued to experience vague discomfort.  He was seen by his primary physician, Dr. Scarlette Ar in his ECG was abnormal.  Changes of an anterolateral MI.  He presented to the emergency room.  He was not having any chest pain or shortness of breath or palpitations.  He underwent cardiac catheterization the following morning on 01/15/2015 which revealed significant CAD with 90% ostial LAD stenosis followed by 99% proximal LAD stenosis in the region of the first septal perforating artery with significant thrombus.  There was diffuse 50% stenosis followed by 95% ulcerated plaque in the proximal to mid LAD.  There also appeared to be an apical LAD dissection.  40% ramus intermediate, like high marginal stenosis and a large dominant RCA with 30% mid stenosis.  Large PDA and PLA vessels with septal collateralization to the proximal LAD.  He was felt to have an out of hospital late presentation anterolateral infarction secondary to his multiple high-grade stenoses.  He underwent a very difficult but successful intervention to his diffusely diseased LAD.  There was evidence for a spiral dissection in the mid distal LAD but ultimately was insertion of 3 tandem synergy DES stents (0.538, 3.038, and 3.020 mm from the mid LAD to the ostium and PTCA of the distal LAD with low-level inflation tach up his apical initial dissection.  There was total occlusion of the diagonal vessel  antegrade but there was significant development of retrograde collaterals to this diagonal vessel from the distal LAD.  Subsequent, he stabilized well.  He was seen in follow-up by Tenny Craw on 01/24/2015 and was doing well without chest pain or shortness of breath.  At that time his blood pressure was controlled.  He participated in the cardiac rehabilitation program.  He is back at work and has a stressful job in Engineer, mining for [employer].   When I saw him for follow-up evaluation in March 2017.  His ECG showed Q waves in V1 through V4 4 with persistent T-wave inversion anteriorly and anterolaterally.  I recommended that he discontinue metoprolol succinate and changed him to carvedilol.  He is now on carvedilol at 6.25 mg twice a day and is tolerating this well.  He continues to be on low-dose losartan 12.5 mg.  I scheduled him for follow-up echo Doppler study to reassess his LV function.  This was done on 05/30/2015.  Ejection fraction was now 40-45% and there was evidence for akinesis of the apical myocardium.  There was grade 1 diastolic dysfunction.  When I saw him in May 2017 for follow-up evaluation, he denied recurrent chest pain, PND or orthopnea.  At time, I slowly titrated ARB therapy and increase losartan to 25 mg at bedtime.  So increased his carvedilol to 9.375 mg twice a day for 2 weeks and further titrated this to 12.5 mg daily.   Since I last saw him, his carvedilol has been titrated up to 12.5 mg  twice a day.  He developed a recurrent episode of chest pain in October 2017 and was hospitalized.  A nuclear perfusion study was performed which showed his previous large fixed defect involving the apex extending towards the mid ventricle in the anterior and lateral wall.  There is very minimal if any peri-infarct ischemia with an EF of 43%.  These were felt to be consistent with his prior MI.  He saw Melvyn Neth, PA in the office for follow-up evaluation.  He denies recurrent chest  pain.  He presents for reevaluation.  Past Medical History:  Diagnosis Date  . ACS (acute coronary syndrome) ([place]) 11/2015  . Atypical chest pain    Negative Myoview 2010  . Coronary artery disease    a.s/p STEMI in 01/2015 requiring placement of 4 DES to LAD. b. 11/2015: NST showing apical scar with minimal peri-infarct ischemia.  . Essential hypertension   . Food allergy    Anaphylaxis with shell fish  . GERD (gastroesophageal reflux disease)   . History of migraines   . Hyperlipidemia   . Ischemic cardiomyopathy    a. Echo 05/2015: EF 40-45%, apical akineiss with Grade 1 DD.  [name] Myocardial infarction 2016  . Testicle cancer ([place])    In remission - followed by Dr. Risa Grill; had bilateral orchiectomy and XRT in past    Past Surgical History:  Procedure Laterality Date  . CARDIAC CATHETERIZATION N/A 01/15/2015   Procedure: Left Heart Cath and Coronary Angiography;  Surgeon: Troy Sine, MD;  Location: [facility];  Service: Cardiovascular;  Laterality: N/A;  . CARDIAC CATHETERIZATION  01/15/2015   Procedure: Coronary Stent Intervention;  Surgeon: Troy Sine, MD;  Location: [facility];  Service: Cardiovascular;;  . ORCHIECTOMY Bilateral     Allergies  Allergen Reactions  . Shellfish Allergy Anaphylaxis  . Iodine     REACTION: Reaction not known  . Penicillins Other (See Comments)    unknown  . Ace Inhibitors Cough    Current Outpatient Prescriptions  Medication Sig Dispense Refill  . aspirin EC 81 MG EC tablet Take 1 tablet (81 mg total) by mouth daily.    [name] atorvastatin (LIPITOR) 80 MG tablet Take 1 tablet (80 mg total) by mouth daily at 6 PM. 30 tablet 2  . EPIPEN 2-PAK 0.3 MG/0.3ML SOAJ injection INJECT 0.3 MLS INTO THE MUSCLE ONCE **PATIENT NEEDS TO SCHEDULE AN OFFICE VISIT** 2 Device 0  . losartan (COZAAR) 50 MG tablet 1 tablet    . nitroGLYCERIN (NITROSTAT) 0.4 MG SL tablet Place 1 tablet (0.4 mg total) under the tongue every 5 (five) minutes x 3  doses as needed for chest pain. 25 tablet 12  . Omega-3 Fatty Acids (FISH OIL) 1000 MG CAPS Take 1 capsule by mouth daily.    . Testosterone (ANDROGEL PUMP) 20.25 MG/ACT (1.62%) GEL 5 pumps    . ticagrelor (BRILINTA) 90 MG TABS tablet TAKE 1 TABLET (90 MG TOTAL) BY MOUTH 2 (TWO) TIMES DAILY. 60 tablet 2  . carvedilol (COREG) 25 MG tablet Take 1 tablet (25 mg total) by mouth 2 (two) times daily. 60 tablet 11   No current facility-administered medications for this visit.     Social History   Social History  . Marital status: Married    Spouse name: N/A  . Number of children: N/A  . Years of education: N/A   Occupational History  . Not on file.   Social History Main Topics  . Smoking status: Former  Smoker  . Smokeless tobacco: Never Used     Comment: quit around 2010  . Alcohol use 0.0 oz/week     Comment: occasionally  . Drug use: No     Comment: remote use of marijuana, quit a long time ago  . Sexual activity: Not on file   Other Topics Concern  . Not on file   Social History Narrative   Occupation:Senior Trader for [employer]   Divorced    No children      Former smoker    Alcohol Use - yes         Additional social history is notable that he was born in [place], [place].  He lived for over a decade in [place].  He is married.  Former smoker.  Family History  Problem Relation Age of Onset  . Heart attack Father     CABG at agae 25  . Stroke Paternal Grandfather   . Hypertension    . Hyperlipidemia    . Heart attack Maternal Grandmother     ROS General: Negative; No fevers, chills, or night sweats HEENT: Negative; No changes in vision or hearing, sinus congestion, difficulty swallowing Pulmonary: Negative; No cough, wheezing, shortness of breath, hemoptysis Cardiovascular: See HPI: No recurrent chest pain, presyncope, syncope, palpatations GI: Negative; No nausea, vomiting, diarrhea, or abdominal pain GU: History of testicular cancer, status post  orchiectomy. Musculoskeletal: Negative; no myalgias, joint pain, or weakness Hematologic: Negative; no easy bruising, bleeding Endocrine: Negative; no heat/cold intolerance; no diabetes, Neuro: Negative; no changes in balance, headaches Skin: Negative; No rashes or skin lesions Psychiatric: Negative; No behavioral problems, depression Sleep: Negative; No snoring,  daytime sleepiness, hypersomnolence, bruxism, restless legs, hypnogognic hallucinations. Other comprehensive 14 point system review is negative   Physical Exam BP 126/84   Pulse 92   Ht 6' (1.829 m)   Wt 211 lb (95.7 kg)   BMI 28.62 kg/m     Wt Readings from Last 3 Encounters:  02/16/16 211 lb (95.7 kg)  12/10/15 203 lb 9.6 oz (92.4 kg)  11/21/15 200 lb (90.7 kg)   General: Alert, oriented, no distress.  Skin: normal turgor, no rashes, warm and dry HEENT: Normocephalic, atraumatic. Pupils equal round and reactive to light; sclera anicteric; extraocular muscles intact, No lid lag; Nose without nasal septal hypertrophy; Mouth/Parynx benign; Mallinpatti scale 2 Neck: No JVD, no carotid bruits; normal carotid upstroke Lungs: clear to ausculatation and percussion bilaterally; no wheezing or rales, normal inspiratory and expiratory effort Chest wall: without tenderness to palpitation Heart: PMI not displaced, RRR, s1 s2 normal, 1/6 systolic murmur, No diastolic murmur, no rubs, gallops, thrills, or heaves Abdomen: soft, nontender; no hepatosplenomehaly, BS+; abdominal aorta nontender and not dilated by palpation. Back: no CVA tenderness Pulses: 2+  Musculoskeletal: full range of motion, normal strength, no joint deformities Extremities: Pulses 2+, no clubbing cyanosis or edema, Homan's sign negative  Neurologic: grossly nonfocal; Cranial nerves grossly wnl Psychologic: Normal mood and affect  ECG (independently read by me): Normal sinus rhythm at 92 bpm.  Anterior Q waves consistent with his prior anterolateral  MI.  September 2017 ECG (independently read by me): Sinus rhythm at 83 bpm.  QRS complex V1-V6  concordant with his large anterolateral MI.  May 2017 ECG (independently read by me): Normal sinus rhythm at 79 bpm.  T-wave inversion V2 through V6 and 1 and aVL concordant with his prior anterior to anterolateral MI.  March 2017 ECG (independently read by me): NSR at 80; QS  V1-4 with T wave inversion V1-V6, I and aVL  LABS:  BMP Latest Ref Rng & Units 11/22/2015 11/21/2015 11/21/2015  Glucose 65 - 99 mg/dL 92 - 119(H)  BUN 6 - 20 mg/dL 13 - 13  Creatinine 0.61 - 1.24 mg/dL 0.81 0.97 0.85  Sodium 135 - 145 mmol/L 140 - 140  Potassium 3.5 - 5.1 mmol/L 3.8 - 4.2  Chloride 101 - 111 mmol/L 107 - 107  CO2 22 - 32 mmol/L 25 - 27  Calcium 8.9 - 10.3 mg/dL 8.7(L) - 9.1     Hepatic Function Latest Ref Rng & Units 04/16/2015 01/14/2015 09/14/2012  Total Protein 6.1 - 8.1 g/dL 6.7 7.0 7.2  Albumin 3.6 - 5.1 g/dL 4.3 4.1 4.2  AST 10 - 40 U/L 15 82(H) 17  ALT 9 - 46 U/L 14 34 26  Alk Phosphatase 40 - 115 U/L 96 74 67  Total Bilirubin 0.2 - 1.2 mg/dL 1.5(H) 1.6(H) 1.3(H)  Bilirubin, Direct 0.0 - 0.3 mg/dL - - 0.1    CBC Latest Ref Rng & Units 11/22/2015 11/21/2015 11/21/2015  WBC 4.0 - 10.5 K/uL 8.6 8.3 8.4  Hemoglobin 13.0 - 17.0 g/dL 13.8 13.1 13.8  Hematocrit 39.0 - 52.0 % 41.2 39.3 40.8  Platelets 150 - 400 K/uL 145(L) 146(L) 146(L)   Lab Results  Component Value Date   MCV 87.7 11/22/2015   MCV 87.9 11/21/2015   MCV 87.7 11/21/2015    Lab Results  Component Value Date   TSH 0.36 (L) 04/16/2015    BNP    Component Value Date/Time   BNP 358.1 (H) 01/17/2015 0251   BNP <2.0 06/26/2010 1334    ProBNP    Component Value Date/Time   PROBNP <2.0 pg/mL 10/17/2009 1834     Lipid Panel     Component Value Date/Time   CHOL 107 (L) 04/16/2015 1028   TRIG 172 (H) 04/16/2015 1028   HDL 24 (L) 04/16/2015 1028   CHOLHDL 4.5 04/16/2015 1028   VLDL 34 (H) 04/16/2015 1028   LDLCALC  49 04/16/2015 1028   LDLDIRECT 99.9 09/15/2012 1738     RADIOLOGY: No results found.  IMPRESSION:  1. Hypertensive heart disease with heart failure ([place])   2. Essential hypertension   3. CAD in native artery   4. Cardiomyopathy, ischemic   5. Hyperlipidemia with target LDL less than 70     ASSESSMENT AND PLAN: Cesar Woods is a 51 year old white male who presented to [facility] hospital on the evening of 01/14/2015 with late presentation following an anterolateral MI which most likely occurred the day prior to his presentation.  In the emergency room, he was pain-free.  Cardiac catheterization revealed severe diffuse CAD with ulcerated plaque and thrombus and evidence for spontaneous distal LAD dissection.  He had a very difficult but successful PCI to his LAD, and ultimate insertion of 3 Synergy DES stents covering a total of 96 mm and extending from the ostium to the mid LAD.  He also had PTCA of his distal LAD dissection.  He has done remarkably well since that time and has been participating in cardiac rehabilitation. Over the past several months, his beta blocker was changed to carvedilol and his dose was titrated.  His recent echo Doppler shows an EF of 40-45% and apical akinesis.  He has been able to tolerate titration of his carvedilol and is now on 12.5 mg twice a day in addition to losartan 50 mg daily.  I reviewed his most recent nuclear  perfusion study which is consistent with his prior MI.  He is not well beta blocked despite his current dose of carvedilol and I will titrate this to 18.75 mg twice a day for 2 weeks and he will then further increase this to 25 mg twice a day.  Is to be on atorvastatin 80 mg for hyperlipidemia with target LDL less than 70.  He is on dual antiplatelet therapy.  There is no bleeding.  I will see him in 4 months for reevaluation.  Time spent: 25 minutes Troy Sine, MD, [facility]  02/18/2016 8:18 PM

## 2016-02-23 NOTE — Addendum Note (Signed)
Encounter addended by: Novalee Horsfall D Delrick Dehart on: 02/23/2016 10:52 AM<BR>    Actions taken: Sign clinical note

## 2016-02-23 NOTE — Progress Notes (Signed)
Reviewed home exercise with pt today.  Pt plans to walk for exercise.  Reviewed THR, pulse, RPE, sign and symptoms, NTG use, and when to call 911 or MD.  Also discussed weather considerations and indoor options.  Pt voiced understanding.    Leafy Motsinger,MS,ACSM RCEP 

## 2016-04-04 ENCOUNTER — Other Ambulatory Visit: Payer: Self-pay | Admitting: Cardiovascular Disease

## 2016-04-08 ENCOUNTER — Other Ambulatory Visit: Payer: Self-pay | Admitting: *Deleted

## 2016-04-08 NOTE — Telephone Encounter (Signed)
Patient left a msg on the refill vm stating that he accidentally washed the rx for carvedilol that he was given.

## 2016-04-08 NOTE — Telephone Encounter (Signed)
Pt calling again,saying he needs his Carvedilol called in today to [phone].His new prescription was washed in the washing machine.

## 2016-04-09 MED ORDER — CARVEDILOL 25 MG PO TABS: 25.0000 mg | ORAL_TABLET | Freq: Two times a day (BID) | ORAL | 11 refills | Status: DC

## 2016-04-09 NOTE — Telephone Encounter (Signed)
Rx(s) sent to pharmacy electronically.  

## 2016-04-20 ENCOUNTER — Ambulatory Visit (INDEPENDENT_AMBULATORY_CARE_PROVIDER_SITE_OTHER): Payer: BLUE CROSS/BLUE SHIELD | Admitting: Cardiovascular Disease

## 2016-04-20 ENCOUNTER — Encounter: Payer: Self-pay | Admitting: Cardiovascular Disease

## 2016-04-20 VITALS — BP 122/80 | HR 79 | Ht 72.0 in | Wt 205.4 lb

## 2016-04-20 DIAGNOSIS — I2510 Atherosclerotic heart disease of native coronary artery without angina pectoris: Secondary | ICD-10-CM | POA: Diagnosis not present

## 2016-04-20 DIAGNOSIS — E785 Hyperlipidemia, unspecified: Secondary | ICD-10-CM | POA: Diagnosis not present

## 2016-04-20 DIAGNOSIS — E782 Mixed hyperlipidemia: Secondary | ICD-10-CM

## 2016-04-20 DIAGNOSIS — I10 Essential (primary) hypertension: Secondary | ICD-10-CM

## 2016-04-20 DIAGNOSIS — I255 Ischemic cardiomyopathy: Secondary | ICD-10-CM | POA: Diagnosis not present

## 2016-04-20 DIAGNOSIS — I110 Hypertensive heart disease with heart failure: Secondary | ICD-10-CM | POA: Diagnosis not present

## 2016-04-20 MED ORDER — CARVEDILOL 25 MG PO TABS: 25.0000 mg | ORAL_TABLET | Freq: Two times a day (BID) | ORAL | 3 refills | Status: DC

## 2016-04-20 NOTE — Progress Notes (Signed)
Patient ID: Cesar Woods, male   DOB: 10-12-1965, 51 y.o.   MRN: [id number]    Primary MD: [name]  HPI: Cesar Woods is a 51 y.o. male who presents to the office today for a 2 month follow up cardiology evaluation.  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  In December, he developed substernal chest discomfort which he initially attributed to GERD.  This continued to recur over that day.  The following day he went to work and continued to experience vague discomfort.  He was seen by his primary physician, Dr. Scarlette Ar in his ECG was abnormal.  Changes of an anterolateral MI.  He presented to the emergency room.  He was not having any chest pain or shortness of breath or palpitations.  He underwent cardiac catheterization the following morning on 01/15/2015 which revealed significant CAD with 90% ostial LAD stenosis followed by 99% proximal LAD stenosis in the region of the first septal perforating artery with significant thrombus.  There was diffuse 50% stenosis followed by 95% ulcerated plaque in the proximal to mid LAD.  There also appeared to be an apical LAD dissection.  40% ramus intermediate, like high marginal stenosis and a large dominant RCA with 30% mid stenosis.  Large PDA and PLA vessels with septal collateralization to the proximal LAD.  He was felt to have an out of hospital late presentation anterolateral infarction secondary to his multiple high-grade stenoses.  He underwent a very difficult but successful intervention to his diffusely diseased LAD.  There was evidence for a spiral dissection in the mid distal LAD but ultimately was insertion of 3 tandem synergy DES stents (0.538, 3.038, and 3.020 mm from the mid LAD to the ostium and PTCA of the distal LAD with low-level inflation tach up his apical initial dissection.  There was total occlusion of the diagonal vessel  antegrade but there was significant development of retrograde collaterals to this diagonal vessel from the distal LAD.  Subsequent, he stabilized well.  He was seen in follow-up by Cesar Woods on 01/24/2015 and was doing well without chest pain or shortness of breath.  At that time his blood pressure was controlled.  He participated in the cardiac rehabilitation program.  He is back at work and has a stressful job in Engineer, mining for [employer].   When I saw him for follow-up evaluation in March 2017.  His ECG showed Q waves in V1 through V4 4 with persistent T-wave inversion anteriorly and anterolaterally.  I recommended that he discontinue metoprolol succinate and changed him to carvedilol.  He is now on carvedilol at 6.25 mg twice a day and is tolerating this well.  He continues to be on low-dose losartan 12.5 mg.  I scheduled him for follow-up echo Doppler study to reassess his LV function.  This was done on 05/30/2015.  Ejection fraction was now 40-45% and there was evidence for akinesis of the apical myocardium.  There was grade 1 diastolic dysfunction.  When I saw him in May 2017 for follow-up evaluation, he denied recurrent chest pain, PND or orthopnea.  At time, I slowly titrated ARB therapy and increase losartan to 25 mg at bedtime.  So increased his carvedilol to 9.375 mg twice a day for 2 weeks and further titrated this to 12.5 mg daily.    He developed a recurrent episode of chest pain in October 2017 and was  hospitalized.  A nuclear perfusion study was performed which showed his previous large fixed defect involving the apex extending towards the mid ventricle in the anterior and lateral wall.  There is very minimal if any peri-infarct ischemia with an EF of 43%.  These were felt to be consistent with his prior MI.    When I last saw him 2 months ago, I further titrated his carvedilol and suggested that he take 18.75 mg twice a day for 2 weeks and then further increase this to 25 mg twice a  day.  Last week, he had experience increased sensation of palpitations.  Apparently, he had never completed the titration.  He denies chest pain.  He denies PND or orthopnea.  He presents for evaluation.   Past Medical History:  Diagnosis Date  . ACS (acute coronary syndrome) ([place]) 11/2015  . Atypical chest pain    Negative Myoview 2010  . Coronary artery disease    a.s/p STEMI in 01/2015 requiring placement of 4 DES to LAD. b. 11/2015: NST showing apical scar with minimal peri-infarct ischemia.  . Essential hypertension   . Food allergy    Anaphylaxis with shell fish  . GERD (gastroesophageal reflux disease)   . History of migraines   . Hyperlipidemia   . Ischemic cardiomyopathy    a. Echo 05/2015: EF 40-45%, apical akineiss with Grade 1 DD.  [name] Myocardial infarction 2016  . Testicle cancer ([place])    In remission - followed by Dr. Risa Grill; had bilateral orchiectomy and XRT in past    Past Surgical History:  Procedure Laterality Date  . CARDIAC CATHETERIZATION N/A 01/15/2015   Procedure: Left Heart Cath and Coronary Angiography;  Surgeon: Troy Sine, MD;  Location: [facility];  Service: Cardiovascular;  Laterality: N/A;  . CARDIAC CATHETERIZATION  01/15/2015   Procedure: Coronary Stent Intervention;  Surgeon: Troy Sine, MD;  Location: [facility];  Service: Cardiovascular;;  . ORCHIECTOMY Bilateral     Allergies  Allergen Reactions  . Shellfish Allergy Anaphylaxis  . Iodine     REACTION: Reaction not known  . Penicillins Other (See Comments)    unknown  . Ace Inhibitors Cough    Current Outpatient Prescriptions  Medication Sig Dispense Refill  . aspirin EC 81 MG EC tablet Take 1 tablet (81 mg total) by mouth daily.    [name] atorvastatin (LIPITOR) 80 MG tablet TAKE 1 TABLET BY MOUTH DAILY AT 6 PM 30 tablet 2  . carvedilol (COREG) 25 MG tablet Take 1 tablet (25 mg total) by mouth 2 (two) times daily with a meal. 180 tablet 3  . EPIPEN 2-PAK 0.3 MG/0.3ML  SOAJ injection INJECT 0.3 MLS INTO THE MUSCLE ONCE **PATIENT NEEDS TO SCHEDULE AN OFFICE VISIT** 2 Device 0  . losartan (COZAAR) 50 MG tablet 1 tablet    . nitroGLYCERIN (NITROSTAT) 0.4 MG SL tablet Place 1 tablet (0.4 mg total) under the tongue every 5 (five) minutes x 3 doses as needed for chest pain. 25 tablet 12  . Omega-3 Fatty Acids (FISH OIL) 1000 MG CAPS Take 1 capsule by mouth daily.    . Testosterone (ANDROGEL PUMP) 20.25 MG/ACT (1.62%) GEL 5 pumps    . BRILINTA 90 MG TABS tablet TAKE 1 TABLET BY MOUTH TWICE A DAY 180 tablet 3   No current facility-administered medications for this visit.     Social History   Social History  . Marital status: Married    Spouse name: N/A  .  Number of children: N/A  . Years of education: N/A   Occupational History  . Not on file.   Social History Main Topics  . Smoking status: Former Research scientist (life sciences)  . Smokeless tobacco: Never Used     Comment: quit around 2010  . Alcohol use 0.0 oz/week     Comment: occasionally  . Drug use: No     Comment: remote use of marijuana, quit a long time ago  . Sexual activity: Not on file   Other Topics Concern  . Not on file   Social History Narrative   Occupation:Senior Trader for [employer]   Divorced    No children      Former smoker    Alcohol Use - yes         Additional social history is notable that he was born in [place], [place].  He lived for over a decade in [place].  He is married.  Former smoker.  Family History  Problem Relation Age of Onset  . Heart attack Father     CABG at agae 64  . Stroke Paternal Grandfather   . Hypertension    . Hyperlipidemia    . Heart attack Maternal Grandmother     ROS General: Negative; No fevers, chills, or night sweats HEENT: Negative; No changes in vision or hearing, sinus congestion, difficulty swallowing Pulmonary: Negative; No cough, wheezing, shortness of breath, hemoptysis Cardiovascular: See HPI: No recurrent chest pain, presyncope, syncope,  palpatations GI: Negative; No nausea, vomiting, diarrhea, or abdominal pain GU: History of testicular cancer, status post orchiectomy. Musculoskeletal: Negative; no myalgias, joint pain, or weakness Hematologic: Negative; no easy bruising, bleeding Endocrine: Negative; no heat/cold intolerance; no diabetes, Neuro: Negative; no changes in balance, headaches Skin: Negative; No rashes or skin lesions Psychiatric: Negative; No behavioral problems, depression Sleep: Negative; No snoring,  daytime sleepiness, hypersomnolence, bruxism, restless legs, hypnogognic hallucinations. Other comprehensive 14 point system review is negative   Physical Exam BP 122/80   Pulse 79   Ht 6' (1.829 m)   Wt 205 lb 6.4 oz (93.2 kg)   BMI 27.86 kg/m    Repeat blood pressure by me 122/80   Wt Readings from Last 3 Encounters:  04/20/16 205 lb 6.4 oz (93.2 kg)  02/16/16 211 lb (95.7 kg)  12/10/15 203 lb 9.6 oz (92.4 kg)   General: Alert, oriented, no distress.  Skin: normal turgor, no rashes, warm and dry HEENT: Normocephalic, atraumatic. Pupils equal round and reactive to light; sclera anicteric; extraocular muscles intact, No lid lag; Nose without nasal septal hypertrophy; Mouth/Parynx benign; Mallinpatti scale 2 Neck: No JVD, no carotid bruits; normal carotid upstroke Lungs: clear to ausculatation and percussion bilaterally; no wheezing or rales, normal inspiratory and expiratory effort Chest wall: without tenderness to palpitation Heart: PMI not displaced, RRR, s1 s2 normal, 1/6 systolic murmur, No diastolic murmur, no rubs, gallops, thrills, or heaves Abdomen: soft, nontender; no hepatosplenomehaly, BS+; abdominal aorta nontender and not dilated by palpation. Back: no CVA tenderness Pulses: 2+  Musculoskeletal: full range of motion, normal strength, no joint deformities Extremities: Pulses 2+, no clubbing cyanosis or edema, Homan's sign negative  Neurologic: grossly nonfocal; Cranial nerves grossly  wnl Psychologic: Normal mood and affect  ECG (independently read by me): Normal sinus rhythm at 79 bpm.  Old anterolateral infarct changes.  QRS complex V1 through V5; normal intervals.  No significant ST changes.  January 2018 ECG (independently read by me): Normal sinus rhythm at 92 bpm.  Anterior Q waves consistent  with his prior anterolateral MI.  September 2017 ECG (independently read by me): Sinus rhythm at 83 bpm.  QRS complex V1-V6  concordant with his large anterolateral MI.  May 2017 ECG (independently read by me): Normal sinus rhythm at 79 bpm.  T-wave inversion V2 through V6 and 1 and aVL concordant with his prior anterior to anterolateral MI.  March 2017 ECG (independently read by me): NSR at 80; QS V1-4 with T wave inversion V1-V6, I and aVL  LABS:  BMP Latest Ref Rng & Units 11/22/2015 11/21/2015 11/21/2015  Glucose 65 - 99 mg/dL 92 - 119(H)  BUN 6 - 20 mg/dL 13 - 13  Creatinine 0.61 - 1.24 mg/dL 0.81 0.97 0.85  Sodium 135 - 145 mmol/L 140 - 140  Potassium 3.5 - 5.1 mmol/L 3.8 - 4.2  Chloride 101 - 111 mmol/L 107 - 107  CO2 22 - 32 mmol/L 25 - 27  Calcium 8.9 - 10.3 mg/dL 8.7(L) - 9.1     Hepatic Function Latest Ref Rng & Units 04/16/2015 01/14/2015 09/14/2012  Total Protein 6.1 - 8.1 g/dL 6.7 7.0 7.2  Albumin 3.6 - 5.1 g/dL 4.3 4.1 4.2  AST 10 - 40 U/L 15 82(H) 17  ALT 9 - 46 U/L 14 34 26  Alk Phosphatase 40 - 115 U/L 96 74 67  Total Bilirubin 0.2 - 1.2 mg/dL 1.5(H) 1.6(H) 1.3(H)  Bilirubin, Direct 0.0 - 0.3 mg/dL - - 0.1    CBC Latest Ref Rng & Units 11/22/2015 11/21/2015 11/21/2015  WBC 4.0 - 10.5 K/uL 8.6 8.3 8.4  Hemoglobin 13.0 - 17.0 g/dL 13.8 13.1 13.8  Hematocrit 39.0 - 52.0 % 41.2 39.3 40.8  Platelets 150 - 400 K/uL 145(L) 146(L) 146(L)   Lab Results  Component Value Date   MCV 87.7 11/22/2015   MCV 87.9 11/21/2015   MCV 87.7 11/21/2015    Lab Results  Component Value Date   TSH 0.36 (L) 04/16/2015    BNP    Component Value Date/Time   BNP  358.1 (H) 01/17/2015 0251   BNP <2.0 06/26/2010 1334    ProBNP    Component Value Date/Time   PROBNP <2.0 pg/mL 10/17/2009 1834     Lipid Panel     Component Value Date/Time   CHOL 107 (L) 04/16/2015 1028   TRIG 172 (H) 04/16/2015 1028   HDL 24 (L) 04/16/2015 1028   CHOLHDL 4.5 04/16/2015 1028   VLDL 34 (H) 04/16/2015 1028   LDLCALC 49 04/16/2015 1028   LDLDIRECT 99.9 09/15/2012 1738     RADIOLOGY: No results found.  IMPRESSION:  1. Coronary artery disease involving native coronary artery of native heart without angina pectoris   2. Hypertensive heart disease with heart failure (HCC)   3. Cardiomyopathy, ischemic   4. Essential hypertension   5. Hyperlipidemia with target LDL less than 70   6. Mixed hyperlipidemia     ASSESSMENT AND PLAN: Cesar Woods is a 51 year old white male who presented to [facility] on the evening of 01/14/2015 with late presentation following an anterolateral MI which most likely occurred the day prior to his presentation.  In the emergency room, he was pain-free. Cardiac catheterization revealed severe diffuse CAD with ulcerated plaque and thrombus and evidence for spontaneous distal LAD dissection.  He had a very difficult but successful PCI to his LAD, and ultimate insertion of 3 Synergy DES stents covering a total of 96 mm and extending from the ostium to the mid LAD.  He also had  PTCA of his distal LAD dissection.  He has done remarkably well since that time and has been participating in cardiac rehabilitation. Over the past several months, his beta blocker was changed to carvedilol and his dose was titrated.  His last echo Doppler shows an EF of 40-45% and apical akinesis.  His blood pressure today is well controlled on losartan 50 mg.  He has experienced some increased palpitations last week and I have recommended since he just has the 25 mg pill and no longer has the 12.5 mg pill, that he increase carvedilol to 25 mg in the morning and  12.5 mg at night for 2 weeks and then increase this to 25 mg twice a day.  He continues to be on atorvastatin 80 mg for hyperlipidemia with target LDL less than 70, and also takes over-the-counter omega-3 fatty acids.  He is on dual antiplatelet therapy with aspirin and Brilinta and is without bleeding.  A complete set of fasting laboratory will be obtained including a hemoglobin A1c.  Adjustments to his medical regimen will be made if necessary.  As long as he remains stable I will see him in 4 months for reevaluation.    Time spent: 25 minutes Troy Sine, MD, [facility]  04/21/2016 6:28 PM

## 2016-04-20 NOTE — Patient Instructions (Signed)
Medication Instructions:   TAKE CARVEDILOL 25 MG IN THE MORNING AND 12.5 MG IN THE EVENING X 2 WEEKS THEN INCREASE TO 25 MG TWICE DAILY  Labwork:  Your physician recommends that you return for lab work WHEN FASTING  Follow-Up:  Your physician wants you to follow-up in: [place] will receive a reminder letter in the mail two months in advance. If you don't receive a letter, please call our office to schedule the follow-up appointment.   If you need a refill on your cardiac medications before your next appointment, please call your pharmacy.

## 2016-04-21 ENCOUNTER — Other Ambulatory Visit: Payer: Self-pay | Admitting: Cardiovascular Disease

## 2016-05-15 ENCOUNTER — Encounter (HOSPITAL_COMMUNITY): Payer: Self-pay | Admitting: Emergency Medicine

## 2016-05-15 ENCOUNTER — Other Ambulatory Visit: Payer: Self-pay

## 2016-05-15 ENCOUNTER — Emergency Department (HOSPITAL_COMMUNITY)
Admission: EM | Admit: 2016-05-15 | Discharge: 2016-05-15 | Disposition: A | Discharge status: Home / Self Care | Payer: BLUE CROSS/BLUE SHIELD | Attending: Emergency Medicine | Admitting: Emergency Medicine

## 2016-05-15 ENCOUNTER — Emergency Department (HOSPITAL_COMMUNITY): Payer: BLUE CROSS/BLUE SHIELD

## 2016-05-15 DIAGNOSIS — I110 Hypertensive heart disease with heart failure: Secondary | ICD-10-CM | POA: Insufficient documentation

## 2016-05-15 DIAGNOSIS — R0789 Other chest pain: Secondary | ICD-10-CM

## 2016-05-15 DIAGNOSIS — Z79899 Other long term (current) drug therapy: Secondary | ICD-10-CM | POA: Insufficient documentation

## 2016-05-15 DIAGNOSIS — Z87891 Personal history of nicotine dependence: Secondary | ICD-10-CM | POA: Diagnosis not present

## 2016-05-15 DIAGNOSIS — I252 Old myocardial infarction: Secondary | ICD-10-CM | POA: Diagnosis not present

## 2016-05-15 DIAGNOSIS — Z7982 Long term (current) use of aspirin: Secondary | ICD-10-CM | POA: Diagnosis not present

## 2016-05-15 DIAGNOSIS — R072 Precordial pain: Secondary | ICD-10-CM | POA: Diagnosis present

## 2016-05-15 DIAGNOSIS — Z8547 Personal history of malignant neoplasm of testis: Secondary | ICD-10-CM | POA: Diagnosis not present

## 2016-05-15 DIAGNOSIS — I5021 Acute systolic (congestive) heart failure: Secondary | ICD-10-CM | POA: Diagnosis not present

## 2016-05-15 DIAGNOSIS — I2510 Atherosclerotic heart disease of native coronary artery without angina pectoris: Secondary | ICD-10-CM | POA: Diagnosis not present

## 2016-05-15 DIAGNOSIS — R079 Chest pain, unspecified: Secondary | ICD-10-CM | POA: Diagnosis not present

## 2016-05-15 LAB — I-STAT TROPONIN, ED: Troponin i, poc: 0.02 ng/mL (ref 0.00–0.08)

## 2016-05-15 LAB — CBC WITH DIFFERENTIAL/PLATELET
BASOS ABS: 0 10*3/uL (ref 0.0–0.1)
Basophils Relative: 0 %
EOS ABS: 0.2 10*3/uL (ref 0.0–0.7)
EOS PCT: 2 %
HCT: 39.8 % (ref 39.0–52.0)
Hemoglobin: 13.2 g/dL (ref 13.0–17.0)
LYMPHS PCT: 20 %
Lymphs Abs: 1.9 10*3/uL (ref 0.7–4.0)
MCH: 29.5 pg (ref 26.0–34.0)
MCHC: 33.2 g/dL (ref 30.0–36.0)
MCV: 88.8 fL (ref 78.0–100.0)
MONO ABS: 0.9 10*3/uL (ref 0.1–1.0)
Monocytes Relative: 10 %
Neutro Abs: 6.5 10*3/uL (ref 1.7–7.7)
Neutrophils Relative %: 68 %
Platelets: 156 10*3/uL (ref 150–400)
RBC: 4.48 MIL/uL (ref 4.22–5.81)
RDW: 13.2 % (ref 11.5–15.5)
WBC: 9.6 10*3/uL (ref 4.0–10.5)

## 2016-05-15 LAB — BASIC METABOLIC PANEL
ANION GAP: 12 (ref 5–15)
BUN: 17 mg/dL (ref 6–20)
CHLORIDE: 103 mmol/L (ref 101–111)
CO2: 25 mmol/L (ref 22–32)
Calcium: 8.7 mg/dL — ABNORMAL LOW (ref 8.9–10.3)
Creatinine, Ser: 0.8 mg/dL (ref 0.61–1.24)
GFR calc Af Amer: >60 mL/min (ref >60)
GFR calc non Af Amer: >60 mL/min (ref >60)
GLUCOSE: 104 mg/dL — AB (ref 65–99)
POTASSIUM: 3.7 mmol/L (ref 3.5–5.1)
Sodium: 140 mmol/L (ref 135–145)

## 2016-05-15 LAB — TROPONIN I

## 2016-05-15 LAB — CK: CK TOTAL: 127 U/L (ref 49–397)

## 2016-05-15 NOTE — ED Notes (Signed)
IV removed from left AC, AND 2X2 with tape applied.

## 2016-05-15 NOTE — ED Provider Notes (Signed)
[facility] DEPT Provider Note   CSN: [id number] Arrival date & time: 05/15/16  0607     History   Chief Complaint Chief Complaint  Patient presents with  . Chest Pain    HPI Cesar Woods is a 51 y.o. male.  HPI  Patient presents with concern of chest pain, back pain. Patient notes that about one week ago he began to have more persistent sternal discomfort than usual. Over the interval time the symptoms have been waxing, waning severity. Over the past day patient also developed pain in the posterior thorax, midline. Pain is sore, nonradiating, and consistent. Patient did spend yesterday moving some heavy objects, but otherwise has had no notable changes in activity, diet. About 3 weeks ago patient had change in his carvedilol dosing, doubling.   Patient is a notable history of acute coronary syndrome requiring catheterization, stenting.  He states that he takes all of his other medication as directed, regularly. He is here with his wife who assists with the history of present illness. Allergies systems questioned, the patient denies any new asymmetric weakness, any new loss of sensation anywhere, any confusion, disorientation. There is some lightheadedness, but no syncope.  Past Medical History:  Diagnosis Date  . ACS (acute coronary syndrome) ([place]) 11/2015  . Atypical chest pain    Negative Myoview 2010  . Coronary artery disease    a.s/p STEMI in 01/2015 requiring placement of 4 DES to LAD. b. 11/2015: NST showing apical scar with minimal peri-infarct ischemia.  . Essential hypertension   . Food allergy    Anaphylaxis with shell fish  . GERD (gastroesophageal reflux disease)   . History of migraines   . Hyperlipidemia   . Ischemic cardiomyopathy    a. Echo 05/2015: EF 40-45%, apical akineiss with Grade 1 DD.  [name] Myocardial infarction 2016  . Testicle cancer ([place])    In remission - followed by Dr. Risa Grill; had bilateral orchiectomy and XRT in past    Patient  Active Problem List   Diagnosis Date Noted  . Cardiomyopathy, ischemic 12/10/2015  . Essential hypertension 12/10/2015  . Chest pain with moderate risk for cardiac etiology 11/21/2015  . Hyperlipidemia LDL goal <70 06/25/2015  . Acute MI, anterolateral wall, subsequent episode of care ([place]) 04/20/2015  . Acute systolic congestive heart failure, NYHA class 2 ([place]) 01/16/2015  . Acute coronary syndrome ([place])   . ST elevation myocardial infarction (STEMI) of anterolateral wall ([place]) -- Delayed presentation, pain free on admission 01/14/2015  . Skin lesion 04/07/2012  . GOITER, MULTINODULAR 03/09/2010  . THYROID NODULE, RIGHT 03/06/2010  . DYSPHAGIA UNSPECIFIED 12/22/2009  . TESTICULAR CANCER 10/17/2009  . SHORTNESS OF BREATH 10/17/2009  . ALLERGY, FOOD 03/07/2009  . SKIN TAG 03/07/2009  . PALPITATIONS 08/21/2008  . ALLERGIC RHINITIS 08/12/2008  . COLONIC POLYPS, HX OF 08/12/2008  . Dyslipidemia 07/29/2008  . Hypertensive heart disease 07/29/2008  . GERD 07/29/2008  . CHEST PAIN, ATYPICAL 07/29/2008    Past Surgical History:  Procedure Laterality Date  . CARDIAC CATHETERIZATION N/A 01/15/2015   Procedure: Left Heart Cath and Coronary Angiography;  Surgeon: Troy Sine, MD;  Location: [facility];  Service: Cardiovascular;  Laterality: N/A;  . CARDIAC CATHETERIZATION  01/15/2015   Procedure: Coronary Stent Intervention;  Surgeon: Troy Sine, MD;  Location: [facility];  Service: Cardiovascular;;  . ORCHIECTOMY Bilateral        Home Medications    Prior to Admission medications   Medication Sig Start  Date End Date Taking? Authorizing Provider  aspirin EC 81 MG EC tablet Take 1 tablet (81 mg total) by mouth daily. 01/18/15  Yes Brett Canales, PA-C  atorvastatin (LIPITOR) 80 MG tablet TAKE 1 TABLET BY MOUTH DAILY AT 6 PM 04/05/16  Yes Troy Sine, MD  BRILINTA 90 MG TABS tablet TAKE 1 TABLET BY MOUTH TWICE A DAY 04/21/16  Yes Troy Sine, MD  carvedilol  (COREG) 25 MG tablet Take 1 tablet (25 mg total) by mouth 2 (two) times daily with a meal. 04/20/16 07/19/16 Yes Troy Sine, MD  EPIPEN 2-PAK 0.3 MG/0.3ML SOAJ injection INJECT 0.3 MLS INTO THE MUSCLE ONCE **PATIENT NEEDS TO SCHEDULE AN OFFICE VISIT** 06/12/13  Yes Doe-Hyun Kyra Searles, DO  losartan (COZAAR) 25 MG tablet Take 25 mg by mouth daily.   Yes Historical Provider, MD  nitroGLYCERIN (NITROSTAT) 0.4 MG SL tablet Place 1 tablet (0.4 mg total) under the tongue every 5 (five) minutes x 3 doses as needed for chest pain. 01/18/15  Yes Brett Canales, PA-C  Omega-3 Fatty Acids (FISH OIL) 1000 MG CAPS Take 1 capsule by mouth daily.   Yes Historical Provider, MD  Testosterone (ANDROGEL PUMP) 20.25 MG/ACT (1.62%) GEL Place 5 Squirts onto the skin daily.   Yes Historical Provider, MD    Family History Family History  Problem Relation Age of Onset  . Heart attack Father     CABG at agae 44  . Stroke Paternal Grandfather   . Hypertension    . Hyperlipidemia    . Heart attack Maternal Grandmother     Social History Social History  Substance Use Topics  . Smoking status: Former Research scientist (life sciences)  . Smokeless tobacco: Never Used     Comment: quit around 2010  . Alcohol use 0.0 oz/week     Comment: occasionally     Allergies   Shellfish allergy; Iodine; Penicillins; and Ace inhibitors   Review of Systems Review of Systems  Constitutional:       Per HPI, otherwise negative  HENT:       Per HPI, otherwise negative  Respiratory:       Per HPI, otherwise negative  Cardiovascular:       Per HPI, otherwise negative  Gastrointestinal: Negative for vomiting.  Endocrine:       Negative aside from HPI  Genitourinary:       Neg aside from HPI   Musculoskeletal:       Per HPI, otherwise negative  Skin: Negative.   Neurological: Positive for light-headedness. Negative for syncope.     Physical Exam Updated Vital Signs BP 123/88   Pulse 72   Temp 98.2 F (36.8 C) (Oral)   Resp 17   Ht 6'  (1.829 m)   Wt 205 lb (93 kg)   SpO2 97%   BMI 27.80 kg/m   Physical Exam  Constitutional: He is oriented to person, place, and time. He appears well-developed. No distress.  HENT:  Head: Normocephalic and atraumatic.  Eyes: Conjunctivae and EOM are normal.  Cardiovascular: Normal rate and regular rhythm.   Pulmonary/Chest: Effort normal. No stridor. No respiratory distress.  No tenderness to palpation about the anterior chest wall, sternum, no pain with bilateral shoulder adduction  Abdominal: He exhibits no distension.  Musculoskeletal: He exhibits no edema.  Neurological: He is alert and oriented to person, place, and time.  Skin: Skin is warm and dry.  Psychiatric: He has a normal mood and affect.  Nursing note  and vitals reviewed.    ED Treatments / Results  Labs (all labs ordered are listed, but only abnormal results are displayed) Labs Reviewed  BASIC METABOLIC PANEL - Abnormal; Notable for the following:       Result Value   Glucose, Bld 104 (*)    Calcium 8.7 (*)    All other components within normal limits  CBC WITH DIFFERENTIAL/PLATELET  TROPONIN I  CK  I-STAT TROPOININ, ED    EKG  EKG Interpretation  Date/Time:  Saturday May 15 2016 06:16:40 EDT Ventricular Rate:  77 PR Interval:    QRS Duration: 98 QT Interval:  359 QTC Calculation: 407 R Axis:   -8 Text Interpretation:  Sinus rhythm q waves anteriorly T wave abnormality Artifact Abnormal ekg Confirmed by Carmin Muskrat  MD ([id number]) on 05/15/2016 7:16:06 AM       Radiology Dg Chest 2 View  Result Date: 05/15/2016 CLINICAL DATA:  Chest pain. EXAM: CHEST  2 VIEW COMPARISON:  November 21, 2015 FINDINGS: No pneumothorax. The heart, hila, and mediastinum are unchanged. No pulmonary nodules or masses. No focal infiltrates. IMPRESSION: No active cardiopulmonary disease. Electronically Signed   By: Dorise Bullion III M.D   On: 05/15/2016 07:45    Procedures Procedures   Chart review notable for  history of ACS, requiring intervention, with last echocardiogram demonstrating ejection fraction 45%.  9:24 AM On repeat exam the patient is calm, in no distress per I discussed all findings with him and his wife, including 2 negative troponins, unremarkable EKG, review of history, including similar presentation in November, 2017. I have discussed the patient's labs, presentation with our cardiology colleagues in an attempt to expedite outpatient follow-up, though with no evidence for ACS, no distress, no evidence for dissection, or other acute new pathology, the patient is appropriate for discharge with outpatient follow-up.  Given the patient's description of back pain, anterior pain, there suspicion for either indigestion or musculoskeletal etiology.  Initial Impression / Assessment and Plan / ED Course  I have reviewed the triage vital signs and the nursing notes.  Pertinent labs & imaging results that were available during my care of the patient were reviewed by me and considered in my medical decision making (see chart for details).    Final Clinical Impressions(s) / ED Diagnoses   Final diagnoses:  Atypical chest pain     Carmin Muskrat, MD 05/15/16 [phone]

## 2016-05-15 NOTE — Discharge Instructions (Signed)
As discussed, your evaluation today has been largely reassuring.  But, it is important that you monitor your condition carefully, and do not hesitate to return to the ED if you develop new, or concerning changes in your condition. ? ?Otherwise, please follow-up with your physician for appropriate ongoing care. ? ?

## 2016-05-15 NOTE — ED Triage Notes (Signed)
Pt presents to ED from home with chest pain radiating to R shoulder and back.  Pain started last weekend.  Pt was moving boxes into storage and thought the pain was musculoskeletal, but he has an extensive cardiac history and felt like he needed to be seen since it didn't go away.

## 2016-07-01 ENCOUNTER — Other Ambulatory Visit: Payer: Self-pay | Admitting: Cardiovascular Disease

## 2016-07-02 NOTE — Telephone Encounter (Signed)
REFILL 

## 2016-07-09 ENCOUNTER — Telehealth: Payer: Self-pay | Admitting: Cardiovascular Disease

## 2016-07-09 DIAGNOSIS — I2510 Atherosclerotic heart disease of native coronary artery without angina pectoris: Secondary | ICD-10-CM

## 2016-07-09 MED ORDER — CARVEDILOL 25 MG PO TABS: 25.0000 mg | ORAL_TABLET | Freq: Two times a day (BID) | ORAL | 3 refills | Status: DC

## 2016-07-09 MED ORDER — ATORVASTATIN CALCIUM 80 MG PO TABS: ORAL_TABLET | ORAL | 3 refills | Status: DC

## 2016-07-09 MED ORDER — LOSARTAN POTASSIUM 25 MG PO TABS: 25.0000 mg | ORAL_TABLET | Freq: Every day | ORAL | 3 refills | Status: DC

## 2016-07-09 MED ORDER — TICAGRELOR 90 MG PO TABS
90.0000 mg | ORAL_TABLET | Freq: Two times a day (BID) | ORAL | 3 refills | Status: DC
Start: 2016-07-09 — End: 2017-03-07

## 2016-07-09 NOTE — Telephone Encounter (Signed)
Refill sent to the pharmacy electronically.  

## 2016-07-09 NOTE — Telephone Encounter (Signed)
New Message     Pt needs to change pharmacy for his medications below, send new prescription to 90 days supplies for all of them  [facility] - on [address] in [place]   losartan (COZAAR) 25 MG tablet Take 25 mg by mouth daily.   atorvastatin (LIPITOR) 80 MG tablet TAKE 1 TABLET BY MOUTH DAILY AT 6 PM    BRILINTA 90 MG TABS tablet TAKE 1 TABLET BY MOUTH TWICE A DAY   carvedilol (COREG) 25 MG tablet Take 1 tablet (25 mg total) by mouth 2 (two) times daily with a meal.

## 2017-01-07 IMAGING — DX DG CHEST 2V
2 series · 2 of 2 positions shown · non-contrast
Comparison: PA and lateral chest 01/23/2015.

CLINICAL DATA: Chest pain today.

EXAM:
CHEST  2 VIEW

[chest pa]
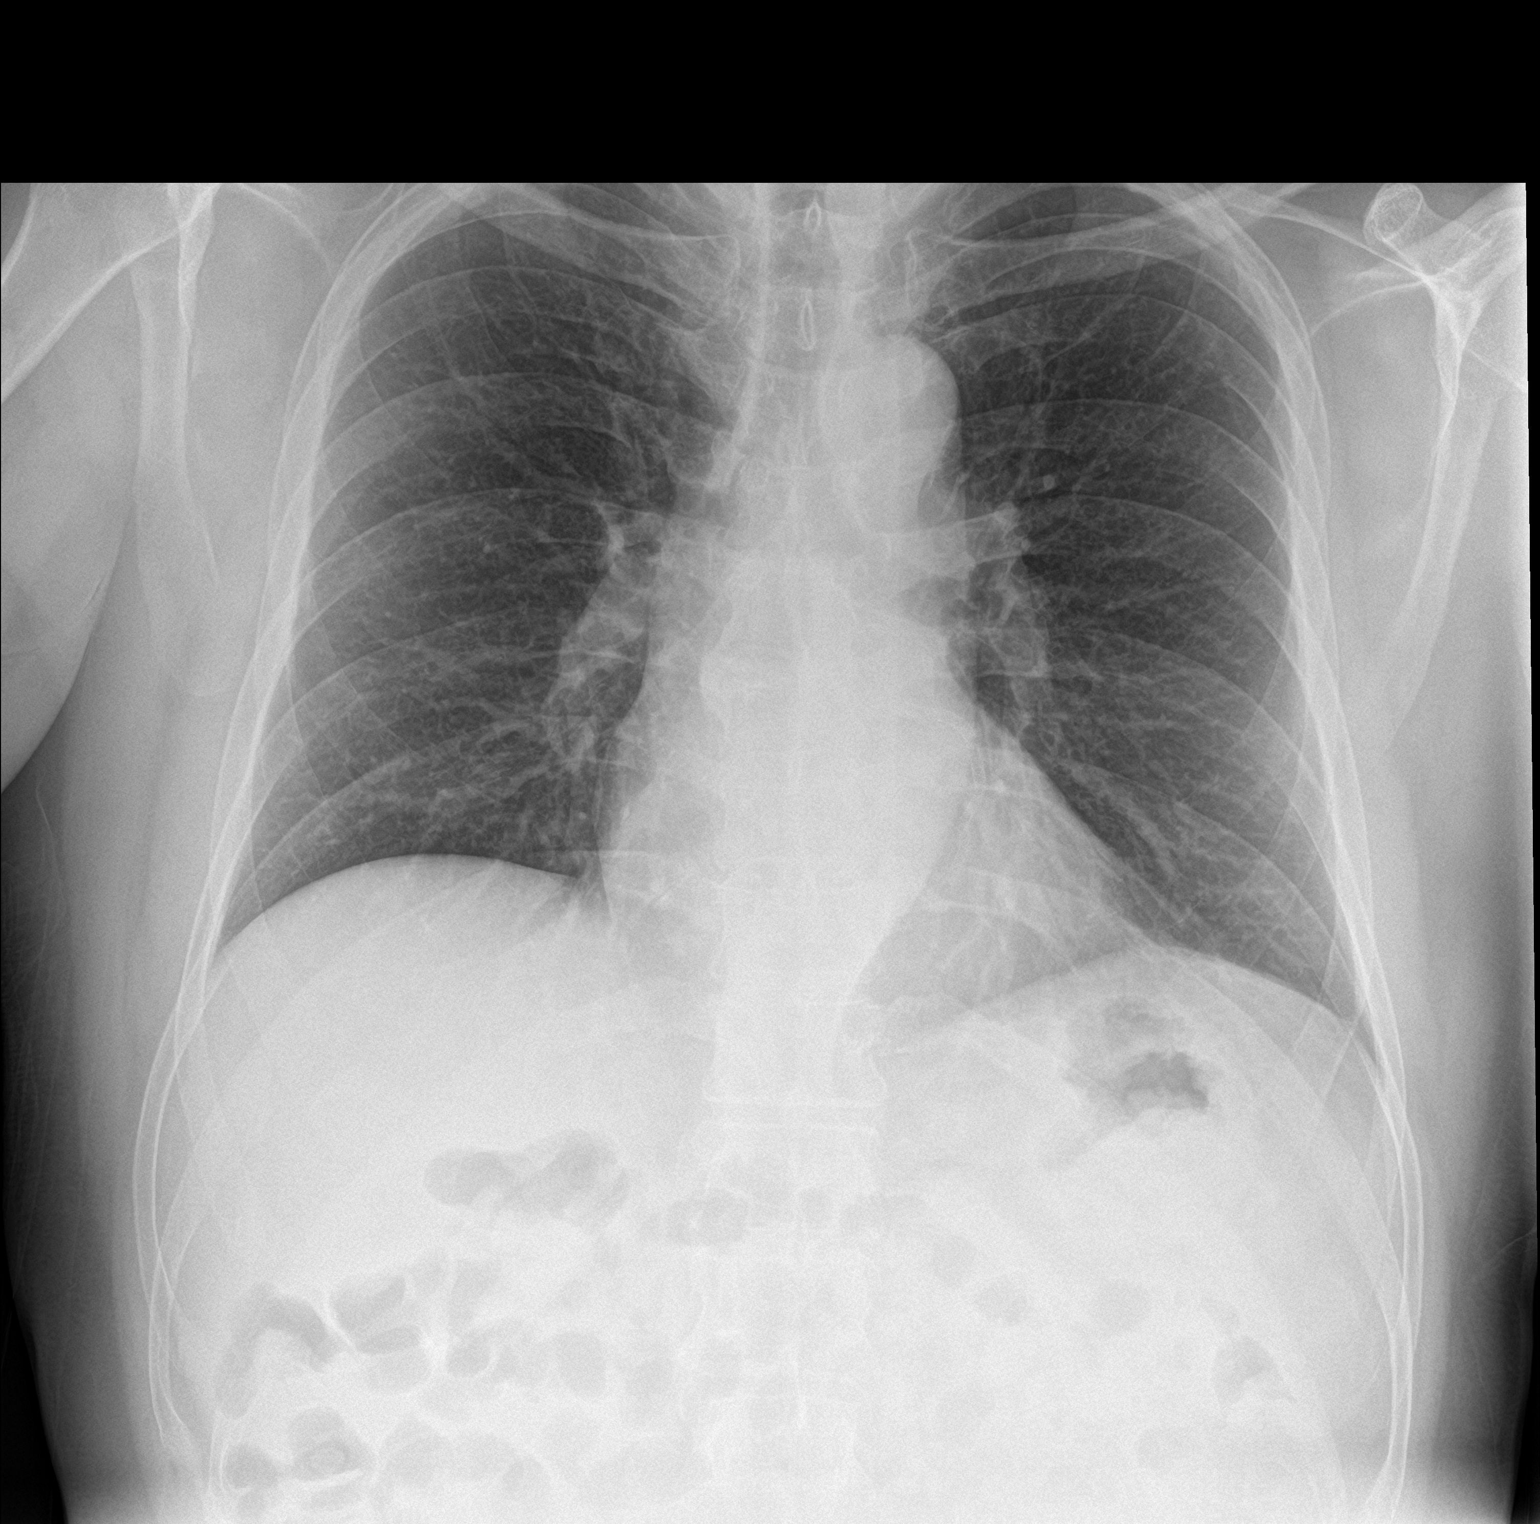

[chest lat]
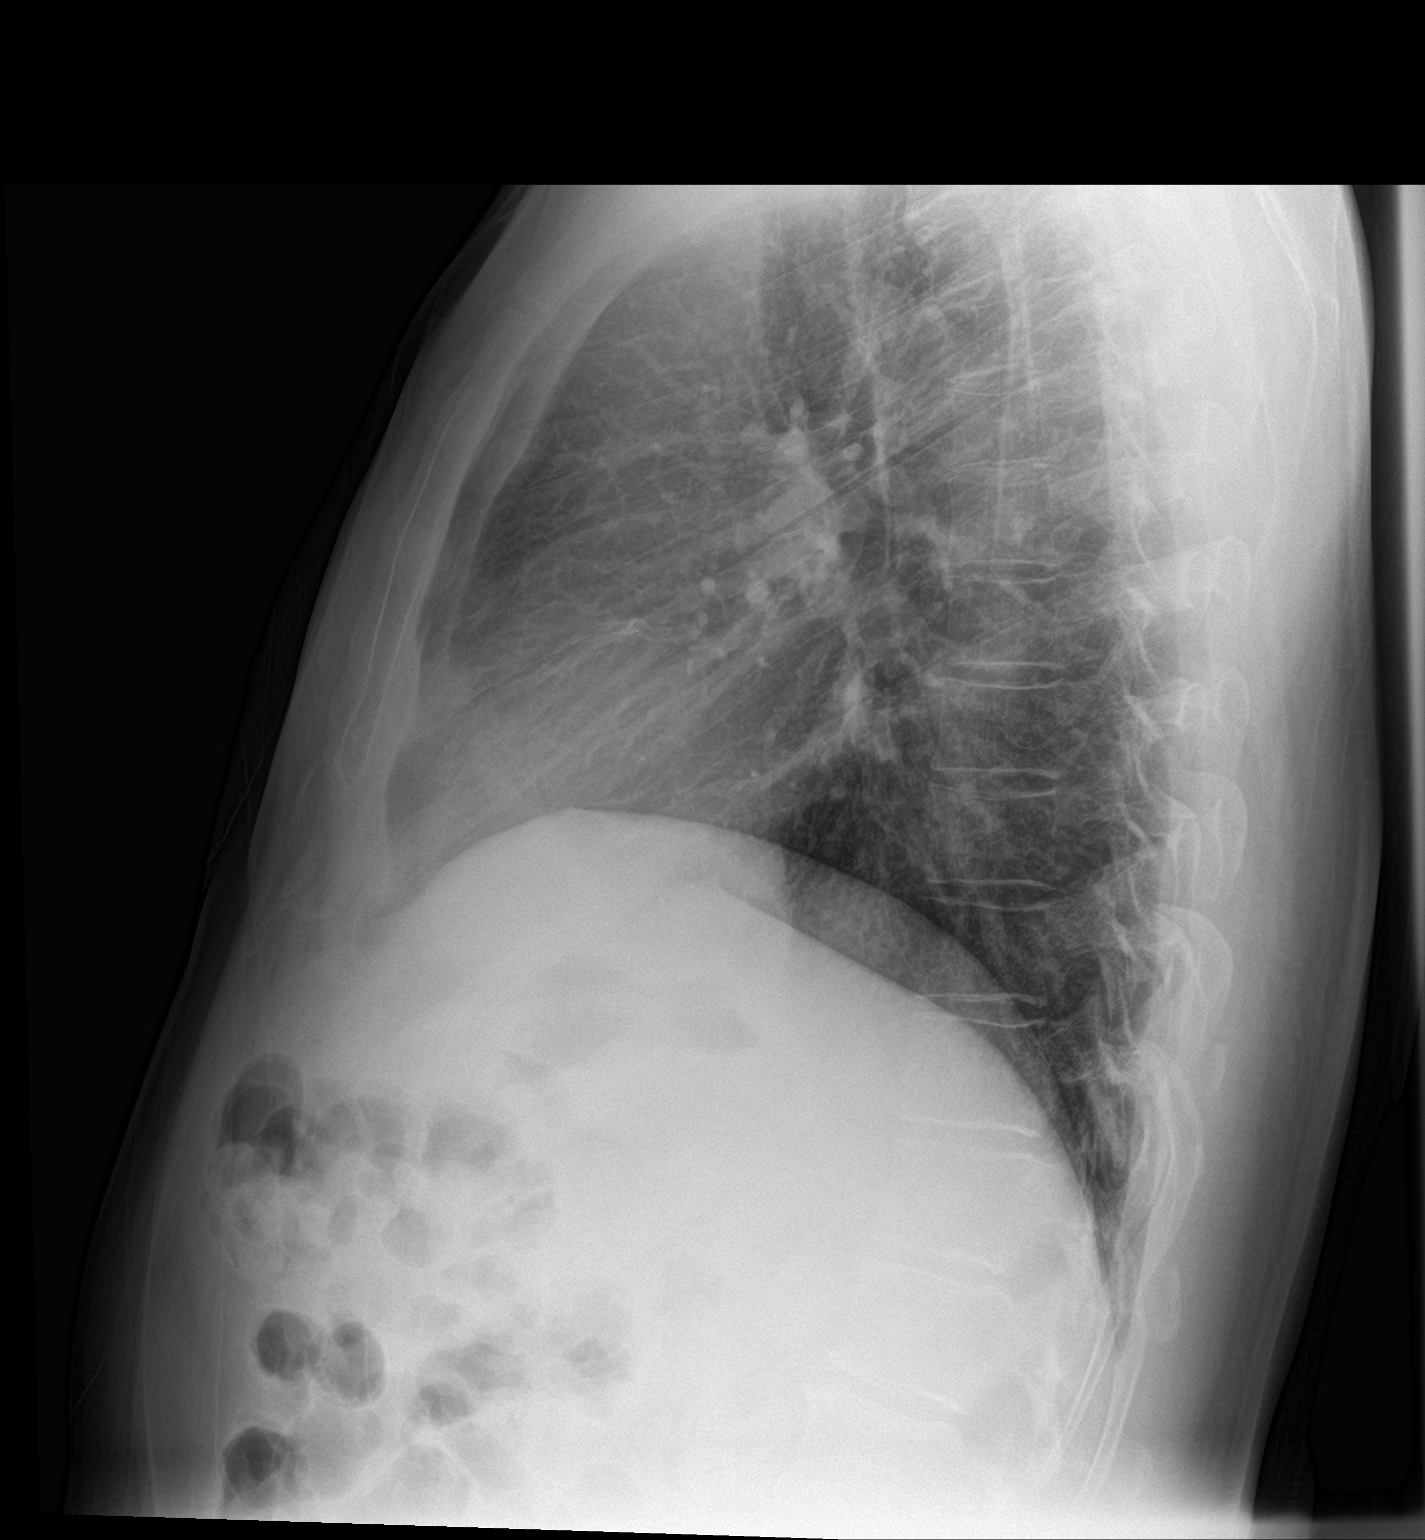

[2 of 2 positions shown; findings below may reference images not displayed]

FINDINGS: The lungs are clear. Heart size normal. No pneumothorax or pleural
effusion. No focal bony abnormality.
IMPRESSION: Negative chest.

## 2017-02-09 ENCOUNTER — Encounter (HOSPITAL_COMMUNITY): Payer: Self-pay | Admitting: Emergency Medicine

## 2017-02-09 ENCOUNTER — Emergency Department (HOSPITAL_COMMUNITY): Payer: BLUE CROSS/BLUE SHIELD

## 2017-02-09 ENCOUNTER — Other Ambulatory Visit: Payer: Self-pay

## 2017-02-09 ENCOUNTER — Emergency Department (HOSPITAL_COMMUNITY)
Admission: EM | Admit: 2017-02-09 | Discharge: 2017-02-09 | Disposition: A | Discharge status: Home / Self Care | Payer: BLUE CROSS/BLUE SHIELD | Attending: Emergency Medicine | Admitting: Emergency Medicine

## 2017-02-09 DIAGNOSIS — Z87891 Personal history of nicotine dependence: Secondary | ICD-10-CM | POA: Insufficient documentation

## 2017-02-09 DIAGNOSIS — Z7982 Long term (current) use of aspirin: Secondary | ICD-10-CM | POA: Insufficient documentation

## 2017-02-09 DIAGNOSIS — I2510 Atherosclerotic heart disease of native coronary artery without angina pectoris: Secondary | ICD-10-CM | POA: Insufficient documentation

## 2017-02-09 DIAGNOSIS — Z79899 Other long term (current) drug therapy: Secondary | ICD-10-CM | POA: Insufficient documentation

## 2017-02-09 DIAGNOSIS — M546 Pain in thoracic spine: Secondary | ICD-10-CM | POA: Diagnosis not present

## 2017-02-09 DIAGNOSIS — M79602 Pain in left arm: Secondary | ICD-10-CM | POA: Insufficient documentation

## 2017-02-09 DIAGNOSIS — R079 Chest pain, unspecified: Secondary | ICD-10-CM

## 2017-02-09 DIAGNOSIS — I10 Essential (primary) hypertension: Secondary | ICD-10-CM | POA: Insufficient documentation

## 2017-02-09 DIAGNOSIS — M79601 Pain in right arm: Secondary | ICD-10-CM | POA: Insufficient documentation

## 2017-02-09 LAB — CBC
HEMATOCRIT: 43.2 % (ref 39.0–52.0)
Hemoglobin: 14.7 g/dL (ref 13.0–17.0)
MCH: 30.1 pg (ref 26.0–34.0)
MCHC: 34 g/dL (ref 30.0–36.0)
MCV: 88.5 fL (ref 78.0–100.0)
PLATELETS: 175 10*3/uL (ref 150–400)
RBC: 4.88 MIL/uL (ref 4.22–5.81)
RDW: 13 % (ref 11.5–15.5)
WBC: 9.4 10*3/uL (ref 4.0–10.5)

## 2017-02-09 LAB — BASIC METABOLIC PANEL
Anion gap: 9 (ref 5–15)
BUN: 14 mg/dL (ref 6–20)
CALCIUM: 9 mg/dL (ref 8.9–10.3)
CO2: 23 mmol/L (ref 22–32)
CREATININE: 0.83 mg/dL (ref 0.61–1.24)
Chloride: 105 mmol/L (ref 101–111)
GFR calc Af Amer: >60 mL/min (ref >60)
Glucose, Bld: 140 mg/dL — ABNORMAL HIGH (ref 65–99)
POTASSIUM: 3.6 mmol/L (ref 3.5–5.1)
SODIUM: 137 mmol/L (ref 135–145)

## 2017-02-09 LAB — I-STAT TROPONIN, ED
TROPONIN I, POC: 0.01 ng/mL (ref 0.00–0.08)
TROPONIN I, POC: 0.01 ng/mL (ref 0.00–0.08)

## 2017-02-09 MED ORDER — GI COCKTAIL ~~LOC~~
30.0000 mL | Freq: Once | ORAL | Status: AC
Start: 2017-02-09 — End: 2017-02-09
  Administered 2017-02-09: 30 mL via ORAL
  Filled 2017-02-09: qty 30

## 2017-02-09 NOTE — Discharge Instructions (Signed)
Please call your cardiologist for a follow-up appointment in the next 1-2 days.  If your chest pain worsens, moves up your neck, worsens in your left arm, you experience shortness of breath or any other concerning symptoms please return to the emergency room.

## 2017-02-09 NOTE — ED Provider Notes (Signed)
[facility] EMERGENCY DEPARTMENT Provider Note   CSN: [id number] Arrival date & time: 02/09/17  1713     History   Chief Complaint Chief Complaint  Patient presents with  . Chest Pain  . Arm Pain    HPI Cesar Woods is a 52 y.o. male with a history of CAD status post STEMI 2 years ago requiring placement of for DES to LAD, hypertension, ischemic cardiomyopathy, testicular cancer who presents today for evaluation of 2 days of chest pain.  He reports that his pain is across the front of his chest and feels tight.  His pain is intermittent in nature He reports that he has not found anything to make his pain better or worse.  He reports compliance with all his medications.  He denies any shortness of breath.  He reports around that he has pain in the backs of his upper arms.  He denies any new activities that could have caused this pain.  No fevers or chills.  No neck pain.    HPI  Past Medical History:  Diagnosis Date  . ACS (acute coronary syndrome) ([place]) 11/2015  . Atypical chest pain    Negative Myoview 2010  . Coronary artery disease    a.s/p STEMI in 01/2015 requiring placement of 4 DES to LAD. b. 11/2015: NST showing apical scar with minimal peri-infarct ischemia.  . Essential hypertension   . Food allergy    Anaphylaxis with shell fish  . GERD (gastroesophageal reflux disease)   . History of migraines   . Hyperlipidemia   . Ischemic cardiomyopathy    a. Echo 05/2015: EF 40-45%, apical akineiss with Grade 1 DD.  [name] Myocardial infarction ([place]) 2016  . Testicle cancer ([place])    In remission - followed by Dr. Risa Grill; had bilateral orchiectomy and XRT in past    Patient Active Problem List   Diagnosis Date Noted  . Cardiomyopathy, ischemic 12/10/2015  . Essential hypertension 12/10/2015  . Chest pain with moderate risk for cardiac etiology 11/21/2015  . Hyperlipidemia LDL goal <70 06/25/2015  . Acute MI, anterolateral wall, subsequent episode of care  ([place]) 04/20/2015  . Acute systolic congestive heart failure, NYHA class 2 ([place]) 01/16/2015  . Acute coronary syndrome ([place])   . ST elevation myocardial infarction (STEMI) of anterolateral wall ([place]) -- Delayed presentation, pain free on admission 01/14/2015  . Skin lesion 04/07/2012  . GOITER, MULTINODULAR 03/09/2010  . THYROID NODULE, RIGHT 03/06/2010  . DYSPHAGIA UNSPECIFIED 12/22/2009  . TESTICULAR CANCER 10/17/2009  . SHORTNESS OF BREATH 10/17/2009  . ALLERGY, FOOD 03/07/2009  . SKIN TAG 03/07/2009  . PALPITATIONS 08/21/2008  . ALLERGIC RHINITIS 08/12/2008  . COLONIC POLYPS, HX OF 08/12/2008  . Dyslipidemia 07/29/2008  . Hypertensive heart disease 07/29/2008  . GERD 07/29/2008  . CHEST PAIN, ATYPICAL 07/29/2008    Past Surgical History:  Procedure Laterality Date  . CARDIAC CATHETERIZATION N/A 01/15/2015   Procedure: Left Heart Cath and Coronary Angiography;  Surgeon: Troy Sine, MD;  Location: [facility];  Service: Cardiovascular;  Laterality: N/A;  . CARDIAC CATHETERIZATION  01/15/2015   Procedure: Coronary Stent Intervention;  Surgeon: Troy Sine, MD;  Location: [facility];  Service: Cardiovascular;;  . ORCHIECTOMY Bilateral        Home Medications    Prior to Admission medications   Medication Sig Start Date End Date Taking? Authorizing Provider  aspirin EC 81 MG EC tablet Take 1 tablet (81 mg total) by mouth  daily. 01/18/15  Yes Brett Canales, PA-C  atorvastatin (LIPITOR) 80 MG tablet TAKE 1 TABLET BY MOUTH DAILY AT 6 PM 07/09/16  Yes Troy Sine, MD  carvedilol (COREG) 25 MG tablet Take 1 tablet (25 mg total) by mouth 2 (two) times daily with a meal. 07/09/16 02/09/17 Yes Troy Sine, MD  EPIPEN 2-PAK 0.3 MG/0.3ML SOAJ injection INJECT 0.3 MLS INTO THE MUSCLE ONCE **PATIENT NEEDS TO SCHEDULE AN OFFICE VISIT** 06/12/13  Yes Yoo, Doe-Hyun R, DO  losartan (COZAAR) 25 MG tablet Take 1 tablet (25 mg total) by mouth daily. 07/09/16  Yes Troy Sine, MD  nitroGLYCERIN (NITROSTAT) 0.4 MG SL tablet Place 1 tablet (0.4 mg total) under the tongue every 5 (five) minutes x 3 doses as needed for chest pain. 01/18/15  Yes Brett Canales, PA-C  Omega-3 Fatty Acids (FISH OIL) 1000 MG CAPS Take 1 capsule by mouth daily.   Yes [provider]  Testosterone (ANDROGEL PUMP) 20.25 MG/ACT (1.62%) GEL Place 5 Squirts onto the skin daily.   Yes [provider]  ticagrelor (BRILINTA) 90 MG TABS tablet Take 1 tablet (90 mg total) by mouth 2 (two) times daily. 07/09/16  Yes Troy Sine, MD    Family History Family History  Problem Relation Age of Onset  . Heart attack Father        CABG at agae 73  . Stroke Paternal Grandfather   . Hypertension Unknown   . Hyperlipidemia Unknown   . Heart attack Maternal Grandmother     Social History Social History   Tobacco Use  . Smoking status: Former Research scientist (life sciences)  . Smokeless tobacco: Never Used  . Tobacco comment: quit around 2010  Substance Use Topics  . Alcohol use: Yes    Alcohol/week: 0.0 oz    Comment: occasionally  . Drug use: No    Comment: remote use of marijuana, quit a long time ago     Allergies   Shellfish allergy; Iodine; Penicillins; and Ace inhibitors   Review of Systems Review of Systems  Constitutional: Negative for chills and fever.  HENT: Negative for ear pain and sore throat.   Eyes: Negative for pain and visual disturbance.  Respiratory: Negative for cough, chest tightness and shortness of breath.   Cardiovascular: Positive for chest pain. Negative for palpitations and leg swelling.  Gastrointestinal: Negative for abdominal pain and vomiting.  Genitourinary: Negative for dysuria and hematuria.  Musculoskeletal: Negative for arthralgias and back pain.       Pain in upper back arms bilaterally.   Skin: Negative for color change and rash.  Neurological: Negative for seizures, syncope and headaches.  All other systems reviewed and are negative.    Physical  Exam Updated Vital Signs BP (!) 130/91   Pulse 64   Temp 98.4 F (36.9 C) (Oral)   Resp 16   Ht 6' (1.829 m)   Wt 93 kg (205 lb)   SpO2 96%   BMI 27.80 kg/m   Physical Exam  Constitutional: He appears well-developed and well-nourished.  Non-toxic appearance. He does not appear ill.  HENT:  Head: Normocephalic and atraumatic.  Eyes: Conjunctivae are normal.  Neck: Normal range of motion. Neck supple. No JVD present. No tracheal deviation present.  Cardiovascular: Normal rate and regular rhythm.  No murmur heard. Pulses:      Radial pulses are 2+ on the right side, and 2+ on the left side.       Dorsalis pedis pulses are  2+ on the right side, and 2+ on the left side.       Posterior tibial pulses are 2+ on the right side, and 2+ on the left side.  Pulmonary/Chest: Effort normal and breath sounds normal. No respiratory distress. He has no decreased breath sounds.  Abdominal: Soft. There is no tenderness.  Musculoskeletal: He exhibits no edema.       Right lower leg: Normal.       Left lower leg: Normal.  Palpation over bilateral trapezius muscles does not re-create or exacerbate his pain.   Neurological: He is alert.  Skin: Skin is warm and dry.  Psychiatric: He has a normal mood and affect.  Nursing note and vitals reviewed.    ED Treatments / Results  Labs (all labs ordered are listed, but only abnormal results are displayed) Labs Reviewed  BASIC METABOLIC PANEL - Abnormal; Notable for the following components:      Result Value   Glucose, Bld 140 (*)    All other components within normal limits  CBC  I-STAT TROPONIN, ED  I-STAT TROPONIN, ED  I-STAT TROPONIN, ED    EKG  EKG Interpretation  Date/Time:  Wednesday February 09 2017 18:40:09 EST Ventricular Rate:  72 PR Interval:    QRS Duration: 95 QT Interval:  369 QTC Calculation: 404 R Axis:   113 Text Interpretation:  Sinus rhythm Consider left atrial enlargement Lateral infarct, recent Probable  anteroseptal infarct, old No significant change since last tracing Confirmed by Wandra Arthurs [phone]) on 02/09/2017 7:43:54 PM       Radiology Dg Chest 2 View  Result Date: 02/09/2017 CLINICAL DATA:  Pt c/o left-sided chest pain and SOB x 2 days. Hx of CAD, ischemic cardiomyopathy, AND MI. Pt is a former smoker. EXAM: CHEST  2 VIEW COMPARISON:  Chest x-ray dated 05/15/2016. FINDINGS: Heart size and mediastinal contours are within normal limits. Lungs are clear. No pleural effusion or pneumothorax seen. No acute or suspicious osseous finding. IMPRESSION: No active cardiopulmonary disease. No evidence of pneumonia or pulmonary edema. Electronically Signed   By: Franki Cabot M.D.   On: 02/09/2017 18:20    Procedures Procedures (including critical care time)  Medications Ordered in ED Medications  gi cocktail (Maalox,Lidocaine,Donnatal) (30 mLs Oral Given 02/09/17 1921)     Initial Impression / Assessment and Plan / ED Course  I have reviewed the triage vital signs and the nursing notes.  Pertinent labs & imaging results that were available during my care of the patient were reviewed by me and considered in my medical decision making (see chart for details).    Patient is to be discharged with recommendation to follow up with PCP in regards to today's hospital visit. Chest pain is not likely of cardiac or pulmonary etiology d/t presentation,  VSS, no tracheal deviation, no JVD or new murmur, RRR, breath sounds equal bilaterally, EKG without acute abnormalities, negative troponin, and negative CXR.  While unable to apply PERC criteria due to patient age of 61, I am not suspicious for a PE based on not tachycardic, no hypoxia, chest pain is not pleuritic in nature, Wells DVT criteria negative, and compliance with Brilinta.  Pt has been advised to return to the ED if CP becomes exertional, associated with diaphoresis or nausea, radiates to left jaw/arm, worsens or becomes concerning in any way. Pt  appears reliable for follow up and is agreeable to discharge.   Case has been discussed with  Dr. Darl Householder who agrees with  the above plan to discharge.    Final Clinical Impressions(s) / ED Diagnoses   Final diagnoses:  Chest pain, unspecified type  Bilateral arm pain    ED Discharge Orders    None       Ollen Gross 02/10/17 0256    Drenda Freeze, MD 02/12/17 [phone]

## 2017-02-09 NOTE — ED Triage Notes (Signed)
Pt. Stated, Ive had chest pain for 2 days and what is concerning is the pain in both arms for a couple of seconds and it will be fine. Ive had a a heart attack before.

## 2017-02-14 NOTE — Progress Notes (Signed)
Cardiology Office Note   Date:  02/15/2017   ID:  Cesar Woods, DOB [birth year], MRN [id number]  PCP:  Delilah Shan, MD  Cardiologist:   [name] MD Chief Complaint  Patient presents with  . Coronary Artery Disease  . Hypertension  . Hyperlipidemia     History of Present Illness: Cesar Woods is a 52 y.o. male who presents for ongoing assessment and management of hypertension, known history of anaphylaxis allergy to shellfish, coronary artery disease, status post catheterization in 12 of 2016 revealing significant 90% ostial LAD stenosis followed by 99% proximal LAD stenosis in the region of the first septal perforating artery with significant thrombus.  The patient also had diffuse 50% stenosis followed by 95% ulcerated plaque in the proximal mid LAD.  There was also found to have an apical LAD dissection.  There was other areas of CAD with a 40% ramus intermittent, likely high marginal stenosis and a large dominant RCA with 30% stenosis.  The large PDA and PLA vessels with septal collateralization to the proximal LAD.  The patient underwent a very difficult but successful intervention to his diffusely diseased LAD with insertion of 3 drug-eluting stents from the mid LAD to the ostium and PTCA of the distal LAD with low-level inflation tach of his apical initial dissection.  He was noted to have total occlusion of the diagonal vessel antegrade but there was significant development of retrograde collaterals to his diagonal vessel from the distal LAD.  The patient was last seen by [name] on 04/10/2016, his carvedilol was further titrated up to 25 mg twice daily.  Repeat echocardiogram revealed reduced LV systolic function of 24% to 45% with apical akinesis.  He was continued on dual antiplatelet therapy with Brilinta and aspirin.  He was continued on statin therapy with target LDL less than 70.  Is being seen last, the patient has been having some intermittent chest pressure with  some pain underneath both arms in the triceps area.  This occurs at rest.  The patient admits to gaining at least 25 pounds since April of this past year he stopped exercising and eating right.  He has been having more fatigue with the weight gain, some shortness of breath.  He chalked it up to that.  However over the last couple of months he has been having intermittent chest pressure which has become concerning to him the patient has been medically compliant.  Over the last couple of weeks he has started exercising walking on a treadmill, and eating better.  While walking on the treadmill he has not had any recurrent discomfort but his breathing status has been noticeably shorter.  Again he chalks it up to being out of shape.  He denies any associated diaphoresis nausea or vomiting associated with chest discomfort.  He was seen in the emergency room on 02/09/2017 for recurrent chest pain.  Given GI cocktail.  He has had labs which were negative for ACS, along with EKG unchanged from prior EKG revealing old anterior infarct.   Past Medical History:  Diagnosis Date  . ACS (acute coronary syndrome) ([place]) 11/2015  . Atypical chest pain    Negative Myoview 2010  . Coronary artery disease    a.s/p STEMI in 01/2015 requiring placement of 4 DES to LAD. b. 11/2015: NST showing apical scar with minimal peri-infarct ischemia.  . Essential hypertension   . Food allergy    Anaphylaxis with shell fish  . GERD (gastroesophageal reflux disease)   .  History of migraines   . Hyperlipidemia   . Ischemic cardiomyopathy    a. Echo 05/2015: EF 40-45%, apical akineiss with Grade 1 DD.  [name] Myocardial infarction ([place]) 2016  . Testicle cancer ([place])    In remission - followed by Dr. Risa Grill; had bilateral orchiectomy and XRT in past    Past Surgical History:  Procedure Laterality Date  . CARDIAC CATHETERIZATION N/A 01/15/2015   Procedure: Left Heart Cath and Coronary Angiography;  Surgeon: Troy Sine, MD;   Location: [facility];  Service: Cardiovascular;  Laterality: N/A;  . CARDIAC CATHETERIZATION  01/15/2015   Procedure: Coronary Stent Intervention;  Surgeon: Troy Sine, MD;  Location: [facility];  Service: Cardiovascular;;  . ORCHIECTOMY Bilateral      Current Outpatient Medications  Medication Sig Dispense Refill  . aspirin EC 81 MG EC tablet Take 1 tablet (81 mg total) by mouth daily.    [name] atorvastatin (LIPITOR) 80 MG tablet TAKE 1 TABLET BY MOUTH DAILY AT 6 PM 90 tablet 3  . carvedilol (COREG) 25 MG tablet Take 25 mg by mouth 2 (two) times daily with a meal.    . EPIPEN 2-PAK 0.3 MG/0.3ML SOAJ injection INJECT 0.3 MLS INTO THE MUSCLE ONCE **PATIENT NEEDS TO SCHEDULE AN OFFICE VISIT** 2 Device 0  . losartan (COZAAR) 25 MG tablet Take 1 tablet (25 mg total) by mouth daily. 90 tablet 3  . nitroGLYCERIN (NITROSTAT) 0.4 MG SL tablet Place 1 tablet (0.4 mg total) under the tongue every 5 (five) minutes x 3 doses as needed for chest pain. 25 tablet 12  . Omega-3 Fatty Acids (FISH OIL) 1000 MG CAPS Take 1 capsule by mouth daily.    . Testosterone (ANDROGEL PUMP) 20.25 MG/ACT (1.62%) GEL Place 5 Squirts onto the skin daily.    . ticagrelor (BRILINTA) 90 MG TABS tablet Take 1 tablet (90 mg total) by mouth 2 (two) times daily. 180 tablet 3   No current facility-administered medications for this visit.     Allergies:   Shellfish allergy; Iodine; Penicillins; and Ace inhibitors    Social History:  The patient  reports that he has quit smoking. he has never used smokeless tobacco. He reports that he drinks alcohol. He reports that he does not use drugs.   Family History:  The patient's family history includes Heart attack in his father and maternal grandmother; Hyperlipidemia in his unknown relative; Hypertension in his unknown relative; Stroke in his paternal grandfather.    ROS: All other systems are reviewed and negative. Unless otherwise mentioned in H&P    PHYSICAL  EXAM: VS:  BP 123/80   Pulse 85   Ht 6' (1.829 m)   Wt 209 lb (94.8 kg)   SpO2 97%   BMI 28.35 kg/m  , BMI Body mass index is 28.35 kg/m. GEN: Well nourished, well developed, in no acute distress  HEENT: normal  Neck: no JVD, carotid bruits, or masses Cardiac: RRR; no murmurs, rubs, or gallops,no edema  Respiratory:  clear to auscultation bilaterally, normal work of breathing GI: soft, nontender, nondistended, + BS MS: no deformity or atrophy  Skin: warm and dry, no rash Neuro:  Strength and sensation are intact Psych: euthymic mood, full affect   EKG: Normal sinus rhythm anterior infarct  Recent Labs: 02/09/2017: BUN 14; Creatinine, Ser 0.83; Hemoglobin 14.7; Platelets 175; Potassium 3.6; Sodium 137    Lipid Panel    Component Value Date/Time   CHOL 107 (L) 04/16/2015 1028  TRIG 172 (H) 04/16/2015 1028   HDL 24 (L) 04/16/2015 1028   CHOLHDL 4.5 04/16/2015 1028   VLDL 34 (H) 04/16/2015 1028   LDLCALC 49 04/16/2015 1028   LDLDIRECT 99.9 09/15/2012 1738      Wt Readings from Last 3 Encounters:  02/15/17 209 lb (94.8 kg)  02/09/17 205 lb (93 kg)  05/15/16 205 lb (93 kg)      Other studies Reviewed: Echocardiogram 2016/06/23 Left ventricle: The cavity size was normal. Wall thickness was   normal. Systolic function was mildly to moderately reduced. The   estimated ejection fraction was in the range of 40% to 45%. There   is akinesis of the apical myocardium. Doppler parameters are   consistent with abnormal left ventricular relaxation (grade 1   diastolic dysfunction). - Tricuspid valve: There was trivial regurgitation.  Impressions:  - Compared to the prior study, there has been no significant   interval change.   ASSESSMENT AND PLAN:  1.  CAD: 3 drug-eluting stents to the LAD per [name].  He is having recurrent chest discomfort, similar to the pain he had prior to stent placement to the LAD, although not as severe.  He was seen in the emergency room on  the second and was ruled out for ACS with unchanged EKG and negative troponin.  He was treated with a GI cocktail which did offer some relief.  But he has had recurrent discomfort since that time.  Discussed this with [name], concerning need to proceed with repeat catheterization versus never medicine stress test.  We are planning nuclear medicine stress test for evaluation of progression of coronary artery disease and ischemic burden.  This has been explained to the patient and he is willing to proceed.  He will hold metoprolol prior to the test to allow for better heart rate response to exercise.  He will continue ASA therapy with ASA and Brilinta.  2.  Hypercholesterolemia: The patient will continue statin therapy.  I will order fasting lipids and LFTs.  He has been n.p.o. this morning in anticipation of possible labs.  This will be completed today before he leaves.  3.  Hypertension: The patient will continue losartan and carvedilol as directed   Current medicines are reviewed at length with the patient today.    Labs/ tests ordered today include: Lipids and LFTs, BMET, and magnesium.  Phill Myron. West Pugh, ANP, AACC   02/15/2017 9:12 AM    [facility]  [address] [place], [place] [address] Phone: [phone]; Fax: [phone]

## 2017-02-15 ENCOUNTER — Encounter: Payer: Self-pay | Admitting: Adult Health

## 2017-02-15 ENCOUNTER — Ambulatory Visit: Payer: BLUE CROSS/BLUE SHIELD | Admitting: Adult Health

## 2017-02-15 VITALS — BP 123/80 | HR 85 | Ht 72.0 in | Wt 209.0 lb

## 2017-02-15 DIAGNOSIS — Z79899 Other long term (current) drug therapy: Secondary | ICD-10-CM | POA: Diagnosis not present

## 2017-02-15 DIAGNOSIS — I2510 Atherosclerotic heart disease of native coronary artery without angina pectoris: Secondary | ICD-10-CM

## 2017-02-15 DIAGNOSIS — R079 Chest pain, unspecified: Secondary | ICD-10-CM | POA: Diagnosis not present

## 2017-02-15 DIAGNOSIS — I10 Essential (primary) hypertension: Secondary | ICD-10-CM | POA: Diagnosis not present

## 2017-02-15 DIAGNOSIS — E7800 Pure hypercholesterolemia, unspecified: Secondary | ICD-10-CM | POA: Diagnosis not present

## 2017-02-15 DIAGNOSIS — I2109 ST elevation (STEMI) myocardial infarction involving other coronary artery of anterior wall: Secondary | ICD-10-CM

## 2017-02-15 LAB — BASIC METABOLIC PANEL
BUN/Creatinine Ratio: 23 — ABNORMAL HIGH (ref 9–20)
BUN: 19 mg/dL (ref 6–24)
CALCIUM: 9.1 mg/dL (ref 8.7–10.2)
CO2: 23 mmol/L (ref 20–29)
Chloride: 104 mmol/L (ref 96–106)
Creatinine, Ser: 0.84 mg/dL (ref 0.76–1.27)
GFR calc non Af Amer: 102 mL/min/{1.73_m2} (ref >59)
GFR, EST AFRICAN AMERICAN: 118 mL/min/{1.73_m2} (ref >59)
GLUCOSE: 99 mg/dL (ref 65–99)
POTASSIUM: 4.4 mmol/L (ref 3.5–5.2)
Sodium: 142 mmol/L (ref 134–144)

## 2017-02-15 LAB — LIPID PANEL
Chol/HDL Ratio: 2.7 ratio (ref 0.0–5.0)
Cholesterol, Total: 80 mg/dL — ABNORMAL LOW (ref 100–199)
HDL: 30 mg/dL — AB (ref >39)
LDL Calculated: 29 mg/dL (ref 0–99)
Triglycerides: 106 mg/dL (ref 0–149)
VLDL Cholesterol Cal: 21 mg/dL (ref 5–40)

## 2017-02-15 LAB — HEPATIC FUNCTION PANEL
ALBUMIN: 4.7 g/dL (ref 3.5–5.5)
ALK PHOS: 92 IU/L (ref 39–117)
ALT: 19 IU/L (ref 0–44)
AST: 17 IU/L (ref 0–40)
BILIRUBIN, DIRECT: 0.15 mg/dL (ref 0.00–0.40)
Bilirubin Total: 2.3 mg/dL — ABNORMAL HIGH (ref 0.0–1.2)
TOTAL PROTEIN: 7 g/dL (ref 6.0–8.5)

## 2017-02-15 LAB — MAGNESIUM: MAGNESIUM: 2 mg/dL (ref 1.6–2.3)

## 2017-02-15 NOTE — Patient Instructions (Signed)
Medication Instructions:  NO CHANGES-Your physician recommends that you continue on your current medications as directed. Please refer to the Current Medication list given to you today.  If you need a refill on your cardiac medications before your next appointment, please call your pharmacy.  Labwork: BMET, CBC, MAG AND LFT TODAY HERE IN OUR OFFICE AT [facility]  Testing/Procedures: Your physician has requested that you have a stress myoview-Exercise. A Myoview stress test is used to check for coronary disease. During the procedure, a small amount of radioactive isotope called Myoview is injected into a vein, one injection is given through an IV when you first begin the exam called resting images, and a second injection is given while you exercise on a treadmill. The Myoview circulates in your bloodstream and is absorbed by your heart while a special camera takes pictures that show whether your heart is receiving enough blood and oxygen.  Follow-Up: Your physician wants you to follow-up in: AFTER MYOVIEW.  Thank you for choosing [facility] at [facility]!!

## 2017-02-16 ENCOUNTER — Telehealth (HOSPITAL_COMMUNITY): Payer: Self-pay

## 2017-02-16 NOTE — Telephone Encounter (Signed)
Encounter complete. 

## 2017-02-18 ENCOUNTER — Ambulatory Visit (HOSPITAL_COMMUNITY)
Admission: RE | Admit: 2017-02-18 | Discharge: 2017-02-18 | Disposition: A | Discharge status: Home / Self Care | Payer: BLUE CROSS/BLUE SHIELD | Source: Ambulatory Visit | Attending: Cardiovascular Disease | Admitting: Cardiovascular Disease

## 2017-02-18 DIAGNOSIS — R0602 Shortness of breath: Secondary | ICD-10-CM | POA: Diagnosis not present

## 2017-02-18 DIAGNOSIS — I2109 ST elevation (STEMI) myocardial infarction involving other coronary artery of anterior wall: Secondary | ICD-10-CM | POA: Diagnosis not present

## 2017-02-18 DIAGNOSIS — R079 Chest pain, unspecified: Secondary | ICD-10-CM | POA: Insufficient documentation

## 2017-02-18 DIAGNOSIS — R5383 Other fatigue: Secondary | ICD-10-CM | POA: Insufficient documentation

## 2017-02-18 DIAGNOSIS — I10 Essential (primary) hypertension: Secondary | ICD-10-CM | POA: Diagnosis not present

## 2017-02-18 DIAGNOSIS — I2510 Atherosclerotic heart disease of native coronary artery without angina pectoris: Secondary | ICD-10-CM | POA: Insufficient documentation

## 2017-02-18 DIAGNOSIS — Z87891 Personal history of nicotine dependence: Secondary | ICD-10-CM | POA: Diagnosis not present

## 2017-02-18 DIAGNOSIS — R9439 Abnormal result of other cardiovascular function study: Secondary | ICD-10-CM | POA: Insufficient documentation

## 2017-02-18 DIAGNOSIS — Z8249 Family history of ischemic heart disease and other diseases of the circulatory system: Secondary | ICD-10-CM | POA: Insufficient documentation

## 2017-02-18 LAB — MYOCARDIAL PERFUSION IMAGING
CHL CUP NUCLEAR SDS: 3
CHL CUP NUCLEAR SSS: 25
CHL CUP RESTING HR STRESS: 68 {beats}/min
CSEPEDS: 0 s
CSEPHR: 90 %
CSEPPHR: 153 {beats}/min
Estimated workload: 9.9 METS
Exercise duration (min): 9 min
LV sys vol: 86 mL
LVDIAVOL: 143 mL (ref 62–150)
MPHR: 170 {beats}/min
RPE: 17
SRS: 22
TID: 1.12

## 2017-02-18 MED ORDER — TECHNETIUM TC 99M TETROFOSMIN IV KIT
10.5000 | PACK | Freq: Once | INTRAVENOUS | Status: AC | PRN
  Administered 2017-02-18: 10.5 via INTRAVENOUS
  Filled 2017-02-18: qty 11

## 2017-02-18 MED ORDER — TECHNETIUM TC 99M TETROFOSMIN IV KIT
31.7000 | PACK | Freq: Once | INTRAVENOUS | Status: AC | PRN
  Administered 2017-02-18: 31.7 via INTRAVENOUS
  Filled 2017-02-18: qty 32

## 2017-02-21 NOTE — Progress Notes (Deleted)
Cardiology Office Note   Date:  02/21/2017   ID:  Cesar Woods, DOB Jul 10, 1965, MRN [id number]  PCP:  Delilah Shan, MD  Cardiologist: [name], MD No chief complaint on file.    History of Present Illness: Cesar Woods is a 52 y.o. male who presents for ongoing assessment and management of hypertension, coronary artery disease, status post catheterization with 3 drug-eluting stents to the LAD per [name] in 2016.  The patient was last seen in the office on 02/15/2017 for recurrent chest pain and dyspnea.  After discussion with [name] as to need to repeat catheterization versus stress Myoview, it was recommended that he undergo stress Myoview..  Study was completed on 02/18/2017.   The left ventricular ejection fraction is moderately decreased (30-44%).  Nuclear stress EF: 40%.  There was no ST segment deviation noted during stress.  Defect 1: There is a large defect of severe severity present in the mid anterior, mid anterolateral, apical anterior, apical septal, apical inferior, apical lateral and apex location.  Findings consistent with prior myocardial infarction with peri-infarct ischemia.  This is an intermediate risk study.   Intermediate risk study due to large apical scar with dyskinesis and moderately  depressed left ventricular systolic function. There is only minimal peri-infarct ischemia.   Past Medical History:  Diagnosis Date  . ACS (acute coronary syndrome) ([place]) 11/2015  . Atypical chest pain    Negative Myoview 2010  . Coronary artery disease    a.s/p STEMI in 01/2015 requiring placement of 4 DES to LAD. b. 11/2015: NST showing apical scar with minimal peri-infarct ischemia.  . Essential hypertension   . Food allergy    Anaphylaxis with shell fish  . GERD (gastroesophageal reflux disease)   . History of migraines   . Hyperlipidemia   . Ischemic cardiomyopathy    a. Echo 05/2015: EF 40-45%, apical akineiss with Grade 1 DD.  [name] Myocardial  infarction ([place]) 2016  . 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 Risa Grill; had bilateral orchiectomy and XRT in past    Past Surgical History:  Procedure Laterality Date  . CARDIAC CATHETERIZATION N/A 01/15/2015   Procedure: Left Heart Cath and Coronary Angiography;  Surgeon: Troy Sine, MD;  Location: [facility];  Service: Cardiovascular;  Laterality: N/A;  . CARDIAC CATHETERIZATION  01/15/2015   Procedure: Coronary Stent Intervention;  Surgeon: Troy Sine, MD;  Location: [facility];  Service: Cardiovascular;;  . ORCHIECTOMY Bilateral      Current Outpatient Medications  Medication Sig Dispense Refill  . aspirin EC 81 MG EC tablet Take 1 tablet (81 mg total) by mouth daily.    [name] atorvastatin (LIPITOR) 80 MG tablet TAKE 1 TABLET BY MOUTH DAILY AT 6 PM 90 tablet 3  . carvedilol (COREG) 25 MG tablet Take 25 mg by mouth 2 (two) times daily with a meal.    . EPIPEN 2-PAK 0.3 MG/0.3ML SOAJ injection INJECT 0.3 MLS INTO THE MUSCLE ONCE **PATIENT NEEDS TO SCHEDULE AN OFFICE VISIT** 2 Device 0  . losartan (COZAAR) 25 MG tablet Take 1 tablet (25 mg total) by mouth daily. 90 tablet 3  . nitroGLYCERIN (NITROSTAT) 0.4 MG SL tablet Place 1 tablet (0.4 mg total) under the tongue every 5 (five) minutes x 3 doses as needed for chest pain. 25 tablet 12  . Omega-3 Fatty Acids (FISH OIL) 1000 MG CAPS Take 1 capsule by mouth daily.    [name]  Testosterone (ANDROGEL PUMP) 20.25 MG/ACT (1.62%) GEL Place 5 Squirts onto the skin daily.    . ticagrelor (BRILINTA) 90 MG TABS tablet Take 1 tablet (90 mg total) by mouth 2 (two) times daily. 180 tablet 3   No current facility-administered medications for this visit.     Allergies:   Shellfish allergy; Iodine; Penicillins; and Ace inhibitors    Social History:  The patient  reports that he has quit smoking. he has never used smokeless tobacco. He reports that he drinks alcohol. He reports that he does not use drugs.   Family  History:  The patient's family history includes Heart attack in his father and maternal grandmother; Hyperlipidemia in his unknown relative; Hypertension in his unknown relative; Stroke in his paternal grandfather.    ROS: All other systems are reviewed and negative. Unless otherwise mentioned in H&P    PHYSICAL EXAM: VS:  There were no vitals taken for this visit. , BMI There is no height or weight on file to calculate BMI. GEN: Well nourished, well developed, in no acute distress  HEENT: normal  Neck: no JVD, carotid bruits, or masses Cardiac: ***RRR; no murmurs, rubs, or gallops,no edema  Respiratory:  clear to auscultation bilaterally, normal work of breathing GI: soft, nontender, nondistended, + BS MS: no deformity or atrophy  Skin: warm and dry, no rash Neuro:  Strength and sensation are intact Psych: euthymic mood, full affect   EKG:  EKG {ACTION; IS/IS [id number]} ordered today. The ekg ordered today demonstrates ***   Recent Labs: 02/09/2017: Hemoglobin 14.7; Platelets 175 02/15/2017: ALT 19; BUN 19; Creatinine, Ser 0.84; Magnesium 2.0; Potassium 4.4; Sodium 142    Lipid Panel    Component Value Date/Time   CHOL 80 (L) 02/15/2017 1030   TRIG 106 02/15/2017 1030   HDL 30 (L) 02/15/2017 1030   CHOLHDL 2.7 02/15/2017 1030   CHOLHDL 4.5 04/16/2015 1028   VLDL 34 (H) 04/16/2015 1028   LDLCALC 29 02/15/2017 1030   LDLDIRECT 99.9 09/15/2012 1738      Wt Readings from Last 3 Encounters:  02/18/17 209 lb (94.8 kg)  02/15/17 209 lb (94.8 kg)  02/09/17 205 lb (93 kg)      Other studies Reviewed: Additional studies/ records that were reviewed today include: ***. Review of the above records demonstrates: ***   ASSESSMENT AND PLAN:  1.  ***   Current medicines are reviewed at length with the patient today.    Labs/ tests ordered today include: *** Phill Myron. West Pugh, ANP, AACC   02/21/2017 6:01 PM    [facility] [address], [place], [place] [address] Phone: [phone]; Fax: [phone]

## 2017-02-23 ENCOUNTER — Ambulatory Visit: Payer: BLUE CROSS/BLUE SHIELD | Admitting: Adult Health

## 2017-02-24 DIAGNOSIS — E291 Testicular hypofunction: Secondary | ICD-10-CM | POA: Diagnosis not present

## 2017-02-24 DIAGNOSIS — Z125 Encounter for screening for malignant neoplasm of prostate: Secondary | ICD-10-CM | POA: Diagnosis not present

## 2017-03-01 ENCOUNTER — Encounter: Payer: Self-pay | Admitting: Adult Health

## 2017-03-07 ENCOUNTER — Ambulatory Visit: Payer: BLUE CROSS/BLUE SHIELD | Admitting: Adult Health

## 2017-03-07 ENCOUNTER — Encounter: Payer: Self-pay | Admitting: Adult Health

## 2017-03-07 VITALS — BP 122/62 | HR 80 | Ht 72.0 in | Wt 209.4 lb

## 2017-03-07 DIAGNOSIS — I2510 Atherosclerotic heart disease of native coronary artery without angina pectoris: Secondary | ICD-10-CM | POA: Diagnosis not present

## 2017-03-07 DIAGNOSIS — R0789 Other chest pain: Secondary | ICD-10-CM | POA: Diagnosis not present

## 2017-03-07 DIAGNOSIS — E7800 Pure hypercholesterolemia, unspecified: Secondary | ICD-10-CM

## 2017-03-07 DIAGNOSIS — R109 Unspecified abdominal pain: Secondary | ICD-10-CM | POA: Diagnosis not present

## 2017-03-07 MED ORDER — CARVEDILOL 25 MG PO TABS: 25.0000 mg | ORAL_TABLET | Freq: Two times a day (BID) | ORAL | 6 refills | Status: DC

## 2017-03-07 MED ORDER — NITROGLYCERIN 0.4 MG SL SUBL: 0.4000 mg | SUBLINGUAL_TABLET | SUBLINGUAL | 12 refills | Status: DC | PRN

## 2017-03-07 MED ORDER — TICAGRELOR 90 MG PO TABS: 90.0000 mg | ORAL_TABLET | Freq: Two times a day (BID) | ORAL | 1 refills | Status: DC

## 2017-03-07 MED ORDER — ATORVASTATIN CALCIUM 80 MG PO TABS: ORAL_TABLET | ORAL | 3 refills | Status: DC

## 2017-03-07 MED ORDER — LOSARTAN POTASSIUM 25 MG PO TABS: 25.0000 mg | ORAL_TABLET | Freq: Every day | ORAL | 6 refills | Status: DC

## 2017-03-07 NOTE — Patient Instructions (Signed)
Medication Instructions:  NO CHANGES-Your physician recommends that you continue on your current medications as directed. Please refer to the Current Medication list given to you today.  If you need a refill on your cardiac medications before your next appointment, please call your pharmacy.  Special Instructions: GI REFERRAL  Follow-Up: Your physician wants you to follow-up in: June OR July WITH DR Dow Adolph should receive a reminder letter in the mail two months in advance. If you do not receive a letter, please call our office 05-2017 to schedule the 08-2017 follow-up appointment.   Thank you for choosing [facility] at [facility]!!

## 2017-03-07 NOTE — Progress Notes (Signed)
Cardiology Office Note   Date:  03/07/2017   ID:  Cesar Woods, DOB 31-Jul-1965, MRN [id number]  PCP:  Delilah Shan, MD  Cardiologist: [name]  Chief Complaint  Patient presents with  . Coronary Artery Disease     History of Present Illness: Cesar Woods is a 52 y.o. male who presents for ongoing assessment and management of coronary artery disease, hypertension, and hyperlipidemia.  The patient was last seen in the office on 02/15/2017.  His recent cardiac catheterization results were reviewed from 01/2015.  Revealing significant 90% ostial LAD stenosis followed by 99% proximal LAD stenosis in the region of the first septal perforating artery with significant thrombus.  The patient also had diffuse 50% stenosis followed by 95% ulcerated plaque in the proximal mid LAD.  There was also found to have an apical LAD dissection.  There was other areas of CAD with a 40% ramus intermittent, likely high marginal stenosis and a large dominant RCA with 30% stenosis.  The large PDA and PLA vessels with septal collateralization to the proximal LAD with successful intervention to his diffusely diseased LAD with insertion of 3 drug-eluting stents from the mid LAD to the ostium and PTCA of the distal LAD with low-level inflation tach of his apical initial dissection.  He was noted to have total occlusion of the diagonal vessel antegrade but there was significant development of retrograde collaterals to his diagonal vessel from the distal LAD.   Last office visit with complaints of chest discomfort and dyspnea while working out the patient was scheduled for a stress Myoview for evaluation of progressive CAD and ischemia causing his symptoms.  Study Highlights     The left ventricular ejection fraction is moderately decreased (30-44%).  Nuclear stress EF: 40%.  There was no ST segment deviation noted during stress.  Defect 1: There is a large defect of severe severity present in the mid  anterior, mid anterolateral, apical anterior, apical septal, apical inferior, apical lateral and apex location.  Findings consistent with prior myocardial infarction with peri-infarct ischemia.  This is an intermediate risk study.   Intermediate risk study due to large apical scar with dyskinesis and moderately  depressed left ventricular systolic function. There is only minimal peri-infarct ischemia.   Labs: TC 80; HDL, 30; LDL 29.   He comes today without cardiac complaints, He is planning a trip to [place] and [place] in March. He has started working out on treadmill again.   Past Medical History:  Diagnosis Date  . ACS (acute coronary syndrome) ([place]) 11/2015  . Atypical chest pain    Negative Myoview 2010  . Coronary artery disease    a.s/p STEMI in 01/2015 requiring placement of 4 DES to LAD. b. 11/2015: NST showing apical scar with minimal peri-infarct ischemia.  . Essential hypertension   . Food allergy    Anaphylaxis with shell fish  . GERD (gastroesophageal reflux disease)   . History of migraines   . Hyperlipidemia   . Ischemic cardiomyopathy    a. Echo 05/2015: EF 40-45%, apical akineiss with Grade 1 DD.  [name] Myocardial infarction ([place]) 2016  . 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 Risa Grill; had bilateral orchiectomy and XRT in past    Past Surgical History:  Procedure Laterality Date  . CARDIAC CATHETERIZATION N/A 01/15/2015   Procedure: Left Heart Cath and Coronary Angiography;  Surgeon: Troy Sine, MD;  Location: [facility];  Service: Cardiovascular;  Laterality: N/A;  . CARDIAC CATHETERIZATION  01/15/2015   Procedure: Coronary Stent Intervention;  Surgeon: Troy Sine, MD;  Location: [facility];  Service: Cardiovascular;;  . ORCHIECTOMY Bilateral      Current Outpatient Medications  Medication Sig Dispense Refill  . aspirin EC 81 MG EC tablet Take 1 tablet (81 mg total) by mouth daily.    [name] atorvastatin (LIPITOR) 80  MG tablet TAKE 1 TABLET BY MOUTH DAILY AT 6 PM 90 tablet 3  . carvedilol (COREG) 25 MG tablet Take 1 tablet (25 mg total) by mouth 2 (two) times daily with a meal. 60 tablet 6  . EPIPEN 2-PAK 0.3 MG/0.3ML SOAJ injection INJECT 0.3 MLS INTO THE MUSCLE ONCE **PATIENT NEEDS TO SCHEDULE AN OFFICE VISIT** 2 Device 0  . losartan (COZAAR) 25 MG tablet Take 1 tablet (25 mg total) by mouth daily. 30 tablet 6  . nitroGLYCERIN (NITROSTAT) 0.4 MG SL tablet Place 1 tablet (0.4 mg total) under the tongue every 5 (five) minutes x 3 doses as needed for chest pain. 25 tablet 12  . Omega-3 Fatty Acids (FISH OIL) 1000 MG CAPS Take 1 capsule by mouth daily.    . Testosterone (ANDROGEL PUMP) 20.25 MG/ACT (1.62%) GEL Place 5 Squirts onto the skin daily.    . ticagrelor (BRILINTA) 90 MG TABS tablet Take 1 tablet (90 mg total) by mouth 2 (two) times daily. 180 tablet 1   No current facility-administered medications for this visit.     Allergies:   Shellfish allergy; Iodine; Penicillins; and Ace inhibitors    Social History:  The patient  reports that he has quit smoking. he has never used smokeless tobacco. He reports that he drinks alcohol. He reports that he does not use drugs.   Family History:  The patient's family history includes Heart attack in his father and maternal grandmother; Hyperlipidemia in his unknown relative; Hypertension in his unknown relative; Stroke in his paternal grandfather.    ROS: All other systems are reviewed and negative. Unless otherwise mentioned in H&P    PHYSICAL EXAM: VS:  BP 122/62   Pulse 80   Ht 6' (1.829 m)   Wt 209 lb 6.4 oz (95 kg)   SpO2 97%   BMI 28.40 kg/m  , BMI Body mass index is 28.4 kg/m. GEN: Well nourished, well developed, in no acute distress  HEENT: normal  Cardiac: RRR; no murmurs, rubs, or gallops,no edema  Respiratory:  clear to auscultation bilaterally, normal work of breathing Neuro:  Strength and sensation are intact Psych: euthymic mood, full  affect  Recent Labs: 02/09/2017: Hemoglobin 14.7; Platelets 175 02/15/2017: ALT 19; BUN 19; Creatinine, Ser 0.84; Magnesium 2.0; Potassium 4.4; Sodium 142    Lipid Panel    Component Value Date/Time   CHOL 80 (L) 02/15/2017 1030   TRIG 106 02/15/2017 1030   HDL 30 (L) 02/15/2017 1030   CHOLHDL 2.7 02/15/2017 1030   CHOLHDL 4.5 04/16/2015 1028   VLDL 34 (H) 04/16/2015 1028   LDLCALC 29 02/15/2017 1030   LDLDIRECT 99.9 09/15/2012 1738      Wt Readings from Last 3 Encounters:  03/07/17 209 lb 6.4 oz (95 kg)  02/18/17 209 lb (94.8 kg)  02/15/17 209 lb (94.8 kg)      ASSESSMENT AND PLAN:  1. CAD: Hx of PCI from the mid LAD to the ostium and PTCA of the distal LAD with low-level inflation tach of his apical initial dissection. He was noted to have total  occlusion of the diagonal vessel antegrade but there was significant development of retrograde collaterals to his diagonal vessel from the distal LAD.  NM Stress test completed on 02/18/2017 was negative for new areas of ischemia.prior MI with scar was apparent. I have reviewed his test results including the lipid study with him today. He is given copies. Continue DAPT and secondary prevention.,  2. Chest discomfort: This may be GI related. He has had GI issues in the past and is seeing [facility] every 5 years for colonoscopy. I will refer him back to them He may need evaluaiton for GB disease. Will defer to them for more recommendations and testing.   3.Hyercholesterolemia:  Recent evaluation of lipids reveals excellent control. Continue current management.    Current medicines are reviewed at length with the patient today.    Labs/ tests ordered today include: None  Phill Myron. West Pugh, ANP, AACC   03/07/2017 4:35 PM    [facility] [address], [place], [place] [address] Phone: [phone]; Fax: [phone]

## 2017-03-09 DIAGNOSIS — K828 Other specified diseases of gallbladder: Secondary | ICD-10-CM | POA: Diagnosis not present

## 2017-03-09 DIAGNOSIS — K7689 Other specified diseases of liver: Secondary | ICD-10-CM | POA: Diagnosis not present

## 2017-03-09 DIAGNOSIS — R17 Unspecified jaundice: Secondary | ICD-10-CM | POA: Diagnosis not present

## 2017-03-09 DIAGNOSIS — R1010 Upper abdominal pain, unspecified: Secondary | ICD-10-CM | POA: Diagnosis not present

## 2017-03-09 DIAGNOSIS — R110 Nausea: Secondary | ICD-10-CM | POA: Diagnosis not present

## 2017-03-15 DIAGNOSIS — R0789 Other chest pain: Secondary | ICD-10-CM | POA: Diagnosis not present

## 2017-05-24 ENCOUNTER — Telehealth: Payer: Self-pay | Admitting: Cardiovascular Disease

## 2017-05-24 NOTE — Telephone Encounter (Signed)
w Message:   Pt is on Brilinta and Aspirin.He wants to know if it was alright that he took Advil 400mg ?

## 2017-05-24 NOTE — Telephone Encounter (Signed)
Returned call to Pt who reports he accidentally took a 400 mg Ibuprofen instead of tylenol. He was concerned if he should still take his Brilinta and ASA this morning. Spoke with Pharm D and advised Pt that it would be ok for him to still take his morning dose as well as re-educated on NSAIDS precaution and risk when taking regularly with Brilinta and ASA.

## 2017-05-25 DIAGNOSIS — M1611 Unilateral primary osteoarthritis, right hip: Secondary | ICD-10-CM | POA: Diagnosis not present

## 2017-05-25 DIAGNOSIS — M1612 Unilateral primary osteoarthritis, left hip: Secondary | ICD-10-CM | POA: Diagnosis not present

## 2017-05-25 DIAGNOSIS — M25551 Pain in right hip: Secondary | ICD-10-CM | POA: Diagnosis not present

## 2017-06-29 ENCOUNTER — Emergency Department (HOSPITAL_COMMUNITY)
Admission: EM | Admit: 2017-06-29 | Discharge: 2017-06-29 | Disposition: A | Discharge status: Home / Self Care | Payer: BLUE CROSS/BLUE SHIELD | Attending: Emergency Medicine | Admitting: Emergency Medicine

## 2017-06-29 ENCOUNTER — Other Ambulatory Visit: Payer: Self-pay

## 2017-06-29 ENCOUNTER — Emergency Department (HOSPITAL_COMMUNITY): Payer: BLUE CROSS/BLUE SHIELD

## 2017-06-29 ENCOUNTER — Encounter (HOSPITAL_COMMUNITY): Payer: Self-pay | Admitting: Emergency Medicine

## 2017-06-29 DIAGNOSIS — Z7982 Long term (current) use of aspirin: Secondary | ICD-10-CM | POA: Diagnosis not present

## 2017-06-29 DIAGNOSIS — I2510 Atherosclerotic heart disease of native coronary artery without angina pectoris: Secondary | ICD-10-CM | POA: Insufficient documentation

## 2017-06-29 DIAGNOSIS — R079 Chest pain, unspecified: Secondary | ICD-10-CM | POA: Diagnosis not present

## 2017-06-29 DIAGNOSIS — Z79899 Other long term (current) drug therapy: Secondary | ICD-10-CM | POA: Diagnosis not present

## 2017-06-29 DIAGNOSIS — Z87891 Personal history of nicotine dependence: Secondary | ICD-10-CM | POA: Insufficient documentation

## 2017-06-29 DIAGNOSIS — I5021 Acute systolic (congestive) heart failure: Secondary | ICD-10-CM | POA: Diagnosis not present

## 2017-06-29 DIAGNOSIS — I110 Hypertensive heart disease with heart failure: Secondary | ICD-10-CM | POA: Insufficient documentation

## 2017-06-29 DIAGNOSIS — R072 Precordial pain: Secondary | ICD-10-CM | POA: Diagnosis not present

## 2017-06-29 LAB — I-STAT TROPONIN, ED: Troponin i, poc: 0 ng/mL (ref 0.00–0.08)

## 2017-06-29 LAB — CBC
HCT: 42.8 % (ref 39.0–52.0)
Hemoglobin: 14.4 g/dL (ref 13.0–17.0)
MCH: 29.6 pg (ref 26.0–34.0)
MCHC: 33.6 g/dL (ref 30.0–36.0)
MCV: 87.9 fL (ref 78.0–100.0)
PLATELETS: 194 10*3/uL (ref 150–400)
RBC: 4.87 MIL/uL (ref 4.22–5.81)
RDW: 12.7 % (ref 11.5–15.5)
WBC: 9.5 10*3/uL (ref 4.0–10.5)

## 2017-06-29 LAB — BASIC METABOLIC PANEL
Anion gap: 11 (ref 5–15)
BUN: 15 mg/dL (ref 6–20)
CHLORIDE: 104 mmol/L (ref 101–111)
CO2: 24 mmol/L (ref 22–32)
CREATININE: 0.91 mg/dL (ref 0.61–1.24)
Calcium: 8.9 mg/dL (ref 8.9–10.3)
GFR calc Af Amer: >60 mL/min (ref >60)
GFR calc non Af Amer: >60 mL/min (ref >60)
GLUCOSE: 131 mg/dL — AB (ref 65–99)
Potassium: 3.9 mmol/L (ref 3.5–5.1)
Sodium: 139 mmol/L (ref 135–145)

## 2017-06-29 NOTE — ED Provider Notes (Signed)
[facility] EMERGENCY DEPARTMENT Provider Note   CSN: [id number] Arrival date & time: 06/29/17  1208     History   Chief Complaint Chief Complaint  Patient presents with  . Chest Pain    HPI DEAUNTE Cesar Woods is a 52 y.o. male.  HPI Patient is a 52 year old male presents the emergency department with an episode of arm pain and mild chest pain on Sunday followed by generalized feeling of fatigue this week.  He has a known history of coronary artery disease.  He has stents to his LAD.  He is compliant with his medications.  He does report over the past 6 months his diet and exercise is somewhat decreased.  Because he was feeling like this over the past several days he has now since began his exercise regimen with walking on a treadmill getting his heart rate up to 130.  She did this yesterday as well as today.  Yesterday and today he had no chest discomfort arm pain neck pain or jaw pain associated with this.  He had no shortness of breath.  He states he felt well.  Given his history of heart disease and the chest discomfort he was feeling earlier in the week he thought he would come to the ER for evaluation.  He is asymptomatic at this time.   Past Medical History:  Diagnosis Date  . ACS (acute coronary syndrome) ([place]) 11/2015  . Atypical chest pain    Negative Myoview 2010  . Coronary artery disease    a.s/p STEMI in 01/2015 requiring placement of 4 DES to LAD. b. 11/2015: NST showing apical scar with minimal peri-infarct ischemia.  . Essential hypertension   . Food allergy    Anaphylaxis with shell fish  . GERD (gastroesophageal reflux disease)   . History of migraines   . Hyperlipidemia   . Ischemic cardiomyopathy    a. Echo 05/2015: EF 40-45%, apical akineiss with Grade 1 DD.  [name] Myocardial infarction ([place]) 2016  . 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 Risa Grill; had bilateral orchiectomy and XRT in past    Patient Active Problem List   Diagnosis Date Noted  . Cardiomyopathy, ischemic 12/10/2015  . Essential hypertension 12/10/2015  . Chest pain with moderate risk for cardiac etiology 11/21/2015  . Hyperlipidemia LDL goal <70 06/25/2015  . Acute MI, anterolateral wall, subsequent episode of care ([place]) 04/20/2015  . Acute systolic congestive heart failure, NYHA class 2 ([place]) 01/16/2015  . Acute coronary syndrome ([place])   . ST elevation myocardial infarction (STEMI) of anterolateral wall ([place]) -- Delayed presentation, pain free on admission 01/14/2015  . Skin lesion 04/07/2012  . GOITER, MULTINODULAR 03/09/2010  . THYROID NODULE, RIGHT 03/06/2010  . DYSPHAGIA UNSPECIFIED 12/22/2009  . TESTICULAR CANCER 10/17/2009  . SHORTNESS OF BREATH 10/17/2009  . ALLERGY, FOOD 03/07/2009  . SKIN TAG 03/07/2009  . PALPITATIONS 08/21/2008  . ALLERGIC RHINITIS 08/12/2008  . COLONIC POLYPS, HX OF 08/12/2008  . Dyslipidemia 07/29/2008  . Hypertensive heart disease 07/29/2008  . GERD 07/29/2008  . CHEST PAIN, ATYPICAL 07/29/2008    Past Surgical History:  Procedure Laterality Date  . CARDIAC CATHETERIZATION N/A 01/15/2015   Procedure: Left Heart Cath and Coronary Angiography;  Surgeon: Troy Sine, MD;  Location: [facility];  Service: Cardiovascular;  Laterality: N/A;  . CARDIAC CATHETERIZATION  01/15/2015   Procedure: Coronary Stent Intervention;  Surgeon: Troy Sine, MD;  Location: [facility];  Service: Cardiovascular;;  . ORCHIECTOMY Bilateral         Home Medications    Prior to Admission medications   Medication Sig Start Date End Date Taking? Authorizing Provider  aspirin EC 81 MG EC tablet Take 1 tablet (81 mg total) by mouth daily. 01/18/15   Brett Canales, PA-C  atorvastatin (LIPITOR) 80 MG tablet TAKE 1 TABLET BY MOUTH DAILY AT 6 PM 03/07/17   Lendon Colonel, NP  carvedilol (COREG) 25 MG tablet Take 1 tablet (25 mg total) by mouth 2 (two) times daily with a meal. 03/07/17   Lendon Colonel, NP  EPIPEN 2-PAK 0.3 MG/0.3ML SOAJ injection INJECT 0.3 MLS INTO THE MUSCLE ONCE **PATIENT NEEDS TO SCHEDULE AN OFFICE VISIT** 06/12/13   Shawna Orleans, Doe-Hyun R, DO  losartan (COZAAR) 25 MG tablet Take 1 tablet (25 mg total) by mouth daily. 03/07/17   Lendon Colonel, NP  nitroGLYCERIN (NITROSTAT) 0.4 MG SL tablet Place 1 tablet (0.4 mg total) under the tongue every 5 (five) minutes x 3 doses as needed for chest pain. 03/07/17   Lendon Colonel, NP  Omega-3 Fatty Acids (FISH OIL) 1000 MG CAPS Take 1 capsule by mouth daily.    [provider]  Testosterone (ANDROGEL PUMP) 20.25 MG/ACT (1.62%) GEL Place 5 Squirts onto the skin daily.    [provider]  ticagrelor (BRILINTA) 90 MG TABS tablet Take 1 tablet (90 mg total) by mouth 2 (two) times daily. 03/07/17   Lendon Colonel, NP    Family History Family History  Problem Relation Age of Onset  . Heart attack Father        CABG at agae 82  . Stroke Paternal Grandfather   . Hypertension Unknown   . Hyperlipidemia Unknown   . Heart attack Maternal Grandmother     Social History Social History   Tobacco Use  . Smoking status: Former Research scientist (life sciences)  . Smokeless tobacco: Never Used  . Tobacco comment: quit around 2010  Substance Use Topics  . Alcohol use: Yes    Alcohol/week: 0.0 oz    Comment: occasionally  . Drug use: No    Comment: remote use of marijuana, quit a long time ago     Allergies   Shellfish allergy; Iodine; Penicillins; and Ace inhibitors   Review of Systems Review of Systems  All other systems reviewed and are negative.    Physical Exam Updated Vital Signs BP 126/80   Pulse 78   Temp 98 F (36.7 C)   Resp 18   SpO2 99%   Physical Exam  Constitutional: He is oriented to person, place, and time. He appears well-developed and well-nourished.  HENT:  Head: Normocephalic and atraumatic.  Eyes: EOM are normal.  Neck: Normal range of motion.  Cardiovascular: Normal rate, regular rhythm,  normal heart sounds and intact distal pulses.  Pulmonary/Chest: Effort normal and breath sounds normal. No respiratory distress.  Abdominal: Soft. He exhibits no distension. There is no tenderness.  Musculoskeletal: Normal range of motion.  Neurological: He is alert and oriented to person, place, and time.  Skin: Skin is warm and dry.  Psychiatric: He has a normal mood and affect. Judgment normal.  Nursing note and vitals reviewed.    ED Treatments / Results  Labs (all labs ordered are listed, but only abnormal results are displayed) Labs Reviewed  BASIC METABOLIC PANEL - Abnormal; Notable for the following components:      Result Value   Glucose, Bld 131 (*)  All other components within normal limits  CBC  I-STAT TROPONIN, ED    EKG EKG Interpretation  Date/Time:  Wednesday Jun 29 2017 12:15:47 EDT Ventricular Rate:  85 PR Interval:  170 QRS Duration: 84 QT Interval:  336 QTC Calculation: 399 R Axis:   -3 Text Interpretation:  Normal sinus rhythm Possible Left atrial enlargement Cannot rule out Inferior infarct , age undetermined Anterolateral infarct , age undetermined Abnormal ECG No significant change was found Confirmed by Jola Schmidt [phone]) on 06/29/2017 5:25:27 PM   Radiology Dg Chest 2 View  Result Date: 06/29/2017 CLINICAL DATA:  Chest and left arm pain. EXAM: CHEST - 2 VIEW COMPARISON:  None. FINDINGS: The heart size and mediastinal contours are within normal limits. Lad coronary artery stent is identified. Both lungs are clear. The visualized skeletal structures are unremarkable. IMPRESSION: No active cardiopulmonary disease. Electronically Signed   By: Kerby Moors M.D.   On: 06/29/2017 13:09    Procedures Procedures (including critical care time)  Medications Ordered in ED Medications - No data to display   Initial Impression / Assessment and Plan / ED Course  I have reviewed the triage vital signs and the nursing notes.  Pertinent labs & imaging  results that were available during my care of the patient were reviewed by me and considered in my medical decision making (see chart for details).     Overall well-appearing.  No chest discomfort today.  His chest discomfort was several days ago.  This atypical presentation.  His EKG is without ischemic changes.  Troponin is negative.  Close primary care and cardiology follow-up.  He is exercise both yesterday and today without chest discomfort.  This is adequate to suggest that this is likely noncardiac.  Patient understands return to the ER for new or worsening symptoms  At this time I do not think he needs additional evaluation.  He does not need admission to the hospital.  Final Clinical Impressions(s) / ED Diagnoses   Final diagnoses:  Precordial chest pain    ED Discharge Orders    None       Jola Schmidt, MD 06/29/17 [phone]

## 2017-06-29 NOTE — ED Triage Notes (Signed)
Pt states chest pain to central chest radiating into the left arm that feels like a previous MI in the past. The pain is currently 2/10 and he has had it since Sunday.

## 2017-08-08 ENCOUNTER — Telehealth: Payer: Self-pay | Admitting: Cardiovascular Disease

## 2017-08-08 NOTE — Telephone Encounter (Signed)
NEW MESSAGE   Patient's spouse calling to report patient went to ED in [place] over the past weekend  For palpitations and chest pain   Pt c/o of Chest Pain: STAT if CP now or developed within 24 hours  1. Are you having CP right now? no  2. Are you experiencing any other symptoms (ex. SOB, nausea, vomiting, sweating)? nausea  3. How long have you been experiencing CP? months  4. Is your CP continuous or coming and going? Coming and going 5. Have you taken Nitroglycerin? yes ?

## 2017-08-08 NOTE — Telephone Encounter (Signed)
Left a message to call back. Patient has an appointment on 08/10/17

## 2017-08-09 NOTE — H&P (View-Only) (Signed)
Cardiology Office Note:    Date:  08/10/2017   ID:  Cesar Woods, DOB [birth year], MRN [id number]  PCP:  Cesar Shan, MD  Cardiologist:  Cesar Majestic, MD   Referring MD: Cesar Shan, MD   Chief Complaint  Patient presents with  . Chest Pain    History of Present Illness:    Cesar Woods is a 52 y.o. male with a hx of hypertensive heart disease, hx of anterolateral wall STEMI with delayed presentation (pain-free on admission) 06/2017, ACS, chronic systolic heart failure, HTN, and HLD. Heart cath in 2016 with difficult PCI of LAD complicated by spiral dissection and DES x 3 in the ostial to mid LAD. He was last seen in clinic on 03/07/17 by Cesar Sims, NP. At that time, he complained of chest discomfort and dypnea while working outside. Myoview was performed and revealed no new areas of ischemia, prior MI scar was visible. Continue long-term DAPT.   He presented to the [facility] 06/29/17 to evaluate chest pain and arm pain that happened 3 days prior. He did not have chest pain during exercise.  POC troponin was negative.  It was felt this pain was not cardiac in nature and he was discharged with close cardiology follow up.  He presents today for follow up to this chest pain. He is still taking ASA and brilinta 90 mg BID. He also takes lipitor, coreg, and losartan.  Since being seen in the ER in May 2019, he continues to complain of palpitations, left arm pain and numbness, and chest discomfort.  He has stopped exercising because he has high anxiety about his cardiac health.  It is unclear if he has dyspnea on exertion or exertional chest pain.  Left arm pain, chest discomfort, and palpitations seem to occur nearly daily.  The patient reports that over the past weekend he and his wife traveled to [facility] for vacation.  While at a music concert he began to feel faint, lightheaded and headache.  They have returned to the hotel room and he woke up at approximately 2 AM not feeling well  with palpitations, feelings of skipped beats, and chest discomfort.  They went to the emergency room in [facility].  I do not have those records, but it sounds as though he had 2- troponins and EKG was not changed.  They ended up leaving after a very long wait.  Past Medical History:  Diagnosis Date  . ACS (acute coronary syndrome) ([place]) 11/2015  . Atypical chest pain    Negative Myoview 2010  . Coronary artery disease    a.s/p STEMI in 01/2015 requiring placement of 4 DES to LAD. b. 11/2015: NST showing apical scar with minimal peri-infarct ischemia.  . Essential hypertension   . Food allergy    Anaphylaxis with shell fish  . GERD (gastroesophageal reflux disease)   . History of migraines   . Hyperlipidemia   . Ischemic cardiomyopathy    a. Echo 05/2015: EF 40-45%, apical akineiss with Grade 1 DD.  [name] Myocardial infarction ([place]) 2016  . Testicle cancer ([place])    In remission - followed by Dr. Risa Grill; had bilateral orchiectomy and XRT in past    Past Surgical History:  Procedure Laterality Date  . CARDIAC CATHETERIZATION N/A 01/15/2015   Procedure: Left Heart Cath and Coronary Angiography;  Surgeon: Troy Sine, MD;  Location: [place] [facility];  Service: Cardiovascular;  Laterality: N/A;  . CARDIAC CATHETERIZATION  01/15/2015   Procedure: Coronary  Stent Intervention;  Surgeon: Troy Sine, MD;  Location: [place] [facility];  Service: Cardiovascular;;  . ORCHIECTOMY Bilateral     Current Medications: Current Meds  Medication Sig  . aspirin EC 81 MG EC tablet Take 1 tablet (81 mg total) by mouth daily.  [name] atorvastatin (LIPITOR) 80 MG tablet TAKE 1 TABLET BY MOUTH DAILY AT 6 PM  . carvedilol (COREG) 25 MG tablet Take 1 tablet (25 mg total) by mouth 2 (two) times daily with a meal.  . EPIPEN 2-PAK 0.3 MG/0.3ML SOAJ injection INJECT 0.3 MLS INTO THE MUSCLE ONCE **PATIENT NEEDS TO SCHEDULE AN OFFICE VISIT**  . losartan (COZAAR) 25 MG tablet Take 1 tablet (25 mg total) by mouth  daily.  . nitroGLYCERIN (NITROSTAT) 0.4 MG SL tablet Place 1 tablet (0.4 mg total) under the tongue every 5 (five) minutes x 3 doses as needed for chest pain.  . Omega-3 Fatty Acids (FISH OIL) 1000 MG CAPS Take 1 capsule by mouth daily.  . Testosterone (ANDROGEL PUMP) 20.25 MG/ACT (1.62%) GEL Place 5 Squirts onto the skin daily.  . ticagrelor (BRILINTA) 90 MG TABS tablet Take 1 tablet (90 mg total) by mouth 2 (two) times daily.     Allergies:   Shellfish allergy; Iodine; Penicillins; and Ace inhibitors   Social History   Socioeconomic History  . Marital status: Married    Spouse name: Not on file  . Number of children: Not on file  . Years of education: Not on file  . Highest education level: Not on file  Occupational History  . Not on file  Social Needs  . Financial resource strain: Not on file  . Food insecurity:    Worry: Not on file    Inability: Not on file  . Transportation needs:    Medical: Not on file    Non-medical: Not on file  Tobacco Use  . Smoking status: Former Research scientist (life sciences)  . Smokeless tobacco: Never Used  . Tobacco comment: quit around 2010  Substance and Sexual Activity  . Alcohol use: Yes    Alcohol/week: 0.0 oz    Comment: occasionally  . Drug use: No    Comment: remote use of marijuana, quit a long time ago  . Sexual activity: Not on file  Lifestyle  . Physical activity:    Days per week: Not on file    Minutes per session: Not on file  . Stress: Not on file  Relationships  . Social connections:    Talks on phone: Not on file    Gets together: Not on file    Attends religious service: Not on file    Active member of club or organization: Not on file    Attends meetings of clubs or organizations: Not on file    Relationship status: Not on file  Other Topics Concern  . Not on file  Social History Narrative   Occupation:Senior Trader for [employer]   Divorced    No children      Former smoker    Alcohol Use - yes           Family History: The  patient's family history includes Heart attack in his father and maternal grandmother; Hyperlipidemia in his unknown relative; Hypertension in his unknown relative; Stroke in his paternal grandfather.  ROS:   Please see the history of present illness.     All other systems reviewed and are negative.  EKGs/Labs/Other Studies Reviewed:    The following studies were reviewed today:  Myoview 02/18/17:  The left ventricular ejection fraction is moderately decreased (30-44%).  Nuclear stress EF: 40%.  There was no ST segment deviation noted during stress.  Defect 1: There is a large defect of severe severity present in the mid anterior, mid anterolateral, apical anterior, apical septal, apical inferior, apical lateral and apex location.  Findings consistent with prior myocardial infarction with peri-infarct ischemia.  This is an intermediate risk study.   Intermediate risk study due to large apical scar with dyskinesis and moderately depressed left ventricular systolic function. There is only minimal peri-infarct ischemia.   Left heart cath 01/15/15:  Ramus lesion, 40% stenosed.  Mid RCA lesion, 30% stenosed.  Prox LAD lesion, 90% stenosed. Post intervention, there is a 0% residual stenosis.  Mid LAD lesion, 99% stenosed.  Dist LAD-1 lesion, 95% stenosed.  Dist LAD-2 lesion, 70% stenosed. Post intervention, there is a 15% residual stenosis.  There is mild left ventricular systolic dysfunction.   Out of hospital anterolateral infarction with late presentation secondary to multiple high-grade stenoses in the LAD.  Mild acute LV dysfunction with mid anterolateral hypokinesis and an ejection fraction of 40-45%.  Significant obstructive disease with 90% ostial LAD stenosis followed by 99% proximal LAD stenosis in the region of the first septal perforating artery with significant thrombus, diffuse 50% stenosis followed by 95% ulcerated plaque in the proximal to mid LAD just prior  to a diagonal vessel and initial apical LAD dissection; 40% OM1 (ramus immediate like) stenosis; and large, dominant RCA with 30% mid stenosis, and large PDA and PLA vessels with septal collateralization to the proximal LAD.  Very difficult but successful percutaneous coronary intervention to diffusely diseased LAD system, complicated by spiral dissection into the mid distal LAD with ultimate insertion of 3 tandem Synergy DES stents (2.538, 3.038, and 3.020 mm) extending from the mid LAD to the ostium and PTCA to the distal LAD with low-level inflation to tack up region of dissection with ultimate improvement in transient no reflow phenomena and TIMI 2-3 flow distally; total occlusion of the diagonal vessel antegrade but evidence for development of retrograde collaterals to this diagonal vessel from the distal LAD.  Recommendation: The patient should be maintained on dual antiplatelet therapy indefinitely.  Upon leaving the catheterization laboratory.  He had stable hemodynamics.  His ECG did show more pronounced anterolateral ST elevation due to his diagonal occlusion.  His chest pain was significantly improved and he left the catheterization laboratory with stable hemodynamics.   EKG:  EKG is ordered today.  The ekg ordered today demonstrates sinus with ST-T changes that are stable from prior EKGs.  Recent Labs: 02/15/2017: ALT 19; Magnesium 2.0 06/29/2017: BUN 15; Creatinine, Ser 0.91; Hemoglobin 14.4; Platelets 194; Potassium 3.9; Sodium 139  Recent Lipid Panel    Component Value Date/Time   CHOL 80 (L) 02/15/2017 1030   TRIG 106 02/15/2017 1030   HDL 30 (L) 02/15/2017 1030   CHOLHDL 2.7 02/15/2017 1030   CHOLHDL 4.5 04/16/2015 1028   VLDL 34 (H) 04/16/2015 1028   LDLCALC 29 02/15/2017 1030   LDLDIRECT 99.9 09/15/2012 1738    Physical Exam:    VS:  BP 120/90   Pulse 79   Ht 6' (1.829 m)   Wt 209 lb 9.6 oz (95.1 kg)   BMI 28.43 kg/m     Wt Readings from Last 3 Encounters:    08/10/17 209 lb 9.6 oz (95.1 kg)  03/07/17 209 lb 6.4 oz (95 kg)  02/18/17 209 lb (94.8  kg)     GEN: Well nourished, well developed in no acute distress HEENT: Normal NECK: No JVD; No carotid bruits LYMPHATICS: No lymphadenopathy CARDIAC: RRR, no murmurs, rubs, gallops RESPIRATORY:  Clear to auscultation without rales, wheezing or rhonchi  ABDOMEN: Soft, non-tender, non-distended MUSCULOSKELETAL:  No edema; No deformity  SKIN: Warm and dry NEUROLOGIC:  Alert and oriented x 3 PSYCHIATRIC:  Normal affect   ASSESSMENT:    1. Coronary artery disease involving native coronary artery of native heart with angina pectoris ([place])   2. ST elevation myocardial infarction (STEMI) of anterolateral wall (HCC) -- Delayed presentation, pain free on admission   3. Cardiomyopathy, ischemic   4. Hypertensive heart disease with chronic systolic congestive heart failure ([place])   5. Dyslipidemia   6. Palpitations    PLAN:    In order of problems listed above:  Coronary artery disease involving native coronary artery of native heart with angina pectoris (HCC)  ST elevation myocardial infarction (STEMI) of anterolateral wall (HCC) -- Delayed presentation, pain free on admission  The patient has a lot of anxiety surrounding his cardiac health.  Given his symptoms of left arm pain, chest discomfort, and palpitations I am concerned about unstable angina. Case was discussed with Dr. Ellyn Hack (DOD).  Given his recent multiple visits to providers and emergency rooms, I think it best that he have a repeat heart catheterization for definitive visualization of his coronaries.  He agrees to proceed with this plan.  Risks and benefits were discussed. The patient understands that risks included but are not limited to stroke (1 in 1000), death (1 in 60), kidney failure [usually temporary] (1 in 500), bleeding (1 in 200), allergic reaction [possibly serious] (1 in 200).  The patient understands and agrees to proceed.   Will collect CBC and BMP ahead of heart cath scheduled with Dr. Ellyn Hack on 08/17/17. I will place orders. I instructed him to take his ASA and brilinta that morning.    Ischemic cardiomyopathy, hypertensive heart disease with chronic systolic heart failure Patient appears euvolemic on exam.  He is on a good heart failure regimen including Coreg and losartan.  Will repeat an echocardiogram.  Dyslipidemia 02/15/2017: Cholesterol, Total 80; HDL 30; LDL Calculated 29; Triglycerides 106 Continue statin.   Palpitations Palpitations started prior to his travel to [facility].  I have a low suspicion for PE.  Will collect Mg and TSH.  He reports palpitations daily.  Will place a 48-hour Holter monitor following his heart cath.   HTN Pressures today are well-controlled. No medication changes.      Medication Adjustments/Labs and Tests Ordered: Current medicines are reviewed at length with the patient today.  Concerns regarding medicines are outlined above.  Orders Placed This Encounter  Procedures  . Magnesium  . TSH  . CBC  . Basic Metabolic Panel (BMET)  . Holter monitor - 48 hour  . EKG 12-Lead  . ECHOCARDIOGRAM COMPLETE   No orders of the defined types were placed in this encounter.   Signed, Ledora Bottcher, [place]  08/10/2017 9:36 AM    [facility]

## 2017-08-09 NOTE — Progress Notes (Signed)
Cardiology Office Note:    Date:  08/10/2017   ID:  Cesar Woods, DOB 05/07/1965, MRN [id number]  PCP:  Delilah Shan, MD  Cardiologist:  [name], MD   Referring MD: Delilah Shan, MD   Chief Complaint  Patient presents with  . Chest Pain    History of Present Illness:    Cesar Woods is a 52 y.o. male with a hx of hypertensive heart disease, hx of anterolateral wall STEMI with delayed presentation (pain-free on admission) 06/2017, ACS, chronic systolic heart failure, HTN, and HLD. Heart cath in 2016 with difficult PCI of LAD complicated by spiral dissection and DES x 3 in the ostial to mid LAD. He was last seen in clinic on 03/07/17 by Jory Sims, NP. At that time, he complained of chest discomfort and dypnea while working outside. Myoview was performed and revealed no new areas of ischemia, prior MI scar was visible. Continue long-term DAPT.   He presented to the [facility] 06/29/17 to evaluate chest pain and arm pain that happened 3 days prior. He did not have chest pain during exercise.  POC troponin was negative.  It was felt this pain was not cardiac in nature and he was discharged with close cardiology follow up.  He presents today for follow up to this chest pain. He is still taking ASA and brilinta 90 mg BID. He also takes lipitor, coreg, and losartan.  Since being seen in the ER in May 2019, he continues to complain of palpitations, left arm pain and numbness, and chest discomfort.  He has stopped exercising because he has high anxiety about his cardiac health.  It is unclear if he has dyspnea on exertion or exertional chest pain.  Left arm pain, chest discomfort, and palpitations seem to occur nearly daily.  The patient reports that over the past weekend he and his wife traveled to [facility] for vacation.  While at a music concert he began to feel faint, lightheaded and headache.  They have returned to the hotel room and he woke up at approximately 2 AM not feeling well  with palpitations, feelings of skipped beats, and chest discomfort.  They went to the emergency room in [facility].  I do not have those records, but it sounds as though he had 2- troponins and EKG was not changed.  They ended up leaving after a very long wait.  Past Medical History:  Diagnosis Date  . ACS (acute coronary syndrome) ([place]) 11/2015  . Atypical chest pain    Negative Myoview 2010  . Coronary artery disease    a.s/p STEMI in 01/2015 requiring placement of 4 DES to LAD. b. 11/2015: NST showing apical scar with minimal peri-infarct ischemia.  . Essential hypertension   . Food allergy    Anaphylaxis with shell fish  . GERD (gastroesophageal reflux disease)   . History of migraines   . Hyperlipidemia   . Ischemic cardiomyopathy    a. Echo 05/2015: EF 40-45%, apical akineiss with Grade 1 DD.  [name] Myocardial infarction ([place]) 2016  . Testicle cancer ([place])    In remission - followed by Dr. Risa Grill; had bilateral orchiectomy and XRT in past    Past Surgical History:  Procedure Laterality Date  . CARDIAC CATHETERIZATION N/A 01/15/2015   Procedure: Left Heart Cath and Coronary Angiography;  Surgeon: Troy Sine, MD;  Location: [facility];  Service: Cardiovascular;  Laterality: N/A;  . CARDIAC CATHETERIZATION  01/15/2015   Procedure: Coronary  Stent Intervention;  Surgeon: Troy Sine, MD;  Location: [facility];  Service: Cardiovascular;;  . ORCHIECTOMY Bilateral     Current Medications: Current Meds  Medication Sig  . aspirin EC 81 MG EC tablet Take 1 tablet (81 mg total) by mouth daily.  [name] atorvastatin (LIPITOR) 80 MG tablet TAKE 1 TABLET BY MOUTH DAILY AT 6 PM  . carvedilol (COREG) 25 MG tablet Take 1 tablet (25 mg total) by mouth 2 (two) times daily with a meal.  . EPIPEN 2-PAK 0.3 MG/0.3ML SOAJ injection INJECT 0.3 MLS INTO THE MUSCLE ONCE **PATIENT NEEDS TO SCHEDULE AN OFFICE VISIT**  . losartan (COZAAR) 25 MG tablet Take 1 tablet (25 mg total) by mouth  daily.  . nitroGLYCERIN (NITROSTAT) 0.4 MG SL tablet Place 1 tablet (0.4 mg total) under the tongue every 5 (five) minutes x 3 doses as needed for chest pain.  . Omega-3 Fatty Acids (FISH OIL) 1000 MG CAPS Take 1 capsule by mouth daily.  . Testosterone (ANDROGEL PUMP) 20.25 MG/ACT (1.62%) GEL Place 5 Squirts onto the skin daily.  . ticagrelor (BRILINTA) 90 MG TABS tablet Take 1 tablet (90 mg total) by mouth 2 (two) times daily.     Allergies:   Shellfish allergy; Iodine; Penicillins; and Ace inhibitors   Social History   Socioeconomic History  . Marital status: Married    Spouse name: Not on file  . Number of children: Not on file  . Years of education: Not on file  . Highest education level: Not on file  Occupational History  . Not on file  Social Needs  . Financial resource strain: Not on file  . Food insecurity:    Worry: Not on file    Inability: Not on file  . Transportation needs:    Medical: Not on file    Non-medical: Not on file  Tobacco Use  . Smoking status: Former Research scientist (life sciences)  . Smokeless tobacco: Never Used  . Tobacco comment: quit around 2010  Substance and Sexual Activity  . Alcohol use: Yes    Alcohol/week: 0.0 oz    Comment: occasionally  . Drug use: No    Comment: remote use of marijuana, quit a long time ago  . Sexual activity: Not on file  Lifestyle  . Physical activity:    Days per week: Not on file    Minutes per session: Not on file  . Stress: Not on file  Relationships  . Social connections:    Talks on phone: Not on file    Gets together: Not on file    Attends religious service: Not on file    Active member of club or organization: Not on file    Attends meetings of clubs or organizations: Not on file    Relationship status: Not on file  Other Topics Concern  . Not on file  Social History Narrative   Occupation:Senior Trader for [employer]   Divorced    No children      Former smoker    Alcohol Use - yes           Family History: The  patient's family history includes Heart attack in his father and maternal grandmother; Hyperlipidemia in his unknown relative; Hypertension in his unknown relative; Stroke in his paternal grandfather.  ROS:   Please see the history of present illness.     All other systems reviewed and are negative.  EKGs/Labs/Other Studies Reviewed:    The following studies were reviewed today:  Myoview 02/18/17:  The left ventricular ejection fraction is moderately decreased (30-44%).  Nuclear stress EF: 40%.  There was no ST segment deviation noted during stress.  Defect 1: There is a large defect of severe severity present in the mid anterior, mid anterolateral, apical anterior, apical septal, apical inferior, apical lateral and apex location.  Findings consistent with prior myocardial infarction with peri-infarct ischemia.  This is an intermediate risk study.   Intermediate risk study due to large apical scar with dyskinesis and moderately depressed left ventricular systolic function. There is only minimal peri-infarct ischemia.   Left heart cath 01/15/15:  Ramus lesion, 40% stenosed.  Mid RCA lesion, 30% stenosed.  Prox LAD lesion, 90% stenosed. Post intervention, there is a 0% residual stenosis.  Mid LAD lesion, 99% stenosed.  Dist LAD-1 lesion, 95% stenosed.  Dist LAD-2 lesion, 70% stenosed. Post intervention, there is a 15% residual stenosis.  There is mild left ventricular systolic dysfunction.   Out of hospital anterolateral infarction with late presentation secondary to multiple high-grade stenoses in the LAD.  Mild acute LV dysfunction with mid anterolateral hypokinesis and an ejection fraction of 40-45%.  Significant obstructive disease with 90% ostial LAD stenosis followed by 99% proximal LAD stenosis in the region of the first septal perforating artery with significant thrombus, diffuse 50% stenosis followed by 95% ulcerated plaque in the proximal to mid LAD just prior  to a diagonal vessel and initial apical LAD dissection; 40% OM1 (ramus immediate like) stenosis; and large, dominant RCA with 30% mid stenosis, and large PDA and PLA vessels with septal collateralization to the proximal LAD.  Very difficult but successful percutaneous coronary intervention to diffusely diseased LAD system, complicated by spiral dissection into the mid distal LAD with ultimate insertion of 3 tandem Synergy DES stents (2.538, 3.038, and 3.020 mm) extending from the mid LAD to the ostium and PTCA to the distal LAD with low-level inflation to tack up region of dissection with ultimate improvement in transient no reflow phenomena and TIMI 2-3 flow distally; total occlusion of the diagonal vessel antegrade but evidence for development of retrograde collaterals to this diagonal vessel from the distal LAD.  Recommendation: The patient should be maintained on dual antiplatelet therapy indefinitely.  Upon leaving the catheterization laboratory.  He had stable hemodynamics.  His ECG did show more pronounced anterolateral ST elevation due to his diagonal occlusion.  His chest pain was significantly improved and he left the catheterization laboratory with stable hemodynamics.   EKG:  EKG is ordered today.  The ekg ordered today demonstrates sinus with ST-T changes that are stable from prior EKGs.  Recent Labs: 02/15/2017: ALT 19; Magnesium 2.0 06/29/2017: BUN 15; Creatinine, Ser 0.91; Hemoglobin 14.4; Platelets 194; Potassium 3.9; Sodium 139  Recent Lipid Panel    Component Value Date/Time   CHOL 80 (L) 02/15/2017 1030   TRIG 106 02/15/2017 1030   HDL 30 (L) 02/15/2017 1030   CHOLHDL 2.7 02/15/2017 1030   CHOLHDL 4.5 04/16/2015 1028   VLDL 34 (H) 04/16/2015 1028   LDLCALC 29 02/15/2017 1030   LDLDIRECT 99.9 09/15/2012 1738    Physical Exam:    VS:  BP 120/90   Pulse 79   Ht 6' (1.829 m)   Wt 209 lb 9.6 oz (95.1 kg)   BMI 28.43 kg/m     Wt Readings from Last 3 Encounters:    08/10/17 209 lb 9.6 oz (95.1 kg)  03/07/17 209 lb 6.4 oz (95 kg)  02/18/17 209 lb (94.8  kg)     GEN: Well nourished, well developed in no acute distress HEENT: Normal NECK: No JVD; No carotid bruits LYMPHATICS: No lymphadenopathy CARDIAC: RRR, no murmurs, rubs, gallops RESPIRATORY:  Clear to auscultation without rales, wheezing or rhonchi  ABDOMEN: Soft, non-tender, non-distended MUSCULOSKELETAL:  No edema; No deformity  SKIN: Warm and dry NEUROLOGIC:  Alert and oriented x 3 PSYCHIATRIC:  Normal affect   ASSESSMENT:    1. Coronary artery disease involving native coronary artery of native heart with angina pectoris ([place])   2. ST elevation myocardial infarction (STEMI) of anterolateral wall (HCC) -- Delayed presentation, pain free on admission   3. Cardiomyopathy, ischemic   4. Hypertensive heart disease with chronic systolic congestive heart failure ([place])   5. Dyslipidemia   6. Palpitations    PLAN:    In order of problems listed above:  Coronary artery disease involving native coronary artery of native heart with angina pectoris (HCC)  ST elevation myocardial infarction (STEMI) of anterolateral wall (HCC) -- Delayed presentation, pain free on admission  The patient has a lot of anxiety surrounding his cardiac health.  Given his symptoms of left arm pain, chest discomfort, and palpitations I am concerned about unstable angina. Case was discussed with Dr. Ellyn Hack (DOD).  Given his recent multiple visits to providers and emergency rooms, I think it best that he have a repeat heart catheterization for definitive visualization of his coronaries.  He agrees to proceed with this plan.  Risks and benefits were discussed. The patient understands that risks included but are not limited to stroke (1 in 1000), death (1 in 20), kidney failure [usually temporary] (1 in 500), bleeding (1 in 200), allergic reaction [possibly serious] (1 in 200).  The patient understands and agrees to proceed.   Will collect CBC and BMP ahead of heart cath scheduled with Dr. Ellyn Hack on 08/17/17. I will place orders. I instructed him to take his ASA and brilinta that morning.    Ischemic cardiomyopathy, hypertensive heart disease with chronic systolic heart failure Patient appears euvolemic on exam.  He is on a good heart failure regimen including Coreg and losartan.  Will repeat an echocardiogram.  Dyslipidemia 02/15/2017: Cholesterol, Total 80; HDL 30; LDL Calculated 29; Triglycerides 106 Continue statin.   Palpitations Palpitations started prior to his travel to [facility].  I have a low suspicion for PE.  Will collect Mg and TSH.  He reports palpitations daily.  Will place a 48-hour Holter monitor following his heart cath.   HTN Pressures today are well-controlled. No medication changes.      Medication Adjustments/Labs and Tests Ordered: Current medicines are reviewed at length with the patient today.  Concerns regarding medicines are outlined above.  Orders Placed This Encounter  Procedures  . Magnesium  . TSH  . CBC  . Basic Metabolic Panel (BMET)  . Holter monitor - 48 hour  . EKG 12-Lead  . ECHOCARDIOGRAM COMPLETE   No orders of the defined types were placed in this encounter.   Signed, Ledora Bottcher, [place]  08/10/2017 9:36 AM    [facility]

## 2017-08-10 ENCOUNTER — Ambulatory Visit: Payer: BLUE CROSS/BLUE SHIELD | Admitting: Physician Assistant

## 2017-08-10 ENCOUNTER — Encounter: Payer: Self-pay | Admitting: Physician Assistant

## 2017-08-10 VITALS — BP 120/90 | HR 79 | Ht 72.0 in | Wt 209.6 lb

## 2017-08-10 DIAGNOSIS — I5022 Chronic systolic (congestive) heart failure: Secondary | ICD-10-CM | POA: Diagnosis not present

## 2017-08-10 DIAGNOSIS — E785 Hyperlipidemia, unspecified: Secondary | ICD-10-CM

## 2017-08-10 DIAGNOSIS — I2510 Atherosclerotic heart disease of native coronary artery without angina pectoris: Secondary | ICD-10-CM | POA: Insufficient documentation

## 2017-08-10 DIAGNOSIS — I255 Ischemic cardiomyopathy: Secondary | ICD-10-CM | POA: Diagnosis not present

## 2017-08-10 DIAGNOSIS — I110 Hypertensive heart disease with heart failure: Secondary | ICD-10-CM

## 2017-08-10 DIAGNOSIS — I2109 ST elevation (STEMI) myocardial infarction involving other coronary artery of anterior wall: Secondary | ICD-10-CM

## 2017-08-10 DIAGNOSIS — R002 Palpitations: Secondary | ICD-10-CM

## 2017-08-10 DIAGNOSIS — I25119 Atherosclerotic heart disease of native coronary artery with unspecified angina pectoris: Secondary | ICD-10-CM

## 2017-08-10 DIAGNOSIS — Z9861 Coronary angioplasty status: Secondary | ICD-10-CM

## 2017-08-10 NOTE — Telephone Encounter (Signed)
Pt had an appt for eval today at 8 am.   Please see that office visit note.

## 2017-08-10 NOTE — Patient Instructions (Addendum)
Medication Instructions:  Your physician recommends that you continue on your current medications as directed. Please refer to the Current Medication list given to you today.  Labwork: Your physician recommends that you return for lab work in: TODAY-MAG, TSH, BMET, CBC  Testing/Procedures: Your physician has requested that you have an echocardiogram. Echocardiography is a painless test that uses sound waves to create images of your heart. It provides your doctor with information about the size and shape of your heart and how well your heart's chambers and valves are working. This procedure takes approximately one hour. There are no restrictions for this procedure. [place] has recommended that you wear a 48 HOUR holter monitor. Holter monitors are medical devices that record the heart's electrical activity. Doctors most often use these monitors to diagnose arrhythmias. Arrhythmias are problems with the speed or rhythm of the heartbeat. The monitor is a small, portable device. You can wear one while you do your normal daily activities. This is usually used to diagnose what is causing palpitations/syncope (passing out). [place] has requested that you have a cardiac catheterization (08/17/2017). Cardiac catheterization is used to diagnose and/or treat various heart conditions. Doctors may recommend this procedure for a number of different reasons. The most common reason is to evaluate chest pain. Chest pain can be a symptom of coronary artery disease (CAD), and cardiac catheterization can show whether plaque is narrowing or blocking your heart's arteries. This procedure is also used to evaluate the valves, as well as measure the blood flow and oxygen levels in different parts of your heart. For further information please visit [facility]uge[facility]iesta[facility]tn. Please follow instruction sheet, as given. SEE INSTRUCTIONS BELOW  Follow-Up: Your physician  recommends that you schedule a follow-up appointment in: 7-10 DAYS AFTER CATH WITH ANGIE DUKE, PA-C  Any Other Special Instructions Will Be Listed Below (If Applicable). If you need a refill on your cardiac medications before your next appointment, please call your pharmacy.      [facility] [address] [place] [address] Dept: [phone] Loc: [phone]  Cesar Woods  08/10/2017  You are scheduled for a Cardiac Catheterization on Wednesday, July 10 with Dr. [name].  1. Please arrive at the [facility] (Main Entrance A) at [facility]: [place], [place] [address] at 9:00 AM (two hours before your procedure to ensure your preparation). Free valet parking service is available.   Special note: Every effort is made to have your procedure done on time. Please understand that emergencies sometimes delay scheduled procedures.  2. Diet: Do not eat or drink anything after midnight prior to your procedure except sips of water to take medications.  3. Labs: TODAY the lab in our office-bmet, cbc, mag, tsh  4. Medication instructions in preparation for your procedure:   Current Outpatient Medications (Endocrine & Metabolic):  [name]  Testosterone (ANDROGEL PUMP) 20.25 MG/ACT (1.62%) GEL, Place 5 Squirts onto the skin daily.  Current Outpatient Medications (Cardiovascular):  .  atorvastatin (LIPITOR) 80 MG tablet, TAKE 1 TABLET BY MOUTH DAILY AT 6 PM .  carvedilol (COREG) 25 MG tablet, Take 1 tablet (25 mg total) by mouth 2 (two) times daily with a meal. .  EPIPEN 2-PAK 0.3 MG/0.3ML SOAJ injection, INJECT 0.3 MLS INTO THE MUSCLE ONCE **PATIENT NEEDS TO SCHEDULE AN OFFICE VISIT** .  losartan (COZAAR) 25 MG  tablet, Take 1 tablet (25 mg total) by mouth daily. .  nitroGLYCERIN (NITROSTAT) 0.4 MG SL tablet, Place 1 tablet (0.4 mg total) under the tongue every 5 (five) minutes x  3 doses as needed for chest pain.   Current Outpatient Medications (Analgesics):  .  aspirin EC 81 MG EC tablet, Take 1 tablet (81 mg total) by mouth daily.  Current Outpatient Medications (Hematological):  .  ticagrelor (BRILINTA) 90 MG TABS tablet, Take 1 tablet (90 mg total) by mouth 2 (two) times daily.  Current Outpatient Medications (Other):  [name]  Omega-3 Fatty Acids (FISH OIL) 1000 MG CAPS, Take 1 capsule by mouth daily.  HOLD LOSARTAN THE DAY OF CATH 5. Plan for one night stay--bring personal belongings. 6. Bring a current list of your medications and current insurance cards. 7. You MUST have a responsible person to drive you home. 8. Someone MUST be with you the first 24 hours after you arrive home or your discharge will be delayed. 9. Please wear clothes that are easy to get on and off and wear slip-on shoes.  Thank you for allowing [place] to care for you!   -- [facility]

## 2017-08-11 LAB — CBC
Hematocrit: 41.5 % (ref 37.5–51.0)
Hemoglobin: 13.9 g/dL (ref 13.0–17.7)
MCH: 29.5 pg (ref 26.6–33.0)
MCHC: 33.5 g/dL (ref 31.5–35.7)
MCV: 88 fL (ref 79–97)
PLATELETS: 195 10*3/uL (ref 150–450)
RBC: 4.71 x10E6/uL (ref 4.14–5.80)
RDW: 13.4 % (ref 12.3–15.4)
WBC: 8 10*3/uL (ref 3.4–10.8)

## 2017-08-11 LAB — BASIC METABOLIC PANEL
BUN/Creatinine Ratio: 17 (ref 9–20)
BUN: 15 mg/dL (ref 6–24)
CHLORIDE: 107 mmol/L — AB (ref 96–106)
CO2: 21 mmol/L (ref 20–29)
Calcium: 9 mg/dL (ref 8.7–10.2)
Creatinine, Ser: 0.86 mg/dL (ref 0.76–1.27)
GFR calc non Af Amer: 100 mL/min/{1.73_m2} (ref >59)
GFR, EST AFRICAN AMERICAN: 116 mL/min/{1.73_m2} (ref >59)
GLUCOSE: 99 mg/dL (ref 65–99)
Potassium: 4 mmol/L (ref 3.5–5.2)
SODIUM: 144 mmol/L (ref 134–144)

## 2017-08-11 LAB — TSH: TSH: 0.401 u[IU]/mL — ABNORMAL LOW (ref 0.450–4.500)

## 2017-08-11 LAB — MAGNESIUM: MAGNESIUM: 2.1 mg/dL (ref 1.6–2.3)

## 2017-08-12 ENCOUNTER — Ambulatory Visit (HOSPITAL_COMMUNITY): Payer: BLUE CROSS/BLUE SHIELD | Attending: Cardiovascular Disease

## 2017-08-12 ENCOUNTER — Other Ambulatory Visit: Payer: Self-pay

## 2017-08-12 DIAGNOSIS — R002 Palpitations: Secondary | ICD-10-CM

## 2017-08-12 DIAGNOSIS — I5022 Chronic systolic (congestive) heart failure: Secondary | ICD-10-CM | POA: Diagnosis not present

## 2017-08-12 DIAGNOSIS — I110 Hypertensive heart disease with heart failure: Secondary | ICD-10-CM

## 2017-08-12 DIAGNOSIS — I255 Ischemic cardiomyopathy: Secondary | ICD-10-CM | POA: Diagnosis not present

## 2017-08-12 DIAGNOSIS — I25119 Atherosclerotic heart disease of native coronary artery with unspecified angina pectoris: Secondary | ICD-10-CM | POA: Diagnosis not present

## 2017-08-12 DIAGNOSIS — I2109 ST elevation (STEMI) myocardial infarction involving other coronary artery of anterior wall: Secondary | ICD-10-CM | POA: Diagnosis not present

## 2017-08-12 DIAGNOSIS — E785 Hyperlipidemia, unspecified: Secondary | ICD-10-CM | POA: Diagnosis not present

## 2017-08-12 MED ORDER — PERFLUTREN LIPID MICROSPHERE
1.0000 mL | INTRAVENOUS | Status: AC | PRN
  Administered 2017-08-12: 2 mL via INTRAVENOUS

## 2017-08-16 ENCOUNTER — Telehealth: Payer: Self-pay | Admitting: *Deleted

## 2017-08-16 DIAGNOSIS — I200 Unstable angina: Secondary | ICD-10-CM | POA: Clinically undetermined

## 2017-08-16 NOTE — Telephone Encounter (Signed)
Catheterization scheduled at [facility] for: Wednesday July 10 ,2019 11:30 AM Verify arrival time and place: [place] Entrance A at: 9 AM  No solid food after midnight prior to cath, clear liquids until 5 AM day of procedure. Verify allergies in Epic Verify no diabetes medications.  AM meds can be  taken pre-cath with sip of water including: ASA 81 mg Brilinta 90 mg  Confirm patient has responsible person to drive home post procedure and for 24 hours after you arrive home  LMTCB to discuss instructions with patient.

## 2017-08-17 ENCOUNTER — Other Ambulatory Visit: Payer: Self-pay

## 2017-08-17 ENCOUNTER — Encounter (HOSPITAL_COMMUNITY)
Admission: RE | Disposition: A | Discharge status: Home / Self Care | Payer: Self-pay | Source: Ambulatory Visit | Attending: Cardiology

## 2017-08-17 ENCOUNTER — Ambulatory Visit (HOSPITAL_COMMUNITY)
Admission: RE | Admit: 2017-08-17 | Discharge: 2017-08-17 | Disposition: A | Discharge status: Home / Self Care | Payer: BLUE CROSS/BLUE SHIELD | Source: Ambulatory Visit | Attending: Cardiology | Admitting: Cardiology

## 2017-08-17 DIAGNOSIS — I255 Ischemic cardiomyopathy: Secondary | ICD-10-CM | POA: Diagnosis present

## 2017-08-17 DIAGNOSIS — Y831 Surgical operation with implant of artificial internal device as the cause of abnormal reaction of the patient, or of later complication, without mention of misadventure at the time of the procedure: Secondary | ICD-10-CM | POA: Insufficient documentation

## 2017-08-17 DIAGNOSIS — I25110 Atherosclerotic heart disease of native coronary artery with unstable angina pectoris: Secondary | ICD-10-CM | POA: Diagnosis not present

## 2017-08-17 DIAGNOSIS — Z955 Presence of coronary angioplasty implant and graft: Secondary | ICD-10-CM | POA: Diagnosis not present

## 2017-08-17 DIAGNOSIS — I200 Unstable angina: Secondary | ICD-10-CM | POA: Diagnosis present

## 2017-08-17 DIAGNOSIS — Z79899 Other long term (current) drug therapy: Secondary | ICD-10-CM | POA: Insufficient documentation

## 2017-08-17 DIAGNOSIS — R002 Palpitations: Secondary | ICD-10-CM | POA: Diagnosis not present

## 2017-08-17 DIAGNOSIS — Z9861 Coronary angioplasty status: Secondary | ICD-10-CM

## 2017-08-17 DIAGNOSIS — I252 Old myocardial infarction: Secondary | ICD-10-CM | POA: Insufficient documentation

## 2017-08-17 DIAGNOSIS — T82855A Stenosis of coronary artery stent, initial encounter: Secondary | ICD-10-CM | POA: Insufficient documentation

## 2017-08-17 DIAGNOSIS — Z880 Allergy status to penicillin: Secondary | ICD-10-CM | POA: Diagnosis not present

## 2017-08-17 DIAGNOSIS — Z888 Allergy status to other drugs, medicaments and biological substances status: Secondary | ICD-10-CM | POA: Diagnosis not present

## 2017-08-17 DIAGNOSIS — Z91013 Allergy to seafood: Secondary | ICD-10-CM | POA: Insufficient documentation

## 2017-08-17 DIAGNOSIS — I5042 Chronic combined systolic (congestive) and diastolic (congestive) heart failure: Secondary | ICD-10-CM | POA: Diagnosis not present

## 2017-08-17 DIAGNOSIS — E785 Hyperlipidemia, unspecified: Secondary | ICD-10-CM | POA: Diagnosis not present

## 2017-08-17 DIAGNOSIS — Z9889 Other specified postprocedural states: Secondary | ICD-10-CM | POA: Diagnosis not present

## 2017-08-17 DIAGNOSIS — Z8249 Family history of ischemic heart disease and other diseases of the circulatory system: Secondary | ICD-10-CM | POA: Diagnosis not present

## 2017-08-17 DIAGNOSIS — Z87891 Personal history of nicotine dependence: Secondary | ICD-10-CM | POA: Diagnosis not present

## 2017-08-17 DIAGNOSIS — I110 Hypertensive heart disease with heart failure: Secondary | ICD-10-CM | POA: Diagnosis not present

## 2017-08-17 DIAGNOSIS — Z9079 Acquired absence of other genital organ(s): Secondary | ICD-10-CM | POA: Diagnosis not present

## 2017-08-17 DIAGNOSIS — Z7982 Long term (current) use of aspirin: Secondary | ICD-10-CM | POA: Diagnosis not present

## 2017-08-17 DIAGNOSIS — I25119 Atherosclerotic heart disease of native coronary artery with unspecified angina pectoris: Secondary | ICD-10-CM | POA: Insufficient documentation

## 2017-08-17 DIAGNOSIS — K219 Gastro-esophageal reflux disease without esophagitis: Secondary | ICD-10-CM | POA: Insufficient documentation

## 2017-08-17 DIAGNOSIS — I2510 Atherosclerotic heart disease of native coronary artery without angina pectoris: Secondary | ICD-10-CM | POA: Diagnosis present

## 2017-08-17 HISTORY — PX: CORONARY BALLOON ANGIOPLASTY: CATH118233

## 2017-08-17 HISTORY — PX: LEFT HEART CATH AND CORONARY ANGIOGRAPHY: CATH118249

## 2017-08-17 LAB — POCT ACTIVATED CLOTTING TIME: ACTIVATED CLOTTING TIME: 373 s

## 2017-08-17 SURGERY — LEFT HEART CATH AND CORONARY ANGIOGRAPHY
Anesthesia: LOCAL

## 2017-08-17 MED ORDER — DIPHENHYDRAMINE HCL 50 MG/ML IJ SOLN
25.0000 mg | Freq: Once | INTRAMUSCULAR | Status: AC
  Administered 2017-08-17: 25 mg via INTRAVENOUS

## 2017-08-17 MED ORDER — HEPARIN (PORCINE) IN NACL 1000-0.9 UT/500ML-% IV SOLN
INTRAVENOUS | Status: DC | PRN
  Administered 2017-08-17: 500 mL

## 2017-08-17 MED ORDER — HEPARIN (PORCINE) IN NACL 2-0.9 UNITS/ML
INTRAMUSCULAR | Status: DC | PRN
  Administered 2017-08-17: 10 mL via INTRA_ARTERIAL

## 2017-08-17 MED ORDER — ACETAMINOPHEN 325 MG PO TABS: 650.0000 mg | ORAL_TABLET | ORAL | Status: DC | PRN

## 2017-08-17 MED ORDER — NITROGLYCERIN 1 MG/10 ML FOR IR/CATH LAB
INTRA_ARTERIAL | Status: DC | PRN
  Administered 2017-08-17: 200 ug via INTRA_ARTERIAL

## 2017-08-17 MED ORDER — HEPARIN SODIUM (PORCINE) 1000 UNIT/ML IJ SOLN
INTRAMUSCULAR | Status: DC | PRN
  Administered 2017-08-17 (×2): 5000 [IU] via INTRAVENOUS

## 2017-08-17 MED ORDER — SODIUM CHLORIDE 0.9 % IV SOLN: INTRAVENOUS | Status: DC

## 2017-08-17 MED ORDER — CARVEDILOL 25 MG PO TABS
25.0000 mg | ORAL_TABLET | Freq: Two times a day (BID) | ORAL | Status: DC
  Administered 2017-08-17: 25 mg via ORAL
  Filled 2017-08-17: qty 1

## 2017-08-17 MED ORDER — VERAPAMIL HCL 2.5 MG/ML IV SOLN
INTRAVENOUS | Status: AC
  Filled 2017-08-17: qty 2

## 2017-08-17 MED ORDER — HEPARIN (PORCINE) IN NACL 1000-0.9 UT/500ML-% IV SOLN
INTRAVENOUS | Status: AC
  Filled 2017-08-17: qty 1000

## 2017-08-17 MED ORDER — IOHEXOL 350 MG/ML SOLN
INTRAVENOUS | Status: DC | PRN
  Administered 2017-08-17: 130 mL via INTRA_ARTERIAL

## 2017-08-17 MED ORDER — SODIUM CHLORIDE 0.9% FLUSH: 3.0000 mL | INTRAVENOUS | Status: DC | PRN

## 2017-08-17 MED ORDER — NITROGLYCERIN 1 MG/10 ML FOR IR/CATH LAB
INTRA_ARTERIAL | Status: AC
  Filled 2017-08-17: qty 10

## 2017-08-17 MED ORDER — SODIUM CHLORIDE 0.9 % IV SOLN: 250.0000 mL | INTRAVENOUS | Status: DC | PRN

## 2017-08-17 MED ORDER — HEPARIN SODIUM (PORCINE) 1000 UNIT/ML IJ SOLN
INTRAMUSCULAR | Status: AC
  Filled 2017-08-17: qty 1

## 2017-08-17 MED ORDER — VERAPAMIL HCL 2.5 MG/ML IV SOLN
INTRAVENOUS | Status: DC | PRN
  Administered 2017-08-17: 10 mL via INTRA_ARTERIAL

## 2017-08-17 MED ORDER — ASPIRIN 81 MG PO CHEW: 81.0000 mg | CHEWABLE_TABLET | ORAL | Status: DC

## 2017-08-17 MED ORDER — SODIUM CHLORIDE 0.9 % WEIGHT BASED INFUSION
3.0000 mL/kg/h | INTRAVENOUS | Status: AC
  Administered 2017-08-17: 3 mL/kg/h via INTRAVENOUS

## 2017-08-17 MED ORDER — SODIUM CHLORIDE 0.9% FLUSH: 3.0000 mL | Freq: Two times a day (BID) | INTRAVENOUS | Status: DC

## 2017-08-17 MED ORDER — METHYLPREDNISOLONE SODIUM SUCC 125 MG IJ SOLR
125.0000 mg | Freq: Once | INTRAMUSCULAR | Status: AC
  Administered 2017-08-17: 125 mg via INTRAVENOUS

## 2017-08-17 MED ORDER — MIDAZOLAM HCL 2 MG/2ML IJ SOLN
INTRAMUSCULAR | Status: AC
  Filled 2017-08-17: qty 2

## 2017-08-17 MED ORDER — METHYLPREDNISOLONE SODIUM SUCC 125 MG IJ SOLR
INTRAMUSCULAR | Status: AC
  Administered 2017-08-17: 125 mg via INTRAVENOUS
  Filled 2017-08-17: qty 2

## 2017-08-17 MED ORDER — ONDANSETRON HCL 4 MG/2ML IJ SOLN: 4.0000 mg | Freq: Four times a day (QID) | INTRAMUSCULAR | Status: DC | PRN

## 2017-08-17 MED ORDER — LIDOCAINE HCL (PF) 1 % IJ SOLN
INTRAMUSCULAR | Status: AC
  Filled 2017-08-17: qty 30

## 2017-08-17 MED ORDER — CARVEDILOL 12.5 MG PO TABS
ORAL_TABLET | ORAL | Status: AC
  Administered 2017-08-17: 25 mg via ORAL
  Filled 2017-08-17: qty 2

## 2017-08-17 MED ORDER — MIDAZOLAM HCL 2 MG/2ML IJ SOLN
INTRAMUSCULAR | Status: DC | PRN
  Administered 2017-08-17: 2 mg via INTRAVENOUS
  Administered 2017-08-17: 1 mg via INTRAVENOUS

## 2017-08-17 MED ORDER — FENTANYL CITRATE (PF) 100 MCG/2ML IJ SOLN
INTRAMUSCULAR | Status: DC | PRN
  Administered 2017-08-17 (×3): 25 ug via INTRAVENOUS

## 2017-08-17 MED ORDER — LIDOCAINE HCL (PF) 1 % IJ SOLN
INTRAMUSCULAR | Status: DC | PRN
  Administered 2017-08-17: 3 mL

## 2017-08-17 MED ORDER — SODIUM CHLORIDE 0.9 % WEIGHT BASED INFUSION: 1.0000 mL/kg/h | INTRAVENOUS | Status: DC

## 2017-08-17 MED ORDER — DIPHENHYDRAMINE HCL 50 MG/ML IJ SOLN
INTRAMUSCULAR | Status: AC
  Administered 2017-08-17: 25 mg via INTRAVENOUS
  Filled 2017-08-17: qty 1

## 2017-08-17 SURGICAL SUPPLY — 16 items
BALLN SAPPHIRE ~~LOC~~ 3.0X10 (BALLOONS) ×1 IMPLANT
BALLN WOLVERINE 2.50X10 (BALLOONS) ×2
BALLOON WOLVERINE 2.50X10 (BALLOONS) IMPLANT
CATH INFINITI JR4 5F (CATHETERS) ×1 IMPLANT
CATH OPTITORQUE TIG 4.0 5F (CATHETERS) ×1 IMPLANT
CATH VISTA GUIDE 6FR XBLAD3.5 (CATHETERS) ×1 IMPLANT
DEVICE RAD COMP TR BAND LRG (VASCULAR PRODUCTS) ×1 IMPLANT
GLIDESHEATH SLEND SS 6F .021 (SHEATH) ×1 IMPLANT
GUIDEWIRE INQWIRE 1.5J.035X260 (WIRE) IMPLANT
INQWIRE 1.5J .035X260CM (WIRE) ×4
KIT ENCORE 26 ADVANTAGE (KITS) ×1 IMPLANT
KIT HEART LEFT (KITS) ×2 IMPLANT
PACK CARDIAC CATHETERIZATION (CUSTOM PROCEDURE TRAY) ×2 IMPLANT
TRANSDUCER W/STOPCOCK (MISCELLANEOUS) ×2 IMPLANT
TUBING CIL FLEX 10 FLL-RA (TUBING) ×2 IMPLANT
WIRE ASAHI PROWATER 180CM (WIRE) ×1 IMPLANT

## 2017-08-17 NOTE — Progress Notes (Addendum)
Pt having sinu tach between 100-108. Asymptomatic, denies pain, resting comfortably. Angie PA/ cardiology was contacted , EKG was ordered. Continue to monitor.  1715 spoke with Dr Ellyn Hack, order followed for carvedilol 25 mg.

## 2017-08-17 NOTE — Progress Notes (Signed)
Ed completed. Pt understands his Brilinta. He has gotten slack about diet and ex and is planning to get back focused. Will refer to [place].  [place], ACSM 3:27 PM 08/17/2017

## 2017-08-17 NOTE — Telephone Encounter (Signed)
Cardiac cath 08/17/17

## 2017-08-17 NOTE — Discharge Summary (Signed)
Discharge Summary/SAME DAY PCI    Patient ID: Cesar Woods,  MRN: [id number], DOB/AGE: September 08, 1965 52 y.o.  Admit date: 08/17/2017 Discharge date: 08/17/2017  Primary Care Provider: Delilah Shan Primary Cardiologist: [name]   Discharge Diagnoses    Principal Problem:   Progressive angina [facility]) Active Problems:   Chronic combined systolic and diastolic heart failure ([place])   Ischemic cardiomyopathy   Coronary artery disease involving native coronary artery of native heart with angina pectoris (HCC)  Allergies Allergies  Allergen Reactions  . Shellfish Allergy Anaphylaxis  . Iodine Other (See Comments)  . Penicillins Other (See Comments)    Childhood allergy Has patient had a PCN reaction causing immediate rash, facial/tongue/throat swelling, SOB or lightheadedness with hypotension: Unknown Has patient had a PCN reaction causing severe rash involving mucus membranes or skin necrosis: Unknown Has patient had a PCN reaction that required hospitalization: No Has patient had a PCN reaction occurring within the last 10 years: No If all of the above answers are "NO", then may proceed with Cephalosporin use.    . Ace Inhibitors Cough    Diagnostic Studies/Procedures    Cath: 08/17/17   Previously placed Prox-mid LAD drug eluting stents are mostly Patent with the exception of a focal 85% in-stent restenosis lesion at ~Diag2.  Scoring balloon angioplasty was performed. 2.5 X10 WOLVERINE  Post dilation balloon angioplasty was performed using a BALLOON SAPPHIRE [place] 3.0X10.  Post intervention, there is a 5% residual stenosis.  Ost 2nd Diag lesion is 100% stenosed-originally jailed at the time of anterior STEMI. Now fills via retrograde collateral flow from the distal LAD.  Ost 1st Mrg-1 lesion is 40% stenosed. Ost 1st Mrg-2 lesion is 55% stenosed.  There is moderate to severe left ventricular systolic dysfunction -distal anterior hypokinesis with apical aneurysmal  dilation and inferoapical hypo-to akinesis.  The left ventricular ejection fraction is 35-45% by visual estimate.  LV end diastolic pressure is mildly elevated.   Severe focal/isolated in-stent restenosis in the midportion of the extensive proximal to mid LAD stents.  This is roughly at the takeoff of a diagonal branch that was occluded.  The region was treated successfully with scoring balloon angioplasty followed by noncompliant balloon post dilation of the stent.  Moderate to severe cardiomyopathy with anteroapical hypo-to akinesis with aneurysmal dilation.  Plan: Uncomplicated PTCA intervention of in-stent restenosis, the patient stable first same-day discharge.  He is already on aspirin plus Brilinta which will be continued.  He is on stable home medications which he will continue in the outpatient setting.  Recommend dual antiplatelet therapy with Aspirin 81mg  daily long-term (beyond 12 months) because of Extensive LAD disease with multiple overlapping stents and existing disease elsewhere.[name], M.D., M.S. Interventional Cardiologist  _____________   History of Present Illness     52 y.o. male with a hx of hypertensive heart disease, hx of anterolateral wall STEMI with delayed presentation (pain-free on admission) 06/2017, ACS, chronic systolic heart failure, HTN, and HLD. Heart cath in 2016 with difficult PCI of LAD complicated by spiral dissection and DES x 3 in the ostial to mid LAD. He was last seen in clinic on 03/07/17 by Jory Sims, NP. At that time, he complained of chest discomfort and dypnea while working outside. Myoview was performed and revealed no new areas of ischemia, prior MI scar was visible. Continue long-term DAPT.   He presented to the [facility] 06/29/17 to evaluate chest pain and arm pain that happened  3 days prior. He did not have chest pain during exercise.  POC troponin was negative.  It was felt this pain was not cardiac in nature and he was  discharged with close cardiology follow up.  He presented to the office on 08/10/17 for follow up to this chest pain. He was still taking ASA and brilinta 90 mg BID. He was also lipitor, coreg, and losartan.  Since being seen in the ER in May 2019, he continued to complain of palpitations, left arm pain and numbness, and chest discomfort.  He had stopped exercising because he had high anxiety about his cardiac health.  It is unclear if he has dyspnea on exertion or exertional chest pain.  Left arm pain, chest discomfort, and palpitations seem to occur nearly daily.  The patient reported that over the past weekend prior to his office visit he and his wife traveled to [facility] for vacation.  While at a music concert he began to feel faint, lightheaded and headache.  They had returned to the hotel room and he woke up at approximately 2 AM not feeling well with palpitations, feelings of skipped beats, and chest discomfort.  They went to the emergency room in [facility]. Did not have those records, but it sounded as though he had 2- troponins and EKG was not changed.  They ended up leaving after a very long wait. Given his symptoms his case was discussed with the DOD, Dr. Ellyn Hack and he was set up for outpatient cardiac cath.   Hospital Course     Underwent cardiac cath noted above with Dr. Ellyn Hack noted above with severe focal ISR in the p/mLAD of previously placed stent. Uncomplicated PTCA of the ISR. Will be continued on ASA/Brilinta. Recommendations to continue DAPT beyond one year given the extent of the LAD disease with multiple overlapping stents. No complications noted. Seen by cardiac rehab while in short stay. Instructions/precautions given prior to discharge. Radial cath site stable prior to discharge.    Arad Maryanna Shape was seen by Dr. Ellyn Hack and determined stable for discharge home. Follow up in the office has been arranged. Medications are listed below.   _____________  Discharge Vitals Blood  pressure 116/64, pulse 86, temperature (!) 97.5 F (36.4 C), temperature source Oral, resp. rate 18, height 6' (1.829 m), weight 209 lb (94.8 kg), SpO2 100 %.  Filed Weights   08/17/17 0909  Weight: 209 lb (94.8 kg)    Labs & Radiologic Studies    CBC No results for input(s): WBC, NEUTROABS, HGB, HCT, MCV, PLT in the last 72 hours. Basic Metabolic Panel No results for input(s): NA, K, CL, CO2, GLUCOSE, BUN, CREATININE, CALCIUM, MG, PHOS in the last 72 hours. Liver Function Tests No results for input(s): AST, ALT, ALKPHOS, BILITOT, PROT, ALBUMIN in the last 72 hours. No results for input(s): LIPASE, AMYLASE in the last 72 hours. Cardiac Enzymes No results for input(s): CKTOTAL, CKMB, CKMBINDEX, TROPONINI in the last 72 hours. BNP Invalid input(s): POCBNP D-Dimer No results for input(s): DDIMER in the last 72 hours. Hemoglobin A1C No results for input(s): HGBA1C in the last 72 hours. Fasting Lipid Panel No results for input(s): CHOL, HDL, LDLCALC, TRIG, CHOLHDL, LDLDIRECT in the last 72 hours. Thyroid Function Tests No results for input(s): TSH, T4TOTAL, T3FREE, THYROIDAB in the last 72 hours.  Invalid input(s): FREET3 _____________  No results found. Disposition   Pt is being discharged home today in good condition.  Follow-up Plans & Appointments  Follow-up Information    Erlene Quan, PA-C Follow up on 08/25/2017.   Specialties:  Cardiology, Radiology Why:  at 11:30am for your follow up appt.  Contact information: [address] [place] [address] [phone]          Discharge Instructions    Amb Referral to Cardiac Rehabilitation   Complete by:  As directed    Diagnosis:  PTCA   Diet - low sodium heart healthy   Complete by:  As directed    Discharge instructions   Complete by:  As directed    Radial Site Care Refer to this sheet in the next few weeks. These instructions provide you with information on caring for yourself after your  procedure. Your caregiver may also give you more specific instructions. Your treatment has been planned according to current medical practices, but problems sometimes occur. Call your caregiver if you have any problems or questions after your procedure. HOME CARE INSTRUCTIONS You may shower the day after the procedure.Remove the bandage (dressing) and gently wash the site with plain soap and water.Gently pat the site dry.  Do not apply powder or lotion to the site.  Do not submerge the affected site in water for 3 to 5 days.  Inspect the site at least twice daily.  Do not flex or bend the affected arm for 24 hours.  No lifting over 5 pounds (2.3 kg) for 5 days after your procedure.  Do not drive home if you are discharged the same day of the procedure. Have someone else drive you.  You may drive 24 hours after the procedure unless otherwise instructed by your caregiver.  What to expect: Any bruising will usually fade within 1 to 2 weeks.  Blood that collects in the tissue (hematoma) may be painful to the touch. It should usually decrease in size and tenderness within 1 to 2 weeks.  SEEK IMMEDIATE MEDICAL CARE IF: You have unusual pain at the radial site.  You have redness, warmth, swelling, or pain at the radial site.  You have drainage (other than a small amount of blood on the dressing).  You have chills.  You have a fever or persistent symptoms for more than 72 hours.  You have a fever and your symptoms suddenly get worse.  Your arm becomes pale, cool, tingly, or numb.  You have heavy bleeding from the site. Hold pressure on the site.   PLEASE DO NOT MISS ANY DOSES OF YOUR BRILINTA!!!!! Also keep a log of you blood pressures and bring back to your follow up appt. Please call the office with any questions.   Patients taking blood thinners should generally stay away from medicines like ibuprofen, Advil, Motrin, naproxen, and Aleve due to risk of stomach bleeding. You may take Tylenol as  directed or talk to your primary doctor about alternatives.   Increase activity slowly   Complete by:  As directed       Discharge Medications     Medication List    STOP taking these medications   ANDROGEL PUMP 20.25 MG/ACT (1.62%) Gel Generic drug:  Testosterone     TAKE these medications   aspirin 81 MG EC tablet Take 1 tablet (81 mg total) by mouth daily.   atorvastatin 80 MG tablet Commonly known as:  LIPITOR TAKE 1 TABLET BY MOUTH DAILY AT 6 PM   carvedilol 25 MG tablet Commonly known as:  COREG Take 1 tablet (25 mg total) by mouth 2 (two) times  daily with a meal.   EPIPEN 2-PAK 0.3 mg/0.3 mL Soaj injection Generic drug:  EPINEPHrine INJECT 0.3 MLS INTO THE MUSCLE ONCE **PATIENT NEEDS TO SCHEDULE AN OFFICE VISIT**   losartan 25 MG tablet Commonly known as:  COZAAR Take 1 tablet (25 mg total) by mouth daily.   nitroGLYCERIN 0.4 MG SL tablet Commonly known as:  NITROSTAT Place 1 tablet (0.4 mg total) under the tongue every 5 (five) minutes x 3 doses as needed for chest pain.   ticagrelor 90 MG Tabs tablet Commonly known as:  BRILINTA Take 1 tablet (90 mg total) by mouth 2 (two) times daily.       Acute coronary syndrome (MI, NSTEMI, STEMI, etc) this admission?: No.   Outstanding Labs/Studies   N/a   Duration of Discharge Encounter   Greater than 30 minutes including physician time.  Signed, Reino Bellis NP-C 08/17/2017, 3:35 PM

## 2017-08-17 NOTE — Interval H&P Note (Signed)
History and Physical Interval Note:  08/17/2017 12:00 PM  Cesar Woods  has presented today for surgery, with the diagnosis of Chest Pain-concerning for progressive angina.  The various methods of treatment have been discussed with the patient and family. After consideration of risks, benefits and other options for treatment, the patient has consented to  Procedure(s): LEFT HEART CATH AND CORONARY ANGIOGRAPHY (N/A) with possible PERCUTANEOUS CORONARY INTERVENTION as a surgical intervention .  The patient's history has been reviewed, patient examined, no change in status, stable for surgery.  I have reviewed the patient's chart and labs.  Questions were answered to the patient's satisfaction.    Cath Lab Visit (complete for each Cath Lab visit)  Clinical Evaluation Leading to the Procedure:   ACS: No.  Non-ACS:    Anginal Classification: CCS III  Anti-ischemic medical therapy: Minimal Therapy (1 class of medications)  Non-Invasive Test Results: Equivocal test results -nonischemic stress test, however now persistent symptoms.  Prior CABG: No previous CABG   Cesar Woods

## 2017-08-17 NOTE — Discharge Instructions (Signed)

## 2017-08-18 ENCOUNTER — Encounter (HOSPITAL_COMMUNITY): Payer: Self-pay | Admitting: Cardiology

## 2017-08-18 ENCOUNTER — Telehealth (HOSPITAL_COMMUNITY): Payer: Self-pay

## 2017-08-18 NOTE — Telephone Encounter (Signed)
Patients insurance is active and benefits verified for through [employer] - no co-pay, deductible amount of $400.00/$211.53 has been met, out of pocket amount of $2,500/$781.53 has been met, 15% co-insurance, and no pre-authorization is required. Passport/reference [id number]  Will contact patient to see if he is interested in the Cardiac Rehab Program. If interested, patient will be contacted for scheduling upon review by the RN Navigator.

## 2017-08-19 ENCOUNTER — Telehealth (HOSPITAL_COMMUNITY): Payer: Self-pay

## 2017-08-19 NOTE — Telephone Encounter (Signed)
Attempted to contact patient to see if he is interested in Cardiac Rehab - lm on vm

## 2017-08-22 ENCOUNTER — Ambulatory Visit (INDEPENDENT_AMBULATORY_CARE_PROVIDER_SITE_OTHER): Payer: BLUE CROSS/BLUE SHIELD

## 2017-08-22 DIAGNOSIS — I2109 ST elevation (STEMI) myocardial infarction involving other coronary artery of anterior wall: Secondary | ICD-10-CM | POA: Diagnosis not present

## 2017-08-22 DIAGNOSIS — I25119 Atherosclerotic heart disease of native coronary artery with unspecified angina pectoris: Secondary | ICD-10-CM | POA: Diagnosis not present

## 2017-08-22 DIAGNOSIS — I255 Ischemic cardiomyopathy: Secondary | ICD-10-CM

## 2017-08-22 DIAGNOSIS — E785 Hyperlipidemia, unspecified: Secondary | ICD-10-CM

## 2017-08-22 DIAGNOSIS — R002 Palpitations: Secondary | ICD-10-CM

## 2017-08-22 DIAGNOSIS — I110 Hypertensive heart disease with heart failure: Secondary | ICD-10-CM | POA: Diagnosis not present

## 2017-08-22 DIAGNOSIS — I5022 Chronic systolic (congestive) heart failure: Secondary | ICD-10-CM

## 2017-08-24 ENCOUNTER — Telehealth (HOSPITAL_COMMUNITY): Payer: Self-pay

## 2017-08-24 NOTE — Telephone Encounter (Signed)
Attempted to contact patient in regards to Cardiac Rehab - lm on vm °

## 2017-08-25 ENCOUNTER — Encounter: Payer: Self-pay | Admitting: Cardiology

## 2017-08-25 ENCOUNTER — Ambulatory Visit (INDEPENDENT_AMBULATORY_CARE_PROVIDER_SITE_OTHER): Payer: BLUE CROSS/BLUE SHIELD | Admitting: Cardiology

## 2017-08-25 VITALS — BP 110/72 | HR 73 | Ht 72.0 in | Wt 196.0 lb

## 2017-08-25 DIAGNOSIS — E785 Hyperlipidemia, unspecified: Secondary | ICD-10-CM | POA: Diagnosis not present

## 2017-08-25 DIAGNOSIS — I252 Old myocardial infarction: Secondary | ICD-10-CM

## 2017-08-25 DIAGNOSIS — Z8547 Personal history of malignant neoplasm of testis: Secondary | ICD-10-CM | POA: Diagnosis not present

## 2017-08-25 DIAGNOSIS — I255 Ischemic cardiomyopathy: Secondary | ICD-10-CM

## 2017-08-25 DIAGNOSIS — Z79899 Other long term (current) drug therapy: Secondary | ICD-10-CM

## 2017-08-25 DIAGNOSIS — R7989 Other specified abnormal findings of blood chemistry: Secondary | ICD-10-CM

## 2017-08-25 DIAGNOSIS — I2510 Atherosclerotic heart disease of native coronary artery without angina pectoris: Secondary | ICD-10-CM

## 2017-08-25 DIAGNOSIS — Z9861 Coronary angioplasty status: Secondary | ICD-10-CM | POA: Diagnosis not present

## 2017-08-25 NOTE — Assessment & Plan Note (Signed)
EF 40%- will ask Dr Claiborne Billings about starting Delene Loll

## 2017-08-25 NOTE — Assessment & Plan Note (Signed)
Anterior STEMI Dec 2016- late presentation

## 2017-08-25 NOTE — Assessment & Plan Note (Signed)
S/P orchiectomy. He is on Testosterone replacement

## 2017-08-25 NOTE — Patient Instructions (Addendum)
Medication Instructions: Your physician recommends that you continue on your current medications as directed. Please refer to the Current Medication list given to you today.  Labwork: Please return for labs (TSH, free T4)  Our in office lab hours are Monday-Friday 8:00-4:00, closed for lunch 12:45-1:45 pm.  No appointment needed.  Follow-Up: 2-3 months with Dr. Dorris Singh to start cardiac rehab [phone]  Any Other Special Instructions Will Be Listed Below (If Applicable).     If you need a refill on your cardiac medications before your next appointment, please call your pharmacy.

## 2017-08-25 NOTE — Assessment & Plan Note (Signed)
LDL in the 20's July 2019

## 2017-08-25 NOTE — Progress Notes (Signed)
08/25/2017 Cesar Woods   [birth year]  [id number]  Primary Physician Delilah Shan, MD Primary Cardiologist: Dr Claiborne Billings  HPI:  Pleasant 52 y/o male followed by Dr Claiborne Billings with a history of CAD. He had an out of hospital anterior MI in Dec 2016. He had 3 stents placed to the LAD in a 4 hour PCI. His EF was 40-45% post MI.  He was recently in [place] at the end of June 2019. He woke up around 2 am one night with chest discomfort.  He went to the ED there and tells me his Troponin were negative x 2.  He ended up leaving AMA, he did not want to be admitted there. When he returned he was seen in the office 08/10/17 and it was decided to proceed with coronary angiogram. This was done 08/17/17 by Dr Ellyn Hack and revealed mid LAD ISR. He also has residual occlusion of a small Dx2 with L-L collaterals, 30% RCA and 55% mid OM1.  His EF at cath was 35-45%, echo 08/12/17 showed his EF to be 40%. He was treated with PTCA with good final result.  He is in the office today for follow up. He hs had no further chest pain. He has noted his HR running around 100 at times (he has an apple watch). A 48 hour Holter was obtained but I don't have those results. He has not had chest pain. He did say he has had intermittent  Lt arm discomfort, not exertional. 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 He admits he has lost some weight, he felt it was from dietary changes. He has had prior thyroid biopsy.    Current Outpatient Medications  Medication Sig Dispense Refill  . aspirin EC 81 MG EC tablet Take 1 tablet (81 mg total) by mouth daily.    [name] atorvastatin (LIPITOR) 80 MG tablet TAKE 1 TABLET BY MOUTH DAILY AT 6 PM 90 tablet 3  . carvedilol (COREG) 25 MG tablet Take 1 tablet (25 mg total) by mouth 2 (two) times daily with a meal. 60 tablet 6  . EPIPEN 2-PAK 0.3 MG/0.3ML SOAJ injection INJECT 0.3 MLS INTO THE MUSCLE ONCE **PATIENT  NEEDS TO SCHEDULE AN OFFICE VISIT** 2 Device 0  . losartan (COZAAR) 25 MG tablet Take 1 tablet (25 mg total) by mouth daily. 30 tablet 6  . nitroGLYCERIN (NITROSTAT) 0.4 MG SL tablet Place 1 tablet (0.4 mg total) under the tongue every 5 (five) minutes x 3 doses as needed for chest pain. 25 tablet 12  . ticagrelor (BRILINTA) 90 MG TABS tablet Take 1 tablet (90 mg total) by mouth 2 (two) times daily. 180 tablet 1   No current facility-administered medications for this visit.     Allergies  Allergen Reactions  . Shellfish Allergy Anaphylaxis  . Iodine Other (See Comments)  . Penicillins Other (See Comments)    Childhood allergy Has patient had a PCN reaction causing immediate rash, facial/tongue/throat swelling, SOB or lightheadedness with hypotension: Unknown Has patient had a PCN reaction causing severe rash involving mucus membranes or skin necrosis: Unknown Has patient had a PCN reaction that required hospitalization: No Has patient had a PCN reaction occurring within the last 10 years: No If all of the above answers are "NO", then may proceed with Cephalosporin use.    . Ace Inhibitors Cough    Past Medical History:  Diagnosis Date  .  ACS (acute coronary syndrome) ([place]) 11/2015  . Atypical chest pain    Negative Myoview 2010  . Coronary artery disease    a.s/p STEMI in 01/2015 requiring placement of 4 DES to LAD. b. 11/2015: NST showing apical scar with minimal peri-infarct ischemia.  . Essential hypertension   . Food allergy    Anaphylaxis with shell fish  . GERD (gastroesophageal reflux disease)   . History of migraines   . Hyperlipidemia   . Ischemic cardiomyopathy    a. Echo 05/2015: EF 40-45%, apical akineiss with Grade 1 DD.  [name] Myocardial infarction ([place]) 2016  . Testicle cancer ([place])    In remission - followed by Dr. Risa Grill; had bilateral orchiectomy and XRT in past    Social History   Socioeconomic History  . Marital status: Married    Spouse name: Not on  file  . Number of children: Not on file  . Years of education: Not on file  . Highest education level: Not on file  Occupational History  . Not on file  Social Needs  . Financial resource strain: Not on file  . Food insecurity:    Worry: Not on file    Inability: Not on file  . Transportation needs:    Medical: Not on file    Non-medical: Not on file  Tobacco Use  . Smoking status: Former Research scientist (life sciences)  . Smokeless tobacco: Never Used  . Tobacco comment: quit around 2010  Substance and Sexual Activity  . Alcohol use: Yes    Alcohol/week: 0.0 oz    Comment: occasionally  . Drug use: No    Comment: remote use of marijuana, quit a long time ago  . Sexual activity: Not on file  Lifestyle  . Physical activity:    Days per week: Not on file    Minutes per session: Not on file  . Stress: Not on file  Relationships  . Social connections:    Talks on phone: Not on file    Gets together: Not on file    Attends religious service: Not on file    Active member of club or organization: Not on file    Attends meetings of clubs or organizations: Not on file    Relationship status: Not on file  . Intimate partner violence:    Fear of current or ex partner: Not on file    Emotionally abused: Not on file    Physically abused: Not on file    Forced sexual activity: Not on file  Other Topics Concern  . Not on file  Social History Narrative   Occupation:Senior Trader for [employer]   Divorced    No children      Former smoker    Alcohol Use - yes           Family History  Problem Relation Age of Onset  . Heart attack Father        CABG at agae 16  . Stroke Paternal Grandfather   . Hypertension Unknown   . Hyperlipidemia Unknown   . Heart attack Maternal Grandmother      Review of Systems: General: negative for chills, fever, night sweats or weight changes.  Cardiovascular: negative for chest pain, dyspnea on exertion, edema, orthopnea, palpitations, paroxysmal nocturnal dyspnea or  shortness of breath Dermatological: negative for rash Respiratory: negative for cough or wheezing Urologic: negative for hematuria Abdominal: negative for nausea, vomiting, diarrhea, bright red blood per rectum, melena, or hematemesis Neurologic: negative for visual changes, syncope,  or dizziness All other systems reviewed and are otherwise negative except as noted above.    Blood pressure 110/72, pulse 73, height 6' (1.829 m), weight 196 lb (88.9 kg), SpO2 98 %.  General appearance: alert, cooperative and no distress Neck: no carotid bruit and no JVD Lungs: clear to auscultation bilaterally Heart: regular rate and rhythm Extremities: RUE ecchymosis, good radial pulse Skin: Skin color, texture, turgor normal. No rashes or lesions Neurologic: Grossly normal   ASSESSMENT AND PLAN:   History of ST elevation myocardial infarction (STEMI) Anterior STEMI Dec 2016- late presentation  Ischemic cardiomyopathy EF 40%- will ask Dr Claiborne Billings about starting Entresto  Hyperlipidemia LDL goal <70 LDL in the 20's July 2019  History of testicular cancer S/P orchiectomy. He is on Testosterone replacement  Abnormal TSH Some tachycardia that seems inappropriate considering his dose of Coreg- Holter pending- check TSH and free T4   PLAN  Will discuss with Dr Claiborne Billings. He is OK to start cardiac rehab. TSH and free t4. F/U Dr Claiborne Billings 3 months.   Kerin Ransom PA-C 08/25/2017 1:29 PM

## 2017-08-25 NOTE — Assessment & Plan Note (Signed)
Some tachycardia that seems inappropriate considering his dose of Coreg- Holter pending- check TSH and free T4

## 2017-08-31 ENCOUNTER — Telehealth (HOSPITAL_COMMUNITY): Payer: Self-pay

## 2017-08-31 NOTE — Telephone Encounter (Signed)
2nd attempt to contact patient in regards to Cardiac Rehab - lm on vm

## 2017-09-07 NOTE — Telephone Encounter (Signed)
3rd attempted to call patient in regards to Cardiac Rehab - LM on VM °

## 2017-09-12 NOTE — Progress Notes (Signed)
Cardiology Office Note:    Date:  09/13/2017   ID:  Cesar Woods, DOB 02/14/1965, MRN [id number]  PCP:  Delilah Shan, MD  Cardiologist:  [name], MD   Referring MD: Delilah Shan, MD   Chief Complaint  Patient presents with  . Follow-up    Holter monitor    History of Present Illness:    Cesar Woods is a 52 y.o. male with a hx of of hypertensive heart disease, hx of anterolateral wall STEMI with delayed presentation (pain-free on admission) 06/2017, ACS, chronic systolic heart failure, HTN, and HLD. Heart cath in 2016 with difficult PCI of LAD complicated by spiral dissection and DES x 3 in the ostial to mid LAD. He was last seen in clinic on 03/07/17 by Jory Sims, NP. At that time, he complained of chest discomfort and dypnea while working outside. Myoview was performed and revealed no new areas of ischemia, prior MI scar was visible. Continue long-term DAPT.   He presented to the [facility]/17 to evaluate chest pain and arm pain that happened 3 days prior. He did not have chest pain during exercise.  POC troponin was negative.  It was felt this pain was not cardiac in nature and he was discharged with close cardiology follow up. He was seen in the ER in [facility] for chest pain but left before being evaluated after a long wait and he didn't want to be admitted there. I saw him in follow up on 08/10/17. Decision was made to proceed with heart cath. This was done 08/17/17 by Dr Ellyn Hack and revealed mid LAD ISR. He also has residual occlusion of a small Dx2 with L-L collaterals, 30% RCA and 55% mid OM1.  His EF at cath was 35-45%, echo 08/12/17 showed his EF to be 40%. He was treated with PTCA with good final result.  He followed up with Kerin Ransom PA-C 08/25/17. He had unexpectedly high heart rate and 48-hr holter monitor was placed. He returns to discuss results.   Holter monitor revealed NSR with HR 52-116 bpm. No atrial arrhythmias, no high-grade ectopy.  He states that  he continues to have brief bouts of palpitations.  He denies chest pain, shortness of breath, lower extremity swelling, syncope.  Overall he is doing quite well, although he continues to have anxiety about his cardiac health.  He agrees to go to cardiac rehab.  Having no bleeding issues on aspirin and Brilinta.  Pressure is well controlled today.  He is on ASA, Brilinta, lipitor, coreg 25 mg BID, losartan 25 mg daily.    Past Medical History:  Diagnosis Date  . ACS (acute coronary syndrome) ([place]) 11/2015  . Atypical chest pain    Negative Myoview 2010  . Coronary artery disease    a.s/p STEMI in 01/2015 requiring placement of 4 DES to LAD. b. 11/2015: NST showing apical scar with minimal peri-infarct ischemia.  . Essential hypertension   . Food allergy    Anaphylaxis with shell fish  . GERD (gastroesophageal reflux disease)   . History of migraines   . Hyperlipidemia   . Ischemic cardiomyopathy    a. Echo 05/2015: EF 40-45%, apical akineiss with Grade 1 DD.  [name] Myocardial infarction ([place]) 2016  . Testicle cancer ([place])    In remission - followed by Dr. Risa Grill; had bilateral orchiectomy and XRT in past    Past Surgical History:  Procedure Laterality Date  . CARDIAC CATHETERIZATION N/A 01/15/2015   Procedure: Left Heart  Cath and Coronary Angiography;  Surgeon: Troy Sine, MD;  Location: [facility];  Service: Cardiovascular;  Laterality: N/A;  . CARDIAC CATHETERIZATION  01/15/2015   Procedure: Coronary Stent Intervention;  Surgeon: Troy Sine, MD;  Location: [facility];  Service: Cardiovascular;;  . CORONARY BALLOON ANGIOPLASTY N/A 08/17/2017   Procedure: CORONARY BALLOON ANGIOPLASTY;  Surgeon: Leonie Man, MD;  Location: [facility];  Service: Cardiovascular;  Laterality: N/A;  . LEFT HEART CATH AND CORONARY ANGIOGRAPHY N/A 08/17/2017   Procedure: LEFT HEART CATH AND CORONARY ANGIOGRAPHY;  Surgeon: Leonie Man, MD;  Location: [facility];   Service: Cardiovascular;  Laterality: N/A;  . ORCHIECTOMY Bilateral     Current Medications: Current Meds  Medication Sig  . aspirin EC 81 MG EC tablet Take 1 tablet (81 mg total) by mouth daily.  [name] atorvastatin (LIPITOR) 80 MG tablet TAKE 1 TABLET BY MOUTH DAILY AT 6 PM  . carvedilol (COREG) 25 MG tablet Take 1 tablet (25 mg total) by mouth 2 (two) times daily with a meal.  . EPIPEN 2-PAK 0.3 MG/0.3ML SOAJ injection INJECT 0.3 MLS INTO THE MUSCLE ONCE **PATIENT NEEDS TO SCHEDULE AN OFFICE VISIT**  . losartan (COZAAR) 25 MG tablet Take 1 tablet (25 mg total) by mouth daily.  . nitroGLYCERIN (NITROSTAT) 0.4 MG SL tablet Place 1 tablet (0.4 mg total) under the tongue every 5 (five) minutes x 3 doses as needed for chest pain.  . ticagrelor (BRILINTA) 90 MG TABS tablet Take 1 tablet (90 mg total) by mouth 2 (two) times daily.     Allergies:   Shellfish allergy; Iodine; Penicillins; and Ace inhibitors   Social History   Socioeconomic History  . Marital status: Married    Spouse name: Not on file  . Number of children: Not on file  . Years of education: Not on file  . Highest education level: Not on file  Occupational History  . Not on file  Social Needs  . Financial resource strain: Not on file  . Food insecurity:    Worry: Not on file    Inability: Not on file  . Transportation needs:    Medical: Not on file    Non-medical: Not on file  Tobacco Use  . Smoking status: Former Research scientist (life sciences)  . Smokeless tobacco: Never Used  . Tobacco comment: quit around 2010  Substance and Sexual Activity  . Alcohol use: Yes    Alcohol/week: 0.0 oz    Comment: occasionally  . Drug use: No    Comment: remote use of marijuana, quit a long time ago  . Sexual activity: Not on file  Lifestyle  . Physical activity:    Days per week: Not on file    Minutes per session: Not on file  . Stress: Not on file  Relationships  . Social connections:    Talks on phone: Not on file    Gets together: Not on  file    Attends religious service: Not on file    Active member of club or organization: Not on file    Attends meetings of clubs or organizations: Not on file    Relationship status: Not on file  Other Topics Concern  . Not on file  Social History Narrative   Occupation:Senior Trader for [employer]   Divorced    No children      Former smoker    Alcohol Use - yes  Family History: The patient's family history includes Heart attack in his father and maternal grandmother; Hyperlipidemia in his unknown relative; Hypertension in his unknown relative; Stroke in his paternal grandfather.  ROS:   Please see the history of present illness.    All other systems reviewed and are negative.  EKGs/Labs/Other Studies Reviewed:    The following studies were reviewed today:  48 hr holter 08/23/17: The patient was monitored for 48 hours. The patient was in sinus rhythm throughout the study. Minimum heart rate 52 bpm with maximum heart rate 116 bpm. There are very rare isolated PVCs totaling 7 throughout the 48-hour episode. No high-grade ectopy was demonstrated. There were no atrial arrhythmias. There were no pauses   Left heart cath 08/17/17:  Previously placed Prox-mid LAD drug eluting stents are mostly Patent with the exception of a focal 85% in-stent restenosis lesion at ~Diag2.  Scoring balloon angioplasty was performed. 2.5 X10 WOLVERINE  Post dilation balloon angioplasty was performed using a BALLOON SAPPHIRE [place] 3.0X10.  Post intervention, there is a 5% residual stenosis.  Ost 2nd Diag lesion is 100% stenosed-originally jailed at the time of anterior STEMI. Now fills via retrograde collateral flow from the distal LAD.  Ost 1st Mrg-1 lesion is 40% stenosed. Ost 1st Mrg-2 lesion is 55% stenosed.  There is moderate to severe left ventricular systolic dysfunction -distal anterior hypokinesis with apical aneurysmal dilation and inferoapical hypo-to akinesis.  The left  ventricular ejection fraction is 35-45% by visual estimate.  LV end diastolic pressure is mildly elevated.   Severe focal/isolated in-stent restenosis in the midportion of the extensive proximal to mid LAD stents.  This is roughly at the takeoff of a diagonal branch that was occluded.  The region was treated successfully with scoring balloon angioplasty followed by noncompliant balloon post dilation of the stent.  Moderate to severe cardiomyopathy with anteroapical hypo-to akinesis with aneurysmal dilation.  Plan: Uncomplicated PTCA intervention of in-stent restenosis, the patient stable first same-day discharge.  He is already on aspirin plus Brilinta which will be continued.  He is on stable home medications which he will continue in the outpatient setting.   Recommend dual antiplatelet therapy with Aspirin 81mg  daily long-term (beyond 12 months) because of Extensive LAD disease with multiple overlapping stents and existing disease elsewhere..    Echo 08/12/17: Study Conclusions - Left ventricle: Diffuse hypokinesis with septal and apical   akinesis no mural apical thrombus. The cavity size was mildly   dilated. Wall thickness was increased in a pattern of mild LVH.   There was mild concentric hypertrophy. The estimated ejection   fraction was 40%. Doppler parameters are consistent with abnormal   left ventricular relaxation (grade 1 diastolic dysfunction). - Atrial septum: No defect or patent foramen ovale was identified.   EKG:  EKG is ordered today.  The ekg ordered today demonstrates sinus rhythm, ST changes from old anterior infarct.  Recent Labs: 02/15/2017: ALT 19 08/10/2017: BUN 15; Creatinine, Ser 0.86; Hemoglobin 13.9; Magnesium 2.1; Platelets 195; Potassium 4.0; Sodium 144; TSH 0.401  Recent Lipid Panel    Component Value Date/Time   CHOL 80 (L) 02/15/2017 1030   TRIG 106 02/15/2017 1030   HDL 30 (L) 02/15/2017 1030   CHOLHDL 2.7 02/15/2017 1030   CHOLHDL 4.5  04/16/2015 1028   VLDL 34 (H) 04/16/2015 1028   LDLCALC 29 02/15/2017 1030   LDLDIRECT 99.9 09/15/2012 1738    Physical Exam:    VS:  BP 110/78   Pulse 65  Ht 6' (1.829 m)   Wt 207 lb (93.9 kg)   SpO2 97%   BMI 28.07 kg/m     Wt Readings from Last 3 Encounters:  09/13/17 207 lb (93.9 kg)  08/25/17 196 lb (88.9 kg)  08/17/17 209 lb (94.8 kg)     GEN: Well nourished, well developed in no acute distress HEENT: Normal NECK: No JVD; No carotid bruits CARDIAC: RRR, no murmurs, rubs, gallops RESPIRATORY:  Clear to auscultation without rales, wheezing or rhonchi  ABDOMEN: Soft, non-tender, non-distended MUSCULOSKELETAL:  No edema; No deformity  SKIN: Warm and dry NEUROLOGIC:  Alert and oriented x 3 PSYCHIATRIC:  Normal affect   ASSESSMENT:    1. Palpitations   2. Ischemic cardiomyopathy   3. Chronic combined systolic and diastolic heart failure ([place])   4. CAD S/P percutaneous coronary angioplasty   5. Essential hypertension    PLAN:    In order of problems listed above:  Palpitations - Plan: EKG 12-Lead Holter monitor revealed sinus rhythm with occasional PVCs but no atrial arrhythmias. This is very reassuring. He continues to have brief bouts of palpitations.  His TSH was borderline low.  Repeat TSH and free T4 are pending.  Gust how this may be contributing to his palpitations.  He will try taking supplemental magnesium over-the-counter.  Overall, his Holter monitor results were very reassuring.  Heart rate today was 65 bpm  Ischemic cardiomyopathy Chronic combined systolic and diastolic heart failure ([place])  He is euvolemic on exam and is asymptomatic.  Continue current medications.  CAD S/P percutaneous coronary angioplasty He is doing well without bleeding issues on aspirin and Brilinta.  Continue Coreg, losartan, Lipitor.  Essential hypertension Pressure is well controlled today.  No medication changes.   Follow-up in 3 months.   Medication Adjustments/Labs  and Tests Ordered: Current medicines are reviewed at length with the patient today.  Concerns regarding medicines are outlined above.  Orders Placed This Encounter  Procedures  . EKG 12-Lead   No orders of the defined types were placed in this encounter.   Signed, Ledora Bottcher, PA  09/13/2017 10:05 AM    [place]

## 2017-09-13 ENCOUNTER — Encounter: Payer: Self-pay | Admitting: Physician Assistant

## 2017-09-13 ENCOUNTER — Ambulatory Visit: Payer: BLUE CROSS/BLUE SHIELD | Admitting: Physician Assistant

## 2017-09-13 VITALS — BP 110/78 | HR 65 | Ht 72.0 in | Wt 207.0 lb

## 2017-09-13 DIAGNOSIS — R002 Palpitations: Secondary | ICD-10-CM | POA: Diagnosis not present

## 2017-09-13 DIAGNOSIS — I5042 Chronic combined systolic (congestive) and diastolic (congestive) heart failure: Secondary | ICD-10-CM

## 2017-09-13 DIAGNOSIS — I2510 Atherosclerotic heart disease of native coronary artery without angina pectoris: Secondary | ICD-10-CM

## 2017-09-13 DIAGNOSIS — I255 Ischemic cardiomyopathy: Secondary | ICD-10-CM

## 2017-09-13 DIAGNOSIS — Z9861 Coronary angioplasty status: Secondary | ICD-10-CM

## 2017-09-13 DIAGNOSIS — I10 Essential (primary) hypertension: Secondary | ICD-10-CM

## 2017-09-13 NOTE — Patient Instructions (Signed)
NO CHANGE WITH MEDICATIONS    Your physician wants you to follow-up in [place].You will receive a reminder letter in the mail two months in advance. If you don't receive a letter, please call our office to schedule the follow-up appointment.

## 2017-09-14 NOTE — Telephone Encounter (Signed)
No response  from patient - closed referral °

## 2017-09-15 ENCOUNTER — Other Ambulatory Visit: Payer: Self-pay | Admitting: *Deleted

## 2017-09-15 MED ORDER — TICAGRELOR 90 MG PO TABS: 90.0000 mg | ORAL_TABLET | Freq: Two times a day (BID) | ORAL | 1 refills | Status: DC

## 2017-09-19 DIAGNOSIS — R7989 Other specified abnormal findings of blood chemistry: Secondary | ICD-10-CM | POA: Diagnosis not present

## 2017-11-08 ENCOUNTER — Emergency Department (HOSPITAL_COMMUNITY): Payer: BLUE CROSS/BLUE SHIELD

## 2017-11-08 ENCOUNTER — Observation Stay (HOSPITAL_COMMUNITY)
Admission: EM | Admit: 2017-11-08 | Discharge: 2017-11-10 | Disposition: A | Discharge status: Home / Self Care | Payer: BLUE CROSS/BLUE SHIELD | Attending: Internal Medicine | Admitting: Internal Medicine

## 2017-11-08 ENCOUNTER — Encounter (HOSPITAL_COMMUNITY): Payer: Self-pay | Admitting: Emergency Medicine

## 2017-11-08 ENCOUNTER — Other Ambulatory Visit: Payer: Self-pay

## 2017-11-08 DIAGNOSIS — Z823 Family history of stroke: Secondary | ICD-10-CM | POA: Insufficient documentation

## 2017-11-08 DIAGNOSIS — Z880 Allergy status to penicillin: Secondary | ICD-10-CM | POA: Diagnosis not present

## 2017-11-08 DIAGNOSIS — Z8249 Family history of ischemic heart disease and other diseases of the circulatory system: Secondary | ICD-10-CM | POA: Diagnosis not present

## 2017-11-08 DIAGNOSIS — I110 Hypertensive heart disease with heart failure: Secondary | ICD-10-CM | POA: Diagnosis not present

## 2017-11-08 DIAGNOSIS — I255 Ischemic cardiomyopathy: Secondary | ICD-10-CM | POA: Diagnosis not present

## 2017-11-08 DIAGNOSIS — K219 Gastro-esophageal reflux disease without esophagitis: Secondary | ICD-10-CM | POA: Diagnosis not present

## 2017-11-08 DIAGNOSIS — Z8547 Personal history of malignant neoplasm of testis: Secondary | ICD-10-CM

## 2017-11-08 DIAGNOSIS — Z955 Presence of coronary angioplasty implant and graft: Secondary | ICD-10-CM | POA: Insufficient documentation

## 2017-11-08 DIAGNOSIS — E785 Hyperlipidemia, unspecified: Secondary | ICD-10-CM | POA: Diagnosis not present

## 2017-11-08 DIAGNOSIS — Z91013 Allergy to seafood: Secondary | ICD-10-CM | POA: Insufficient documentation

## 2017-11-08 DIAGNOSIS — Z9861 Coronary angioplasty status: Secondary | ICD-10-CM

## 2017-11-08 DIAGNOSIS — Z7982 Long term (current) use of aspirin: Secondary | ICD-10-CM | POA: Insufficient documentation

## 2017-11-08 DIAGNOSIS — I5042 Chronic combined systolic (congestive) and diastolic (congestive) heart failure: Secondary | ICD-10-CM | POA: Diagnosis not present

## 2017-11-08 DIAGNOSIS — Z79899 Other long term (current) drug therapy: Secondary | ICD-10-CM | POA: Insufficient documentation

## 2017-11-08 DIAGNOSIS — R079 Chest pain, unspecified: Secondary | ICD-10-CM | POA: Diagnosis not present

## 2017-11-08 DIAGNOSIS — Z87891 Personal history of nicotine dependence: Secondary | ICD-10-CM | POA: Diagnosis not present

## 2017-11-08 DIAGNOSIS — I252 Old myocardial infarction: Secondary | ICD-10-CM | POA: Insufficient documentation

## 2017-11-08 DIAGNOSIS — Z9079 Acquired absence of other genital organ(s): Secondary | ICD-10-CM | POA: Insufficient documentation

## 2017-11-08 DIAGNOSIS — Z888 Allergy status to other drugs, medicaments and biological substances status: Secondary | ICD-10-CM | POA: Diagnosis not present

## 2017-11-08 DIAGNOSIS — I10 Essential (primary) hypertension: Secondary | ICD-10-CM | POA: Diagnosis present

## 2017-11-08 DIAGNOSIS — I2510 Atherosclerotic heart disease of native coronary artery without angina pectoris: Secondary | ICD-10-CM

## 2017-11-08 DIAGNOSIS — Z923 Personal history of irradiation: Secondary | ICD-10-CM | POA: Insufficient documentation

## 2017-11-08 DIAGNOSIS — I25119 Atherosclerotic heart disease of native coronary artery with unspecified angina pectoris: Secondary | ICD-10-CM | POA: Diagnosis not present

## 2017-11-08 DIAGNOSIS — R072 Precordial pain: Secondary | ICD-10-CM | POA: Diagnosis not present

## 2017-11-08 LAB — CBC
HEMATOCRIT: 43.6 % (ref 39.0–52.0)
Hemoglobin: 14.5 g/dL (ref 13.0–17.0)
MCH: 30.1 pg (ref 26.0–34.0)
MCHC: 33.3 g/dL (ref 30.0–36.0)
MCV: 90.6 fL (ref 78.0–100.0)
Platelets: 194 10*3/uL (ref 150–400)
RBC: 4.81 MIL/uL (ref 4.22–5.81)
RDW: 12.7 % (ref 11.5–15.5)
WBC: 9.9 10*3/uL (ref 4.0–10.5)

## 2017-11-08 LAB — I-STAT TROPONIN, ED: Troponin i, poc: 0 ng/mL (ref 0.00–0.08)

## 2017-11-08 NOTE — ED Triage Notes (Signed)
Pt reports that Sunday he began having left bicep pain and began having intermittent chest pain yesterday. States it is a sharp sensation and that it doesn't feel the same as his previous MI 3 years ago pt also had a stent collapse and repair in July. States that he currently feels nauseated but has not vomited.

## 2017-11-09 ENCOUNTER — Encounter (HOSPITAL_COMMUNITY)
Admission: EM | Disposition: A | Discharge status: Home / Self Care | Payer: Self-pay | Source: Home / Self Care | Attending: Emergency Medicine

## 2017-11-09 DIAGNOSIS — E782 Mixed hyperlipidemia: Secondary | ICD-10-CM

## 2017-11-09 DIAGNOSIS — I2510 Atherosclerotic heart disease of native coronary artery without angina pectoris: Secondary | ICD-10-CM

## 2017-11-09 DIAGNOSIS — I25119 Atherosclerotic heart disease of native coronary artery with unspecified angina pectoris: Secondary | ICD-10-CM

## 2017-11-09 DIAGNOSIS — I25110 Atherosclerotic heart disease of native coronary artery with unstable angina pectoris: Secondary | ICD-10-CM | POA: Diagnosis not present

## 2017-11-09 DIAGNOSIS — R079 Chest pain, unspecified: Secondary | ICD-10-CM | POA: Diagnosis present

## 2017-11-09 DIAGNOSIS — Z8547 Personal history of malignant neoplasm of testis: Secondary | ICD-10-CM

## 2017-11-09 DIAGNOSIS — I5042 Chronic combined systolic (congestive) and diastolic (congestive) heart failure: Secondary | ICD-10-CM | POA: Diagnosis not present

## 2017-11-09 DIAGNOSIS — R072 Precordial pain: Secondary | ICD-10-CM | POA: Diagnosis not present

## 2017-11-09 DIAGNOSIS — Z9861 Coronary angioplasty status: Secondary | ICD-10-CM

## 2017-11-09 DIAGNOSIS — I10 Essential (primary) hypertension: Secondary | ICD-10-CM | POA: Diagnosis not present

## 2017-11-09 DIAGNOSIS — E785 Hyperlipidemia, unspecified: Secondary | ICD-10-CM | POA: Diagnosis present

## 2017-11-09 HISTORY — PX: LEFT HEART CATH AND CORONARY ANGIOGRAPHY: CATH118249

## 2017-11-09 LAB — BASIC METABOLIC PANEL
Anion gap: 10 (ref 5–15)
Anion gap: 9 (ref 5–15)
BUN: 16 mg/dL (ref 6–20)
BUN: 14 mg/dL (ref 6–20)
CHLORIDE: 103 mmol/L (ref 98–111)
CHLORIDE: 107 mmol/L (ref 98–111)
CO2: 25 mmol/L (ref 22–32)
CO2: 24 mmol/L (ref 22–32)
CREATININE: 0.88 mg/dL (ref 0.61–1.24)
Calcium: 9 mg/dL (ref 8.9–10.3)
Calcium: 8.6 mg/dL — ABNORMAL LOW (ref 8.9–10.3)
Creatinine, Ser: 1.02 mg/dL (ref 0.61–1.24)
GFR calc non Af Amer: >60 mL/min (ref >60)
GFR calc non Af Amer: >60 mL/min (ref >60)
GLUCOSE: 87 mg/dL (ref 70–99)
Glucose, Bld: 109 mg/dL — ABNORMAL HIGH (ref 70–99)
POTASSIUM: 3.7 mmol/L (ref 3.5–5.1)
Potassium: 3.6 mmol/L (ref 3.5–5.1)
SODIUM: 138 mmol/L (ref 135–145)
Sodium: 140 mmol/L (ref 135–145)

## 2017-11-09 LAB — CBC
HCT: 45 % (ref 39.0–52.0)
Hemoglobin: 14.7 g/dL (ref 13.0–17.0)
MCH: 29.3 pg (ref 26.0–34.0)
MCHC: 32.7 g/dL (ref 30.0–36.0)
MCV: 89.8 fL (ref 78.0–100.0)
PLATELETS: 167 10*3/uL (ref 150–400)
RBC: 5.01 MIL/uL (ref 4.22–5.81)
RDW: 12.5 % (ref 11.5–15.5)
WBC: 8.8 10*3/uL (ref 4.0–10.5)

## 2017-11-09 LAB — RAPID URINE DRUG SCREEN, HOSP PERFORMED
AMPHETAMINES: NOT DETECTED
BENZODIAZEPINES: NOT DETECTED
Barbiturates: NOT DETECTED
COCAINE: NOT DETECTED
Opiates: NOT DETECTED
TETRAHYDROCANNABINOL: NOT DETECTED

## 2017-11-09 LAB — TROPONIN I
Troponin I: <0.03 ng/mL (ref <0.03)
Troponin I: <0.03 ng/mL (ref <0.03)

## 2017-11-09 LAB — LIPID PANEL
CHOL/HDL RATIO: 3.5 ratio
CHOLESTEROL: 94 mg/dL (ref 0–200)
HDL: 27 mg/dL — AB (ref >40)
LDL Cholesterol: 40 mg/dL (ref 0–99)
Triglycerides: 136 mg/dL (ref <150)
VLDL: 27 mg/dL (ref 0–40)

## 2017-11-09 LAB — HIV ANTIBODY (ROUTINE TESTING W REFLEX): HIV Screen 4th Generation wRfx: NONREACTIVE

## 2017-11-09 LAB — MRSA PCR SCREENING: MRSA BY PCR: NEGATIVE

## 2017-11-09 LAB — BRAIN NATRIURETIC PEPTIDE: B Natriuretic Peptide: 14.6 pg/mL (ref 0.0–100.0)

## 2017-11-09 SURGERY — LEFT HEART CATH AND CORONARY ANGIOGRAPHY
Anesthesia: LOCAL

## 2017-11-09 MED ORDER — ONDANSETRON HCL 4 MG/2ML IJ SOLN: 4.0000 mg | Freq: Four times a day (QID) | INTRAMUSCULAR | Status: DC | PRN

## 2017-11-09 MED ORDER — ASPIRIN 81 MG PO CHEW
81.0000 mg | CHEWABLE_TABLET | Freq: Every day | ORAL | Status: DC
  Administered 2017-11-09 – 2017-11-10 (×2): 81 mg via ORAL
  Filled 2017-11-09 (×2): qty 1

## 2017-11-09 MED ORDER — VERAPAMIL HCL 2.5 MG/ML IV SOLN
INTRAVENOUS | Status: AC
  Filled 2017-11-09: qty 2

## 2017-11-09 MED ORDER — LIDOCAINE HCL (PF) 1 % IJ SOLN
INTRAMUSCULAR | Status: AC
  Filled 2017-11-09: qty 30

## 2017-11-09 MED ORDER — TICAGRELOR 90 MG PO TABS
90.0000 mg | ORAL_TABLET | Freq: Two times a day (BID) | ORAL | Status: DC
  Administered 2017-11-09: 90 mg via ORAL
  Filled 2017-11-09: qty 1

## 2017-11-09 MED ORDER — ATORVASTATIN CALCIUM 80 MG PO TABS
80.0000 mg | ORAL_TABLET | Freq: Every day | ORAL | Status: DC
Start: 2017-11-09 — End: 2017-11-09

## 2017-11-09 MED ORDER — SODIUM CHLORIDE 0.9% FLUSH
3.0000 mL | Freq: Two times a day (BID) | INTRAVENOUS | Status: DC
  Administered 2017-11-09 – 2017-11-10 (×2): 3 mL via INTRAVENOUS

## 2017-11-09 MED ORDER — DIAZEPAM 5 MG PO TABS: 5.0000 mg | ORAL_TABLET | Freq: Four times a day (QID) | ORAL | Status: DC | PRN

## 2017-11-09 MED ORDER — SODIUM CHLORIDE 0.9 % IV SOLN: INTRAVENOUS | Status: DC

## 2017-11-09 MED ORDER — SODIUM CHLORIDE 0.9 % IV SOLN
INTRAVENOUS | Status: AC
  Administered 2017-11-09: 03:00:00 via INTRAVENOUS

## 2017-11-09 MED ORDER — ASPIRIN 81 MG PO CHEW: 81.0000 mg | CHEWABLE_TABLET | Freq: Every day | ORAL | Status: DC

## 2017-11-09 MED ORDER — MIDAZOLAM HCL 2 MG/2ML IJ SOLN
INTRAMUSCULAR | Status: AC
  Filled 2017-11-09: qty 2

## 2017-11-09 MED ORDER — SODIUM CHLORIDE 0.9% FLUSH: 3.0000 mL | INTRAVENOUS | Status: DC | PRN

## 2017-11-09 MED ORDER — HEPARIN (PORCINE) IN NACL 1000-0.9 UT/500ML-% IV SOLN
INTRAVENOUS | Status: AC
  Filled 2017-11-09: qty 1000

## 2017-11-09 MED ORDER — ASPIRIN 81 MG PO CHEW: 81.0000 mg | CHEWABLE_TABLET | ORAL | Status: AC

## 2017-11-09 MED ORDER — ENOXAPARIN SODIUM 40 MG/0.4ML ~~LOC~~ SOLN
40.0000 mg | SUBCUTANEOUS | Status: DC
  Administered 2017-11-09 – 2017-11-10 (×2): 40 mg via SUBCUTANEOUS
  Filled 2017-11-09 (×2): qty 0.4

## 2017-11-09 MED ORDER — LOSARTAN POTASSIUM 25 MG PO TABS
25.0000 mg | ORAL_TABLET | Freq: Every day | ORAL | Status: DC
  Administered 2017-11-09: 25 mg via ORAL
  Filled 2017-11-09 (×3): qty 1

## 2017-11-09 MED ORDER — ASPIRIN 81 MG PO CHEW
324.0000 mg | CHEWABLE_TABLET | Freq: Once | ORAL | Status: AC
  Administered 2017-11-09: 324 mg via ORAL
  Filled 2017-11-09: qty 4

## 2017-11-09 MED ORDER — SODIUM CHLORIDE 0.9 % IV SOLN: 250.0000 mL | INTRAVENOUS | Status: DC | PRN

## 2017-11-09 MED ORDER — ATORVASTATIN CALCIUM 80 MG PO TABS
80.0000 mg | ORAL_TABLET | Freq: Every day | ORAL | Status: DC
  Administered 2017-11-09: 80 mg via ORAL
  Filled 2017-11-09: qty 1

## 2017-11-09 MED ORDER — INFLUENZA VAC SPLIT QUAD 0.5 ML IM SUSY: 0.5000 mL | PREFILLED_SYRINGE | INTRAMUSCULAR | Status: DC

## 2017-11-09 MED ORDER — SODIUM CHLORIDE 0.9 % IV SOLN
INTRAVENOUS | Status: DC
  Administered 2017-11-09: 16:00:00 via INTRAVENOUS

## 2017-11-09 MED ORDER — TESTOSTERONE 50 MG/5GM (1%) TD GEL
5.0000 g | Freq: Every day | TRANSDERMAL | Status: DC
  Administered 2017-11-09 – 2017-11-10 (×2): 5 g via TRANSDERMAL
  Filled 2017-11-09 (×2): qty 5

## 2017-11-09 MED ORDER — NITROGLYCERIN 0.4 MG SL SUBL: 0.4000 mg | SUBLINGUAL_TABLET | SUBLINGUAL | Status: DC | PRN

## 2017-11-09 MED ORDER — CARVEDILOL 25 MG PO TABS
25.0000 mg | ORAL_TABLET | Freq: Two times a day (BID) | ORAL | Status: DC
  Administered 2017-11-09 – 2017-11-10 (×3): 25 mg via ORAL
  Filled 2017-11-09 (×3): qty 1

## 2017-11-09 MED ORDER — FENTANYL CITRATE (PF) 100 MCG/2ML IJ SOLN
INTRAMUSCULAR | Status: DC | PRN
  Administered 2017-11-09 (×2): 25 ug via INTRAVENOUS

## 2017-11-09 MED ORDER — HEPARIN SODIUM (PORCINE) 5000 UNIT/ML IJ SOLN: 5000.0000 [IU] | Freq: Three times a day (TID) | INTRAMUSCULAR | Status: DC

## 2017-11-09 MED ORDER — LIDOCAINE HCL (PF) 1 % IJ SOLN
INTRAMUSCULAR | Status: DC | PRN
  Administered 2017-11-09: 2 mL via INTRADERMAL

## 2017-11-09 MED ORDER — HYDRALAZINE HCL 20 MG/ML IJ SOLN: 5.0000 mg | INTRAMUSCULAR | Status: DC | PRN

## 2017-11-09 MED ORDER — EPINEPHRINE 0.3 MG/0.3ML IJ SOAJ
0.3000 mg | Freq: Every day | INTRAMUSCULAR | Status: DC | PRN
  Filled 2017-11-09: qty 0.3

## 2017-11-09 MED ORDER — MIDAZOLAM HCL 2 MG/2ML IJ SOLN
INTRAMUSCULAR | Status: DC | PRN
  Administered 2017-11-09: 1 mg via INTRAVENOUS
  Administered 2017-11-09: 2 mg via INTRAVENOUS

## 2017-11-09 MED ORDER — HEPARIN SODIUM (PORCINE) 1000 UNIT/ML IJ SOLN
INTRAMUSCULAR | Status: DC | PRN
  Administered 2017-11-09: 4500 [IU] via INTRAVENOUS

## 2017-11-09 MED ORDER — IOHEXOL 350 MG/ML SOLN
INTRAVENOUS | Status: DC | PRN
  Administered 2017-11-09: 90 mL via INTRA_ARTERIAL

## 2017-11-09 MED ORDER — TICAGRELOR 90 MG PO TABS
90.0000 mg | ORAL_TABLET | Freq: Two times a day (BID) | ORAL | Status: DC
  Administered 2017-11-09 – 2017-11-10 (×2): 90 mg via ORAL
  Filled 2017-11-09 (×2): qty 1

## 2017-11-09 MED ORDER — ACETAMINOPHEN 325 MG PO TABS: 650.0000 mg | ORAL_TABLET | ORAL | Status: DC | PRN

## 2017-11-09 MED ORDER — SODIUM CHLORIDE 0.9% FLUSH: 3.0000 mL | Freq: Two times a day (BID) | INTRAVENOUS | Status: DC

## 2017-11-09 MED ORDER — HEPARIN (PORCINE) IN NACL 1000-0.9 UT/500ML-% IV SOLN
INTRAVENOUS | Status: DC | PRN
  Administered 2017-11-09 (×2): 500 mL

## 2017-11-09 MED ORDER — VERAPAMIL HCL 2.5 MG/ML IV SOLN
INTRAVENOUS | Status: DC | PRN
  Administered 2017-11-09: 10 mL via INTRA_ARTERIAL

## 2017-11-09 MED ORDER — MORPHINE SULFATE (PF) 2 MG/ML IV SOLN: 2.0000 mg | INTRAVENOUS | Status: DC | PRN

## 2017-11-09 MED ORDER — FENTANYL CITRATE (PF) 100 MCG/2ML IJ SOLN
INTRAMUSCULAR | Status: AC
  Filled 2017-11-09: qty 2

## 2017-11-09 MED ORDER — ZOLPIDEM TARTRATE 5 MG PO TABS
5.0000 mg | ORAL_TABLET | Freq: Every evening | ORAL | Status: DC | PRN
  Filled 2017-11-09: qty 1

## 2017-11-09 SURGICAL SUPPLY — 13 items
CATH INFINITI 5FR ANG PIGTAIL (CATHETERS) ×1 IMPLANT
CATH INFINITI JR4 5F (CATHETERS) ×1 IMPLANT
CATH OPTITORQUE JACKY 4.0 5F (CATHETERS) ×1 IMPLANT
CATH OPTITORQUE TIG 4.0 5F (CATHETERS) ×1 IMPLANT
DEVICE RAD COMP TR BAND LRG (VASCULAR PRODUCTS) ×1 IMPLANT
GLIDESHEATH SLEND SS 6F .021 (SHEATH) ×1 IMPLANT
GUIDEWIRE INQWIRE 1.5J.035X260 (WIRE) IMPLANT
INQWIRE 1.5J .035X260CM (WIRE) ×2
KIT HEART LEFT (KITS) ×2 IMPLANT
PACK CARDIAC CATHETERIZATION (CUSTOM PROCEDURE TRAY) ×2 IMPLANT
SYR MEDRAD MARK V 150ML (SYRINGE) ×2 IMPLANT
TRANSDUCER W/STOPCOCK (MISCELLANEOUS) ×2 IMPLANT
TUBING CIL FLEX 10 FLL-RA (TUBING) ×2 IMPLANT

## 2017-11-09 NOTE — H&P (View-Only) (Signed)
Cardiology Consultation:   Patient ID: Cesar Woods MRN: [id number]; DOB: [birth year]  Admit date: 11/08/2017 Date of Consult: 11/09/2017  Primary Care Provider: Delilah Shan, MD Primary Cardiologist: [name], MD  Primary Electrophysiologist:  None    Patient Profile:   Cesar Woods is a 52 y.o. male with a hx of hypertensive heart disease, hx of anterolateral wall STEMI with delayed presentation 01/2015 with stents x 3 to LAD, chronic systolic heart failure, HTN, and HLD who is being seen today for the evaluation of chest pain at the request of Dr. Nevada Crane.  History of Present Illness:   Cesar Woods has a complex cardiac history that includes out of hospital anterior MI 01/2015 with three stents placed to LAD and subsequent reduction in LVEF of 40-45% post-MI. He was seen in clinic for recurrent chest pain in 08/2017 and scheduled for a repeat heart cath. This was done 08/17/17 by Dr Ellyn Hack and revealed mid LAD ISR of 85%. He also has residual occlusion of a small Dx2 with L-L collaterals, 30% RCA and 55% mid OM1.  His EF at cath was 35-45%, echo 08/12/17 showed his EF to be 40%. He was treated with PTCA with good final result. He tolerated the procedure well and was seen in clinic for follow up. He continues on DAPT with ASA and brilinta. He is complaint on medications.  He presented back to [facility] with chest pain. On my interview, he describes 2/10 left arm pain on Sunday night that was constant in nature. The pain lasted until he went to sleep and did not recur the next morning. On Monday evening, he experienced intermittent sharp chest pain. This occurred several times, lasting only minutes at a time. The pain did not keep him from sleeping. In fact, he went to sleep approximately 3 hrs earlier that evening than normal. On Tues night, he experienced the pain again and reported to the [facility]. He reports having sharp chest pain about every hour, lasting only minutes, without radiation and  no associated symptoms. He was nauseous in the ER secondary to a blood draw. His weight has been stable and he appears euvolemic on exam. This is the same chest pain he experienced prior to his PCI in July of this year.  He was admitted for observation. Troponins were cycled and have been negative. EKG on arrival with TWI V4/5, repeat EKG with sinus rhythm. He has not received nitro. He did receive 324 mg ASA. Brilinta has been continued.   Past Medical History:  Diagnosis Date  . ACS (acute coronary syndrome) ([place]) 11/2015  . Atypical chest pain    Negative Myoview 2010  . Coronary artery disease    a.s/p STEMI in 01/2015 requiring placement of 4 DES to LAD. b. 11/2015: NST showing apical scar with minimal peri-infarct ischemia.  . Essential hypertension   . Food allergy    Anaphylaxis with shell fish  . GERD (gastroesophageal reflux disease)   . History of migraines   . Hyperlipidemia   . Ischemic cardiomyopathy    a. Echo 05/2015: EF 40-45%, apical akineiss with Grade 1 DD.  [name] Myocardial infarction ([place]) 2016  . Testicle cancer ([place])    In remission - followed by Dr. Risa Grill; had bilateral orchiectomy and XRT in past    Past Surgical History:  Procedure Laterality Date  . CARDIAC CATHETERIZATION N/A 01/15/2015   Procedure: Left Heart Cath and Coronary Angiography;  Surgeon: Troy Sine, MD;  Location: [place] [facility];  Service: Cardiovascular;  Laterality: N/A;  . CARDIAC CATHETERIZATION  01/15/2015   Procedure: Coronary Stent Intervention;  Surgeon: Troy Sine, MD;  Location: [place] [facility];  Service: Cardiovascular;;  . CORONARY BALLOON ANGIOPLASTY N/A 08/17/2017   Procedure: CORONARY BALLOON ANGIOPLASTY;  Surgeon: Leonie Man, MD;  Location: [place] [facility];  Service: Cardiovascular;  Laterality: N/A;  . LEFT HEART CATH AND CORONARY ANGIOGRAPHY N/A 08/17/2017   Procedure: LEFT HEART CATH AND CORONARY ANGIOGRAPHY;  Surgeon: Leonie Man, MD;  Location: [place] [facility];  Service: Cardiovascular;  Laterality: N/A;  . ORCHIECTOMY Bilateral      Home Medications:  Prior to Admission medications   Medication Sig Start Date End Date Taking? Authorizing Provider  aspirin EC 81 MG EC tablet Take 1 tablet (81 mg total) by mouth daily. 01/18/15  Yes Brett Canales, PA-C  atorvastatin (LIPITOR) 80 MG tablet TAKE 1 TABLET BY MOUTH DAILY AT 6 PM Patient taking differently: Take 80 mg by mouth daily at 6 PM.  03/07/17  Yes Lendon Colonel, NP  carvedilol (COREG) 25 MG tablet Take 1 tablet (25 mg total) by mouth 2 (two) times daily with a meal. 03/07/17  Yes Lendon Colonel, NP  EPIPEN 2-PAK 0.3 MG/0.3ML SOAJ injection INJECT 0.3 MLS INTO THE MUSCLE ONCE **PATIENT NEEDS TO SCHEDULE AN OFFICE VISIT** Patient taking differently: Inject 0.3 mg into the muscle daily as needed (allergic reaction).  06/12/13  Yes Yoo, Doe-Hyun R, DO  losartan (COZAAR) 25 MG tablet Take 1 tablet (25 mg total) by mouth daily. 03/07/17  Yes Lendon Colonel, NP  nitroGLYCERIN (NITROSTAT) 0.4 MG SL tablet Place 1 tablet (0.4 mg total) under the tongue every 5 (five) minutes x 3 doses as needed for chest pain. 03/07/17  Yes Lendon Colonel, NP  testosterone (ANDROGEL) 50 MG/5GM (1%) GEL Place 5 g onto the skin daily.   Yes [provider]  ticagrelor (BRILINTA) 90 MG TABS tablet Take 1 tablet (90 mg total) by mouth 2 (two) times daily. 09/15/17  Yes Troy Sine, MD    Inpatient Medications: Scheduled Meds: . aspirin  81 mg Oral Daily  . atorvastatin  80 mg Oral q1800  . carvedilol  25 mg Oral BID WC  . enoxaparin (LOVENOX) injection  40 mg Subcutaneous Q24H  . [START ON 11/10/2017] Influenza vac split quadrivalent PF  0.5 mL Intramuscular Tomorrow-1000  . losartan  25 mg Oral Daily  . testosterone  5 g Transdermal Daily  . ticagrelor  90 mg Oral BID   Continuous Infusions:  PRN Meds: acetaminophen, EPINEPHrine, hydrALAZINE, morphine injection,  nitroGLYCERIN, ondansetron (ZOFRAN) IV, zolpidem  Allergies:    Allergies  Allergen Reactions  . Iodine Anaphylaxis    Shellfish allergy   . Shellfish Allergy Anaphylaxis  . Penicillins Other (See Comments)    Childhood allergy Has patient had a PCN reaction causing immediate rash, facial/tongue/throat swelling, SOB or lightheadedness with hypotension: Unknown Has patient had a PCN reaction causing severe rash involving mucus membranes or skin necrosis: Unknown Has patient had a PCN reaction that required hospitalization: No Has patient had a PCN reaction occurring within the last 10 years: No If all of the above answers are "NO", then may proceed with Cephalosporin use.    . Ace Inhibitors Cough    Social History:   Social History   Socioeconomic History  . Marital status: Married    Spouse name: Not on file  . Number of  children: Not on file  . Years of education: Not on file  . Highest education level: Not on file  Occupational History  . Not on file  Social Needs  . Financial resource strain: Not on file  . Food insecurity:    Worry: Not on file    Inability: Not on file  . Transportation needs:    Medical: Not on file    Non-medical: Not on file  Tobacco Use  . Smoking status: Former Research scientist (life sciences)  . Smokeless tobacco: Never Used  . Tobacco comment: quit around 2010  Substance and Sexual Activity  . Alcohol use: Yes    Alcohol/week: 0.0 standard drinks    Comment: occasionally  . Drug use: No    Comment: remote use of marijuana, quit a long time ago  . Sexual activity: Not on file  Lifestyle  . Physical activity:    Days per week: Not on file    Minutes per session: Not on file  . Stress: Not on file  Relationships  . Social connections:    Talks on phone: Not on file    Gets together: Not on file    Attends religious service: Not on file    Active member of club or organization: Not on file    Attends meetings of clubs or organizations: Not on file     Relationship status: Not on file  . Intimate partner violence:    Fear of current or ex partner: Not on file    Emotionally abused: Not on file    Physically abused: Not on file    Forced sexual activity: Not on file  Other Topics Concern  . Not on file  Social History Narrative   Occupation:Senior Trader for [employer]   Divorced    No children      Former smoker    Alcohol Use - yes          Family History:    Family History  Problem Relation Age of Onset  . Heart attack Father        CABG at agae 68  . Stroke Paternal Grandfather   . Hypertension Unknown   . Hyperlipidemia Unknown   . Heart attack Maternal Grandmother      ROS:  Please see the history of present illness.   All other ROS reviewed and negative.     Physical Exam/Data:   Vitals:   11/09/17 0131 11/09/17 0200 11/09/17 0458 11/09/17 0810  BP: (!) 122/96 130/87  110/89  Pulse: 68 70  74  Resp: (!) 21 18  (!) 23  Temp:  98 F (36.7 C)  98 F (36.7 C)  TempSrc:    Oral  SpO2: 96% 94%  95%  Weight:   92.9 kg   Height:   6' (1.829 m)     Intake/Output Summary (Last 24 hours) at 11/09/2017 1026 Last data filed at 11/09/2017 0900 Gross per 24 hour  Intake 350.4 ml  Output -  Net 350.4 ml   Filed Weights   11/08/17 2322 11/09/17 0458  Weight: 93 kg 92.9 kg   Body mass index is 27.78 kg/m.  General:  Well nourished, well developed, in no acute distress HEENT: normal Neck: no JVD Vascular: No carotid bruits Cardiac:  normal S1, S2; RRR; no murmur  Lungs:  clear to auscultation bilaterally, no wheezing, rhonchi or rales  Abd: soft, nontender, no hepatomegaly  Ext: no edema Musculoskeletal:  No deformities, BUE and BLE strength normal and equal  Skin: warm and dry  Neuro:  CNs 2-12 intact, no focal abnormalities noted Psych:  Normal affect   EKG:  The EKG was personally reviewed and demonstrates:  sinus Telemetry:  Telemetry was personally reviewed and demonstrates:  sinus  Relevant CV  Studies:  Left heart cath 08/17/17:  Previously placed Prox-mid LAD drug eluting stents are mostly Patent with the exception of a focal 85% in-stent restenosis lesion at ~Diag2.  Scoring balloon angioplasty was performed. 2.5 X10 WOLVERINE  Post dilation balloon angioplasty was performed using a BALLOON SAPPHIRE Box 3.0X10.  Post intervention, there is a 5% residual stenosis.  Ost 2nd Diag lesion is 100% stenosed-originally jailed at the time of anterior STEMI. Now fills via retrograde collateral flow from the distal LAD.  Ost 1st Mrg-1 lesion is 40% stenosed. Ost 1st Mrg-2 lesion is 55% stenosed.  There is moderate to severe left ventricular systolic dysfunction -distal anterior hypokinesis with apical aneurysmal dilation and inferoapical hypo-to akinesis.  The left ventricular ejection fraction is 35-45% by visual estimate.  LV end diastolic pressure is mildly elevated.   Severe focal/isolated in-stent restenosis in the midportion of the extensive proximal to mid LAD stents.  This is roughly at the takeoff of a diagonal branch that was occluded.  The region was treated successfully with scoring balloon angioplasty followed by noncompliant balloon post dilation of the stent.  Moderate to severe cardiomyopathy with anteroapical hypo-to akinesis with aneurysmal dilation.  Plan: Uncomplicated PTCA intervention of in-stent restenosis, the patient stable first same-day discharge.  He is already on aspirin plus Brilinta which will be continued.  He is on stable home medications which he will continue in the outpatient setting.   Echo 08/12/17: Study Conclusions - Left ventricle: Diffuse hypokinesis with septal and apical   akinesis no mural apical thrombus. The cavity size was mildly   dilated. Wall thickness was increased in a pattern of mild LVH.   There was mild concentric hypertrophy. The estimated ejection   fraction was 40%. Doppler parameters are consistent with abnormal    left ventricular relaxation (grade 1 diastolic dysfunction). - Atrial septum: No defect or patent foramen ovale was identified.   Laboratory Data:  Chemistry Recent Labs  Lab 11/08/17 2320  NA 138  K 3.7  CL 103  CO2 25  GLUCOSE 109*  BUN 16  CREATININE 1.02  CALCIUM 9.0  GFRNONAA >60  GFRAA >60  ANIONGAP 10    No results for input(s): PROT, ALBUMIN, AST, ALT, ALKPHOS, BILITOT in the last 168 hours. Hematology Recent Labs  Lab 11/08/17 2320  WBC 9.9  RBC 4.81  HGB 14.5  HCT 43.6  MCV 90.6  MCH 30.1  MCHC 33.3  RDW 12.7  PLT 194   Cardiac Enzymes Recent Labs  Lab 11/09/17 0356 11/09/17 0824  TROPONINI <0.03 <0.03    Recent Labs  Lab 11/08/17 2341  TROPIPOC 0.00    BNP Recent Labs  Lab 11/09/17 0356  BNP 14.6    DDimer No results for input(s): DDIMER in the last 168 hours.  Radiology/Studies:  Dg Chest 2 View  Result Date: 11/08/2017 CLINICAL DATA:  Chest pain EXAM: CHEST - 2 VIEW COMPARISON:  06/29/2017 FINDINGS: The heart size and mediastinal contours are within normal limits. Both lungs are clear. The visualized skeletal structures are unremarkable. IMPRESSION: No active cardiopulmonary disease. Electronically Signed   By: Donavan Foil M.D.   On: 11/08/2017 23:39    Assessment and Plan:   1. Chest pain - troponin x  3 negative - EKG with TWI V4/5, now resolved - he continues to have intermittent sharp chest pain nearly every hour, although this pain did not wake him from sleep overnight - this seems to be his anginal equivalent - given his history of in-stent restenosis, recommend repeat angiography  - he has been compliant on home medications, including DAPT   2. Chronic systolic and diastolic heart failure, ischemic cardiomyopathy - echo with EF of 40% and grade 1 DD - weight is stable, does not require diuretics at home - euvolemic on exam, laying flat in bed   3. HTN - pressures are controlled on present regimen - coreg,  losartan   4. HLD 11/09/2017: Cholesterol 94; HDL 27; LDL Cholesterol 40; Triglycerides 136; VLDL 27 - continue lipitor      For questions or updates, please contact [place] Please consult [email or web address] for contact info under     Signed, Ledora Bottcher, PA  11/09/2017 10:26 AM   I have examined the patient and reviewed assessment and plan and discussed with patient.  Agree with above as stated.  Patient with known CAD, and now unstable angina sx.  THese sx are similar to what he had in July 2019.  Plan for repeat cath today.  He is agreeable.  All questions answered.  PTCA in 7/19.  Could have developed restenosis since no stent was placed at that time.   COntinue aggressive secondary prevention.   Larae Grooms

## 2017-11-09 NOTE — H&P (Signed)
History and Physical    Cesar Woods YYT:[id number] DOB: 13-Jan-1966 DOA: 11/08/2017  Referring MD/NP/PA:   PCP: Delilah Shan, MD   Patient coming from:  The patient is coming from home.  At baseline, pt is independent for most of ADL.   Chief Complaint: chest pain  HPI: Cesar Woods is a 52 y.o. male with medical history significant of hypertension, hyperlipidemia, GERD, CAD, STEMI, stent placement (restented July 2019), migraine headaches, CHF with EF 40%, testicular cancer in remission (orchidectomy, radiation therapy), who presents with chest pain.  Patient states that he started having left upper arm pain 3 days ago, which is located in the bicep area, constant, mild, 2 out of 10 severity, nonradiating.  No injury.  No arm swelling or numbness.  Since last night, he started having chest pain, which is located in substernal area, sharp, initially 4 out of 10 severity, currently chest pain-free, nonradiating.  It is not associated with shortness of breath or cough.  No fever or chills.  No recent long distant traveling or tenderness in the calf areas.  Patient has nausea, but no vomiting, abdominal pain or diarrhea today.  He states that he had one episode of loose stool BM yesterday.  Denies symptoms of UTI or unilateral weakness.  Of note, due to chest pain, patient was re-stented for in-stent restenosis in July. He is currently taking dual antiplatelet therapy with Aspirin and brilinta.   08/17/17 cardiac cath by Dr. Ardeth Sportsman:  Previously placed Prox-mid LAD drug eluting stents are mostly Patent with the exception of a focal 85% in-stent restenosis lesion at ~Diag2.  Scoring balloon angioplasty was performed. 2.5 X10 [place]OL[place]  Post dilation balloon angioplasty was performed using a BALLOON SAPPHIRE [place] 3.0X10.  Post intervention, there is a 5% residual stenosis.  Ost 2nd Diag lesion is 100% stenosed-originally jailed at the time of anterior STEMI. Now fills via retrograde  collateral flow from the distal LAD.  Ost 1st Mrg-1 lesion is 40% stenosed. Ost 1st Mrg-2 lesion is 55% stenosed.  There is moderate to severe left ventricular systolic dysfunction -distal anterior hypokinesis with apical aneurysmal dilation and inferoapical hypo-to akinesis.  The left ventricular ejection fraction is 35-45% by visual estimate.  LV end diastolic pressure is mildly elevated.  Severe focal/isolated in-stent restenosis in the midportion of the extensive proximal to mid LAD stents.  This is roughly at the takeoff of a diagonal branch that was occluded.  The region was treated successfully with scoring balloon angioplasty followed by noncompliant balloon post dilation of the stent. Moderate to severe cardiomyopathy with anteroapical hypo-to akinesis with aneurysmal dilation.   ED Course: pt was found to have negative troponin, WBC 9.9, electrolytes renal function okay, temperature normal, no tachycardia, oxygen saturation 93 to 96% on room air, chest x-ray negative.  Patient is placed on telemetry bed of observation.  Review of Systems:   General: no fevers, chills, no body weight gain, fatigue HEENT: no blurry vision, hearing changes or sore throat Respiratory: no dyspnea, coughing, wheezing CV: has chest pain, no palpitations GI: no nausea, vomiting, abdominal pain, diarrhea, constipation GU: no dysuria, burning on urination, increased urinary frequency, hematuria  Ext: no leg edema Neuro: no unilateral weakness, numbness, or tingling, no vision change or hearing loss Skin: no rash, no skin tear. MSK: No muscle spasm, no deformity, no limitation of range of movement in spin Heme: No easy bruising.  Travel history: No recent long distant travel.  Allergy:  Allergies  Allergen Reactions  .  Iodine Anaphylaxis    Shellfish allergy   . Shellfish Allergy Anaphylaxis  . Penicillins Other (See Comments)    Childhood allergy Has patient had a PCN reaction causing immediate  rash, facial/tongue/throat swelling, SOB or lightheadedness with hypotension: Unknown Has patient had a PCN reaction causing severe rash involving mucus membranes or skin necrosis: Unknown Has patient had a PCN reaction that required hospitalization: No Has patient had a PCN reaction occurring within the last 10 years: No If all of the above answers are "NO", then may proceed with Cephalosporin use.    . Ace Inhibitors Cough    Past Medical History:  Diagnosis Date  . ACS (acute coronary syndrome) ([place]) 11/2015  . Atypical chest pain    Negative Myoview 2010  . Coronary artery disease    a.s/p STEMI in 01/2015 requiring placement of 4 DES to LAD. b. 11/2015: NST showing apical scar with minimal peri-infarct ischemia.  . Essential hypertension   . Food allergy    Anaphylaxis with shell fish  . GERD (gastroesophageal reflux disease)   . History of migraines   . Hyperlipidemia   . Ischemic cardiomyopathy    a. Echo 05/2015: EF 40-45%, apical akineiss with Grade 1 DD.  [name] Myocardial infarction ([place]) 2016  . Testicle cancer ([place])    In remission - followed by Dr. Risa Grill; had bilateral orchiectomy and XRT in past    Past Surgical History:  Procedure Laterality Date  . CARDIAC CATHETERIZATION N/A 01/15/2015   Procedure: Left Heart Cath and Coronary Angiography;  Surgeon: Troy Sine, MD;  Location: [facility];  Service: Cardiovascular;  Laterality: N/A;  . CARDIAC CATHETERIZATION  01/15/2015   Procedure: Coronary Stent Intervention;  Surgeon: Troy Sine, MD;  Location: [facility];  Service: Cardiovascular;;  . CORONARY BALLOON ANGIOPLASTY N/A 08/17/2017   Procedure: CORONARY BALLOON ANGIOPLASTY;  Surgeon: Leonie Man, MD;  Location: [facility];  Service: Cardiovascular;  Laterality: N/A;  . LEFT HEART CATH AND CORONARY ANGIOGRAPHY N/A 08/17/2017   Procedure: LEFT HEART CATH AND CORONARY ANGIOGRAPHY;  Surgeon: Leonie Man, MD;  Location: [facility];  Service: Cardiovascular;  Laterality: N/A;  . ORCHIECTOMY Bilateral     Social History:  reports that he has quit smoking. He has never used smokeless tobacco. He reports that he drinks alcohol. He reports that he does not use drugs.  Family History:  Family History  Problem Relation Age of Onset  . Heart attack Father        CABG at agae 68  . Stroke Paternal Grandfather   . Hypertension Unknown   . Hyperlipidemia Unknown   . Heart attack Maternal Grandmother      Prior to Admission medications   Medication Sig Start Date End Date Taking? Authorizing Provider  aspirin EC 81 MG EC tablet Take 1 tablet (81 mg total) by mouth daily. 01/18/15  Yes Brett Canales, PA-C  atorvastatin (LIPITOR) 80 MG tablet TAKE 1 TABLET BY MOUTH DAILY AT 6 PM Patient taking differently: Take 80 mg by mouth daily at 6 PM.  03/07/17  Yes Lendon Colonel, NP  carvedilol (COREG) 25 MG tablet Take 1 tablet (25 mg total) by mouth 2 (two) times daily with a meal. 03/07/17  Yes Lendon Colonel, NP  EPIPEN 2-PAK 0.3 MG/0.3ML SOAJ injection INJECT 0.3 MLS INTO THE MUSCLE ONCE **PATIENT NEEDS TO SCHEDULE AN OFFICE VISIT** Patient taking differently: Inject 0.3 mg into the muscle  daily as needed (allergic reaction).  06/12/13  Yes Yoo, Doe-Hyun R, DO  losartan (COZAAR) 25 MG tablet Take 1 tablet (25 mg total) by mouth daily. 03/07/17  Yes Lendon Colonel, NP  nitroGLYCERIN (NITROSTAT) 0.4 MG SL tablet Place 1 tablet (0.4 mg total) under the tongue every 5 (five) minutes x 3 doses as needed for chest pain. 03/07/17  Yes Lendon Colonel, NP  testosterone (ANDROGEL) 50 MG/5GM (1%) GEL Place 5 g onto the skin daily.   Yes [provider]  ticagrelor (BRILINTA) 90 MG TABS tablet Take 1 tablet (90 mg total) by mouth 2 (two) times daily. 09/15/17  Yes Troy Sine, MD    Physical Exam: Vitals:   11/09/17 0028 11/09/17 0030 11/09/17 0100 11/09/17 0131  BP:  111/77 119/73 (!) 122/96  Pulse: 70  71 74 68  Resp: (!) 26 16 15  (!) 21  Temp:      SpO2: 98% 97% 93% 96%  Weight:      Height:       General: Not in acute distress HEENT:       Eyes: PERRL, EOMI, no scleral icterus.       ENT: No discharge from the ears and nose, no pharynx injection, no tonsillar enlargement.        Neck: No JVD, no bruit, no mass felt. Heme: No neck lymph node enlargement. Cardiac: S1/S2, RRR, No murmurs, No gallops or rubs. Respiratory: No rales, wheezing, rhonchi or rubs. GI: Soft, nondistended, nontender, no rebound pain, no organomegaly, BS present. GU: No hematuria Ext: No pitting leg edema bilaterally. 2+DP/PT pulse bilaterally. Musculoskeletal: No joint deformities, No joint redness or warmth, no limitation of ROM in spin. Skin: No rashes.  Neuro: Alert, oriented X3, cranial nerves II-XII grossly intact, moves all extremities normally. Psych: Patient is not psychotic, no suicidal or hemocidal ideation.  Labs on Admission: I have personally reviewed following labs and imaging studies  CBC: Recent Labs  Lab 11/08/17 2320  WBC 9.9  HGB 14.5  HCT 43.6  MCV 90.6  PLT 017   Basic Metabolic Panel: Recent Labs  Lab 11/08/17 2320  NA 138  K 3.7  CL 103  CO2 25  GLUCOSE 109*  BUN 16  CREATININE 1.02  CALCIUM 9.0   GFR: Estimated Creatinine Clearance: 94 mL/min (by C-G formula based on SCr of 1.02 mg/dL). Liver Function Tests: No results for input(s): AST, ALT, ALKPHOS, BILITOT, PROT, ALBUMIN in the last 168 hours. No results for input(s): LIPASE, AMYLASE in the last 168 hours. No results for input(s): AMMONIA in the last 168 hours. Coagulation Profile: No results for input(s): INR, PROTIME in the last 168 hours. Cardiac Enzymes: No results for input(s): CKTOTAL, CKMB, CKMBINDEX, TROPONINI in the last 168 hours. BNP (last 3 results) No results for input(s): PROBNP in the last 8760 hours. HbA1C: No results for input(s): HGBA1C in the last 72 hours. CBG: No results for  input(s): GLUCAP in the last 168 hours. Lipid Profile: No results for input(s): CHOL, HDL, LDLCALC, TRIG, CHOLHDL, LDLDIRECT in the last 72 hours. Thyroid Function Tests: No results for input(s): TSH, T4TOTAL, FREET4, T3FREE, THYROIDAB in the last 72 hours. Anemia Panel: No results for input(s): VITAMINB12, FOLATE, FERRITIN, TIBC, IRON, RETICCTPCT in the last 72 hours. Urine analysis: No results found for: COLORURINE, APPEARANCEUR, LABSPEC, PHURINE, GLUCOSEU, HGBUR, BILIRUBINUR, KETONESUR, PROTEINUR, UROBILINOGEN, NITRITE, LEUKOCYTESUR Sepsis Labs: @LABRCNTIP (procalcitonin:4,lacticidven:4) )No results found for this or any previous visit (from the past 240 hour(s)).  Radiological Exams on Admission: Dg Chest 2 View  Result Date: 11/08/2017 CLINICAL DATA:  Chest pain EXAM: CHEST - 2 VIEW COMPARISON:  06/29/2017 FINDINGS: The heart size and mediastinal contours are within normal limits. Both lungs are clear. The visualized skeletal structures are unremarkable. IMPRESSION: No active cardiopulmonary disease. Electronically Signed   By: Donavan Foil M.D.   On: 11/08/2017 23:39     EKG: Independently reviewed.  Sinus rhythm, QTC 402, low voltage, poor R wave progression, T wave flattening.   Assessment/Plan Principal Problem:   Chest pain Active Problems:   History of testicular cancer   Chronic combined systolic and diastolic heart failure (HCC)   Essential hypertension   CAD S/P percutaneous coronary angioplasty   HLD (hyperlipidemia)   Chest pain and hx of CAD/STEMI: s/p of stent and restented in July. Patient does not have shortness of breath, no signs of DVT, low suspicion so for PE.  Given history of STEMI, stent placement and restented in July, patient is high risk of developing ACS again.  - will place on Tele bed for obs - cycle CE q6 x3 and repeat EKG in the am  - prn Nitroglycerin, Morphine, and aspirin, brilinta, lipitor, Coreg - Risk factor stratification: will check  FLP, UDS and A1C  - did not order 2d echo--> please reevaluate pt in AM to decide if pt needs 2D echo. - inpt card consult was requested via Epic  Left upper arm pain in bicep area: Etiology is not clear.  No arm swelling, does not have signs of DVT.  Possibly due to chest pain radiating to the left arm. -Observe closely.  History of testicular cancer: s/p of bilateral orchiectomy, radiation therapy.  In remission. -Continue testosterone gel  Chronic combined systolic and diastolic heart failure ([place]): 2D echo on 08/12/2017 showed EF 40% with grade 1 diastolic dysfunction.  Patient does not have leg edema, JVD, shortness of breath.  CHF is compensated. - Check BNP  Essential hypertension: -IV Hydralazine prn -Continue home medications: Coreg and Cozaar  HLD: -Lipitor   DVT ppx:  SQ Lovenox Code Status: Full code Family Communication: None at bed side.   Disposition Plan:  Anticipate discharge back to previous home environment Consults called:  none Admission status: Obs / tele     Date of Service 11/09/2017    Ivor Costa [facility] Pager [phone]  If 7PM-7AM, please contact night-coverage [email or web address] Password [email or web address] 11/09/2017, 2:49 AM

## 2017-11-09 NOTE — ED Provider Notes (Signed)
[facility] Provider Note   CSN: [id number] Arrival date & time: 11/08/17  2305     History   Chief Complaint Chief Complaint  Patient presents with  . Chest Pain    HPI Cesar Woods is a 52 y.o. male.  She presents to the emergency department for evaluation of chest pain and left arm pain.  He reports that symptoms began yesterday.  He has had a dull aching pain Woods the area of his left bicep.  He has not identified any alleviating or exacerbating factors of this pain.  He reports that he was ignoring it, felt it was likely just his muscle, until today started having some intermittent episodes of chest pain.  Chest pain is substernal and generally comes and goes quickly, described as sharp Woods nature.  He had some nausea earlier but that has resolved.  No diaphoresis.  No shortness of breath.  Patient reports a previous history of heart disease.  His current symptoms are not similar to what he had with his previous MI, but he has had difficult to diagnose coronary artery disease Woods the past.     Past Medical History:  Diagnosis Date  . ACS (acute coronary syndrome) ([place]) 11/2015  . Atypical chest pain    Negative Myoview 2010  . Coronary artery disease    a.s/p STEMI Woods 01/2015 requiring placement of 4 DES to LAD. b. 11/2015: NST showing apical scar with minimal peri-infarct ischemia.  . Essential hypertension   . Food allergy    Anaphylaxis with shell fish  . GERD (gastroesophageal reflux disease)   . History of migraines   . Hyperlipidemia   . Ischemic cardiomyopathy    a. Echo 05/2015: EF 40-45%, apical akineiss with Grade 1 DD.  [name] Myocardial infarction ([place]) 2016  . Testicle cancer ([place])    Woods remission - followed by Dr. Risa Grill; had bilateral orchiectomy and XRT Woods past    Patient Active Problem List   Diagnosis Date Noted  . Abnormal TSH 08/25/2017  . Progressive angina ([place]) 08/16/2017  . CAD S/P percutaneous coronary  angioplasty 08/10/2017  . Ischemic cardiomyopathy 12/10/2015  . Essential hypertension 12/10/2015  . Hyperlipidemia LDL goal <70 06/25/2015  . Acute MI, anterolateral wall, subsequent episode of care ([place]) 04/20/2015  . Chronic combined systolic and diastolic heart failure ([place]) 01/16/2015  . History of ST elevation myocardial infarction (STEMI) 01/14/2015  . Skin lesion 04/07/2012  . GOITER, MULTINODULAR 03/09/2010  . THYROID NODULE, RIGHT 03/06/2010  . DYSPHAGIA UNSPECIFIED 12/22/2009  . History of testicular cancer 10/17/2009  . SHORTNESS OF BREATH 10/17/2009  . ALLERGY, FOOD 03/07/2009  . SKIN TAG 03/07/2009  . PALPITATIONS 08/21/2008  . ALLERGIC RHINITIS 08/12/2008  . COLONIC POLYPS, HX OF 08/12/2008  . Dyslipidemia 07/29/2008  . Hypertensive heart disease 07/29/2008  . GERD 07/29/2008  . CHEST PAIN, ATYPICAL 07/29/2008    Past Surgical History:  Procedure Laterality Date  . CARDIAC CATHETERIZATION N/A 01/15/2015   Procedure: Left Heart Cath and Coronary Angiography;  Surgeon: [place] Sine, MD;  Location: [facility];  Service: Cardiovascular;  Laterality: N/A;  . CARDIAC CATHETERIZATION  01/15/2015   Procedure: Coronary Stent Intervention;  Surgeon: Troy Sine, MD;  Location: [facility];  Service: Cardiovascular;;  . CORONARY BALLOON ANGIOPLASTY N/A 08/17/2017   Procedure: CORONARY BALLOON ANGIOPLASTY;  Surgeon: Leonie Man, MD;  Location: [facility];  Service: Cardiovascular;  Laterality: N/A;  . LEFT  HEART CATH AND CORONARY ANGIOGRAPHY N/A 08/17/2017   Procedure: LEFT HEART CATH AND CORONARY ANGIOGRAPHY;  Surgeon: Leonie Man, MD;  Location: [facility];  Service: Cardiovascular;  Laterality: N/A;  . ORCHIECTOMY Bilateral         Home Medications    Prior to Admission medications   Medication Sig Start Date End Date Taking? Authorizing Provider  aspirin EC 81 MG EC tablet Take 1 tablet (81 mg total) by mouth daily. 01/18/15    Brett Canales, PA-C  atorvastatin (LIPITOR) 80 MG tablet TAKE 1 TABLET BY MOUTH DAILY AT 6 PM 03/07/17   Lendon Colonel, NP  carvedilol (COREG) 25 MG tablet Take 1 tablet (25 mg total) by mouth 2 (two) times daily with a meal. 03/07/17   Lendon Colonel, NP  EPIPEN 2-PAK 0.3 MG/0.3ML SOAJ injection INJECT 0.3 MLS INTO THE MUSCLE ONCE **PATIENT NEEDS TO SCHEDULE AN OFFICE VISIT** 06/12/13   Shawna Orleans, Doe-Hyun R, DO  losartan (COZAAR) 25 MG tablet Take 1 tablet (25 mg total) by mouth daily. 03/07/17   Lendon Colonel, NP  nitroGLYCERIN (NITROSTAT) 0.4 MG SL tablet Place 1 tablet (0.4 mg total) under the tongue every 5 (five) minutes x 3 doses as needed for chest pain. 03/07/17   Lendon Colonel, NP  ticagrelor (BRILINTA) 90 MG TABS tablet Take 1 tablet (90 mg total) by mouth 2 (two) times daily. 09/15/17   Troy Sine, MD    Family History Family History  Problem Relation Age of Onset  . Heart attack Father        CABG at agae 35  . Stroke Paternal Grandfather   . Hypertension Unknown   . Hyperlipidemia Unknown   . Heart attack Maternal Grandmother     Social History Social History   Tobacco Use  . Smoking status: Former Research scientist (life sciences)  . Smokeless tobacco: Never Used  . Tobacco comment: quit around 2010  Substance Use Topics  . Alcohol use: Yes    Alcohol/week: 0.0 standard drinks    Comment: occasionally  . Drug use: No    Comment: remote use of marijuana, quit a long time ago     Allergies   Shellfish allergy; Iodine; Penicillins; and Ace inhibitors   Review of Systems Review of Systems  Cardiovascular: Positive for chest pain.  All other systems reviewed and are negative.    Physical Exam Updated Vital Signs BP 119/73   Pulse 74   Temp 97.8 F (36.6 C)   Resp 15   Ht 6' (1.829 m)   Wt 93 kg   SpO2 93%   BMI 27.80 kg/m   Physical Exam  Constitutional: He is oriented to person, place, and time. He appears well-developed and well-nourished. No distress.    HENT:  Head: Normocephalic and atraumatic.  Right Ear: Hearing normal.  Left Ear: Hearing normal.  Nose: Nose normal.  Mouth/Throat: Oropharynx is clear and moist and mucous membranes are normal.  Eyes: Pupils are equal, round, and reactive to light. Conjunctivae and EOM are normal.  Neck: Normal range of motion. Neck supple.  Cardiovascular: Regular rhythm, S1 normal and S2 normal. Exam reveals no gallop and no friction rub.  No murmur heard. Pulmonary/Chest: Effort normal and breath sounds normal. No respiratory distress. He exhibits no tenderness.  Abdominal: Soft. Normal appearance and bowel sounds are normal. There is no hepatosplenomegaly. There is no tenderness. There is no rebound, no guarding, no tenderness at McBurney's point and negative Murphy's sign.  No hernia.  Musculoskeletal: Normal range of motion.  Neurological: He is alert and oriented to person, place, and time. He has normal strength. No cranial nerve deficit or sensory deficit. Coordination normal. GCS eye subscore is 4. GCS verbal subscore is 5. GCS motor subscore is 6.  Skin: Skin is warm, dry and intact. No rash noted. No cyanosis.  Psychiatric: He has a normal mood and affect. His speech is normal and behavior is normal. Thought content normal.  Nursing note and vitals reviewed.    ED Treatments / Results  Labs (all labs ordered are listed, but only abnormal results are displayed) Labs Reviewed  BASIC METABOLIC PANEL - Abnormal; Notable for the following components:      Result Value   Glucose, Bld 109 (*)    All other components within normal limits  CBC  I-STAT TROPONIN, ED    EKG None  Radiology Dg Chest 2 View  Result Date: 11/08/2017 CLINICAL DATA:  Chest pain EXAM: CHEST - 2 VIEW COMPARISON:  06/29/2017 FINDINGS: The heart size and mediastinal contours are within normal limits. Both lungs are clear. The visualized skeletal structures are unremarkable. IMPRESSION: No active cardiopulmonary  disease. Electronically Signed   By: Donavan Foil M.D.   On: 11/08/2017 23:39    Procedures Procedures (including critical care time)  Medications Ordered Woods ED Medications  aspirin chewable tablet 324 mg (has no administration Woods time range)     Initial Impression / Assessment and Plan / ED Course  I have reviewed the triage vital signs and the nursing notes.  Pertinent labs & imaging results that were available during my care of the patient were reviewed by me and considered Woods my medical decision making (see chart for details).     Patient presents to the emergency department for evaluation of chest pain and arm pain.  Patient has had a previous MI 4 years ago.  At that time he felt like he was is having indigestion and ended up being diagnosed with an ST elevation MI with delayed presentation.  He had stenting of his LAD.  Patient has had multiple episodes of what was felt to be atypical chest pain this year.  He even had a stress test that did not show any reversible ischemia.  Because he continued to have chest pain, he underwent cardiac catheterization Woods July and was felt to have recurrent disease Woods his LAD, underwent PTCA.  As such, patient has had atypical presentations and I cannot rule him out Woods the ER.  Will recommend hospitalization for serial enzymes and cardiology consultation.  Final Clinical Impressions(s) / ED Diagnoses   Final diagnoses:  Chest pain, unspecified type    ED Discharge Orders    None       Orpah Greek, MD 11/09/17 0130

## 2017-11-09 NOTE — Consult Note (Addendum)
Cardiology Consultation:   Patient ID: Cesar Woods MRN: [id number]; DOB: 10-03-1965  Admit date: 11/08/2017 Date of Consult: 11/09/2017  Primary Care Provider: Delilah Shan, MD Primary Cardiologist: [name], MD  Primary Electrophysiologist:  None    Patient Profile:   Cesar Woods is a 52 y.o. male with a hx of hypertensive heart disease, hx of anterolateral wall STEMI with delayed presentation 01/2015 with stents x 3 to LAD, chronic systolic heart failure, HTN, and HLD who is being seen today for the evaluation of chest pain at the request of Dr. Nevada Crane.  History of Present Illness:   Cesar Woods has a complex cardiac history that includes out of hospital anterior MI 01/2015 with three stents placed to LAD and subsequent reduction in LVEF of 40-45% post-MI. He was seen in clinic for recurrent chest pain in 08/2017 and scheduled for a repeat heart cath. This was done 08/17/17 by Dr Ellyn Hack and revealed mid LAD ISR of 85%. He also has residual occlusion of a small Dx2 with L-L collaterals, 30% RCA and 55% mid OM1.  His EF at cath was 35-45%, echo 08/12/17 showed his EF to be 40%. He was treated with PTCA with good final result. He tolerated the procedure well and was seen in clinic for follow up. He continues on DAPT with ASA and brilinta. He is complaint on medications.  He presented back to [facility] with chest pain. On my interview, he describes 2/10 left arm pain on Sunday night that was constant in nature. The pain lasted until he went to sleep and did not recur the next morning. On Monday evening, he experienced intermittent sharp chest pain. This occurred several times, lasting only minutes at a time. The pain did not keep him from sleeping. In fact, he went to sleep approximately 3 hrs earlier that evening than normal. On Tues night, he experienced the pain again and reported to the [facility]. He reports having sharp chest pain about every hour, lasting only minutes, without radiation and  no associated symptoms. He was nauseous in the ER secondary to a blood draw. His weight has been stable and he appears euvolemic on exam. This is the same chest pain he experienced prior to his PCI in July of this year.  He was admitted for observation. Troponins were cycled and have been negative. EKG on arrival with TWI V4/5, repeat EKG with sinus rhythm. He has not received nitro. He did receive 324 mg ASA. Brilinta has been continued.   Past Medical History:  Diagnosis Date  . ACS (acute coronary syndrome) ([place]) 11/2015  . Atypical chest pain    Negative Myoview 2010  . Coronary artery disease    a.s/p STEMI in 01/2015 requiring placement of 4 DES to LAD. b. 11/2015: NST showing apical scar with minimal peri-infarct ischemia.  . Essential hypertension   . Food allergy    Anaphylaxis with shell fish  . GERD (gastroesophageal reflux disease)   . History of migraines   . Hyperlipidemia   . Ischemic cardiomyopathy    a. Echo 05/2015: EF 40-45%, apical akineiss with Grade 1 DD.  [name] Myocardial infarction ([place]) 2016  . Testicle cancer ([place])    In remission - followed by Dr. Risa Grill; had bilateral orchiectomy and XRT in past    Past Surgical History:  Procedure Laterality Date  . CARDIAC CATHETERIZATION N/A 01/15/2015   Procedure: Left Heart Cath and Coronary Angiography;  Surgeon: Troy Sine, MD;  Location: [place] [facility];  Service: Cardiovascular;  Laterality: N/A;  . CARDIAC CATHETERIZATION  01/15/2015   Procedure: Coronary Stent Intervention;  Surgeon: Troy Sine, MD;  Location: [place] [facility];  Service: Cardiovascular;;  . CORONARY BALLOON ANGIOPLASTY N/A 08/17/2017   Procedure: CORONARY BALLOON ANGIOPLASTY;  Surgeon: Leonie Man, MD;  Location: [place] [facility];  Service: Cardiovascular;  Laterality: N/A;  . LEFT HEART CATH AND CORONARY ANGIOGRAPHY N/A 08/17/2017   Procedure: LEFT HEART CATH AND CORONARY ANGIOGRAPHY;  Surgeon: Leonie Man, MD;  Location: [place] [facility];  Service: Cardiovascular;  Laterality: N/A;  . ORCHIECTOMY Bilateral      Home Medications:  Prior to Admission medications   Medication Sig Start Date End Date Taking? Authorizing Provider  aspirin EC 81 MG EC tablet Take 1 tablet (81 mg total) by mouth daily. 01/18/15  Yes Brett Canales, PA-C  atorvastatin (LIPITOR) 80 MG tablet TAKE 1 TABLET BY MOUTH DAILY AT 6 PM Patient taking differently: Take 80 mg by mouth daily at 6 PM.  03/07/17  Yes Lendon Colonel, NP  carvedilol (COREG) 25 MG tablet Take 1 tablet (25 mg total) by mouth 2 (two) times daily with a meal. 03/07/17  Yes Lendon Colonel, NP  EPIPEN 2-PAK 0.3 MG/0.3ML SOAJ injection INJECT 0.3 MLS INTO THE MUSCLE ONCE **PATIENT NEEDS TO SCHEDULE AN OFFICE VISIT** Patient taking differently: Inject 0.3 mg into the muscle daily as needed (allergic reaction).  06/12/13  Yes Yoo, Doe-Hyun R, DO  losartan (COZAAR) 25 MG tablet Take 1 tablet (25 mg total) by mouth daily. 03/07/17  Yes Lendon Colonel, NP  nitroGLYCERIN (NITROSTAT) 0.4 MG SL tablet Place 1 tablet (0.4 mg total) under the tongue every 5 (five) minutes x 3 doses as needed for chest pain. 03/07/17  Yes Lendon Colonel, NP  testosterone (ANDROGEL) 50 MG/5GM (1%) GEL Place 5 g onto the skin daily.   Yes [provider]  ticagrelor (BRILINTA) 90 MG TABS tablet Take 1 tablet (90 mg total) by mouth 2 (two) times daily. 09/15/17  Yes Troy Sine, MD    Inpatient Medications: Scheduled Meds: . aspirin  81 mg Oral Daily  . atorvastatin  80 mg Oral q1800  . carvedilol  25 mg Oral BID WC  . enoxaparin (LOVENOX) injection  40 mg Subcutaneous Q24H  . [START ON 11/10/2017] Influenza vac split quadrivalent PF  0.5 mL Intramuscular Tomorrow-1000  . losartan  25 mg Oral Daily  . testosterone  5 g Transdermal Daily  . ticagrelor  90 mg Oral BID   Continuous Infusions:  PRN Meds: acetaminophen, EPINEPHrine, hydrALAZINE, morphine injection,  nitroGLYCERIN, ondansetron (ZOFRAN) IV, zolpidem  Allergies:    Allergies  Allergen Reactions  . Iodine Anaphylaxis    Shellfish allergy   . Shellfish Allergy Anaphylaxis  . Penicillins Other (See Comments)    Childhood allergy Has patient had a PCN reaction causing immediate rash, facial/tongue/throat swelling, SOB or lightheadedness with hypotension: Unknown Has patient had a PCN reaction causing severe rash involving mucus membranes or skin necrosis: Unknown Has patient had a PCN reaction that required hospitalization: No Has patient had a PCN reaction occurring within the last 10 years: No If all of the above answers are "NO", then may proceed with Cephalosporin use.    . Ace Inhibitors Cough    Social History:   Social History   Socioeconomic History  . Marital status: Married    Spouse name: Not on file  . Number of  children: Not on file  . Years of education: Not on file  . Highest education level: Not on file  Occupational History  . Not on file  Social Needs  . Financial resource strain: Not on file  . Food insecurity:    Worry: Not on file    Inability: Not on file  . Transportation needs:    Medical: Not on file    Non-medical: Not on file  Tobacco Use  . Smoking status: Former Research scientist (life sciences)  . Smokeless tobacco: Never Used  . Tobacco comment: quit around 2010  Substance and Sexual Activity  . Alcohol use: Yes    Alcohol/week: 0.0 standard drinks    Comment: occasionally  . Drug use: No    Comment: remote use of marijuana, quit a long time ago  . Sexual activity: Not on file  Lifestyle  . Physical activity:    Days per week: Not on file    Minutes per session: Not on file  . Stress: Not on file  Relationships  . Social connections:    Talks on phone: Not on file    Gets together: Not on file    Attends religious service: Not on file    Active member of club or organization: Not on file    Attends meetings of clubs or organizations: Not on file     Relationship status: Not on file  . Intimate partner violence:    Fear of current or ex partner: Not on file    Emotionally abused: Not on file    Physically abused: Not on file    Forced sexual activity: Not on file  Other Topics Concern  . Not on file  Social History Narrative   Occupation:Senior Trader for [employer]   Divorced    No children      Former smoker    Alcohol Use - yes          Family History:    Family History  Problem Relation Age of Onset  . Heart attack Father        CABG at agae 51  . Stroke Paternal Grandfather   . Hypertension Unknown   . Hyperlipidemia Unknown   . Heart attack Maternal Grandmother      ROS:  Please see the history of present illness.   All other ROS reviewed and negative.     Physical Exam/Data:   Vitals:   11/09/17 0131 11/09/17 0200 11/09/17 0458 11/09/17 0810  BP: (!) 122/96 130/87  110/89  Pulse: 68 70  74  Resp: (!) 21 18  (!) 23  Temp:  98 F (36.7 C)  98 F (36.7 C)  TempSrc:    Oral  SpO2: 96% 94%  95%  Weight:   92.9 kg   Height:   6' (1.829 m)     Intake/Output Summary (Last 24 hours) at 11/09/2017 1026 Last data filed at 11/09/2017 0900 Gross per 24 hour  Intake 350.4 ml  Output -  Net 350.4 ml   Filed Weights   11/08/17 2322 11/09/17 0458  Weight: 93 kg 92.9 kg   Body mass index is 27.78 kg/m.  General:  Well nourished, well developed, in no acute distress HEENT: normal Neck: no JVD Vascular: No carotid bruits Cardiac:  normal S1, S2; RRR; no murmur  Lungs:  clear to auscultation bilaterally, no wheezing, rhonchi or rales  Abd: soft, nontender, no hepatomegaly  Ext: no edema Musculoskeletal:  No deformities, BUE and BLE strength normal and equal  Skin: warm and dry  Neuro:  CNs 2-12 intact, no focal abnormalities noted Psych:  Normal affect   EKG:  The EKG was personally reviewed and demonstrates:  sinus Telemetry:  Telemetry was personally reviewed and demonstrates:  sinus  Relevant CV  Studies:  Left heart cath 08/17/17:  Previously placed Prox-mid LAD drug eluting stents are mostly Patent with the exception of a focal 85% in-stent restenosis lesion at ~Diag2.  Scoring balloon angioplasty was performed. 2.5 X10 WOLVERINE  Post dilation balloon angioplasty was performed using a BALLOON SAPPHIRE [place] 3.0X10.  Post intervention, there is a 5% residual stenosis.  Ost 2nd Diag lesion is 100% stenosed-originally jailed at the time of anterior STEMI. Now fills via retrograde collateral flow from the distal LAD.  Ost 1st Mrg-1 lesion is 40% stenosed. Ost 1st Mrg-2 lesion is 55% stenosed.  There is moderate to severe left ventricular systolic dysfunction -distal anterior hypokinesis with apical aneurysmal dilation and inferoapical hypo-to akinesis.  The left ventricular ejection fraction is 35-45% by visual estimate.  LV end diastolic pressure is mildly elevated.   Severe focal/isolated in-stent restenosis in the midportion of the extensive proximal to mid LAD stents.  This is roughly at the takeoff of a diagonal branch that was occluded.  The region was treated successfully with scoring balloon angioplasty followed by noncompliant balloon post dilation of the stent.  Moderate to severe cardiomyopathy with anteroapical hypo-to akinesis with aneurysmal dilation.  Plan: Uncomplicated PTCA intervention of in-stent restenosis, the patient stable first same-day discharge.  He is already on aspirin plus Brilinta which will be continued.  He is on stable home medications which he will continue in the outpatient setting.   Echo 08/12/17: Study Conclusions - Left ventricle: Diffuse hypokinesis with septal and apical   akinesis no mural apical thrombus. The cavity size was mildly   dilated. Wall thickness was increased in a pattern of mild LVH.   There was mild concentric hypertrophy. The estimated ejection   fraction was 40%. Doppler parameters are consistent with abnormal    left ventricular relaxation (grade 1 diastolic dysfunction). - Atrial septum: No defect or patent foramen ovale was identified.   Laboratory Data:  Chemistry Recent Labs  Lab 11/08/17 2320  NA 138  K 3.7  CL 103  CO2 25  GLUCOSE 109*  BUN 16  CREATININE 1.02  CALCIUM 9.0  GFRNONAA >60  GFRAA >60  ANIONGAP 10    No results for input(s): PROT, ALBUMIN, AST, ALT, ALKPHOS, BILITOT in the last 168 hours. Hematology Recent Labs  Lab 11/08/17 2320  WBC 9.9  RBC 4.81  HGB 14.5  HCT 43.6  MCV 90.6  MCH 30.1  MCHC 33.3  RDW 12.7  PLT 194   Cardiac Enzymes Recent Labs  Lab 11/09/17 0356 11/09/17 0824  TROPONINI <0.03 <0.03    Recent Labs  Lab 11/08/17 2341  TROPIPOC 0.00    BNP Recent Labs  Lab 11/09/17 0356  BNP 14.6    DDimer No results for input(s): DDIMER in the last 168 hours.  Radiology/Studies:  Dg Chest 2 View  Result Date: 11/08/2017 CLINICAL DATA:  Chest pain EXAM: CHEST - 2 VIEW COMPARISON:  06/29/2017 FINDINGS: The heart size and mediastinal contours are within normal limits. Both lungs are clear. The visualized skeletal structures are unremarkable. IMPRESSION: No active cardiopulmonary disease. Electronically Signed   By: Donavan Foil M.D.   On: 11/08/2017 23:39    Assessment and Plan:   1. Chest pain - troponin x  3 negative - EKG with TWI V4/5, now resolved - he continues to have intermittent sharp chest pain nearly every hour, although this pain did not wake him from sleep overnight - this seems to be his anginal equivalent - given his history of in-stent restenosis, recommend repeat angiography  - he has been compliant on home medications, including DAPT   2. Chronic systolic and diastolic heart failure, ischemic cardiomyopathy - echo with EF of 40% and grade 1 DD - weight is stable, does not require diuretics at home - euvolemic on exam, laying flat in bed   3. HTN - pressures are controlled on present regimen - coreg,  losartan   4. HLD 11/09/2017: Cholesterol 94; HDL 27; LDL Cholesterol 40; Triglycerides 136; VLDL 27 - continue lipitor      For questions or updates, please contact [place] Please consult [email or web address] for contact info under     Signed, Ledora Bottcher, PA  11/09/2017 10:26 AM   I have examined the patient and reviewed assessment and plan and discussed with patient.  Agree with above as stated.  Patient with known CAD, and now unstable angina sx.  THese sx are similar to what he had in July 2019.  Plan for repeat cath today.  He is agreeable.  All questions answered.  PTCA in 7/19.  Could have developed restenosis since no stent was placed at that time.   COntinue aggressive secondary prevention.   Larae Grooms

## 2017-11-09 NOTE — Interval H&P Note (Signed)
Cath Lab Visit (complete for each Cath Lab visit)  Clinical Evaluation Leading to the Procedure:   ACS: No.  Non-ACS:    Anginal Classification: CCS III  Anti-ischemic medical therapy: Minimal Therapy (1 class of medications)  Non-Invasive Test Results: No non-invasive testing performed  Prior CABG: No previous CABG      History and Physical Interval Note:  11/09/2017 2:45 PM  Cesar Woods  has presented today for surgery, with the diagnosis of cp  The various methods of treatment have been discussed with the patient and family. After consideration of risks, benefits and other options for treatment, the patient has consented to  Procedure(s): LEFT HEART CATH AND CORONARY ANGIOGRAPHY (N/A) as a surgical intervention .  The patient's history has been reviewed, patient examined, no change in status, stable for surgery.  I have reviewed the patient's chart and labs.  Questions were answered to the patient's satisfaction.     [name]

## 2017-11-09 NOTE — Progress Notes (Signed)
Cesar Woods is a 52 y.o. male with medical history significant of hypertension, hyperlipidemia, GERD, CAD, STEMI, stent placement (restented July 2019), migraine headaches, CHF with EF 40%, testicular cancer in remission (orchidectomy, radiation therapy), who presents with chest pain.  11/09/17: Patient seen and examined with his wife at bedside.  Chest pain is persistent and similar to previous that led to an STEMI.  Left heart cath planned today.  Please refer to H&P dictated by Dr. Blaine Hamper on 11/09/2017 for further details of the assessment and plan.

## 2017-11-10 ENCOUNTER — Observation Stay (HOSPITAL_BASED_OUTPATIENT_CLINIC_OR_DEPARTMENT_OTHER): Payer: BLUE CROSS/BLUE SHIELD

## 2017-11-10 ENCOUNTER — Encounter (HOSPITAL_COMMUNITY): Payer: Self-pay | Admitting: Cardiovascular Disease

## 2017-11-10 ENCOUNTER — Other Ambulatory Visit (HOSPITAL_COMMUNITY): Payer: BLUE CROSS/BLUE SHIELD

## 2017-11-10 DIAGNOSIS — I2510 Atherosclerotic heart disease of native coronary artery without angina pectoris: Secondary | ICD-10-CM | POA: Diagnosis not present

## 2017-11-10 DIAGNOSIS — R072 Precordial pain: Secondary | ICD-10-CM | POA: Diagnosis not present

## 2017-11-10 DIAGNOSIS — I429 Cardiomyopathy, unspecified: Secondary | ICD-10-CM

## 2017-11-10 DIAGNOSIS — I25110 Atherosclerotic heart disease of native coronary artery with unstable angina pectoris: Secondary | ICD-10-CM | POA: Diagnosis not present

## 2017-11-10 DIAGNOSIS — E782 Mixed hyperlipidemia: Secondary | ICD-10-CM | POA: Diagnosis not present

## 2017-11-10 DIAGNOSIS — Z9861 Coronary angioplasty status: Secondary | ICD-10-CM | POA: Diagnosis not present

## 2017-11-10 LAB — CBC
HCT: 40.9 % (ref 39.0–52.0)
Hemoglobin: 13.6 g/dL (ref 13.0–17.0)
MCH: 30 pg (ref 26.0–34.0)
MCHC: 33.3 g/dL (ref 30.0–36.0)
MCV: 90.1 fL (ref 78.0–100.0)
Platelets: 171 10*3/uL (ref 150–400)
RBC: 4.54 MIL/uL (ref 4.22–5.81)
RDW: 12.6 % (ref 11.5–15.5)
WBC: 8.9 10*3/uL (ref 4.0–10.5)

## 2017-11-10 LAB — BASIC METABOLIC PANEL
Anion gap: 7 (ref 5–15)
BUN: 14 mg/dL (ref 6–20)
CALCIUM: 8.2 mg/dL — AB (ref 8.9–10.3)
CO2: 25 mmol/L (ref 22–32)
CREATININE: 0.89 mg/dL (ref 0.61–1.24)
Chloride: 106 mmol/L (ref 98–111)
GFR calc Af Amer: >60 mL/min (ref >60)
GFR calc non Af Amer: >60 mL/min (ref >60)
GLUCOSE: 118 mg/dL — AB (ref 70–99)
Potassium: 3.5 mmol/L (ref 3.5–5.1)
Sodium: 138 mmol/L (ref 135–145)

## 2017-11-10 LAB — ECHOCARDIOGRAM COMPLETE
Height: 72 in
Weight: 3312 oz

## 2017-11-10 LAB — HEMOGLOBIN A1C
Hgb A1c MFr Bld: 5.9 % — ABNORMAL HIGH (ref 4.8–5.6)
MEAN PLASMA GLUCOSE: 123 mg/dL

## 2017-11-10 MED ORDER — APIXABAN 5 MG PO TABS: 5.0000 mg | ORAL_TABLET | Freq: Two times a day (BID) | ORAL | 1 refills | Status: DC

## 2017-11-10 MED ORDER — CLOPIDOGREL BISULFATE 75 MG PO TABS: 75.0000 mg | ORAL_TABLET | Freq: Every day | ORAL | Status: DC

## 2017-11-10 MED ORDER — PERFLUTREN LIPID MICROSPHERE
1.0000 mL | INTRAVENOUS | Status: AC | PRN
  Administered 2017-11-10: 2 mL via INTRAVENOUS
  Filled 2017-11-10: qty 10

## 2017-11-10 MED ORDER — APIXABAN 5 MG PO TABS: 5.0000 mg | ORAL_TABLET | Freq: Two times a day (BID) | ORAL | Status: DC

## 2017-11-10 MED ORDER — PANTOPRAZOLE SODIUM 40 MG PO TBEC: 40.0000 mg | DELAYED_RELEASE_TABLET | Freq: Every day | ORAL | Status: DC

## 2017-11-10 MED ORDER — APIXABAN 5 MG PO TABS
5.0000 mg | ORAL_TABLET | Freq: Once | ORAL | Status: AC
  Administered 2017-11-10: 5 mg via ORAL
  Filled 2017-11-10: qty 1

## 2017-11-10 MED ORDER — ALUM & MAG HYDROXIDE-SIMETH 200-200-20 MG/5ML PO SUSP
30.0000 mL | Freq: Four times a day (QID) | ORAL | Status: DC | PRN
  Administered 2017-11-10: 30 mL via ORAL
  Filled 2017-11-10: qty 30

## 2017-11-10 MED ORDER — CLOPIDOGREL BISULFATE 300 MG PO TABS
300.0000 mg | ORAL_TABLET | Freq: Once | ORAL | Status: AC
  Administered 2017-11-10: 300 mg via ORAL
  Filled 2017-11-10: qty 1

## 2017-11-10 MED ORDER — PANTOPRAZOLE SODIUM 40 MG PO TBEC
40.0000 mg | DELAYED_RELEASE_TABLET | Freq: Every day | ORAL | Status: DC
  Administered 2017-11-10: 40 mg via ORAL
  Filled 2017-11-10: qty 1

## 2017-11-10 MED ORDER — PANTOPRAZOLE SODIUM 40 MG PO TBEC: 40.0000 mg | DELAYED_RELEASE_TABLET | Freq: Every day | ORAL | 0 refills | Status: DC

## 2017-11-10 MED ORDER — CLOPIDOGREL BISULFATE 75 MG PO TABS: 75.0000 mg | ORAL_TABLET | Freq: Every day | ORAL | 1 refills | Status: DC

## 2017-11-10 NOTE — Discharge Instructions (Signed)
Clopidogrel tablets What is this medicine? CLOPIDOGREL (kloh PID oh grel) helps to prevent blood clots. This medicine is used to prevent heart attack, stroke, or other vascular events in people who are at high risk. This medicine may be used for other purposes; ask your health care provider or pharmacist if you have questions. COMMON BRAND NAME(S): Plavix What should I tell my health care provider before I take this medicine? They need to know if you have any of the following conditions: -bleeding disorders -bleeding in the brain -having surgery -history of stomach bleeding -an unusual or allergic reaction to clopidogrel, other medicines, foods, dyes, or preservatives -pregnant or trying to get pregnant -breast-feeding How should I use this medicine? Take this medicine by mouth with a glass of water. Follow the directions on the prescription label. You may take this medicine with or without food. If it upsets your stomach, take it with food. Take your medicine at regular intervals. Do not take it more often than directed. Do not stop taking except on your doctor's advice. A special MedGuide will be given to you by the pharmacist with each prescription and refill. Be sure to read this information carefully each time. Talk to your pediatrician regarding the use of this medicine in children. Special care may be needed. Overdosage: If you think you have taken too much of this medicine contact a poison control center or emergency room at once. NOTE: This medicine is only for you. Do not share this medicine with others. What if I miss a dose? If you miss a dose, take it as soon as you can. If it is almost time for your next dose, take only that dose. Do not take double or extra doses. What may interact with this medicine? Do not take this medicine with the following medications: -dasabuvir; ombitasvir; paritaprevir; ritonavir -defibrotide This medicine may also interact with the following  medications: -antiviral medicines for HIV or AIDS -aspirin -certain medicines for depression like citalopram, fluoxetine, fluvoxamine -certain medicines for fungal infections like ketoconazole, fluconazole, voriconazole -certain medicines for seizures like felbamate, oxcarbazepine, phenytoin -certain medicines for stomach problems like cimetidine, omeprazole, esomeprazole -certain medicines that treat or prevent blood clots like warfarin, enoxaparin, dalteparin, apixaban, dabigatran, rivaroxaban, ticlopidine -chloramphenicol -cilostazol -fluvastatin -isoniazid -modafinil -nicardipine -NSAIDS, medicines for pain and inflammation, like ibuprofen or naproxen -quinine -repaglinide -tamoxifen -tolbutamide -topiramate -torsemide This list may not describe all possible interactions. Give your health care provider a list of all the medicines, herbs, non-prescription drugs, or dietary supplements you use. Also tell them if you smoke, drink alcohol, or use illegal drugs. Some items may interact with your medicine. What should I watch for while using this medicine? Visit your doctor or health care professional for regular check ups. Do not stop taking your medicine unless your doctor tells you to. Notify your doctor or health care professional and seek emergency treatment if you develop breathing problems; changes in vision; chest pain; severe, sudden headache; pain, swelling, warmth in the leg; trouble speaking; sudden numbness or weakness of the face, arm or leg. These can be signs that your condition has gotten worse. If you are going to have surgery or dental work, tell your doctor or health care professional that you are taking this medicine. Certain genetic factors may reduce the effect of this medicine. Your doctor may use genetic tests to determine treatment. What side effects may I notice from receiving this medicine? Side effects that you should report to your doctor or health care  professional as soon as possible: -allergic reactions like skin rash, itching or hives, swelling of the face, lips, or tongue -signs and symptoms of bleeding such as bloody or black, tarry stools; red or dark-brown urine; spitting up blood or brown material that looks like coffee grounds; red spots on the skin; unusual bruising or bleeding from the eye, gums, or nose -signs and symptoms of a blood clot such as breathing problems; changes in vision; chest pain; severe, sudden headache; pain, swelling, warmth in the leg; trouble speaking; sudden numbness or weakness of the face, arm or leg Side effects that usually do not require medical attention (report to your doctor or health care professional if they continue or are bothersome): -constipation -diarrhea -headache -upset stomach This list may not describe all possible side effects. Call your doctor for medical advice about side effects. You may report side effects to FDA at 1-800-FDA-1088. Where should I keep my medicine? Keep out of the reach of children. Store at room temperature of 59 to 86 degrees F (15 to 30 degrees C). Throw away any unused medicine after the expiration date. NOTE: This sheet is a summary. It may not cover all possible information. If you have questions about this medicine, talk to your doctor, pharmacist, or health care provider.  2018 Elsevier/Gold Standard (2014-10-31 10:00:44)   Apixaban oral tablets What is this medicine? APIXABAN (a PIX a ban) is an anticoagulant (blood thinner). It is used to lower the chance of stroke in people with a medical condition called atrial fibrillation. It is also used to treat or prevent blood clots in the lungs or in the veins. This medicine may be used for other purposes; ask your health care provider or pharmacist if you have questions. COMMON BRAND NAME(S): Eliquis What should I tell my health care provider before I take this medicine? They need to know if you have any of these  conditions: -bleeding disorders -bleeding in the brain -blood in your stools (black or tarry stools) or if you have blood in your vomit -history of stomach bleeding -kidney disease -liver disease -mechanical heart valve -an unusual or allergic reaction to apixaban, other medicines, foods, dyes, or preservatives -pregnant or trying to get pregnant -breast-feeding How should I use this medicine? Take this medicine by mouth with a glass of water. Follow the directions on the prescription label. You can take it with or without food. If it upsets your stomach, take it with food. Take your medicine at regular intervals. Do not take it more often than directed. Do not stop taking except on your doctor's advice. Stopping this medicine may increase your risk of a blot clot. Be sure to refill your prescription before you run out of medicine. Talk to your pediatrician regarding the use of this medicine in children. Special care may be needed. Overdosage: If you think you have taken too much of this medicine contact a poison control center or emergency room at once. NOTE: This medicine is only for you. Do not share this medicine with others. What if I miss a dose? If you miss a dose, take it as soon as you can. If it is almost time for your next dose, take only that dose. Do not take double or extra doses. What may interact with this medicine? This medicine may interact with the following: -aspirin and aspirin-like medicines -certain medicines for fungal infections like ketoconazole and itraconazole -certain medicines for seizures like carbamazepine and phenytoin -certain medicines that treat or prevent blood clots  like warfarin, enoxaparin, and dalteparin -clarithromycin -NSAIDs, medicines for pain and inflammation, like ibuprofen or naproxen -rifampin -ritonavir -St. John's wort This list may not describe all possible interactions. Give your health care provider a list of all the medicines, herbs,  non-prescription drugs, or dietary supplements you use. Also tell them if you smoke, drink alcohol, or use illegal drugs. Some items may interact with your medicine. What should I watch for while using this medicine? Visit your doctor or health care professional for regular checks on your progress. Notify your doctor or health care professional and seek emergency treatment if you develop breathing problems; changes in vision; chest pain; severe, sudden headache; pain, swelling, warmth in the leg; trouble speaking; sudden numbness or weakness of the face, arm or leg. These can be signs that your condition has gotten worse. If you are going to have surgery or other procedure, tell your doctor that you are taking this medicine. What side effects may I notice from receiving this medicine? Side effects that you should report to your doctor or health care professional as soon as possible: -allergic reactions like skin rash, itching or hives, swelling of the face, lips, or tongue -signs and symptoms of bleeding such as bloody or black, tarry stools; red or dark-brown urine; spitting up blood or brown material that looks like coffee grounds; red spots on the skin; unusual bruising or bleeding from the eye, gums, or nose This list may not describe all possible side effects. Call your doctor for medical advice about side effects. You may report side effects to FDA at 1-800-FDA-1088. Where should I keep my medicine? Keep out of the reach of children. Store at room temperature between 20 and 25 degrees C (68 and 77 degrees F). Throw away any unused medicine after the expiration date. NOTE: This sheet is a summary. It may not cover all possible information. If you have questions about this medicine, talk to your doctor, pharmacist, or health care provider.  2018 Elsevier/Gold Standard (2015-08-18 11:54:23) ----  Information on my medicine - ELIQUIS (apixaban)  This medication education was reviewed with me  or my healthcare representative as part of my discharge preparation.  The pharmacist that spoke with me during my hospital stay was:  Dareen Piano, [facility]  Why was Eliquis prescribed for you? Eliquis was prescribed for you to reduce the risk of a blood clot forming that can cause a stroke if you have a medical condition called atrial fibrillation (a type of irregular heartbeat).  What do You need to know about Eliquis ? Take your Eliquis TWICE DAILY - one tablet in the morning and one tablet in the evening with or without food. If you have difficulty swallowing the tablet whole please discuss with your pharmacist how to take the medication safely.  Take Eliquis exactly as prescribed by your doctor and DO NOT stop taking Eliquis without talking to the doctor who prescribed the medication.  Stopping may increase your risk of developing a stroke.  Refill your prescription before you run out.  After discharge, you should have regular check-up appointments with your healthcare provider that is prescribing your Eliquis.  In the future your dose may need to be changed if your kidney function or weight changes by a significant amount or as you get older.  What do you do if you miss a dose? If you miss a dose, take it as soon as you remember on the same day and resume taking twice daily.  Do not  take more than one dose of ELIQUIS at the same time to make up a missed dose.  Important Safety Information A possible side effect of Eliquis is bleeding. You should call your healthcare provider right away if you experience any of the following: ? Bleeding from an injury or your nose that does not stop. ? Unusual colored urine (red or dark brown) or unusual colored stools (red or black). ? Unusual bruising for unknown reasons. ? A serious fall or if you hit your head (even if there is no bleeding).  Some medicines may interact with Eliquis and might increase your risk of bleeding or clotting while  on Eliquis. To help avoid this, consult your healthcare provider or pharmacist prior to using any new prescription or non-prescription medications, including herbals, vitamins, non-steroidal anti-inflammatory drugs (NSAIDs) and supplements.  This website has more information on Eliquis (apixaban): http://[email or web address].com/eliquis/home

## 2017-11-10 NOTE — Care Management (Signed)
Pt will discharge home today in care of wife.  Pt has PCP and denied barriers with paying for medications.  Pt given free 30 day Eliquis card and reduced copay card - pt confirmed he does not have medicaid nor medicare.  Pt declined to wait for benefit check to be completed prior to leaving [facility] - pt informed to request pharmacy [facility] to tell him copay and if prior auth was required to follow up with cardiologist Dr Claiborne Billings.

## 2017-11-10 NOTE — Progress Notes (Signed)
Progress Note  Patient Name: Cesar Woods Date of Encounter: 11/10/2017  Primary Cardiologist: [name], MD *  Subjective   Still has some lower left chest pain.  WOrse with inspiration and palpation.   Inpatient Medications    Scheduled Meds: . aspirin  81 mg Oral Daily  . atorvastatin  80 mg Oral q1800  . carvedilol  25 mg Oral BID WC  . enoxaparin (LOVENOX) injection  40 mg Subcutaneous Q24H  . Influenza vac split quadrivalent PF  0.5 mL Intramuscular Tomorrow-1000  . losartan  25 mg Oral Daily  . pantoprazole  40 mg Oral Q1200  . sodium chloride flush  3 mL Intravenous Q12H  . testosterone  5 g Transdermal Daily  . ticagrelor  90 mg Oral BID   Continuous Infusions: . sodium chloride 125 mL/hr at 11/09/17 1900  . sodium chloride     PRN Meds: sodium chloride, acetaminophen, alum & mag hydroxide-simeth, diazepam, EPINEPHrine, hydrALAZINE, morphine injection, nitroGLYCERIN, ondansetron (ZOFRAN) IV, sodium chloride flush, zolpidem   Vital Signs    Vitals:   11/09/17 1922 11/10/17 0003 11/10/17 0500 11/10/17 0725  BP: 117/81 131/87 131/81 (!) 123/97  Pulse: 82 86 72 78  Resp: 19 19 16 16   Temp: 98.1 F (36.7 C) 98.6 F (37 C) 98.1 F (36.7 C) 97.8 F (36.6 C)  TempSrc: Oral Oral Oral Oral  SpO2: 95% 95% 95% 96%  Weight:      Height:        Intake/Output Summary (Last 24 hours) at 11/10/2017 0952 Last data filed at 11/10/2017 0003 Gross per 24 hour  Intake 1343.75 ml  Output 375 ml  Net 968.75 ml   Filed Weights   11/08/17 2322 11/09/17 0458 11/09/17 1322  Weight: 93 kg 92.9 kg 93.9 kg    Telemetry    NSR- Personally Reviewed  ECG      Physical Exam   GEN: No acute distress.   Neck: No JVD Cardiac: RRR, no murmurs, rubs, or gallops.  Respiratory: Clear to auscultation bilaterally. GI: Soft, nontender, non-distended  MS: No edema; No deformity. No radial hematoma Neuro:  Nonfocal  Psych: Normal affect   Labs    Chemistry Recent  Labs  Lab 11/08/17 2320 11/09/17 1640 11/10/17 0301  NA 138 140 138  K 3.7 3.6 3.5  CL 103 107 106  CO2 25 24 25   GLUCOSE 109* 87 118*  BUN 16 14 14   CREATININE 1.02 0.88 0.89  CALCIUM 9.0 8.6* 8.2*  GFRNONAA >60 >60 >60  GFRAA >60 >60 >60  ANIONGAP 10 9 7      Hematology Recent Labs  Lab 11/08/17 2320 11/09/17 1640 11/10/17 0301  WBC 9.9 8.8 8.9  RBC 4.81 5.01 4.54  HGB 14.5 14.7 13.6  HCT 43.6 45.0 40.9  MCV 90.6 89.8 90.1  MCH 30.1 29.3 30.0  MCHC 33.3 32.7 33.3  RDW 12.7 12.5 12.6  PLT 194 167 171    Cardiac Enzymes Recent Labs  Lab 11/09/17 0356 11/09/17 0824 11/09/17 1640  TROPONINI <0.03 <0.03 <0.03    Recent Labs  Lab 11/08/17 2341  TROPIPOC 0.00     BNP Recent Labs  Lab 11/09/17 0356  BNP 14.6     DDimer No results for input(s): DDIMER in the last 168 hours.   Radiology    Dg Chest 2 View  Result Date: 11/08/2017 CLINICAL DATA:  Chest pain EXAM: CHEST - 2 VIEW COMPARISON:  06/29/2017 FINDINGS: The heart size and mediastinal contours are  within normal limits. Both lungs are clear. The visualized skeletal structures are unremarkable. IMPRESSION: No active cardiopulmonary disease. Electronically Signed   By: Donavan Foil M.D.   On: 11/08/2017 23:39    Cardiac Studies   Cath reviewed  Patient Profile     52 y.o. male CAD  Assessment & Plan    1) CAD: COntinue aggressive secondary prevention.  MSK chest pain.  OK to use Tylenol and heating pad.  THere was question of apical thrombus noted.  Plan for limited echo to eval for apical thrombus.  IF present, would treat with Eliquis and Plavix, and stop aspirin.  THe ELiquis would have to stay on for 3 months.   If no thrombus, will continue DAPT per Dr. Evette Georges recs.    Possible discharge later today.     For questions or updates, please contact [place] Please consult [email or web address] for contact info under        Signed, Larae Grooms, MD  11/10/2017, 9:52 AM

## 2017-11-10 NOTE — Discharge Summary (Signed)
[facility]  Physician Discharge Summary   Patient ID: Cesar Woods MRN: [id number] DOB/AGE: 11-08-1965 52 y.o.  Admit date: 11/08/2017 Discharge date: 11/10/2017  PCP: Delilah Shan, MD  DISCHARGE DIAGNOSES:  Coronary artery disease Apical thrombus  RECOMMENDATIONS FOR OUTPATIENT FOLLOW UP: 1. Patient has an appointment with his cardiologist on October 21   DISCHARGE CONDITION: fair  Diet recommendation: Heart healthy  Filed Weights   11/08/17 2322 11/09/17 0458 11/09/17 1322  Weight: 93 kg 92.9 kg 93.9 kg    INITIAL HISTORY: 52 y.o. male with medical history significant of hypertension, hyperlipidemia, GERD, CAD, STEMI, stent placement (restented July 2019), migraine headaches, CHF with EF 40%, testicular cancer in remission (orchidectomy, radiation therapy), who presented with chest pain.  He ruled out for acute currently syndrome.  He was seen by cardiology.  Consultations:  Cardiology  Procedures:  Cardiac catheterization Conclusion     Non-stenotic Prox LAD lesion was previously treated.  Non-stenotic Mid LAD lesion was previously treated.  Ost 2nd Diag lesion is 40% stenosed.  Ramus lesion is 40% stenosed.  Ost 1st Mrg-1 lesion is 30% stenosed.  Ost 1st Mrg-2 lesion is 35% stenosed.  Mid RCA lesion is 20% stenosed.  Prox LAD to Mid LAD lesion is 20% stenosed.  LV end diastolic pressure is mildly elevated.  There is mild to moderate left ventricular systolic dysfunction.   The LAD stent is widely patent with the exception of mild residual narrowing of 20% at the site of recent in-stent restenosis was treated with Cutting Balloon in July 2019.  The diagonal branch which arises from this segment has both antegrade filling and extensive retrograde collateralization being filled from the apical LAD.  The left circumflex marginal vessel has 30 and 35% proximal to mid stenoses.  RCA is a large dominant vessel with 20% smooth mid  narrowing.  Mild to moderate LV dysfunction with an ejection fraction of 40 to 45% with apical akinesis and distal anterolateral hypocontractility.  The apical contour raises concern for possible apical thrombus.  RECOMMENDATION: Medical therapy for concomitant CAD and mild to moderate LV dysfunction high potency statin therapy.  Optimal blood pressure control with target BP less than 130/80.  Recommend repeat echo Doppler study to evaluate for possible apical thrombus.  Recommend dual antiplatelet therapy with Aspirin 81mg  daily and Ticagrelor 90mg  twice daily long-term (beyond 12 months) because of Extensive stents in his LAD system and concomitant CAD     Transthoracic echocardiogram Study Conclusions  - Left ventricle: LVEF is approximately 40% with   akinesis/dyskinesis of distal anterior and apical region. True   apix is dyskinetic. The apex is thickened and is different from   echo in July 2019 Very highly suspicious for layer of mural   thrombus. There are a few beats of spontaneus swirling at apex,   almost clot in formation.This clears however. The cavity size was   normal. Wall thickness was normal.   HOSPITAL COURSE:   Chest pain in the setting of known history of coronary artery disease Patient recently underwent PCI in July.  Patient ruled out for acute coronary syndrome.  Patient was seen by cardiology.  He was continued on aspirin Brilinta beta-blocker and ARB.  He underwent cardiac catheterization.  See above.  No new interventions were performed.  Patient was reassured.  Due to akinetic apex and echocardiogram was done to check for apical thrombus.  Mural thrombus in the apex Echocardiogram was highly suspicious for a mural thrombus.  Discussed with  Dr. Irish Lack with cardiology.  He recommends changing the patient from aspirin and Brilinta to Plavix and Eliquis.  Loading dose of Plavix to be given today prior to discharge.  This change was discussed with the  patient.  He was asked to monitor for side effects of these medications and also to monitor for any signs of bleeding.  Chronic combined systolic and diastolic CHF Well compensated.  EF is about 40%.  Continue ARB.  Not on diuretics at this time.  History of testicular cancer He is status post bilateral orchiectomy radiation treatment.  He is in remission.  Continue testosterone gel.  GERD PPI  Essential hypertension Continue home medications.  Overall stable.  Okay for discharge home today.     PERTINENT LABS:  The results of significant diagnostics from this hospitalization (including imaging, microbiology, ancillary and laboratory) are listed below for reference.    Microbiology: Recent Results (from the past 240 hour(s))  MRSA PCR Screening     Status: None   Collection Time: 11/09/17  4:34 AM  Result Value Ref Range Status   MRSA by PCR NEGATIVE NEGATIVE Final    Comment:        The GeneXpert MRSA Assay (FDA approved for NASAL specimens only), is one component of a comprehensive MRSA colonization surveillance program. It is not intended to diagnose MRSA infection nor to guide or monitor treatment for MRSA infections. Performed at [facility], [place] [address], [place], [place] [address]      Labs: Basic Metabolic Panel: Recent Labs  Lab 11/08/17 2320 11/09/17 1640 11/10/17 0301  NA 138 140 138  K 3.7 3.6 3.5  CL 103 107 106  CO2 25 24 25   GLUCOSE 109* 87 118*  BUN 16 14 14   CREATININE 1.02 0.88 0.89  CALCIUM 9.0 8.6* 8.2*   CBC: Recent Labs  Lab 11/08/17 2320 11/09/17 1640 11/10/17 0301  WBC 9.9 8.8 8.9  HGB 14.5 14.7 13.6  HCT 43.6 45.0 40.9  MCV 90.6 89.8 90.1  PLT 194 167 171   Cardiac Enzymes: Recent Labs  Lab 11/09/17 0356 11/09/17 0824 11/09/17 1640  TROPONINI <0.03 <0.03 <0.03   BNP: BNP (last 3 results) Recent Labs    11/09/17 0356  BNP 14.6    IMAGING STUDIES Dg Chest 2 View  Result Date: 11/08/2017 CLINICAL  DATA:  Chest pain EXAM: CHEST - 2 VIEW COMPARISON:  06/29/2017 FINDINGS: The heart size and mediastinal contours are within normal limits. Both lungs are clear. The visualized skeletal structures are unremarkable. IMPRESSION: No active cardiopulmonary disease. Electronically Signed   By: Donavan Foil M.D.   On: 11/08/2017 23:39    DISCHARGE EXAMINATION: Vitals:   11/10/17 0500 11/10/17 0725 11/10/17 1139 11/10/17 1537  BP: 131/81 (!) 123/97 108/82 (!) 130/93  Pulse: 72 78 74 76  Resp: 16 16 (!) 23 (!) 21  Temp: 98.1 F (36.7 C) 97.8 F (36.6 C) 97.7 F (36.5 C) 98.1 F (36.7 C)  TempSrc: Oral Oral Oral Oral  SpO2: 95% 96% 96% 97%  Weight:      Height:       General appearance: alert, cooperative, appears stated age and no distress Resp: clear to auscultation bilaterally Cardio: regular rate and rhythm, S1, S2 normal, no murmur, click, rub or gallop GI: soft, non-tender; bowel sounds normal; no masses,  no organomegaly  DISPOSITION: Home  Discharge Instructions    Call MD for:  difficulty breathing, headache or visual disturbances   Complete by:  As directed    Call MD for:  extreme fatigue   Complete by:  As directed    Call MD for:  hives   Complete by:  As directed    Call MD for:  persistant dizziness or light-headedness   Complete by:  As directed    Call MD for:  persistant nausea and vomiting   Complete by:  As directed    Call MD for:  severe uncontrolled pain   Complete by:  As directed    Call MD for:  temperature >100.4   Complete by:  As directed    Diet - low sodium heart healthy   Complete by:  As directed    Discharge instructions   Complete by:  As directed    Take your medications as prescribed. Please seek attention immediately if you notice any bleeding as discussed.   You were cared for by a hospitalist during your hospital stay. If you have any questions about your discharge medications or the care you received while you were in the hospital after  you are discharged, you can call the unit and asked to speak with the hospitalist on call if the hospitalist that took care of you is not available. Once you are discharged, your primary care physician will handle any further medical issues. Please note that NO REFILLS for any discharge medications will be authorized once you are discharged, as it is imperative that you return to your primary care physician (or establish a relationship with a primary care physician if you do not have one) for your aftercare needs so that they can reassess your need for medications and monitor your lab values. If you do not have a primary care physician, you can call [phone] for a physician referral.   Increase activity slowly   Complete by:  As directed         Allergies as of 11/10/2017      Reactions   Iodine Anaphylaxis   Shellfish allergy    Shellfish Allergy Anaphylaxis   Penicillins Other (See Comments)   Childhood allergy Has patient had a PCN reaction causing immediate rash, facial/tongue/throat swelling, SOB or lightheadedness with hypotension: Unknown Has patient had a PCN reaction causing severe rash involving mucus membranes or skin necrosis: Unknown Has patient had a PCN reaction that required hospitalization: No Has patient had a PCN reaction occurring within the last 10 years: No If all of the above answers are "NO", then may proceed with Cephalosporin use.   Ace Inhibitors Cough      Medication List    STOP taking these medications   aspirin 81 MG EC tablet   ticagrelor 90 MG Tabs tablet Commonly known as:  BRILINTA     TAKE these medications   apixaban 5 MG Tabs tablet Commonly known as:  ELIQUIS Take 1 tablet (5 mg total) by mouth 2 (two) times daily.   atorvastatin 80 MG tablet Commonly known as:  LIPITOR TAKE 1 TABLET BY MOUTH DAILY AT 6 PM What changed:    how much to take  how to take this  when to take this  additional instructions   carvedilol 25 MG  tablet Commonly known as:  COREG Take 1 tablet (25 mg total) by mouth 2 (two) times daily with a meal.   clopidogrel 75 MG tablet Commonly known as:  PLAVIX Take 1 tablet (75 mg total) by mouth daily. Start taking on:  11/11/2017   EPIPEN 2-PAK 0.3 mg/0.3 mL Soaj injection  Generic drug:  EPINEPHrine INJECT 0.3 MLS INTO THE MUSCLE ONCE **PATIENT NEEDS TO SCHEDULE AN OFFICE VISIT** What changed:  See the new instructions.   losartan 25 MG tablet Commonly known as:  COZAAR Take 1 tablet (25 mg total) by mouth daily.   nitroGLYCERIN 0.4 MG SL tablet Commonly known as:  NITROSTAT Place 1 tablet (0.4 mg total) under the tongue every 5 (five) minutes x 3 doses as needed for chest pain.   omeprazole 20 MG capsule Commonly known as:  PRILOSEC Take 20 mg by mouth daily.   testosterone 50 MG/5GM (1%) Gel Commonly known as:  ANDROGEL Place 5 g onto the skin daily.        Follow-up Information    Troy Sine, MD Follow up.   Specialty:  Cardiology Why:  11/28/17 at 9:20AM Contact information: [address] [place] [address] [phone]        Delilah Shan, MD. Schedule an appointment as soon as possible for a visit in 1 week(s).   Specialty:  Family Medicine Contact information: [address] [place] [address] [phone]           TOTAL DISCHARGE TIME: 58 mins  Bonnielee Haff  [facility] Pager [phone]  11/10/2017, 3:55 PM

## 2017-11-10 NOTE — Progress Notes (Signed)
  Echocardiogram 2D Echocardiogram has been performed.  Cesar Woods 11/10/2017, 2:37 PM

## 2017-11-16 DIAGNOSIS — I25119 Atherosclerotic heart disease of native coronary artery with unspecified angina pectoris: Secondary | ICD-10-CM | POA: Diagnosis not present

## 2017-11-16 DIAGNOSIS — I513 Intracardiac thrombosis, not elsewhere classified: Secondary | ICD-10-CM | POA: Diagnosis not present

## 2017-11-16 DIAGNOSIS — I200 Unstable angina: Secondary | ICD-10-CM | POA: Diagnosis not present

## 2017-11-16 DIAGNOSIS — I5042 Chronic combined systolic (congestive) and diastolic (congestive) heart failure: Secondary | ICD-10-CM | POA: Diagnosis not present

## 2017-11-28 ENCOUNTER — Ambulatory Visit: Payer: BLUE CROSS/BLUE SHIELD | Admitting: Cardiovascular Disease

## 2017-11-28 ENCOUNTER — Encounter: Payer: Self-pay | Admitting: Cardiovascular Disease

## 2017-11-28 VITALS — BP 122/86 | HR 83 | Ht 72.0 in | Wt 211.4 lb

## 2017-11-28 DIAGNOSIS — I255 Ischemic cardiomyopathy: Secondary | ICD-10-CM | POA: Diagnosis not present

## 2017-11-28 DIAGNOSIS — Z7901 Long term (current) use of anticoagulants: Secondary | ICD-10-CM

## 2017-11-28 DIAGNOSIS — Z9861 Coronary angioplasty status: Secondary | ICD-10-CM

## 2017-11-28 DIAGNOSIS — I2510 Atherosclerotic heart disease of native coronary artery without angina pectoris: Secondary | ICD-10-CM | POA: Diagnosis not present

## 2017-11-28 DIAGNOSIS — I5042 Chronic combined systolic (congestive) and diastolic (congestive) heart failure: Secondary | ICD-10-CM

## 2017-11-28 DIAGNOSIS — I513 Intracardiac thrombosis, not elsewhere classified: Secondary | ICD-10-CM

## 2017-11-28 DIAGNOSIS — I2109 ST elevation (STEMI) myocardial infarction involving other coronary artery of anterior wall: Secondary | ICD-10-CM

## 2017-11-28 DIAGNOSIS — Z79899 Other long term (current) drug therapy: Secondary | ICD-10-CM

## 2017-11-28 MED ORDER — SACUBITRIL-VALSARTAN 49-51 MG PO TABS: 1.0000 | ORAL_TABLET | Freq: Two times a day (BID) | ORAL | 3 refills | Status: DC

## 2017-11-28 NOTE — Progress Notes (Signed)
Patient ID: Cesar Woods, male   DOB: May 22, 1965, 52 y.o.   MRN: [id number]    Primary MD: Dr. Nilda Woods  HPI: Cesar Woods is a 52 y.o. male who presents to the office today for a 19 month office visit after 7 follow up cardiology evaluation.  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  In December, he developed substernal chest discomfort which he initially attributed to GERD.  This continued to recur over that day.  The following day he went to work and continued to experience vague discomfort.  He was seen by his primary physician, Dr. Scarlette Woods in his ECG was abnormal.  Changes of an anterolateral MI.  He presented to the emergency room.  He was not having any chest pain or shortness of breath or palpitations.  He underwent cardiac catheterization the following morning on 01/15/2015 which revealed significant CAD with 90% ostial LAD stenosis followed by 99% proximal LAD stenosis in the region of the first septal perforating artery with significant thrombus.  There was diffuse 50% stenosis followed by 95% ulcerated plaque in the proximal to mid LAD.  There also appeared to be an apical LAD dissection.  40% ramus intermediate, like high marginal stenosis and a large dominant RCA with 30% mid stenosis.  Large PDA and PLA vessels with septal collateralization to the proximal LAD.  He was felt to have an out of hospital late presentation anterolateral infarction secondary to his multiple high-grade stenoses.  He underwent a very difficult but successful intervention to his diffusely diseased LAD.  There was evidence for a spiral dissection in the mid distal LAD but ultimately was insertion of 3 tandem synergy DES stents (0.538, 3.038, and 3.020 mm from the mid LAD to the ostium and PTCA of the distal LAD with low-level inflation tach up his apical initial dissection.  There was total occlusion of  the diagonal vessel antegrade but there was significant development of retrograde collaterals to this diagonal vessel from the distal LAD.  Subsequent, he stabilized well.  He was seen in follow-up by Cesar Woods on 01/24/2015 and was doing well without chest pain or shortness of breath.  At that time his blood pressure was controlled.  He participated in the cardiac rehabilitation program.  He is back at work and has a stressful job in Engineer, mining for [employer].   When I saw him for follow-up evaluation in March 2017.  His ECG showed Q waves in V1 through V4 4 with persistent T-wave inversion anteriorly and anterolaterally.  I recommended that he discontinue metoprolol succinate and changed him to carvedilol.  He is now on carvedilol at 6.25 mg twice a day and is tolerating this well.  He continues to be on low-dose losartan 12.5 mg.  I scheduled him for follow-up echo Doppler study to reassess his LV function.  This was done on 05/30/2015.  Ejection fraction was now 40-45% and there was evidence for akinesis of the apical myocardium.  There was grade 1 diastolic dysfunction.  When I saw him in May 2017 for follow-up evaluation, he denied recurrent chest pain, PND or orthopnea.  At time, I slowly titrated ARB therapy and increase losartan to 25 mg at bedtime.  So increased his carvedilol to 9.375 mg twice a day for 2 weeks and further titrated this to 12.5 mg daily.    He developed a recurrent episode of chest pain in  October 2017 and was hospitalized.  A nuclear perfusion study was performed which showed his previous large fixed defect involving the apex extending towards the mid ventricle in the anterior and lateral wall.  There is very minimal if any peri-infarct ischemia with an EF of 43%.  These were felt to be consistent with his prior MI.    Since I last saw him in March 2018 he had been evaluated in the emergency room with intermittent chest and back pain.  He had undergone a stress Myoview  study in January 2019 which showed an EF of 40% with a large defect consistent with his prior LAD infarction and there was no significant ischemia except mild peri-infarction ischemia.  In July 2019 he developed recurrent symptomatology and when I was on vacation and underwent repeat catheterization by Dr. Ellyn Woods.  He was found to have 85% narrowing in the midportion of his long LAD stent in the region of the diagonal takeoff.  The diagonal vessel was occluded but had excellent collaterals.  He was rehospitalized in October 2019 with recurrent chest pain.  At that time on November 09, 2017 I performed repeat cardiac catheterization.  This revealed a widely patent LAD stent with mild residual narrowing of 20% at the site of recent in-stent restenosis that was treated with Cutting Balloon in July 2019.  The diagonal branch which arose from this segment has both antegrade filling and extensive retrograde collateralization being filled from the apical LAD.  The left circumflex marginal vessel had 30 and 35% proximal to mid stenoses.  The RCA was a large dominant vessel with smooth 20% narrowing.  He had mild to moderate LV dysfunction and his ejection fraction was improved to 40 to 45% with apical kinesis and distal anterolateral hypocontractility.  His apical contour raised concern for possible apical thrombus.  An echo Doppler study was suspicious for a layer of mural thrombus apically which was different from July 2019.  As result he was started on apixaban.  Over the last several weeks, he has continued to remain stable and has been without chest pain.  He is on Eliquis 5 mg, Plavix 75 mg, carvedilol 25 mg twice a day, atorvastatin 80 mg and has been on losartan 25 mg daily.  He denies chest tightness.  He denies baseline shortness of breath but does note some very mild shortness of breath if he exerts himself.  He presents for evaluation.  Past Medical History:  Diagnosis Date  . ACS (acute coronary  syndrome) ([place]) 11/2015  . Atypical chest pain    Negative Myoview 2010  . Coronary artery disease    a.s/p STEMI in 01/2015 requiring placement of 4 DES to LAD. b. 11/2015: NST showing apical scar with minimal peri-infarct ischemia.  . Essential hypertension   . Food allergy    Anaphylaxis with shell fish  . GERD (gastroesophageal reflux disease)   . History of migraines   . Hyperlipidemia   . Ischemic cardiomyopathy    a. Echo 05/2015: EF 40-45%, apical akineiss with Grade 1 DD.  [name] Myocardial infarction ([place]) 2016  . Testicle cancer ([place])    In remission - followed by Dr. Risa Grill; had bilateral orchiectomy and XRT in past    Past Surgical History:  Procedure Laterality Date  . CARDIAC CATHETERIZATION N/A 01/15/2015   Procedure: Left Heart Cath and Coronary Angiography;  Surgeon: Troy Sine, MD;  Location: [facility];  Service: Cardiovascular;  Laterality: N/A;  . CARDIAC CATHETERIZATION  01/15/2015  Procedure: Coronary Stent Intervention;  Surgeon: Troy Sine, MD;  Location: [facility];  Service: Cardiovascular;;  . CORONARY BALLOON ANGIOPLASTY N/A 08/17/2017   Procedure: CORONARY BALLOON ANGIOPLASTY;  Surgeon: Leonie Man, MD;  Location: [facility];  Service: Cardiovascular;  Laterality: N/A;  . LEFT HEART CATH AND CORONARY ANGIOGRAPHY N/A 08/17/2017   Procedure: LEFT HEART CATH AND CORONARY ANGIOGRAPHY;  Surgeon: Leonie Man, MD;  Location: [facility];  Service: Cardiovascular;  Laterality: N/A;  . LEFT HEART CATH AND CORONARY ANGIOGRAPHY N/A 11/09/2017   Procedure: LEFT HEART CATH AND CORONARY ANGIOGRAPHY;  Surgeon: Troy Sine, MD;  Location: [facility];  Service: Cardiovascular;  Laterality: N/A;  . ORCHIECTOMY Bilateral     Allergies  Allergen Reactions  . Iodine Anaphylaxis    Shellfish allergy   . Shellfish Allergy Anaphylaxis  . Penicillins Other (See Comments)    Childhood allergy Has patient had a PCN  reaction causing immediate rash, facial/tongue/throat swelling, SOB or lightheadedness with hypotension: Unknown Has patient had a PCN reaction causing severe rash involving mucus membranes or skin necrosis: Unknown Has patient had a PCN reaction that required hospitalization: No Has patient had a PCN reaction occurring within the last 10 years: No If all of the above answers are "NO", then may proceed with Cephalosporin use.    . Ace Inhibitors Cough    Current Outpatient Medications  Medication Sig Dispense Refill  . apixaban (ELIQUIS) 5 MG TABS tablet Take 1 tablet (5 mg total) by mouth 2 (two) times daily. 60 tablet 1  . atorvastatin (LIPITOR) 80 MG tablet TAKE 1 TABLET BY MOUTH DAILY AT 6 PM (Patient taking differently: Take 80 mg by mouth daily at 6 PM. ) 90 tablet 3  . carvedilol (COREG) 25 MG tablet Take 1 tablet (25 mg total) by mouth 2 (two) times daily with a meal. 60 tablet 6  . clopidogrel (PLAVIX) 75 MG tablet Take 1 tablet (75 mg total) by mouth daily. 30 tablet 1  . EPIPEN 2-PAK 0.3 MG/0.3ML SOAJ injection INJECT 0.3 MLS INTO THE MUSCLE ONCE **PATIENT NEEDS TO SCHEDULE AN OFFICE VISIT** (Patient taking differently: Inject 0.3 mg into the muscle daily as needed (allergic reaction). ) 2 Device 0  . nitroGLYCERIN (NITROSTAT) 0.4 MG SL tablet Place 1 tablet (0.4 mg total) under the tongue every 5 (five) minutes x 3 doses as needed for chest pain. 25 tablet 12  . omeprazole (PRILOSEC) 20 MG capsule Take 20 mg by mouth daily.    [name] testosterone (ANDROGEL) 50 MG/5GM (1%) GEL Place 5 g onto the skin daily.    . sacubitril-valsartan (ENTRESTO) 49-51 MG Take 1 tablet by mouth 2 (two) times daily. 60 tablet 3   No current facility-administered medications for this visit.     Social History   Socioeconomic History  . Marital status: Married    Spouse name: Not on file  . Number of children: Not on file  . Years of education: Not on file  . Highest education level: Not on file    Occupational History  . Not on file  Social Needs  . Financial resource strain: Not on file  . Food insecurity:    Worry: Not on file    Inability: Not on file  . Transportation needs:    Medical: Not on file    Non-medical: Not on file  Tobacco Use  . Smoking status: Former Research scientist (life sciences)  . Smokeless tobacco: Never Used  .  Tobacco comment: quit around 2010  Substance and Sexual Activity  . Alcohol use: Yes    Alcohol/week: 0.0 standard drinks    Comment: occasionally  . Drug use: No    Comment: remote use of marijuana, quit a long time ago  . Sexual activity: Not on file  Lifestyle  . Physical activity:    Days per week: Not on file    Minutes per session: Not on file  . Stress: Not on file  Relationships  . Social connections:    Talks on phone: Not on file    Gets together: Not on file    Attends religious service: Not on file    Active member of club or organization: Not on file    Attends meetings of clubs or organizations: Not on file    Relationship status: Not on file  . Intimate partner violence:    Fear of current or ex partner: Not on file    Emotionally abused: Not on file    Physically abused: Not on file    Forced sexual activity: Not on file  Other Topics Concern  . Not on file  Social History Narrative   Occupation:Senior Trader for [employer]   Divorced    No children      Former smoker    Alcohol Use - yes         Additional social history is notable that he was born in [place], [place].  He lived for over a decade in [place].  He is married.  Former smoker.  Family History  Problem Relation Age of Onset  . Heart attack Father        CABG at agae 61  . Stroke Paternal Grandfather   . Hypertension Unknown   . Hyperlipidemia Unknown   . Heart attack Maternal Grandmother     ROS General: Negative; No fevers, chills, or night sweats HEENT: Negative; No changes in vision or hearing, sinus congestion, difficulty swallowing Pulmonary:  Negative; No cough, wheezing, shortness of breath, hemoptysis Cardiovascular: See HPI: No recurrent chest pain, presyncope, syncope, palpatations GI: Negative; No nausea, vomiting, diarrhea, or abdominal pain GU: History of testicular cancer, status post orchiectomy. Musculoskeletal: Negative; no myalgias, joint pain, or weakness Hematologic: Negative; no easy bruising, bleeding Endocrine: Negative; no heat/cold intolerance; no diabetes, Neuro: Negative; no changes in balance, headaches Skin: Negative; No rashes or skin lesions Psychiatric: Negative; No behavioral problems, depression Sleep: Negative; No snoring,  daytime sleepiness, hypersomnolence, bruxism, restless legs, hypnogognic hallucinations. Other comprehensive 14 point system review is negative   Physical Exam BP 122/86   Pulse 83   Ht 6' (1.829 m)   Wt 211 lb 6.4 oz (95.9 kg)   BMI 28.67 kg/m    Repeat blood pressure 120/82  Wt Readings from Last 3 Encounters:  11/28/17 211 lb 6.4 oz (95.9 kg)  11/09/17 207 lb (93.9 kg)  09/13/17 207 lb (93.9 kg)    General: Alert, oriented, no distress.  Skin: normal turgor, no rashes, warm and dry HEENT: Normocephalic, atraumatic. Pupils equal round and reactive to light; sclera anicteric; extraocular muscles intact;  Nose without nasal septal hypertrophy Mouth/Parynx benign; Mallinpatti scale 3 Neck: No JVD, no carotid bruits; normal carotid upstroke Lungs: clear to ausculatation and percussion; no wheezing or rales Chest wall: without tenderness to palpitation Heart: PMI not displaced, RRR, s1 s2 normal, 1/6 systolic murmur, no diastolic murmur, no rubs, gallops, thrills, or heaves Abdomen: soft, nontender; no hepatosplenomehaly, BS+; abdominal aorta nontender and not  dilated by palpation. Back: no CVA tenderness Pulses 2+ Musculoskeletal: full range of motion, normal strength, no joint deformities Extremities: no clubbing cyanosis or edema, Homan's sign negative   Neurologic: grossly nonfocal; Cranial nerves grossly wnl Psychologic: Normal mood and affect   ECG (independently read by me): Normal sinus rhythm at 83 bpm.  QS complex V1 through V5 consistent with old anterior MI.  Normal intervals.  No ectopy.  March 2019 ECG (independently read by me): Normal sinus rhythm at 79 bpm.  Old anterolateral infarct changes.  QRS complex V1 through V5; normal intervals.  No significant ST changes.  January 2018 ECG (independently read by me): Normal sinus rhythm at 92 bpm.  Anterior Q waves consistent with his prior anterolateral MI.  September 2017 ECG (independently read by me): Sinus rhythm at 83 bpm.  QRS complex V1-V6  concordant with his large anterolateral MI.  May 2017 ECG (independently read by me): Normal sinus rhythm at 79 bpm.  T-wave inversion V2 through V6 and 1 and aVL concordant with his prior anterior to anterolateral MI.  March 2017 ECG (independently read by me): NSR at 80; QS V1-4 with T wave inversion V1-V6, I and aVL  LABS:  BMP Latest Ref Rng & Units 11/10/2017 11/09/2017 11/08/2017  Glucose 70 - 99 mg/dL 118(H) 87 109(H)  BUN 6 - 20 mg/dL '14 14 16  ' Creatinine 0.61 - 1.24 mg/dL 0.89 0.88 1.02  BUN/Creat Ratio 9 - 20 - - -  Sodium 135 - 145 mmol/L 138 140 138  Potassium 3.5 - 5.1 mmol/L 3.5 3.6 3.7  Chloride 98 - 111 mmol/L 106 107 103  CO2 22 - 32 mmol/L '25 24 25  ' Calcium 8.9 - 10.3 mg/dL 8.2(L) 8.6(L) 9.0     Hepatic Function Latest Ref Rng & Units 02/15/2017 04/16/2015 01/14/2015  Total Protein 6.0 - 8.5 g/dL 7.0 6.7 7.0  Albumin 3.5 - 5.5 g/dL 4.7 4.3 4.1  AST 0 - 40 IU/L 17 15 82(H)  ALT 0 - 44 IU/L 19 14 34  Alk Phosphatase 39 - 117 IU/L 92 96 74  Total Bilirubin 0.0 - 1.2 mg/dL 2.3(H) 1.5(H) 1.6(H)  Bilirubin, Direct 0.00 - 0.40 mg/dL 0.15 - -    CBC Latest Ref Rng & Units 11/10/2017 11/09/2017 11/08/2017  WBC 4.0 - 10.5 K/uL 8.9 8.8 9.9  Hemoglobin 13.0 - 17.0 g/dL 13.6 14.7 14.5  Hematocrit 39.0 - 52.0 % 40.9 45.0 43.6   Platelets 150 - 400 K/uL 171 167 194   Lab Results  Component Value Date   MCV 90.1 11/10/2017   MCV 89.8 11/09/2017   MCV 90.6 11/08/2017    Lab Results  Component Value Date   TSH 0.401 (L) 08/10/2017    BNP    Component Value Date/Time   BNP 14.6 11/09/2017 0356   BNP <2.0 06/26/2010 1334    ProBNP    Component Value Date/Time   PROBNP <2.0 pg/mL 10/17/2009 1834     Lipid Panel     Component Value Date/Time   CHOL 94 11/09/2017 0356   CHOL 80 (L) 02/15/2017 1030   TRIG 136 11/09/2017 0356   HDL 27 (L) 11/09/2017 0356   HDL 30 (L) 02/15/2017 1030   CHOLHDL 3.5 11/09/2017 0356   VLDL 27 11/09/2017 0356   LDLCALC 40 11/09/2017 0356   LDLCALC 29 02/15/2017 1030   LDLDIRECT 99.9 09/15/2012 1738     RADIOLOGY: Dg Chest 2 View  Result Date: 11/08/2017 CLINICAL DATA:  Chest pain EXAM:  CHEST - 2 VIEW COMPARISON:  06/29/2017 FINDINGS: The heart size and mediastinal contours are within normal limits. Both lungs are clear. The visualized skeletal structures are unremarkable. IMPRESSION: No active cardiopulmonary disease. Electronically Signed   By: Donavan Foil M.D.   On: 11/08/2017 23:39    IMPRESSION:  1. Ischemic cardiomyopathy   2. ST elevation myocardial infarction (STEMI) of anterolateral wall (HCC) -- Delayed presentation, pain free on admission   3. CAD S/P percutaneous coronary angioplasty   4. Chronic combined systolic and diastolic heart failure ([place])   5. Medication management   6. Apical mural thrombus   7. Anticoagulation adequate     ASSESSMENT AND PLAN: Cesar. Calvert Charland is a 52 year old white male who presented to [facility] on the evening of 01/14/2015 with late presentation following an anterolateral MI which most likely occurred the day prior to his presentation.  In the emergency room, he was pain-free. Cardiac catheterization revealed severe diffuse CAD with ulcerated plaque and thrombus and evidence for spontaneous distal LAD  dissection.  He had a very difficult but successful PCI to his LAD, and ultimate insertion of 3 Synergy DES stents covering a total of 96 mm and extending from the ostium to the mid LAD.  He also had PTCA of his distal LAD dissection.  He has done remarkably well since that time and has been participating in cardiac rehabilitation.  A echo Doppler showed an EF of 40-45% and apical akinesis.  He had done well without anginal symptomatology until July 2019 when he was found to have very focal 85% in-stent restenosis in the LAD stent at the region of the diagonal vessel with occlusion of the diagonal.  He had excellent collateralization to this diagonal vessel.  He underwent successful cutting balloon.  At his most recent catheterization on November 09, 2017 his LAD stent was patent and there was now both antegrade and extensive retrograde collateralization to the diagonal vessel.  He has been without recurrent anginal symptomatology.  At that time, his left ventriculogram suggested probable apical mural thrombus which on subsequent echo was very suggestive of an early forming thrombus.  As result he has been on anticoagulation with Eliquis.  His Brilinta was discontinued and he was switched to Plavix and is no longer on aspirin.  With his EF of 40 to 45%, I discussed data regarding Entresto compared to ARB therapy.  For this reason, I will discontinue losartan and I have given him samples of Entresto 24/26 mg twice daily to take for the next 2 weeks.  If he tolerates this he will further titrate this to 49/51 mg twice daily.  I will see him in 6 weeks for follow-up evaluation.  Follow-up laboratory will be obtained.  He continues to be on atorvastatin 80 mg daily with target LDL less than 70.  Most recent LDL in October 2019 was excellent at 40.   Time spent: 25 minutes Troy Sine, MD, [facility]  11/30/2017 8:03 AM

## 2017-11-28 NOTE — Patient Instructions (Addendum)
Medication Instructions:  STOP losartan START Entresto 24/26 mg two times daily for 2 weeks (samples provided) Then INCREASE Entresto to 49/51 mg two times daily (use 30 day voucher for first month)  If you need a refill on your cardiac medications before your next appointment, please call your pharmacy.   Lab work: Please return for labs in 6 weeks (prior to appt) (CMET, Pro BNP)  Our in office lab hours are Monday-Friday 8:00-4:00, closed for lunch 12:45-1:45 pm.  No appointment needed.  If you have labs (blood work) drawn today and your tests are completely normal, you will receive your results only by: [name] MyChart Message (if you have MyChart) OR . A paper copy in the mail If you have any lab test that is abnormal or we need to change your treatment, we will call you to review the results.  Follow-Up: At [facility], you and your health needs are our priority.  As part of our continuing mission to provide you with exceptional heart care, we have created designated Provider Care Teams.  These Care Teams include your primary Cardiologist (physician) and Advanced Practice Providers (APPs -  Physician Assistants and Nurse Practitioners) who all work together to provide you with the care you need, when you need it. You will need a follow up appointment in 6 weeks (ok to use hold).  Please call our office 2 months in advance to schedule this appointment.  You may see [name], MD or one of the following Advanced Practice Providers on your designated Care Team: [place], [place] . Fabian Sharp, PA-C

## 2017-11-30 ENCOUNTER — Encounter: Payer: Self-pay | Admitting: Cardiovascular Disease

## 2018-01-09 ENCOUNTER — Encounter: Payer: Self-pay | Admitting: Cardiovascular Disease

## 2018-01-09 ENCOUNTER — Ambulatory Visit: Payer: BLUE CROSS/BLUE SHIELD | Admitting: Cardiovascular Disease

## 2018-01-09 VITALS — BP 134/92 | HR 75 | Ht 72.0 in | Wt 214.8 lb

## 2018-01-09 DIAGNOSIS — Z79899 Other long term (current) drug therapy: Secondary | ICD-10-CM

## 2018-01-09 DIAGNOSIS — I255 Ischemic cardiomyopathy: Secondary | ICD-10-CM

## 2018-01-09 DIAGNOSIS — I2510 Atherosclerotic heart disease of native coronary artery without angina pectoris: Secondary | ICD-10-CM

## 2018-01-09 DIAGNOSIS — E785 Hyperlipidemia, unspecified: Secondary | ICD-10-CM

## 2018-01-09 DIAGNOSIS — I513 Intracardiac thrombosis, not elsewhere classified: Secondary | ICD-10-CM | POA: Diagnosis not present

## 2018-01-09 DIAGNOSIS — I5042 Chronic combined systolic (congestive) and diastolic (congestive) heart failure: Secondary | ICD-10-CM | POA: Diagnosis not present

## 2018-01-09 DIAGNOSIS — I10 Essential (primary) hypertension: Secondary | ICD-10-CM

## 2018-01-09 DIAGNOSIS — Z9861 Coronary angioplasty status: Secondary | ICD-10-CM

## 2018-01-09 MED ORDER — SACUBITRIL-VALSARTAN 97-103 MG PO TABS: 1.0000 | ORAL_TABLET | Freq: Two times a day (BID) | ORAL | 3 refills | Status: DC

## 2018-01-09 MED ORDER — APIXABAN 5 MG PO TABS: 5.0000 mg | ORAL_TABLET | Freq: Two times a day (BID) | ORAL | 3 refills | Status: DC

## 2018-01-09 MED ORDER — CLOPIDOGREL BISULFATE 75 MG PO TABS: 75.0000 mg | ORAL_TABLET | Freq: Every day | ORAL | 3 refills | Status: DC

## 2018-01-09 NOTE — Progress Notes (Signed)
Patient ID: Cesar Woods, male   DOB: 01/27/1966, 52 y.o.   MRN: [id number]    Primary MD: [name]  HPI: Cesar Woods is a 52 y.o. male who presents to the office today for a 6 week follow-up cardiology evaluation.  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  In December, he developed substernal chest discomfort which he initially attributed to GERD.  This continued to recur over that day.  The following day he went to work and continued to experience vague discomfort.  He was seen by his primary physician, Dr. Scarlette Ar in his ECG was abnormal.  Changes of an anterolateral MI.  He presented to the emergency room.  He was not having any chest pain or shortness of breath or palpitations.  He underwent cardiac catheterization the following morning on 01/15/2015 which revealed significant CAD with 90% ostial LAD stenosis followed by 99% proximal LAD stenosis in the region of the first septal perforating artery with significant thrombus.  There was diffuse 50% stenosis followed by 95% ulcerated plaque in the proximal to mid LAD.  There also appeared to be an apical LAD dissection.  40% ramus intermediate, like high marginal stenosis and a large dominant RCA with 30% mid stenosis.  Large PDA and PLA vessels with septal collateralization to the proximal LAD.  He was felt to have an out of hospital late presentation anterolateral infarction secondary to his multiple high-grade stenoses.  He underwent a very difficult but successful intervention to his diffusely diseased LAD.  There was evidence for a spiral dissection in the mid distal LAD but ultimately was insertion of 3 tandem synergy DES stents (0.538, 3.038, and 3.020 mm from the mid LAD to the ostium and PTCA of the distal LAD with low-level inflation tach up his apical initial dissection.  There was total occlusion of the diagonal vessel  antegrade but there was significant development of retrograde collaterals to this diagonal vessel from the distal LAD.  Subsequent, he stabilized well.  He was seen in follow-up by Tenny Craw on 01/24/2015 and was doing well without chest pain or shortness of breath.  At that time his blood pressure was controlled.  He participated in the cardiac rehabilitation program.  He is back at work and has a stressful job in Engineer, mining for [employer].   When I saw him for follow-up evaluation in March 2017.  His ECG showed Q waves in V1 through V4 4 with persistent T-wave inversion anteriorly and anterolaterally.  I recommended that he discontinue metoprolol succinate and changed him to carvedilol.  He is now on carvedilol at 6.25 mg twice a day and is tolerating this well.  He continues to be on low-dose losartan 12.5 mg.  I scheduled him for follow-up echo Doppler study to reassess his LV function.  This was done on 05/30/2015.  Ejection fraction was now 40-45% and there was evidence for akinesis of the apical myocardium.  There was grade 1 diastolic dysfunction.  When I saw him in May 2017 for follow-up evaluation, he denied recurrent chest pain, PND or orthopnea.  At time, I slowly titrated ARB therapy and increase losartan to 25 mg at bedtime.  So increased his carvedilol to 9.375 mg twice a day for 2 weeks and further titrated this to 12.5 mg daily.    He developed a recurrent episode of chest pain in October 2017 and was hospitalized.  A nuclear perfusion study was performed which showed his previous large fixed defect involving the apex extending towards the mid ventricle in the anterior and lateral wall.  There is very minimal if any peri-infarct ischemia with an EF of 43%.  These were felt to be consistent with his prior MI.    Since I last saw him in March 2018 he had been evaluated in the emergency room with intermittent chest and back pain.  He had undergone a stress Myoview study in January 2019  which showed an EF of 40% with a large defect consistent with his prior LAD infarction and there was no significant ischemia except mild peri-infarction ischemia.  In July 2019 he developed recurrent symptomatology and when I was on vacation and underwent repeat catheterization by Dr. Ellyn Hack.  He was found to have 85% narrowing in the midportion of his long LAD stent in the region of the diagonal takeoff.  The diagonal vessel was occluded but had excellent collaterals.  He was rehospitalized in October 2019 with recurrent chest pain.  At that time on November 09, 2017 I performed repeat cardiac catheterization.  This revealed a widely patent LAD stent with mild residual narrowing of 20% at the site of recent in-stent restenosis that was treated with Cutting Balloon in July 2019.  The diagonal branch which arose from this segment has both antegrade filling and extensive retrograde collateralization being filled from the apical LAD.  The left circumflex marginal vessel had 30 and 35% proximal to mid stenoses.  The RCA was a large dominant vessel with smooth 20% narrowing.  He had mild to moderate LV dysfunction and his ejection fraction was improved to 40 to 45% with apical kinesis and distal anterolateral hypocontractility.  His apical contour raised concern for possible apical thrombus.  An echo Doppler study was suspicious for a layer of mural thrombus apically which was different from July 2019.  As result he was started on apixaban.  I saw him for follow-up on November 28, 2017 at which time he continued to remain stable and has been without chest pain.  He is on Eliquis 5 mg, Plavix 75 mg, carvedilol 25 mg twice a day, atorvastatin 80 mg and has been on losartan 25 mg daily.  He denies chest tightness, baseline shortness of breath but had noted some mild shortness of breath with exertion.  During that evaluation, I discontinued losartan and initiated Entresto.  He took 24/25 mg twice daily for 2 weeks and  was then further titrated up to 49/51 mg twice a day.  He has continued to be on atorvastatin 80 mg with target LDL less than 70.  Since initiating Entresto he has felt improved.  He denies chest pain PND orthopnea.  He denies any dyspnea on exertion.  He presents for evaluation.  Past Medical History:  Diagnosis Date  . ACS (acute coronary syndrome) ([place]) 11/2015  . Atypical chest pain    Negative Myoview 2010  . Coronary artery disease    a.s/p STEMI in 01/2015 requiring placement of 4 DES to LAD. b. 11/2015: NST showing apical scar with minimal peri-infarct ischemia.  . Essential hypertension   . Food allergy    Anaphylaxis with shell fish  . GERD (gastroesophageal reflux disease)   . History of migraines   . Hyperlipidemia   . Ischemic cardiomyopathy    a. Echo 05/2015: EF 40-45%, apical akineiss with Grade 1 DD.  [name] Myocardial infarction ([place]) 2016  . Testicle cancer ([place])  In remission - followed by Dr. Risa Grill; had bilateral orchiectomy and XRT in past    Past Surgical History:  Procedure Laterality Date  . CARDIAC CATHETERIZATION N/A 01/15/2015   Procedure: Left Heart Cath and Coronary Angiography;  Surgeon: Troy Sine, MD;  Location: [facility];  Service: Cardiovascular;  Laterality: N/A;  . CARDIAC CATHETERIZATION  01/15/2015   Procedure: Coronary Stent Intervention;  Surgeon: Troy Sine, MD;  Location: [facility];  Service: Cardiovascular;;  . CORONARY BALLOON ANGIOPLASTY N/A 08/17/2017   Procedure: CORONARY BALLOON ANGIOPLASTY;  Surgeon: Leonie Man, MD;  Location: [facility];  Service: Cardiovascular;  Laterality: N/A;  . LEFT HEART CATH AND CORONARY ANGIOGRAPHY N/A 08/17/2017   Procedure: LEFT HEART CATH AND CORONARY ANGIOGRAPHY;  Surgeon: Leonie Man, MD;  Location: [facility];  Service: Cardiovascular;  Laterality: N/A;  . LEFT HEART CATH AND CORONARY ANGIOGRAPHY N/A 11/09/2017   Procedure: LEFT HEART CATH AND CORONARY  ANGIOGRAPHY;  Surgeon: Troy Sine, MD;  Location: [facility];  Service: Cardiovascular;  Laterality: N/A;  . ORCHIECTOMY Bilateral     Allergies  Allergen Reactions  . Iodine Anaphylaxis    Shellfish allergy   . Shellfish Allergy Anaphylaxis  . Penicillins Other (See Comments)    Childhood allergy Has patient had a PCN reaction causing immediate rash, facial/tongue/throat swelling, SOB or lightheadedness with hypotension: Unknown Has patient had a PCN reaction causing severe rash involving mucus membranes or skin necrosis: Unknown Has patient had a PCN reaction that required hospitalization: No Has patient had a PCN reaction occurring within the last 10 years: No If all of the above answers are "NO", then may proceed with Cephalosporin use.    . Ace Inhibitors Cough    Current Outpatient Medications  Medication Sig Dispense Refill  . apixaban (ELIQUIS) 5 MG TABS tablet Take 1 tablet (5 mg total) by mouth 2 (two) times daily. 180 tablet 3  . atorvastatin (LIPITOR) 80 MG tablet TAKE 1 TABLET BY MOUTH DAILY AT 6 PM (Patient taking differently: Take 80 mg by mouth daily at 6 PM. ) 90 tablet 3  . carvedilol (COREG) 25 MG tablet Take 1 tablet (25 mg total) by mouth 2 (two) times daily with a meal. 60 tablet 6  . clopidogrel (PLAVIX) 75 MG tablet Take 1 tablet (75 mg total) by mouth daily. 90 tablet 3  . EPIPEN 2-PAK 0.3 MG/0.3ML SOAJ injection INJECT 0.3 MLS INTO THE MUSCLE ONCE **PATIENT NEEDS TO SCHEDULE AN OFFICE VISIT** (Patient taking differently: Inject 0.3 mg into the muscle daily as needed (allergic reaction). ) 2 Device 0  . nitroGLYCERIN (NITROSTAT) 0.4 MG SL tablet Place 1 tablet (0.4 mg total) under the tongue every 5 (five) minutes x 3 doses as needed for chest pain. 25 tablet 12  . omeprazole (PRILOSEC) 20 MG capsule Take 20 mg by mouth daily.    [name] testosterone (ANDROGEL) 50 MG/5GM (1%) GEL Place 5 g onto the skin daily.    . sacubitril-valsartan (ENTRESTO) 97-103  MG Take 1 tablet by mouth 2 (two) times daily. 180 tablet 3   No current facility-administered medications for this visit.     Social History   Socioeconomic History  . Marital status: Married    Spouse name: Not on file  . Number of children: Not on file  . Years of education: Not on file  . Highest education level: Not on file  Occupational History  . Not  on file  Social Needs  . Financial resource strain: Not on file  . Food insecurity:    Worry: Not on file    Inability: Not on file  . Transportation needs:    Medical: Not on file    Non-medical: Not on file  Tobacco Use  . Smoking status: Former Research scientist (life sciences)  . Smokeless tobacco: Never Used  . Tobacco comment: quit around 2010  Substance and Sexual Activity  . Alcohol use: Yes    Alcohol/week: 0.0 standard drinks    Comment: occasionally  . Drug use: No    Comment: remote use of marijuana, quit a long time ago  . Sexual activity: Not on file  Lifestyle  . Physical activity:    Days per week: Not on file    Minutes per session: Not on file  . Stress: Not on file  Relationships  . Social connections:    Talks on phone: Not on file    Gets together: Not on file    Attends religious service: Not on file    Active member of club or organization: Not on file    Attends meetings of clubs or organizations: Not on file    Relationship status: Not on file  . Intimate partner violence:    Fear of current or ex partner: Not on file    Emotionally abused: Not on file    Physically abused: Not on file    Forced sexual activity: Not on file  Other Topics Concern  . Not on file  Social History Narrative   Occupation:Senior Trader for [employer]   Divorced    No children      Former smoker    Alcohol Use - yes         Additional social history is notable that he was born in [place], [place].  He lived for over a decade in [place].  He is married.  Former smoker.  Family History  Problem Relation Age of Onset  .  Heart attack Father        CABG at agae 63  . Stroke Paternal Grandfather   . Hypertension Unknown   . Hyperlipidemia Unknown   . Heart attack Maternal Grandmother     ROS General: Negative; No fevers, chills, or night sweats HEENT: Negative; No changes in vision or hearing, sinus congestion, difficulty swallowing Pulmonary: Negative; No cough, wheezing, shortness of breath, hemoptysis Cardiovascular: See HPI: No recurrent chest pain, presyncope, syncope, palpatations GI: Negative; No nausea, vomiting, diarrhea, or abdominal pain GU: History of testicular cancer, status post orchiectomy. Musculoskeletal: Negative; no myalgias, joint pain, or weakness Hematologic: Negative; no easy bruising, bleeding Endocrine: Negative; no heat/cold intolerance; no diabetes, Neuro: Negative; no changes in balance, headaches Skin: Negative; No rashes or skin lesions Psychiatric: Negative; No behavioral problems, depression Sleep: Negative; No snoring,  daytime sleepiness, hypersomnolence, bruxism, restless legs, hypnogognic hallucinations. Other comprehensive 14 point system review is negative   Physical Exam BP (!) 134/92   Pulse 75   Ht 6' (1.829 m)   Wt 214 lb 12.8 oz (97.4 kg)   BMI 29.13 kg/m    Repeat blood pressure by me 128/88  Wt Readings from Last 3 Encounters:  01/09/18 214 lb 12.8 oz (97.4 kg)  11/28/17 211 lb 6.4 oz (95.9 kg)  11/09/17 207 lb (93.9 kg)   General: Alert, oriented, no distress.  Skin: normal turgor, no rashes, warm and dry HEENT: Normocephalic, atraumatic. Pupils equal round and reactive to light; sclera anicteric;  extraocular muscles intact;  Nose without nasal septal hypertrophy Mouth/Parynx benign; Mallinpatti scale 3 Neck: No JVD, no carotid bruits; normal carotid upstroke Lungs: clear to ausculatation and percussion; no wheezing or rales Chest wall: without tenderness to palpitation Heart: PMI not displaced, RRR, s1 s2 normal, 1/6 systolic murmur, no  diastolic murmur, no rubs, gallops, thrills, or heaves Abdomen: soft, nontender; no hepatosplenomehaly, BS+; abdominal aorta nontender and not dilated by palpation. Back: no CVA tenderness Pulses 2+ Musculoskeletal: full range of motion, normal strength, no joint deformities Extremities: no clubbing cyanosis or edema, Homan's sign negative  Neurologic: grossly nonfocal; Cranial nerves grossly wnl Psychologic: Normal mood and affect   ECG (independently read by me): Normal sinus rhythm at 75 bpm.  QS complex V1 through V4 consistent with old anterior MI.  Normal intervals.  No ectopy.  November 28, 2017 ECG (independently read by me): Normal sinus rhythm at 83 bpm.  QS complex V1 through V5 consistent with old anterior MI.  Normal intervals.  No ectopy.  March 2019 ECG (independently read by me): Normal sinus rhythm at 79 bpm.  Old anterolateral infarct changes.  QRS complex V1 through V5; normal intervals.  No significant ST changes.  January 2018 ECG (independently read by me): Normal sinus rhythm at 92 bpm.  Anterior Q waves consistent with his prior anterolateral MI.  September 2017 ECG (independently read by me): Sinus rhythm at 83 bpm.  QRS complex V1-V6  concordant with his large anterolateral MI.  May 2017 ECG (independently read by me): Normal sinus rhythm at 79 bpm.  T-wave inversion V2 through V6 and 1 and aVL concordant with his prior anterior to anterolateral MI.  March 2017 ECG (independently read by me): NSR at 80; QS V1-4 with T wave inversion V1-V6, I and aVL  LABS:  BMP Latest Ref Rng & Units 11/10/2017 11/09/2017 11/08/2017  Glucose 70 - 99 mg/dL 118(H) 87 109(H)  BUN 6 - 20 mg/dL '14 14 16  ' Creatinine 0.61 - 1.24 mg/dL 0.89 0.88 1.02  BUN/Creat Ratio 9 - 20 - - -  Sodium 135 - 145 mmol/L 138 140 138  Potassium 3.5 - 5.1 mmol/L 3.5 3.6 3.7  Chloride 98 - 111 mmol/L 106 107 103  CO2 22 - 32 mmol/L '25 24 25  ' Calcium 8.9 - 10.3 mg/dL 8.2(L) 8.6(L) 9.0     Hepatic  Function Latest Ref Rng & Units 02/15/2017 04/16/2015 01/14/2015  Total Protein 6.0 - 8.5 g/dL 7.0 6.7 7.0  Albumin 3.5 - 5.5 g/dL 4.7 4.3 4.1  AST 0 - 40 IU/L 17 15 82(H)  ALT 0 - 44 IU/L 19 14 34  Alk Phosphatase 39 - 117 IU/L 92 96 74  Total Bilirubin 0.0 - 1.2 mg/dL 2.3(H) 1.5(H) 1.6(H)  Bilirubin, Direct 0.00 - 0.40 mg/dL 0.15 - -    CBC Latest Ref Rng & Units 11/10/2017 11/09/2017 11/08/2017  WBC 4.0 - 10.5 K/uL 8.9 8.8 9.9  Hemoglobin 13.0 - 17.0 g/dL 13.6 14.7 14.5  Hematocrit 39.0 - 52.0 % 40.9 45.0 43.6  Platelets 150 - 400 K/uL 171 167 194   Lab Results  Component Value Date   MCV 90.1 11/10/2017   MCV 89.8 11/09/2017   MCV 90.6 11/08/2017    Lab Results  Component Value Date   TSH 0.401 (L) 08/10/2017    BNP    Component Value Date/Time   BNP 14.6 11/09/2017 0356   BNP <2.0 06/26/2010 1334    ProBNP    Component Value Date/Time  PROBNP <2.0 pg/mL 10/17/2009 1834     Lipid Panel     Component Value Date/Time   CHOL 94 11/09/2017 0356   CHOL 80 (L) 02/15/2017 1030   TRIG 136 11/09/2017 0356   HDL 27 (L) 11/09/2017 0356   HDL 30 (L) 02/15/2017 1030   CHOLHDL 3.5 11/09/2017 0356   VLDL 27 11/09/2017 0356   LDLCALC 40 11/09/2017 0356   LDLCALC 29 02/15/2017 1030   LDLDIRECT 99.9 09/15/2012 1738     RADIOLOGY: No results found.  IMPRESSION:  1. Ischemic cardiomyopathy   2. CAD S/P percutaneous coronary angioplasty   3. Chronic combined systolic and diastolic heart failure (HCC)   4. Apical mural thrombus   5. Essential hypertension   6. Hyperlipidemia with target LDL less than 70   7. Medication management     ASSESSMENT AND PLAN: Cesar. Cesar Woods is a 52 year old white male who presented to [facility] on the evening of 01/14/2015 with late presentation following an anterolateral MI which most likely occurred the day prior to his presentation.  In the emergency room, he was pain-free. Cardiac catheterization revealed severe diffuse CAD  with ulcerated plaque and thrombus and evidence for spontaneous distal LAD dissection.  He had a very difficult but successful PCI to his LAD, and ultimate insertion of 3 Synergy DES stents covering a total of 96 mm and extending from the ostium to the mid LAD.  He also had PTCA of his distal LAD dissection.  He has done remarkably well since that time and has been participating in cardiac rehabilitation.  A echo Doppler showed an EF of 40-45% and apical akinesis.  He had done well without anginal symptomatology until July 2019 when he was found to have very focal 85% in-stent restenosis in the LAD stent at the region of the diagonal vessel with occlusion of the diagonal.  He had excellent collateralization to this diagonal vessel.  He underwent successful cutting balloon.  At his most recent catheterization on November 09, 2017 his LAD stent was patent and there was now both antegrade and extensive retrograde collateralization to the diagonal vessel.  He has been without recurrent anginal symptomatology.  At that time, his left ventriculogram suggested probable apical mural thrombus which on subsequent echo was very suggestive of an early forming thrombus.  As result he has been on anticoagulation with Eliquis.  His Brilinta was discontinued and he was switched to Plavix and is no longer on aspirin.  With his EF of 40 to 45%, I discontinued losartan and switched him to [place].  He has tolerated dose titration up to 49/51 mg twice a day.  His symptoms have improved.  His blood pressure today is stable I will further titrate Entresto to 97/103 mg twice a day for optimal treatment.  He continues to be on Eliquis for his questionable apical thrombus noted at cath and his echo Doppler assessment.  2 weeks he will undergo repeat pro BN P and BMet.  He continues to be on atorvastatin and is tolerating this well without myalgias.  Target LDL is less than 70 and his most recent LDL assessment in October 2019 was excellent  at 40.  March/April 2020 he will undergo a follow-up echo Doppler study to see if there is is been any improvement in LV function and his questionable apical thrombus.  I will see him in the office in follow-up and further recommendations were made at that time.  Time spent: 25 minutes Troy Sine, MD,  [facility]  01/10/2018 7:11 PM

## 2018-01-09 NOTE — Patient Instructions (Addendum)
Medication Instructions:  INCREASE Entresto 97/103 mg two times daily  If you need a refill on your cardiac medications before your next appointment, please call your pharmacy.   Lab work: Please return for labs in 2 weeks (CMET, ProBNP)  Our in office lab hours are Monday-Friday 8:00-4:00, closed for lunch 12:45-1:45 pm.  No appointment needed.  If you have labs (blood work) drawn today and your tests are completely normal, you will receive your results only by: [name] MyChart Message (if you have MyChart) OR . A paper copy in the mail If you have any lab test that is abnormal or we need to change your treatment, we will call you to review the results.  Testing/Procedures: Your physician has requested that you have an echocardiogram in MARCH/APRIL 2020. Echocardiography is a painless test that uses sound waves to create images of your heart. It provides your doctor with information about the size and shape of your heart and how well your heart's chambers and valves are working. This procedure takes approximately one hour. There are no restrictions for this procedure.  Follow-Up: At [facility], you and your health needs are our priority.  As part of our continuing mission to provide you with exceptional heart care, we have created designated Provider Care Teams.  These Care Teams include your primary Cardiologist (physician) and Advanced Practice Providers (APPs -  Physician Assistants and Nurse Practitioners) who all work together to provide you with the care you need, when you need it. You will need a follow up appointment in 3-4 months (after echo).  Please call our office 2 months in advance to schedule this appointment.  You may see [name], MD or one of the following Advanced Practice Providers on your designated Care Team: [place], [place] . Fabian Sharp, PA-C

## 2018-01-10 ENCOUNTER — Encounter: Payer: Self-pay | Admitting: Cardiovascular Disease

## 2018-02-06 ENCOUNTER — Telehealth: Payer: Self-pay | Admitting: Cardiovascular Disease

## 2018-02-06 NOTE — Telephone Encounter (Signed)
Returned call to patient, he states diarrhea started approximately 2 weeks ago, he thought it was something he ate.     This resolved for 1 week then started again.   He said it is extreme diarrhea, similar to when he has to prep for colonoscopy and can hardly make it to the restroom.    Patient made aware of pharmD recommendations and verbalized understanding.    He is also aware he is due for lab work after recent increase in [place].   He will come have this drawn today.

## 2018-02-06 NOTE — Telephone Encounter (Signed)
Patient started [place] in October.  It is not likely the cause of a current bout of diarrhea.  Advise patient to stay hydrated, use Imodium and see PCP or GI MD if continues.  There have been many GI bugs going around.

## 2018-02-06 NOTE — Telephone Encounter (Signed)
New message   Pt c/o medication issue:  1. Name of Medication: sacubitril-valsartan (ENTRESTO) 97-103 MG  2. How are you currently taking this medication (dosage and times per day)? Twice daily  3. Are you having a reaction (difficulty breathing--STAT)?n/a  4. What is your medication issue? Patient states that he is having extreme diarrhea and he believes he is having a reaction to this medication.

## 2018-02-07 DIAGNOSIS — I5042 Chronic combined systolic (congestive) and diastolic (congestive) heart failure: Secondary | ICD-10-CM | POA: Diagnosis not present

## 2018-02-07 DIAGNOSIS — I255 Ischemic cardiomyopathy: Secondary | ICD-10-CM | POA: Diagnosis not present

## 2018-02-07 DIAGNOSIS — Z79899 Other long term (current) drug therapy: Secondary | ICD-10-CM | POA: Diagnosis not present

## 2018-02-08 LAB — COMPREHENSIVE METABOLIC PANEL
ALBUMIN: 4.3 g/dL (ref 3.5–5.5)
ALT: 21 IU/L (ref 0–44)
AST: 15 IU/L (ref 0–40)
Albumin/Globulin Ratio: 2.2 (ref 1.2–2.2)
Alkaline Phosphatase: 97 IU/L (ref 39–117)
BUN / CREAT RATIO: 18 (ref 9–20)
BUN: 15 mg/dL (ref 6–24)
Bilirubin Total: 1.3 mg/dL — ABNORMAL HIGH (ref 0.0–1.2)
CALCIUM: 8.4 mg/dL — AB (ref 8.7–10.2)
CO2: 21 mmol/L (ref 20–29)
Chloride: 107 mmol/L — ABNORMAL HIGH (ref 96–106)
Creatinine, Ser: 0.82 mg/dL (ref 0.76–1.27)
GFR, EST AFRICAN AMERICAN: 118 mL/min/{1.73_m2} (ref >59)
GFR, EST NON AFRICAN AMERICAN: 102 mL/min/{1.73_m2} (ref >59)
GLOBULIN, TOTAL: 2 g/dL (ref 1.5–4.5)
Glucose: 111 mg/dL — ABNORMAL HIGH (ref 65–99)
Potassium: 3.8 mmol/L (ref 3.5–5.2)
Sodium: 144 mmol/L (ref 134–144)
Total Protein: 6.3 g/dL (ref 6.0–8.5)

## 2018-02-08 LAB — PRO B NATRIURETIC PEPTIDE: NT-Pro BNP: 46 pg/mL (ref 0–121)

## 2018-03-03 ENCOUNTER — Other Ambulatory Visit: Payer: Self-pay | Admitting: *Deleted

## 2018-03-03 MED ORDER — CARVEDILOL 25 MG PO TABS: 25.0000 mg | ORAL_TABLET | Freq: Two times a day (BID) | ORAL | 6 refills | Status: DC

## 2018-03-24 DIAGNOSIS — R197 Diarrhea, unspecified: Secondary | ICD-10-CM | POA: Diagnosis not present

## 2018-03-24 DIAGNOSIS — Z86010 Personal history of colonic polyps: Secondary | ICD-10-CM | POA: Diagnosis not present

## 2018-03-29 DIAGNOSIS — R197 Diarrhea, unspecified: Secondary | ICD-10-CM | POA: Diagnosis not present

## 2018-04-07 ENCOUNTER — Telehealth: Payer: Self-pay

## 2018-04-07 DIAGNOSIS — R197 Diarrhea, unspecified: Secondary | ICD-10-CM | POA: Diagnosis not present

## 2018-04-07 NOTE — Telephone Encounter (Signed)
   [facility] Pre-operative Risk Assessment    Request for surgical clearance:  1. What type of surgery is being performed? Colonoscopy    2. When is this surgery scheduled? TBD   3. What type of clearance is required (medical clearance vs. Pharmacy clearance to hold med vs. Both)? Both  4. Are there any medications that need to be held prior to surgery and how long? Eliquis and Plavix   5. Practice name and name of physician performing surgery? [facility] -Dr.Ganem, Deliah Goody, PA   6. What is your office phone number [phone]    7.   What is your office fax number [phone]  8.   Anesthesia type (None, local, MAC, general) ? unknown   Ena Dawley 04/07/2018, 4:27 PM  _________________________________________________________________   (provider comments below)

## 2018-04-10 NOTE — Telephone Encounter (Signed)
Discussed with Joslyn Hy, Pharm.D. and agree

## 2018-04-10 NOTE — Telephone Encounter (Signed)
Patient on Eliquis for a questionable apical thrombus found 11/09/2017. Ideally anticoagulation should be uninterrupted for 6 months. If procedure cannot be post postponed until after 6 months, patient may hold his Eliquis for 1 day prior to procedure. I will route to Dr. Georgina Peer for his input.

## 2018-04-10 NOTE — Telephone Encounter (Signed)
Reviewed with [name] in office today.  Patient is < 6 months from starting anticoagulation for apical thrombus.  Would prefer that patient move colonoscopy out to after April 2.  If this cannot be delayed, may hold just 1 day prior to colonoscopy and restart day of procedure.

## 2018-04-11 NOTE — Telephone Encounter (Signed)
[name], please address holding Plavix for colonoscopy.   Please route response back to [facility] PREOP

## 2018-04-12 NOTE — Telephone Encounter (Signed)
See note by Cyril Mourning not to have procedure until after beginning of April.  Can hold Plavix for 5 days

## 2018-04-13 ENCOUNTER — Ambulatory Visit (HOSPITAL_COMMUNITY): Payer: BLUE CROSS/BLUE SHIELD | Attending: Cardiovascular Disease

## 2018-04-13 DIAGNOSIS — I2510 Atherosclerotic heart disease of native coronary artery without angina pectoris: Secondary | ICD-10-CM

## 2018-04-13 DIAGNOSIS — I255 Ischemic cardiomyopathy: Secondary | ICD-10-CM | POA: Diagnosis not present

## 2018-04-13 DIAGNOSIS — I513 Intracardiac thrombosis, not elsewhere classified: Secondary | ICD-10-CM | POA: Diagnosis not present

## 2018-04-13 DIAGNOSIS — I5042 Chronic combined systolic (congestive) and diastolic (congestive) heart failure: Secondary | ICD-10-CM | POA: Diagnosis not present

## 2018-04-13 DIAGNOSIS — Z9861 Coronary angioplasty status: Secondary | ICD-10-CM

## 2018-04-13 MED ORDER — PERFLUTREN LIPID MICROSPHERE
1.0000 mL | INTRAVENOUS | Status: AC | PRN
  Administered 2018-04-13: 2 mL via INTRAVENOUS

## 2018-04-24 ENCOUNTER — Other Ambulatory Visit: Payer: Self-pay

## 2018-04-24 MED ORDER — ATORVASTATIN CALCIUM 80 MG PO TABS: ORAL_TABLET | ORAL | 0 refills | Status: DC

## 2018-04-28 ENCOUNTER — Telehealth: Payer: Self-pay | Admitting: Cardiovascular Disease

## 2018-04-28 NOTE — Telephone Encounter (Signed)
   Cardiac Questionnaire:    Since your last visit or hospitalization:    1. Have you been having new or worsening chest pain? No   2. Have you been having new or worsening shortness of breath? No 3. Have you been having new or worsening leg swelling, wt gain, or increase in abdominal girth (pants fitting more tightly)? No   4. Have you had any passing out spells? No    *A YES to any of these questions would result in the appointment being kept. *If all the answers to these questions are NO, we should indicate that given the current situation regarding the worldwide coronarvirus pandemic, at the recommendation of the CDC, we are looking to limit gatherings in our waiting area, and thus will reschedule their appointment beyond four weeks from today.   _____________   Pt stated he is fine with cancelling his appointment and rescheduling at a later date. Pt asked if there would be phone visits available. I explained to pt that we do not have all the details of that as of right now, but that is an option being discussed. Pt verbalized understanding and thanks for the call.

## 2018-05-01 ENCOUNTER — Ambulatory Visit: Payer: BLUE CROSS/BLUE SHIELD | Admitting: Cardiovascular Disease

## 2018-07-06 ENCOUNTER — Other Ambulatory Visit: Payer: Self-pay | Admitting: Adult Health

## 2018-09-05 ENCOUNTER — Other Ambulatory Visit: Payer: Self-pay | Admitting: Adult Health

## 2018-09-09 ENCOUNTER — Other Ambulatory Visit: Payer: Self-pay | Admitting: Adult Health

## 2018-09-20 ENCOUNTER — Other Ambulatory Visit: Payer: Self-pay

## 2018-09-20 ENCOUNTER — Telehealth: Payer: Self-pay

## 2018-09-20 ENCOUNTER — Ambulatory Visit (INDEPENDENT_AMBULATORY_CARE_PROVIDER_SITE_OTHER): Payer: BC Managed Care – PPO | Admitting: Cardiovascular Disease

## 2018-09-20 ENCOUNTER — Encounter: Payer: Self-pay | Admitting: Cardiovascular Disease

## 2018-09-20 VITALS — BP 95/70 | HR 73 | Ht 72.0 in | Wt 195.8 lb

## 2018-09-20 DIAGNOSIS — I236 Thrombosis of atrium, auricular appendage, and ventricle as current complications following acute myocardial infarction: Secondary | ICD-10-CM

## 2018-09-20 DIAGNOSIS — I2510 Atherosclerotic heart disease of native coronary artery without angina pectoris: Secondary | ICD-10-CM

## 2018-09-20 DIAGNOSIS — I5042 Chronic combined systolic (congestive) and diastolic (congestive) heart failure: Secondary | ICD-10-CM | POA: Diagnosis not present

## 2018-09-20 DIAGNOSIS — I255 Ischemic cardiomyopathy: Secondary | ICD-10-CM | POA: Diagnosis not present

## 2018-09-20 DIAGNOSIS — Z01810 Encounter for preprocedural cardiovascular examination: Secondary | ICD-10-CM

## 2018-09-20 DIAGNOSIS — I10 Essential (primary) hypertension: Secondary | ICD-10-CM | POA: Diagnosis not present

## 2018-09-20 DIAGNOSIS — Z9861 Coronary angioplasty status: Secondary | ICD-10-CM

## 2018-09-20 MED ORDER — ENTRESTO 49-51 MG PO TABS: 1.0000 | ORAL_TABLET | Freq: Two times a day (BID) | ORAL | 11 refills | Status: DC

## 2018-09-20 NOTE — Progress Notes (Signed)
Cardiology Office Note:   Date:  09/20/2018  NAME:  Cesar Woods    MRN: [id number] DOB:  [birth year]   PCP:  Delilah Shan, MD  Cardiologist:  [name], MD  Electrophysiologist:  None   Referring MD: Delilah Shan, MD   Chief Complaint  Patient presents with   Pre-op Exam   History of Present Illness:   Cesar Woods is a 53 y.o. 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  He is planning to undergo a colonoscopy for routine colon cancer screening in the next few weeks.  This was delayed due to coronavirus pandemic, and GI has requested clearance given the fact that he is on Plavix and Eliquis.  He continues to take these medications and does not miss and reports no bleeding issues.  He also endorses around 10 days of dull achy pain across the precordium.  The pain appears to occur without activity and mainly occurs at rest.  He describes no alleviating or aggravating factors, and he has not attempted nitroglycerin.  He does report a break in his diet recently and an exacerbation of his GERD, and he is now taking his PPI again.  He states he has no limitations with activity and endorses no chest pain with activity, DOE or lower extremity edema.  His blood pressure is a little low, and he does endorse orthostatic symptoms periodically.  His symptoms of orthostasis and low blood pressures have coincided with his increased dose of Entresto.  Past Medical History: Past Medical History:  Diagnosis Date   ACS (acute coronary syndrome) ([place]) 11/2015   Atypical chest pain    Negative Myoview 2010    Coronary artery disease    a.s/p STEMI in 01/2015 requiring placement of 4 DES to LAD. b. 11/2015: NST showing apical scar with minimal peri-infarct ischemia.   Essential hypertension    Food allergy    Anaphylaxis with shell fish   GERD (gastroesophageal reflux disease)    History of migraines    Hyperlipidemia    Ischemic cardiomyopathy    a. Echo 05/2015: EF 40-45%, apical akineiss with Grade 1 DD.   Myocardial infarction 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 Risa Grill; had bilateral orchiectomy and XRT in past    Past Surgical History: Past Surgical History:  Procedure Laterality Date   CARDIAC CATHETERIZATION N/A 01/15/2015   Procedure: Left Heart Cath and Coronary Angiography;  Surgeon: Troy Sine, MD;  Location: [facility];  Service: Cardiovascular;  Laterality: N/A;   CARDIAC CATHETERIZATION  01/15/2015   Procedure: Coronary Stent Intervention;  Surgeon: Troy Sine, MD;  Location: [facility];  Service: Cardiovascular;;   CORONARY BALLOON ANGIOPLASTY N/A 08/17/2017   Procedure: CORONARY BALLOON ANGIOPLASTY;  Surgeon: Leonie Man, MD;  Location: [facility];  Service: Cardiovascular;  Laterality: N/A;   LEFT HEART CATH AND CORONARY ANGIOGRAPHY N/A 08/17/2017   Procedure: LEFT HEART CATH AND CORONARY ANGIOGRAPHY;  Surgeon: Leonie Man, MD;  Location: [facility];  Service: Cardiovascular;  Laterality:  N/A;   LEFT HEART CATH AND CORONARY ANGIOGRAPHY N/A 11/09/2017   Procedure: LEFT HEART CATH AND CORONARY ANGIOGRAPHY;  Surgeon: Troy Sine, MD;  Location: [facility];  Service: Cardiovascular;  Laterality: N/A;   ORCHIECTOMY Bilateral     Current Medications: Current Meds  Medication Sig   apixaban (ELIQUIS) 5 MG TABS tablet Take 1 tablet (5 mg total) by mouth 2 (two) times daily.   atorvastatin (LIPITOR) 80 MG tablet TAKE 1 TABLET BY MOUTH DAILY AT 6 PM   carvedilol (COREG) 25  MG tablet Take 1 tablet (25 mg total) by mouth 2 (two) times daily with a meal.   clopidogrel (PLAVIX) 75 MG tablet Take 1 tablet (75 mg total) by mouth daily.   EPIPEN 2-PAK 0.3 MG/0.3ML SOAJ injection INJECT 0.3 MLS INTO THE MUSCLE ONCE **PATIENT NEEDS TO SCHEDULE AN OFFICE VISIT** (Patient taking differently: Inject 0.3 mg into the muscle daily as needed (allergic reaction). )   nitroGLYCERIN (NITROSTAT) 0.4 MG SL tablet PLACE 1 TABLET BY MOUTH UNDER THE TONGUE EVERY 5 MINUTES FOR 3 DOSES AS NEEDED FOR CHEST PAIN   omeprazole (PRILOSEC) 20 MG capsule Take 20 mg by mouth daily.   sacubitril-valsartan (ENTRESTO) 97-103 MG Take 1 tablet by mouth 2 (two) times daily.   testosterone (ANDROGEL) 50 MG/5GM (1%) GEL Place 5 g onto the skin daily.     Allergies:    Iodine, Shellfish allergy, Penicillins, and Ace inhibitors   Social History: Social History   Socioeconomic History   Marital status: Married    Spouse name: Not on file   Number of children: Not on file   Years of education: Not on file   Highest education level: Not on file  Occupational History   Not on file  Social Needs   Financial resource strain: Not on file   Food insecurity    Worry: Not on file    Inability: Not on file   Transportation needs    Medical: Not on file    Non-medical: Not on file  Tobacco Use   Smoking status: Former Smoker   Smokeless tobacco: Never Used   Tobacco comment: quit around 2010  Substance and Sexual Activity   Alcohol use: Yes    Alcohol/week: 0.0 standard drinks    Comment: occasionally   Drug use: No    Comment: remote use of marijuana, quit a long time ago   Sexual activity: Not on file  Lifestyle   Physical activity    Days per week: Not on file    Minutes per session: Not on file   Stress: Not on file  Relationships   Social connections    Talks on phone: Not on file    Gets together: Not on file    Attends religious service: Not on file    Active  member of club or organization: Not on file    Attends meetings of clubs or organizations: Not on file    Relationship status: Not on file  Other Topics Concern   Not on file  Social History Narrative   [employer] for [employer]   Divorced    No children      Former smoker    Alcohol Use - yes           Family History: The patient's family history includes Heart attack in his father and maternal grandmother; Hyperlipidemia in his unknown relative; Hypertension in his unknown relative; Stroke in his paternal grandfather.  ROS:  All other ROS reviewed and negative. Pertinent positives noted in the HPI.     EKGs/Labs/Other Studies Reviewed:   The following studies were personally reviewed by me today:  TTE: 04/13/2018: EF 45% with apical akinesis, no LV thrombus. 11/10/2017: EF 4045%, with mural apical thrombus similar apical akinesis.  Left Heart Cath:  11/09/2017 The LAD stent is widely patent with the exception of mild residual narrowing of 20% at the site of recent in-stent restenosis was treated with Cutting Balloon in July 2019.  The diagonal branch which arises from this segment has both antegrade filling and extensive retrograde collateralization being filled from the apical LAD. The left circumflex marginal vessel has 30 and 35% proximal to mid stenoses. RCA is a large dominant vessel with 20% smooth mid narrowing. Mild to moderate LV dysfunction with an ejection fraction of 40 to 45% with apical akinesis and distal anterolateral hypocontractility.  The apical contour raises concern for possible apical thrombus.  EKG:  EKG is ordered today.  The ekg ordered today demonstrates normal sinus rhythm, old anterior septal infarct, and was personally reviewed by me.   Recent Labs: 11/09/2017: B Natriuretic Peptide 14.6 11/10/2017: Hemoglobin 13.6; Platelets 171 02/07/2018: ALT 21; BUN 15; Creatinine, Ser 0.82; NT-Pro BNP 46; Potassium 3.8; Sodium 144  Recent Lipid Panel      Component Value Date/Time   CHOL 94 11/09/2017 0356   CHOL 80 (L) 02/15/2017 1030   TRIG 136 11/09/2017 0356   HDL 27 (L) 11/09/2017 0356   HDL 30 (L) 02/15/2017 1030   CHOLHDL 3.5 11/09/2017 0356   VLDL 27 11/09/2017 0356   LDLCALC 40 11/09/2017 0356   LDLCALC 29 02/15/2017 1030   LDLDIRECT 99.9 09/15/2012 1738    Physical Exam:   VS:  BP 95/70    Pulse 73    Ht 6' (1.829 m)    Wt 195 lb 12.8 oz (88.8 kg)    SpO2 95%    BMI 26.56 kg/m     Wt Readings from Last 3 Encounters:  09/20/18 195 lb 12.8 oz (88.8 kg)  01/09/18 214 lb 12.8 oz (97.4 kg)  11/28/17 211 lb 6.4 oz (95.9 kg)    General: Well nourished, well developed, in no acute distress Heart: Atraumatic, normal size  Eyes: PEERLA, EOMI  Neck: Supple, no JVD Endocrine: No thryomegaly Cardiac: Normal S1, S2; RRR; no murmurs, rubs, or gallops Lungs: Clear to auscultation bilaterally, no wheezing, rhonchi or rales  Abd: Soft, nontender, no hepatomegaly  Ext: No edema, pulses 2+ Musculoskeletal: No deformities, BUE and BLE strength normal and equal Skin: Warm and dry, no rashes   Neuro: Alert and oriented to person, place, time, and situation, CNII-XII grossly intact, no focal deficits  Psych: Normal mood and affect   ASSESSMENT:   NAME@ is a 53 y.o. male who presents for the following: 1. Preop cardiovascular exam   2. Chronic combined systolic and diastolic heart failure ([place])   3. Ischemic cardiomyopathy   4. Essential hypertension   5. CAD S/P percutaneous coronary angioplasty   6. LV (left ventricular) mural thrombus following MI ([place])     PLAN:   1.  Preoperative assessment -He has plans to proceed with routine colonoscopy in the next few weeks and I instructed him that it is okay to stop his Plavix and Eliquis 2 days prior to the procedure and then resume after the procedure at the discretion of gastroenterology. -He has a history of LV apical thrombus and was placed on Eliquis,  however subsequent  echocardiographic examination demonstrates no LV thrombus. -It will be imperative for him to resume his Eliquis at the completion of his colonoscopy, and he likely will continue this lifelong.  2.  Chest pain, atypical -Atypical chest pain described as dull and achy over the precordium that occurs at rest last minutes to hours, without alleviating or aggravating factors identified -He has not tried nitroglycerin I struck him to do this if he has this pain again to determine if this helps -The pain coincides with an exacerbation of his GERD, and this may be the culprit -He will attempt to use nitroglycerin at this pain occurs again and will present to the emergency room if this pain becomes concerning on his part, becomes more intense in quality, or does not resolve -Furthermore he also reports depression and blue mood with the COVID pandemic as he is working from home and hopefully this will improve in the coming months -Given his recent cardiac catheterization in October 2019, and atypical nature of the pain describes, I see very little value in repeating a stress test, or cardiac catheterization at this time.   3. Chronic Systolic Heart Failure, EF 45%, NYHA Class II, ACC/AHA Stage C -Blood pressure is a bit soft due to Entresto he reports diarrhea with starting this medication, he also reports orthostasis with increased dose of Entresto -We will plan to cut his Entresto pill in half rather than the full tablet -Continue Coreg as prescribed -No need for Lasix -No indication for ICD  4.  LV apical thrombus -Most recent echocardiogram demonstrates no LV apical thrombus, however the prior study definitely demonstrates mural thrombus on my review -He will hold his Eliquis prior to his colonoscopy, but he will resume this after his colonoscopy and likely lifelong given residual akinesis and no improvement in wall motion  Disposition: Return in about 3 months (around 12/21/2018) for Dr.  Claiborne Billings.  Medication Adjustments/Labs and Tests Ordered: Current medicines are reviewed at length with the patient today.  Concerns regarding medicines are outlined above.  Orders Placed This Encounter  Procedures   EKG 12-Lead   Meds ordered this encounter  Medications   sacubitril-valsartan (ENTRESTO) 49-51 MG    Sig: Take 1 tablet by mouth 2 (two) times daily.    Dispense:  60 tablet    Refill:  11    Patient Instructions  Medication Instructions:  Decrease Entresto to 49-51 mg two times a day  Ok to hold Eliquis and Plavix 2 days prior to surgery   If you need a refill on your cardiac medications before your next appointment, please call your pharmacy.   Lab work: None  Testing/Procedures: None  Follow-Up: At [facility], you and your health needs are our priority.  As part of our continuing mission to provide you with exceptional heart care, we have created designated Provider Care Teams.  These Care Teams include your primary Cardiologist (physician) and Advanced Practice Providers (APPs -  Physician Assistants and Nurse Practitioners) who all work together to provide you with the care you need, when you need it. You will need a follow up appointment in 3 months.  Please call our office 2 months in advance to schedule this appointment.  You may see [name], MD or one of the following Advanced Practice Providers on your designated Care Team: Almyra Deforest, PA-C  Fabian Sharp, PA-C      Signed, Addison Naegeli. Audie Box, [facility]  [address]  [place], [place] [address] [phone]  09/20/2018 3:34 PM

## 2018-09-20 NOTE — Telephone Encounter (Signed)
Pt was seen in office today by Dr. Marisue Ivan for pre-op evaluations. Office notes e-faxed to Dr. Estell Harpin office.

## 2018-09-20 NOTE — Patient Instructions (Signed)
Medication Instructions:  Decrease Entresto to 49-51 mg two times a day  Ok to hold Eliquis and Plavix 2 days prior to surgery   If you need a refill on your cardiac medications before your next appointment, please call your pharmacy.   Lab work: None  Testing/Procedures: None  Follow-Up: At [facility], you and your health needs are our priority.  As part of our continuing mission to provide you with exceptional heart care, we have created designated Provider Care Teams.  These Care Teams include your primary Cardiologist (physician) and Advanced Practice Providers (APPs -  Physician Assistants and Nurse Practitioners) who all work together to provide you with the care you need, when you need it. You will need a follow up appointment in 3 months.  Please call our office 2 months in advance to schedule this appointment.  You may see [name], MD or one of the following Advanced Practice Providers on your designated Care Team: [place], [place] . Fabian Sharp, PA-C

## 2018-10-09 ENCOUNTER — Other Ambulatory Visit: Payer: Self-pay

## 2018-10-09 MED ORDER — CARVEDILOL 25 MG PO TABS: 25.0000 mg | ORAL_TABLET | Freq: Two times a day (BID) | ORAL | 6 refills | Status: DC

## 2018-10-30 DIAGNOSIS — R7989 Other specified abnormal findings of blood chemistry: Secondary | ICD-10-CM | POA: Diagnosis not present

## 2018-10-30 DIAGNOSIS — Z8547 Personal history of malignant neoplasm of testis: Secondary | ICD-10-CM | POA: Diagnosis not present

## 2019-01-03 ENCOUNTER — Other Ambulatory Visit: Payer: Self-pay

## 2019-01-03 ENCOUNTER — Encounter: Payer: Self-pay | Admitting: Cardiovascular Disease

## 2019-01-03 ENCOUNTER — Ambulatory Visit: Payer: BC Managed Care – PPO | Admitting: Cardiovascular Disease

## 2019-01-03 DIAGNOSIS — I2109 ST elevation (STEMI) myocardial infarction involving other coronary artery of anterior wall: Secondary | ICD-10-CM

## 2019-01-03 DIAGNOSIS — I2510 Atherosclerotic heart disease of native coronary artery without angina pectoris: Secondary | ICD-10-CM | POA: Diagnosis not present

## 2019-01-03 DIAGNOSIS — E785 Hyperlipidemia, unspecified: Secondary | ICD-10-CM | POA: Diagnosis not present

## 2019-01-03 DIAGNOSIS — I255 Ischemic cardiomyopathy: Secondary | ICD-10-CM

## 2019-01-03 DIAGNOSIS — I513 Intracardiac thrombosis, not elsewhere classified: Secondary | ICD-10-CM

## 2019-01-03 DIAGNOSIS — Z7901 Long term (current) use of anticoagulants: Secondary | ICD-10-CM

## 2019-01-03 DIAGNOSIS — Z79899 Other long term (current) drug therapy: Secondary | ICD-10-CM

## 2019-01-03 MED ORDER — ENTRESTO 49-51 MG PO TABS: 1.0000 | ORAL_TABLET | Freq: Two times a day (BID) | ORAL | 11 refills | Status: DC

## 2019-01-03 MED ORDER — ASPIRIN EC 81 MG PO TBEC: 81.0000 mg | DELAYED_RELEASE_TABLET | Freq: Every day | ORAL | 3 refills | Status: AC

## 2019-01-03 NOTE — Patient Instructions (Signed)
Medication Instructions:  Your physician has recommended you make the following change in your medication:   DECREASE YOUR ENTRESTO TO 49/51 MG BY MOUTH TWICE A DAY  STOP TAKING PLAVIX  START ASPIRIN 81 MG BY MOUTH DAILY *If you need a refill on your cardiac medications before your next appointment, please call your pharmacy*  Lab Work: Your physician recommends that you return for lab work NEXT WEEK: [place]    If you have labs (blood work) drawn today and your tests are completely normal, you will receive your results only by: [name] MyChart Message (if you have MyChart) OR . A paper copy in the mail If you have any lab test that is abnormal or we need to change your treatment, we will call you to review the results.  Testing/Procedures: Your physician has requested that you have an echocardiogram. Echocardiography is a painless test that uses sound waves to create images of your heart. It provides your doctor with information about the size and shape of your heart and how well your heart's chambers and valves are working. This procedure takes approximately one hour. There are no restrictions for this procedure. LOCATION: [facility]: [address], [place], [place] [address] DUE MARCH 2021  Follow-Up: At [facility], you and your health needs are our priority.  As part of our continuing mission to provide you with exceptional heart care, we have created designated Provider Care Teams.  These Care Teams include your primary Cardiologist (physician) and Advanced Practice Providers (APPs -  Physician Assistants and Nurse Practitioners) who all work together to provide you with the care you need, when you need it.  Your next appointment:   MARCH 2021 (AFTER YOUR ECHOCARDIOGRAM)  The format for your next appointment:   In Person  Provider:    [name], MD

## 2019-01-03 NOTE — Progress Notes (Signed)
Patient ID: Cesar Woods, male   DOB: 06-04-1965, 53 y.o.   MRN: [id number]    Primary MD: [name]  HPI: Cesar Woods is a 53 y.o. male who presents to the office today for an 18fllow-up cardiology evaluation.  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  In December, he developed substernal chest discomfort which he initially attributed to GERD.  This continued to recur over that day.  The following day he went to work and continued to experience vague discomfort.  He was seen by his primary physician, Dr. SScarlette Arin his ECG was abnormal.  Changes of an anterolateral MI.  He presented to the emergency room.  He was not having any chest pain or shortness of breath or palpitations.  He underwent cardiac catheterization the following morning on 01/15/2015 which revealed significant CAD with 90% ostial LAD stenosis followed by 99% proximal LAD stenosis in the region of the first septal perforating artery with significant thrombus.  There was diffuse 50% stenosis followed by 95% ulcerated plaque in the proximal to mid LAD.  There also appeared to be an apical LAD dissection.  40% ramus intermediate, like high marginal stenosis and a large dominant RCA with 30% mid stenosis.  Large PDA and PLA vessels with septal collateralization to the proximal LAD.  He was felt to have an out of hospital late presentation anterolateral infarction secondary to his multiple high-grade stenoses.  He underwent a very difficult but successful intervention to his diffusely diseased LAD.  There was evidence for a spiral dissection in the mid distal LAD but ultimately was insertion of 3 tandem synergy DES stents (0.538, 3.038, and 3.020 mm from the mid LAD to the ostium and PTCA of the distal LAD with low-level inflation tach up his apical initial dissection.  There was total occlusion of the diagonal vessel  antegrade but there was significant development of retrograde collaterals to this diagonal vessel from the distal LAD.  Subsequent, he stabilized well.  He was seen in follow-up by [name] 01/24/2015 and was doing well without chest pain or shortness of breath.  At that time his blood pressure was controlled.  He participated in the cardiac rehabilitation program.  He is back at work and has a stressful job in [employer], [employer]   When I saw him for follow-up evaluation in March 2017.  His ECG showed Q waves in V1 through V4 4 with persistent T-wave inversion anteriorly and anterolaterally.  I recommended that he discontinue metoprolol succinate and changed him to carvedilol.  He is now on carvedilol at 6.25 mg twice a day and is tolerating this well.  He continues to be on low-dose losartan 12.5 mg.  I scheduled him for follow-up echo Doppler study to reassess his LV function.  This was done on 05/30/2015.  Ejection fraction was now 40-45% and there was evidence for akinesis of the apical myocardium.  There was grade 1 diastolic dysfunction.  When I saw him in May 2017 for follow-up evaluation, he denied recurrent chest pain, PND or orthopnea.  At time, I slowly titrated ARB therapy and increase losartan to 25 mg at bedtime.  So increased his carvedilol to 9.375 mg twice a day for 2 weeks and further titrated this to 12.5 mg daily.    He developed a recurrent episode of chest pain in October 2017 and was hospitalized.  A  nuclear perfusion study was performed which showed his previous large fixed defect involving the apex extending towards the mid ventricle in the anterior and lateral wall.  There is very minimal if any peri-infarct ischemia with an EF of 43%.  These were felt to be consistent with his prior MI.    Since I last saw him in March 2018 he had been evaluated in the emergency room with intermittent chest and back pain.  He had undergone a stress Myoview study in January 2019  which showed an EF of 40% with a large defect consistent with his prior LAD infarction and there was no significant ischemia except mild peri-infarction ischemia.  In July 2019 he developed recurrent symptomatology and when I was on vacation and underwent repeat catheterization by Dr. Ellyn Hack.  He was found to have 85% narrowing in the midportion of his long LAD stent in the region of the diagonal takeoff.  The diagonal vessel was occluded but had excellent collaterals.  He was rehospitalized in October 2019 with recurrent chest pain.  At that time on November 09, 2017 I performed repeat cardiac catheterization.  This revealed a widely patent LAD stent with mild residual narrowing of 20% at the site of recent in-stent restenosis that was treated with Cutting Balloon in July 2019.  The diagonal branch which arose from this segment has both antegrade filling and extensive retrograde collateralization being filled from the apical LAD.  The left circumflex marginal vessel had 30 and 35% proximal to mid stenoses.  The RCA was a large dominant vessel with smooth 20% narrowing.  He had mild to moderate LV dysfunction and his ejection fraction was improved to 40 to 45% with apical kinesis and distal anterolateral hypocontractility.  His apical contour raised concern for possible apical thrombus.  An echo Doppler study was suspicious for a layer of mural thrombus apically which was different from July 2019.  As result he was started on apixaban.  I saw him for follow-up on November 28, 2017 at which time he continued to remain stable and has been without chest pain.  He is on Eliquis 5 mg, Plavix 75 mg, carvedilol 25 mg twice a day, atorvastatin 80 mg and has been on losartan 25 mg daily.  He denies chest tightness, baseline shortness of breath but had noted some mild shortness of breath with exertion.  During that evaluation, I discontinued losartan and initiated Entresto.  He took 24/25 mg twice daily for 2 weeks and  was then further titrated up to 49/51 mg twice a day.  He has continued to be on atorvastatin 80 mg with target LDL less than 70.  I last saw him in December 2019 at which time he was feeling improved with initiation of Entresto since initiating.  Over the past year, he has been titrated up to maximal dosing at 97/103 mg twice a day.  He has continued to take carvedilol 25 mg twice a day.  He has been on Eliquis 5 mg twice a day with Plavix and has been on Prilosec.  He was evaluated in August 2020 by Dr. Davina Poke prior to undergoing colonoscopy given instructions to hold his Plavix and Eliquis.  The past he was documented to have an apical mural thrombus and on his most recent echo in March 2020 EF was 40 to 45%.  There was septal and apical akinesis secondary to his prior MI.  There was no apical thrombus noted which had resolved from his previous go.  During that  evaluation blood pressure was low.  Over the past several months, Mr. Dikes has noticed some episodes of mild dizziness.  He denies any palpitations or awareness of arrhythmia.  He admits that he has experienced some depression secondary to COVID-19 pandemic.  He is unaware of palpitations.  He has not had recent fasting laboratory.  He presents for evaluation.  Past Medical History:  Diagnosis Date   ACS (acute coronary syndrome) ([place]) 11/2015   Atypical chest pain    Negative Myoview 2010   Coronary artery disease    a.s/p STEMI in 01/2015 requiring placement of 4 DES to LAD. b. 11/2015: NST showing apical scar with minimal peri-infarct ischemia.   Essential hypertension    Food allergy    Anaphylaxis with shell fish   GERD (gastroesophageal reflux disease)    History of migraines    Hyperlipidemia    Ischemic cardiomyopathy    a. Echo 05/2015: EF 40-45%, apical akineiss with Grade 1 DD.   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 Risa Grill; had bilateral orchiectomy and  XRT in past    Past Surgical History:  Procedure Laterality Date   CARDIAC CATHETERIZATION N/A 01/15/2015   Procedure: Left Heart Cath and Coronary Angiography;  Surgeon: Troy Sine, MD;  Location: [place] [facility];  Service: Cardiovascular;  Laterality: N/A;   CARDIAC CATHETERIZATION  01/15/2015   Procedure: Coronary Stent Intervention;  Surgeon: Troy Sine, MD;  Location: [place] [facility];  Service: Cardiovascular;;   CORONARY BALLOON ANGIOPLASTY N/A 08/17/2017   Procedure: CORONARY BALLOON ANGIOPLASTY;  Surgeon: Leonie Man, MD;  Location: [place] [facility];  Service: Cardiovascular;  Laterality: N/A;   LEFT HEART CATH AND CORONARY ANGIOGRAPHY N/A 08/17/2017   Procedure: LEFT HEART CATH AND CORONARY ANGIOGRAPHY;  Surgeon: Leonie Man, MD;  Location: [place] [facility];  Service: Cardiovascular;  Laterality: N/A;   LEFT HEART CATH AND CORONARY ANGIOGRAPHY N/A 11/09/2017   Procedure: LEFT HEART CATH AND CORONARY ANGIOGRAPHY;  Surgeon: Troy Sine, MD;  Location: [place] [facility];  Service: Cardiovascular;  Laterality: N/A;   ORCHIECTOMY Bilateral     Allergies  Allergen Reactions   Iodine Anaphylaxis    Shellfish allergy    Shellfish Allergy Anaphylaxis   Penicillins Other (See Comments)    Childhood allergy Has patient had a PCN reaction causing immediate rash, facial/tongue/throat swelling, SOB or lightheadedness with hypotension: Unknown Has patient had a PCN reaction causing severe rash involving mucus membranes or skin necrosis: Unknown Has patient had a PCN reaction that required hospitalization: No Has patient had a PCN reaction occurring within the last 10 years: No If all of the above answers are "NO", then may proceed with Cephalosporin use.     Ace Inhibitors Cough    Current Outpatient Medications  Medication Sig Dispense Refill   apixaban (ELIQUIS) 5 MG TABS tablet Take 1 tablet (5 mg total) by mouth 2 (two) times daily. 180 tablet  3   atorvastatin (LIPITOR) 80 MG tablet TAKE 1 TABLET BY MOUTH DAILY AT 6 PM 90 tablet 1   carvedilol (COREG) 25 MG tablet Take 1 tablet (25 mg total) by mouth 2 (two) times daily with a meal. 60 tablet 6   EPIPEN 2-PAK 0.3 MG/0.3ML SOAJ injection INJECT 0.3 MLS INTO THE MUSCLE ONCE **PATIENT NEEDS TO SCHEDULE AN OFFICE VISIT** (Patient taking differently: Inject 0.3 mg into the muscle daily as needed (allergic  reaction). ) 2 Device 0   nitroGLYCERIN (NITROSTAT) 0.4 MG SL tablet PLACE 1 TABLET BY MOUTH UNDER THE TONGUE EVERY 5 MINUTES FOR 3 DOSES AS NEEDED FOR CHEST PAIN 25 tablet 2   omeprazole (PRILOSEC) 20 MG capsule Take 20 mg by mouth daily.     sacubitril-valsartan (ENTRESTO) 49-51 MG Take 1 tablet by mouth 2 (two) times daily. 60 tablet 11   testosterone (ANDROGEL) 50 MG/5GM (1%) GEL Place 5 g onto the skin daily.     aspirin EC 81 MG tablet Take 1 tablet (81 mg total) by mouth daily. 90 tablet 3   No current facility-administered medications for this visit.     Social History   Socioeconomic History   Marital status: Married    Spouse name: Not on file   Number of children: Not on file   Years of education: Not on file   Highest education level: Not on file  Occupational History   Not on file  Social Needs   Financial resource strain: Not on file   Food insecurity    Worry: Not on file    Inability: Not on file   Transportation needs    Medical: Not on file    Non-medical: Not on file  Tobacco Use   Smoking status: Former Smoker   Smokeless tobacco: Never Used   Tobacco comment: quit around 2010  Substance and Sexual Activity   Alcohol use: Yes    Alcohol/week: 0.0 standard drinks    Comment: occasionally   Drug use: No    Comment: remote use of marijuana, quit a long time ago   Sexual activity: Not on file  Lifestyle   Physical activity    Days per week: Not on file    Minutes per session: Not on file   Stress: Not on file  Relationships    Social connections    Talks on phone: Not on file    Gets together: Not on file    Attends religious service: Not on file    Active member of club or organization: Not on file    Attends meetings of clubs or organizations: Not on file    Relationship status: Not on file   Intimate partner violence    Fear of current or ex partner: Not on file    Emotionally abused: Not on file    Physically abused: Not on file    Forced sexual activity: Not on file  Other Topics Concern   Not on file  Social History Narrative   [place] for [employer]   Divorced    No children      Former smoker    Alcohol Use - yes         Additional social history is notable that he was born in [place], [place].  He lived for over a decade in [place].  He is married.  Former smoker.  Family History  Problem Relation Age of Onset   Heart attack Father        CABG at agae 40   Stroke Paternal Grandfather    Hypertension Unknown    Hyperlipidemia Unknown    Heart attack Maternal Grandmother     ROS General: Negative; No fevers, chills, or night sweats HEENT: Negative; No changes in vision or hearing, sinus congestion, difficulty swallowing Pulmonary: Negative; No cough, wheezing, shortness of breath, hemoptysis Cardiovascular: See HPI: No recurrent chest pain, presyncope, syncope, palpatations GI: Negative; No nausea, vomiting, diarrhea, or abdominal pain GU:  History of testicular cancer, status post orchiectomy. Musculoskeletal: Negative; no myalgias, joint pain, or weakness Hematologic: Negative; no easy bruising, bleeding Endocrine: Negative; no heat/cold intolerance; no diabetes, Neuro: Negative; no changes in balance, headaches Skin: Negative; No rashes or skin lesions Psychiatric: Negative; No behavioral problems, depression Sleep: Negative; No snoring,  daytime sleepiness, hypersomnolence, bruxism, restless legs, hypnogognic hallucinations. Other comprehensive 14  point system review is negative   Physical Exam BP 92/69    Pulse 85    Temp (!) 96.1 F (35.6 C)    Ht 6' (1.829 m)    Wt 195 lb 9.6 oz (88.7 kg)    SpO2 95%    BMI 26.53 kg/m    Repeat blood pressure by me was 100/66 supine and 90/60 standing  Wt Readings from Last 3 Encounters:  01/03/19 195 lb 9.6 oz (88.7 kg)  09/20/18 195 lb 12.8 oz (88.8 kg)  01/09/18 214 lb 12.8 oz (97.4 kg)   General: Alert, oriented, no distress.  Skin: normal turgor, no rashes, warm and dry HEENT: Normocephalic, atraumatic. Pupils equal round and reactive to light; sclera anicteric; extraocular muscles intact;  Nose without nasal septal hypertrophy Mouth/Parynx benign; Mallinpatti scale 3 Neck: No JVD, no carotid bruits; normal carotid upstroke Lungs: clear to ausculatation and percussion; no wheezing or rales Chest wall: without tenderness to palpitation Heart: PMI not displaced, RRR, s1 s2 normal, 1/6 systolic murmur, no diastolic murmur, no rubs, gallops, thrills, or heaves Abdomen: soft, nontender; no hepatosplenomehaly, BS+; abdominal aorta nontender and not dilated by palpation. Back: no CVA tenderness Pulses 2+ Musculoskeletal: full range of motion, normal strength, no joint deformities Extremities: no clubbing cyanosis or edema, Homan's sign negative  Neurologic: grossly nonfocal; Cranial nerves grossly wnl Psychologic: Normal mood and affect   ECG (independently read by me): Normal sinus rhythm at 78 bpm.  QS complex V1 through V4 consistent with prior anterior MI.  No ectopy.  QTc interval 389 ms.  Parable '1 7 8 ' ms  December 2019 ECG (independently read by me): Normal sinus rhythm at 75 bpm.  QS complex V1 through V4 consistent with old anterior MI.  Normal intervals.  No ectopy.  November 28, 2017 ECG (independently read by me): Normal sinus rhythm at 83 bpm.  QS complex V1 through V5 consistent with old anterior MI.  Normal intervals.  No ectopy.  March 2019 ECG (independently read by me):  Normal sinus rhythm at 79 bpm.  Old anterolateral infarct changes.  QRS complex V1 through V5; normal intervals.  No significant ST changes.  January 2018 ECG (independently read by me): Normal sinus rhythm at 92 bpm.  Anterior Q waves consistent with his prior anterolateral MI.  September 2017 ECG (independently read by me): Sinus rhythm at 83 bpm.  QRS complex V1-V6  concordant with his large anterolateral MI.  May 2017 ECG (independently read by me): Normal sinus rhythm at 79 bpm.  T-wave inversion V2 through V6 and 1 and aVL concordant with his prior anterior to anterolateral MI.  March 2017 ECG (independently read by me): NSR at 80; QS V1-4 with T wave inversion V1-V6, I and aVL  LABS:  BMP Latest Ref Rng & Units 02/07/2018 11/10/2017 11/09/2017  Glucose 65 - 99 mg/dL 111(H) 118(H) 87  BUN 6 - 24 mg/dL '15 14 14  ' Creatinine 0.76 - 1.27 mg/dL 0.82 0.89 0.88  BUN/Creat Ratio 9 - 20 18 - -  Sodium 134 - 144 mmol/L 144 138 140  Potassium 3.5 - 5.2 mmol/L  3.8 3.5 3.6  Chloride 96 - 106 mmol/L 107(H) 106 107  CO2 20 - 29 mmol/L '21 25 24  ' Calcium 8.7 - 10.2 mg/dL 8.4(L) 8.2(L) 8.6(L)     Hepatic Function Latest Ref Rng & Units 02/07/2018 02/15/2017 04/16/2015  Total Protein 6.0 - 8.5 g/dL 6.3 7.0 6.7  Albumin 3.5 - 5.5 g/dL 4.3 4.7 4.3  AST 0 - 40 IU/L '15 17 15  ' ALT 0 - 44 IU/L '21 19 14  ' Alk Phosphatase 39 - 117 IU/L 97 92 96  Total Bilirubin 0.0 - 1.2 mg/dL 1.3(H) 2.3(H) 1.5(H)  Bilirubin, Direct 0.00 - 0.40 mg/dL - 0.15 -    CBC Latest Ref Rng & Units 11/10/2017 11/09/2017 11/08/2017  WBC 4.0 - 10.5 K/uL 8.9 8.8 9.9  Hemoglobin 13.0 - 17.0 g/dL 13.6 14.7 14.5  Hematocrit 39.0 - 52.0 % 40.9 45.0 43.6  Platelets 150 - 400 K/uL 171 167 194   Lab Results  Component Value Date   MCV 90.1 11/10/2017   MCV 89.8 11/09/2017   MCV 90.6 11/08/2017    Lab Results  Component Value Date   TSH 0.401 (L) 08/10/2017    BNP    Component Value Date/Time   BNP 14.6 11/09/2017 0356   BNP  <2.0 06/26/2010 1334    ProBNP    Component Value Date/Time   PROBNP 46 02/07/2018 1216   PROBNP <2.0 pg/mL 10/17/2009 1834     Lipid Panel     Component Value Date/Time   CHOL 94 11/09/2017 0356   CHOL 80 (L) 02/15/2017 1030   TRIG 136 11/09/2017 0356   HDL 27 (L) 11/09/2017 0356   HDL 30 (L) 02/15/2017 1030   CHOLHDL 3.5 11/09/2017 0356   VLDL 27 11/09/2017 0356   LDLCALC 40 11/09/2017 0356   LDLCALC 29 02/15/2017 1030   LDLDIRECT 99.9 09/15/2012 1738     RADIOLOGY: No results found.  IMPRESSION:  1. ST elevation myocardial infarction (STEMI) of anterolateral wall (HCC) -- Delayed presentation, pain free on admission 01/14/2015   2. CAD in native artery   3. Ischemic cardiomyopathy   4. Hyperlipidemia LDL goal <70   5. Anticoagulation adequate   6. H/O Apical mural thrombus   7. Medication management     ASSESSMENT AND PLAN: Cesar Woods is a 53 year old white male who presented to [facility] on the evening of 01/14/2015 with late presentation following an anterolateral MI which most likely occurred the day prior to his presentation.  In the emergency room, he was pain-free. Cardiac catheterization revealed severe diffuse CAD with ulcerated plaque and thrombus and evidence for spontaneous distal LAD dissection.  He had a very difficult but successful PCI to his LAD, and ultimate insertion of 3 Synergy DES stents covering a total of 96 mm and extending from the ostium to the mid LAD.  He also had PTCA of his distal LAD dissection.  He has done remarkably well since that time and  participated in cardiac rehabilitation.  A echo Doppler showed an EF of 40-45% and apical akinesis.  He had done well without anginal symptomatology until July 2019 when he was found to have very focal 85% in-stent restenosis in the LAD stent at the region of the diagonal vessel with occlusion of the diagonal.  He had excellent collateralization to this diagonal vessel.  He underwent  successful cutting balloon.  At his most recent catheterization on November 09, 2017 his LAD stent was patent and there was now both antegrade and  extensive retrograde collateralization to the diagonal vessel.  He has been without recurrent anginal symptomatology.  At that time, his left ventriculogram suggested probable apical mural thrombus which on subsequent echo was very suggestive of an early forming thrombus.  As result he was anticoagulation with Eliquis.  His Brilinta was discontinued and he was switched to Plavix and is no longer on aspirin.  With his EF of 40 to 45%, I discontinued losartan and switched him to [place].  He has tolerated dose titration up to 49/51 mg twice a day.  His symptoms improved.  He was further titrated to maximum dosing and has been on 97/10 3,000,000 g twice a day.  When seen in August 2020 blood pressure was low at that time.  His blood pressure today remains low and on exam there was a 10 mm orthostatic drop from supine to standing with standing blood pressure 90/60.  His most recent echo Doppler study done in March showed resolution of his prior apical thrombus.  With his low blood pressure, I have recommended that he reduce Entresto back to 49/51,000,000 g twice a day.  Since he is now well over a year since his ACS and stenting I have recommended discontinuing of Plavix and resumption of 81 mg aspirin to take along with Eliquis 5 mg twice a day.  In March 2021 I am recommending a 1 year follow-up echo Doppler study to reassess LV function and apical thrombus.  I have recommended fasting laboratory be obtained over the next week on current therapy including 86 comprehensive metabolic panel, CBC, TSH, lipid studies, and proBNP.  I will see him in March 2021 in follow-up of his echo Doppler study.  Spent: 25 minutes Troy Sine, MD, [facility]  01/05/2019 4:52 PM

## 2019-01-05 ENCOUNTER — Encounter: Payer: Self-pay | Admitting: Cardiovascular Disease

## 2019-01-08 ENCOUNTER — Encounter (HOSPITAL_COMMUNITY): Payer: Self-pay | Admitting: Cardiovascular Disease

## 2019-01-09 ENCOUNTER — Emergency Department (HOSPITAL_COMMUNITY): Payer: BC Managed Care – PPO

## 2019-01-09 ENCOUNTER — Observation Stay (HOSPITAL_COMMUNITY)
Admission: EM | Admit: 2019-01-09 | Discharge: 2019-01-10 | Disposition: A | Discharge status: Home / Self Care | Payer: BC Managed Care – PPO | Attending: Cardiovascular Disease | Admitting: Cardiovascular Disease

## 2019-01-09 ENCOUNTER — Encounter (HOSPITAL_COMMUNITY): Payer: Self-pay | Admitting: Emergency Medicine

## 2019-01-09 ENCOUNTER — Other Ambulatory Visit: Payer: Self-pay | Admitting: Adult Health

## 2019-01-09 ENCOUNTER — Other Ambulatory Visit: Payer: Self-pay | Admitting: Cardiovascular Disease

## 2019-01-09 ENCOUNTER — Other Ambulatory Visit: Payer: Self-pay

## 2019-01-09 DIAGNOSIS — Z20828 Contact with and (suspected) exposure to other viral communicable diseases: Secondary | ICD-10-CM | POA: Insufficient documentation

## 2019-01-09 DIAGNOSIS — Z7902 Long term (current) use of antithrombotics/antiplatelets: Secondary | ICD-10-CM | POA: Insufficient documentation

## 2019-01-09 DIAGNOSIS — R079 Chest pain, unspecified: Secondary | ICD-10-CM | POA: Diagnosis not present

## 2019-01-09 DIAGNOSIS — Z7901 Long term (current) use of anticoagulants: Secondary | ICD-10-CM | POA: Diagnosis not present

## 2019-01-09 DIAGNOSIS — R0789 Other chest pain: Secondary | ICD-10-CM | POA: Diagnosis not present

## 2019-01-09 DIAGNOSIS — K219 Gastro-esophageal reflux disease without esophagitis: Secondary | ICD-10-CM | POA: Diagnosis not present

## 2019-01-09 DIAGNOSIS — Z8547 Personal history of malignant neoplasm of testis: Secondary | ICD-10-CM | POA: Diagnosis not present

## 2019-01-09 DIAGNOSIS — Z9079 Acquired absence of other genital organ(s): Secondary | ICD-10-CM | POA: Insufficient documentation

## 2019-01-09 DIAGNOSIS — I10 Essential (primary) hypertension: Secondary | ICD-10-CM | POA: Diagnosis not present

## 2019-01-09 DIAGNOSIS — Z79899 Other long term (current) drug therapy: Secondary | ICD-10-CM | POA: Diagnosis not present

## 2019-01-09 DIAGNOSIS — Z87891 Personal history of nicotine dependence: Secondary | ICD-10-CM | POA: Diagnosis not present

## 2019-01-09 DIAGNOSIS — I2510 Atherosclerotic heart disease of native coronary artery without angina pectoris: Secondary | ICD-10-CM | POA: Diagnosis not present

## 2019-01-09 DIAGNOSIS — I255 Ischemic cardiomyopathy: Secondary | ICD-10-CM

## 2019-01-09 DIAGNOSIS — I5042 Chronic combined systolic (congestive) and diastolic (congestive) heart failure: Secondary | ICD-10-CM | POA: Diagnosis not present

## 2019-01-09 DIAGNOSIS — Z7982 Long term (current) use of aspirin: Secondary | ICD-10-CM | POA: Insufficient documentation

## 2019-01-09 DIAGNOSIS — I252 Old myocardial infarction: Secondary | ICD-10-CM | POA: Insufficient documentation

## 2019-01-09 DIAGNOSIS — Z955 Presence of coronary angioplasty implant and graft: Secondary | ICD-10-CM | POA: Diagnosis not present

## 2019-01-09 DIAGNOSIS — R002 Palpitations: Secondary | ICD-10-CM | POA: Diagnosis not present

## 2019-01-09 DIAGNOSIS — E785 Hyperlipidemia, unspecified: Secondary | ICD-10-CM

## 2019-01-09 LAB — BASIC METABOLIC PANEL
Anion gap: 9 (ref 5–15)
BUN: 12 mg/dL (ref 6–20)
CO2: 27 mmol/L (ref 22–32)
Calcium: 8.9 mg/dL (ref 8.9–10.3)
Chloride: 105 mmol/L (ref 98–111)
Creatinine, Ser: 0.97 mg/dL (ref 0.61–1.24)
GFR calc Af Amer: >60 mL/min (ref >60)
GFR calc non Af Amer: >60 mL/min (ref >60)
Glucose, Bld: 125 mg/dL — ABNORMAL HIGH (ref 70–99)
Potassium: 3.8 mmol/L (ref 3.5–5.1)
Sodium: 141 mmol/L (ref 135–145)

## 2019-01-09 LAB — CBC
HCT: 42.7 % (ref 39.0–52.0)
Hemoglobin: 14.1 g/dL (ref 13.0–17.0)
MCH: 30.3 pg (ref 26.0–34.0)
MCHC: 33 g/dL (ref 30.0–36.0)
MCV: 91.8 fL (ref 80.0–100.0)
Platelets: 171 10*3/uL (ref 150–400)
RBC: 4.65 MIL/uL (ref 4.22–5.81)
RDW: 12.4 % (ref 11.5–15.5)
WBC: 6.7 10*3/uL (ref 4.0–10.5)
nRBC: 0 % (ref 0.0–0.2)

## 2019-01-09 LAB — TROPONIN I (HIGH SENSITIVITY)
Troponin I (High Sensitivity): 5 ng/L (ref <18)
Troponin I (High Sensitivity): 6 ng/L (ref <18)

## 2019-01-09 LAB — SARS CORONAVIRUS 2 (TAT 6-24 HRS): SARS Coronavirus 2: NEGATIVE

## 2019-01-09 MED ORDER — CARVEDILOL 25 MG PO TABS
25.0000 mg | ORAL_TABLET | Freq: Two times a day (BID) | ORAL | Status: DC
  Administered 2019-01-09: 25 mg via ORAL
  Filled 2019-01-09 (×2): qty 1

## 2019-01-09 MED ORDER — PANTOPRAZOLE SODIUM 40 MG PO TBEC
40.0000 mg | DELAYED_RELEASE_TABLET | Freq: Every day | ORAL | Status: DC
  Administered 2019-01-09 – 2019-01-10 (×2): 40 mg via ORAL
  Filled 2019-01-09 (×2): qty 1

## 2019-01-09 MED ORDER — ATORVASTATIN CALCIUM 80 MG PO TABS
80.0000 mg | ORAL_TABLET | Freq: Every day | ORAL | Status: DC
  Administered 2019-01-09: 80 mg via ORAL
  Filled 2019-01-09: qty 1

## 2019-01-09 MED ORDER — NITROGLYCERIN 0.4 MG SL SUBL: 0.4000 mg | SUBLINGUAL_TABLET | SUBLINGUAL | Status: DC | PRN

## 2019-01-09 MED ORDER — ACETAMINOPHEN 325 MG PO TABS: 650.0000 mg | ORAL_TABLET | ORAL | Status: DC | PRN

## 2019-01-09 MED ORDER — SACUBITRIL-VALSARTAN 49-51 MG PO TABS
1.0000 | ORAL_TABLET | Freq: Two times a day (BID) | ORAL | Status: DC
  Administered 2019-01-09 – 2019-01-10 (×2): 1 via ORAL
  Filled 2019-01-09 (×4): qty 1

## 2019-01-09 MED ORDER — ASPIRIN EC 81 MG PO TBEC
81.0000 mg | DELAYED_RELEASE_TABLET | Freq: Every day | ORAL | Status: DC
  Administered 2019-01-10: 81 mg via ORAL
  Filled 2019-01-09: qty 1

## 2019-01-09 MED ORDER — ONDANSETRON HCL 4 MG/2ML IJ SOLN: 4.0000 mg | Freq: Four times a day (QID) | INTRAMUSCULAR | Status: DC | PRN

## 2019-01-09 NOTE — ED Triage Notes (Signed)
Patient brought in by wife due to last night having an elevated HR that continued throughout the night. Woke up from his sleep around 4am feeling anxious with minor chest pressure. Sates he has had intermittent chest pressure for a few weeks. Saw cardiologist last week.

## 2019-01-09 NOTE — ED Provider Notes (Signed)
  Patient handoff received from [facility], [place]. Plan: Cardiology to evaluate, disposition per cardiology recommendation.  Prior to me seeing the patient, cardiology evaluated the patient and decided to admit.  I had no further handling of this patient.  Vitals:   01/09/19 1230 01/09/19 1245 01/09/19 1626 01/09/19 1700  BP: 117/73 114/76  (!) 119/93  Pulse: 69 69  86  Resp: 20 19  20   Temp:      TempSrc:      SpO2: 98% 98% 98% 98%     Lorayne Bender, PA-C 01/09/19 1736    Drenda Freeze, MD 01/10/19 [phone]

## 2019-01-09 NOTE — ED Provider Notes (Signed)
[facility] EMERGENCY DEPARTMENT Provider Note   CSN: [id number] Arrival date & time: 01/09/19  [id number]     History   Chief Complaint Chief Complaint  Patient presents with  . Chest Pain    HPI ARVIN RAUB is a 53 y.o. male history CAD, stent, hypertension, hyperlipidemia, GERD, ischemic cardiomyopathy, testicular cancer in remission.  Chest pain and palpitations beginning 5 PM yesterday.  Patient reports central chest pressure occasionally radiating to the left side of his chest constant but waxing and waning since yesterday moderate in intensity no clear aggravating or alleviating factors.  Reports heart rate intermittently elevated since yesterday with his apple watch noting rates up to 130 bpm at times.  Chart review cardiologist visit with [name] 01/03/2019: History anterior lateral MI December 2016, successful PCI and stent placement to LAD.  Most recent catheterization November 09, 2017, LAD stent patent.  Ventriculogram suggested apical mural thrombus.  EF 40-45%.  Patient currently on Entresto, Eliquis, Plavix.  Cardiology plans to switch from Plavix to aspirin but patient has not yet made that switch reports he has 3 days left on Eliquis prescription.     HPI  Past Medical History:  Diagnosis Date  . ACS (acute coronary syndrome) ([place]) 11/2015  . Atypical chest pain    Negative Myoview 2010  . Coronary artery disease    a.s/p STEMI in 01/2015 requiring placement of 4 DES to LAD. b. 11/2015: NST showing apical scar with minimal peri-infarct ischemia.  . Essential hypertension   . Food allergy    Anaphylaxis with shell fish  . GERD (gastroesophageal reflux disease)   . History of migraines   . Hyperlipidemia   . Ischemic cardiomyopathy    a. Echo 05/2015: EF 40-45%, apical akineiss with Grade 1 DD.  [name] Myocardial infarction ([place]) 2016  . Testicle cancer ([place])    In remission - followed by Dr. Risa Grill; had bilateral orchiectomy and XRT in past     Patient Active Problem List   Diagnosis Date Noted  . Chest pain 11/09/2017  . HLD (hyperlipidemia) 11/09/2017  . Abnormal TSH 08/25/2017  . Progressive angina ([place]) 08/16/2017  . CAD S/P percutaneous coronary angioplasty 08/10/2017  . Ischemic cardiomyopathy 12/10/2015  . Essential hypertension 12/10/2015  . Hyperlipidemia LDL goal <70 06/25/2015  . Acute MI, anterolateral wall, subsequent episode of care ([place]) 04/20/2015  . Chronic combined systolic and diastolic heart failure ([place]) 01/16/2015  . History of ST elevation myocardial infarction (STEMI) 01/14/2015  . Skin lesion 04/07/2012  . GOITER, MULTINODULAR 03/09/2010  . THYROID NODULE, RIGHT 03/06/2010  . DYSPHAGIA UNSPECIFIED 12/22/2009  . History of testicular cancer 10/17/2009  . SHORTNESS OF BREATH 10/17/2009  . ALLERGY, FOOD 03/07/2009  . SKIN TAG 03/07/2009  . PALPITATIONS 08/21/2008  . ALLERGIC RHINITIS 08/12/2008  . COLONIC POLYPS, HX OF 08/12/2008  . Dyslipidemia 07/29/2008  . Hypertensive heart disease 07/29/2008  . GERD 07/29/2008  . CHEST PAIN, ATYPICAL 07/29/2008    Past Surgical History:  Procedure Laterality Date  . CARDIAC CATHETERIZATION N/A 01/15/2015   Procedure: Left Heart Cath and Coronary Angiography;  Surgeon: Troy Sine, MD;  Location: [facility];  Service: Cardiovascular;  Laterality: N/A;  . CARDIAC CATHETERIZATION  01/15/2015   Procedure: Coronary Stent Intervention;  Surgeon: Troy Sine, MD;  Location: [facility];  Service: Cardiovascular;;  . CORONARY BALLOON ANGIOPLASTY N/A 08/17/2017   Procedure: CORONARY BALLOON ANGIOPLASTY;  Surgeon: Leonie Man, MD;  Location: [facility]  [facility];  Service: Cardiovascular;  Laterality: N/A;  . LEFT HEART CATH AND CORONARY ANGIOGRAPHY N/A 08/17/2017   Procedure: LEFT HEART CATH AND CORONARY ANGIOGRAPHY;  Surgeon: Leonie Man, MD;  Location: [facility];  Service: Cardiovascular;  Laterality: N/A;  . LEFT HEART CATH  AND CORONARY ANGIOGRAPHY N/A 11/09/2017   Procedure: LEFT HEART CATH AND CORONARY ANGIOGRAPHY;  Surgeon: Troy Sine, MD;  Location: [facility];  Service: Cardiovascular;  Laterality: N/A;  . ORCHIECTOMY Bilateral         Home Medications    Prior to Admission medications   Medication Sig Start Date End Date Taking? Authorizing Provider  apixaban (ELIQUIS) 5 MG TABS tablet Take 1 tablet (5 mg total) by mouth 2 (two) times daily. 01/09/18   Troy Sine, MD  aspirin EC 81 MG tablet Take 1 tablet (81 mg total) by mouth daily. 01/03/19   Troy Sine, MD  atorvastatin (LIPITOR) 80 MG tablet TAKE 1 TABLET BY MOUTH DAILY AT 6 PM 07/06/18   Lendon Colonel, NP  carvedilol (COREG) 25 MG tablet Take 1 tablet (25 mg total) by mouth 2 (two) times daily with a meal. 10/09/18   Troy Sine, MD  EPIPEN 2-PAK 0.3 MG/0.3ML SOAJ injection INJECT 0.3 MLS INTO THE MUSCLE ONCE **PATIENT NEEDS TO SCHEDULE AN OFFICE VISIT** Patient taking differently: Inject 0.3 mg into the muscle daily as needed (allergic reaction).  06/12/13   Yoo, Doe-Hyun R, DO  nitroGLYCERIN (NITROSTAT) 0.4 MG SL tablet PLACE 1 TABLET BY MOUTH UNDER THE TONGUE EVERY 5 MINUTES FOR 3 DOSES AS NEEDED FOR CHEST PAIN 09/11/18   Troy Sine, MD  omeprazole (PRILOSEC) 20 MG capsule Take 20 mg by mouth daily.    [provider]  sacubitril-valsartan (ENTRESTO) 49-51 MG Take 1 tablet by mouth 2 (two) times daily. 01/03/19   Troy Sine, MD  testosterone (ANDROGEL) 50 MG/5GM (1%) GEL Place 5 g onto the skin daily.    [provider]    Family History Family History  Problem Relation Age of Onset  . Heart attack Father        CABG at agae 61  . Stroke Paternal Grandfather   . Hypertension Other   . Hyperlipidemia Other   . Heart attack Maternal Grandmother     Social History Social History   Tobacco Use  . Smoking status: Former Research scientist (life sciences)  . Smokeless tobacco: Never Used  . Tobacco comment: quit  around 2010  Substance Use Topics  . Alcohol use: Yes    Alcohol/week: 0.0 standard drinks    Comment: occasionally  . Drug use: No    Comment: remote use of marijuana, quit a long time ago     Allergies   Iodine, Shellfish allergy, Penicillins, and Ace inhibitors   Review of Systems Review of Systems Ten systems are reviewed and are negative for acute change except as noted in the HPI   Physical Exam Updated Vital Signs BP 114/76   Pulse 69   Temp 98.6 F (37 C) (Oral)   Resp 19   SpO2 98%   Physical Exam Constitutional:      General: He is not in acute distress.    Appearance: Normal appearance. He is well-developed. He is not ill-appearing or diaphoretic.  HENT:     Head: Normocephalic and atraumatic.     Right Ear: External ear normal.     Left Ear: External ear normal.  Nose: Nose normal.  Eyes:     General: Vision grossly intact. Gaze aligned appropriately.     Pupils: Pupils are equal, round, and reactive to light.  Neck:     Musculoskeletal: Normal range of motion.     Trachea: Trachea and phonation normal. No tracheal deviation.  Cardiovascular:     Rate and Rhythm: Normal rate and regular rhythm.     Pulses:          Dorsalis pedis pulses are 2+ on the right side and 2+ on the left side.     Heart sounds: Normal heart sounds.  Pulmonary:     Effort: Pulmonary effort is normal. No respiratory distress.     Breath sounds: Normal breath sounds.  Abdominal:     General: There is no distension.     Palpations: Abdomen is soft.     Tenderness: There is no abdominal tenderness. There is no guarding or rebound.  Musculoskeletal: Normal range of motion.     Right lower leg: He exhibits no tenderness. No edema.     Left lower leg: He exhibits no tenderness. No edema.  Skin:    General: Skin is warm and dry.  Neurological:     Mental Status: He is alert.     GCS: GCS eye subscore is 4. GCS verbal subscore is 5. GCS motor subscore is 6.     Comments:  Speech is clear and goal oriented, follows commands Major Cranial nerves without deficit, no facial droop Moves extremities without ataxia, coordination intact  Psychiatric:        Behavior: Behavior normal.      ED Treatments / Results  Labs (all labs ordered are listed, but only abnormal results are displayed) Labs Reviewed  BASIC METABOLIC PANEL - Abnormal; Notable for the following components:      Result Value   Glucose, Bld 125 (*)    All other components within normal limits  CBC  TROPONIN I (HIGH SENSITIVITY)  TROPONIN I (HIGH SENSITIVITY)    EKG EKG Interpretation  Date/Time:  Tuesday January 09 2019 09:31:44 EST Ventricular Rate:  83 PR Interval:  178 QRS Duration: 82 QT Interval:  350 QTC Calculation: 411 R Axis:   7 Text Interpretation: Normal sinus rhythm Anterolateral infarct , age undetermined Abnormal ECG no significant change since Oct 2019 Confirmed by Sherwood Gambler [phone]) on 01/09/2019 10:00:48 AM   Radiology Dg Chest 2 View  Result Date: 01/09/2019 CLINICAL DATA:  Chest pressure EXAM: CHEST - 2 VIEW COMPARISON:  November 08, 2017. FINDINGS: Lungs are clear. Heart size and pulmonary vascularity are normal. No adenopathy. There is an apparent coronary stent involving the left anterior descending coronary artery. No pneumothorax. No bone lesions. IMPRESSION: No edema or consolidation. Apparent left anterior descending coronary artery stent. Electronically Signed   By: Lowella Grip III M.D.   On: 01/09/2019 09:56    Procedures Procedures (including critical care time)  Medications Ordered in ED Medications - No data to display   Initial Impression / Assessment and Plan / ED Course  I have reviewed the triage vital signs and the nursing notes.  Pertinent labs & imaging results that were available during my care of the patient were reviewed by me and considered in my medical decision making (see chart for details).     EKG: Normal sinus rhythm  Anterolateral infarct , age undetermined Abnormal ECG no significant change since Oct 2019 Confirmed by Sherwood Gambler [phone]) on 01/09/2019 10:00:48 AM  CBC  within normal limit BMP glucose 125 otherwise within normal limits High-sensitivity troponin: 5 CXR:    IMPRESSION:  No edema or consolidation. Apparent left anterior descending  coronary artery stent.  - Consult placed to cardiology.  Patient resting comfortably no acute distress.  Delta-HS troponin: 6 - Patient seen today by cardiology, waiting on recommendations.  Patient remains comfortable here in the ER distress.  Care handoff given to Arlean Hopping, PA-C at shift change, plan of care is to await cardiology recommendations, reassess, final dispo per oncoming team.  Note: Portions of this report may have been transcribed using voice recognition software. Every effort was made to ensure accuracy; however, inadvertent computerized transcription errors may still be present. Final Clinical Impressions(s) / ED Diagnoses   Final diagnoses:  None    ED Discharge Orders    None       Gari Crown 01/09/19 1508    Sherwood Gambler, MD 01/15/19 [id number]

## 2019-01-09 NOTE — H&P (Signed)
Cardiology Admission History and Physical:   Patient ID: Cesar Woods MRN: [id number]; DOB: November 22, 1965   Admission date: 01/09/2019  Primary Care Provider: Delilah Shan, MD Primary Cardiologist: [name], MD  Primary Electrophysiologist:  None   Chief Complaint:  Chest pressure  Patient Profile:   Cesar Woods is a 53 y.o. 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   History of Present Illness:   Cesar Woods reports intermittent chest pain for the past 2 weeks. He describes the pain as a substernal ache which radiates to the left and into his left bicep. No exacerbating or alleviating factors. He put together an entertainment center yesterday which did not make the chest pain worse, however afterwards he noticed his HR was elevated to the 130s. He denies palpitations or fluttering but was aware that his heart was racing. His HR returned to normal with rest. He woke up this morning with continued aching in his chest prompting him to present to the ED for further evaluation.  He was last evaluated by cardiology at an outpatient visit with [name] 01/03/2019, at which time he had complaints of dizziness without associated palpitations or chest pain. He was recommended to stop plavix and resume aspirin 81 mg daily given he was 1 year out from PCI, and he was recommended to continue apixaban for management of his apical thrombus with plans to repeat an echo 04/2019. His last ischemic evaluation was a LHC 11/2017 which showed patent p-mLAD stent with 20% in-stent restenosis, 40% Ost 2nd diagonal and Ramus lesions, and mild Ost 1st marginal and mRCA stenosis. His last echo 04/2018 with EF 40-45%, septal and apical akinesis, mild LAE, and calcifications of aortic  and mitral valve without stenosis.   At the time of this evaluation he is still having mild aching in his chest. He has not tried any SL nitro with these episodes. He reports occasionally lightheadedness with sudden position changes and was recommended to decrease his entresto at his last visit with [name], however he has not done this yet. He has chronic orthopnea which is unchanged. No complaints of LE edema, DOE, PND, or syncope. No complaints of bleeding with eliquis.   ED course: intermittently mildly tachypneic, otherwise VSS. Labs notable for electrolytes wnl, Cr 0.97, CBC wnl, HsTrop 5. EKG with sinus rhythm with rate 83 bpm, Q waves in V2-3, no STE/D, no TWI. CXR without edema or consolidation. Cardiology asked to evaluate.   Heart Pathway Score:     Past Medical History:  Diagnosis Date  . ACS (acute coronary syndrome) ([place]) 11/2015  . Atypical chest pain    Negative Myoview 2010  . Coronary artery disease    a.s/p STEMI in 01/2015 requiring placement of 4 DES to LAD. b. 11/2015: NST showing apical scar with minimal peri-infarct ischemia.  . Essential hypertension   . Food allergy    Anaphylaxis with shell fish  . GERD (gastroesophageal reflux disease)   . History of migraines   . Hyperlipidemia   . Ischemic cardiomyopathy    a. Echo 05/2015: EF 40-45%, apical akineiss with Grade 1 DD.  [name] Myocardial infarction ([place]) 2016  . Testicle cancer ([place])    In remission - followed by Dr. Risa Grill; had bilateral orchiectomy and XRT in past  Past Surgical History:  Procedure Laterality Date  . CARDIAC CATHETERIZATION N/A 01/15/2015   Procedure: Left Heart Cath and Coronary Angiography;  Surgeon: Troy Sine, MD;  Location: [place] [facility];  Service: Cardiovascular;  Laterality: N/A;  . CARDIAC CATHETERIZATION  01/15/2015   Procedure: Coronary Stent Intervention;  Surgeon: Troy Sine, MD;  Location: [place] [facility];  Service: Cardiovascular;;  . CORONARY BALLOON  ANGIOPLASTY N/A 08/17/2017   Procedure: CORONARY BALLOON ANGIOPLASTY;  Surgeon: Leonie Man, MD;  Location: [place] [facility];  Service: Cardiovascular;  Laterality: N/A;  . LEFT HEART CATH AND CORONARY ANGIOGRAPHY N/A 08/17/2017   Procedure: LEFT HEART CATH AND CORONARY ANGIOGRAPHY;  Surgeon: Leonie Man, MD;  Location: [place] [facility];  Service: Cardiovascular;  Laterality: N/A;  . LEFT HEART CATH AND CORONARY ANGIOGRAPHY N/A 11/09/2017   Procedure: LEFT HEART CATH AND CORONARY ANGIOGRAPHY;  Surgeon: Troy Sine, MD;  Location: [place] [facility];  Service: Cardiovascular;  Laterality: N/A;  . ORCHIECTOMY Bilateral      Medications Prior to Admission: Prior to Admission medications   Medication Sig Start Date End Date Taking? Authorizing Provider  apixaban (ELIQUIS) 5 MG TABS tablet Take 1 tablet (5 mg total) by mouth 2 (two) times daily. 01/09/18   Troy Sine, MD  aspirin EC 81 MG tablet Take 1 tablet (81 mg total) by mouth daily. 01/03/19   Troy Sine, MD  atorvastatin (LIPITOR) 80 MG tablet TAKE 1 TABLET BY MOUTH DAILY AT 6 PM 07/06/18   Lendon Colonel, NP  carvedilol (COREG) 25 MG tablet Take 1 tablet (25 mg total) by mouth 2 (two) times daily with a meal. 10/09/18   Troy Sine, MD  EPIPEN 2-PAK 0.3 MG/0.3ML SOAJ injection INJECT 0.3 MLS INTO THE MUSCLE ONCE **PATIENT NEEDS TO SCHEDULE AN OFFICE VISIT** Patient taking differently: Inject 0.3 mg into the muscle daily as needed (allergic reaction).  06/12/13   Yoo, Doe-Hyun R, DO  nitroGLYCERIN (NITROSTAT) 0.4 MG SL tablet PLACE 1 TABLET BY MOUTH UNDER THE TONGUE EVERY 5 MINUTES FOR 3 DOSES AS NEEDED FOR CHEST PAIN 09/11/18   Troy Sine, MD  omeprazole (PRILOSEC) 20 MG capsule Take 20 mg by mouth daily.    [provider]  sacubitril-valsartan (ENTRESTO) 49-51 MG Take 1 tablet by mouth 2 (two) times daily. 01/03/19   Troy Sine, MD  testosterone (ANDROGEL) 50 MG/5GM (1%) GEL Place 5 g onto the  skin daily.    [provider]     Allergies:    Allergies  Allergen Reactions  . Iodine Anaphylaxis    Shellfish allergy   . Shellfish Allergy Anaphylaxis  . Penicillins Other (See Comments)    Childhood allergy Has patient had a PCN reaction causing immediate rash, facial/tongue/throat swelling, SOB or lightheadedness with hypotension: Unknown Has patient had a PCN reaction causing severe rash involving mucus membranes or skin necrosis: Unknown Has patient had a PCN reaction that required hospitalization: No Has patient had a PCN reaction occurring within the last 10 years: No If all of the above answers are "NO", then may proceed with Cephalosporin use.    . Ace Inhibitors Cough    Social History:   Social History   Socioeconomic History  . Marital status: Married    Spouse name: Not on file  . Number of children: Not on file  . Years of education: Not on file  . Highest education level: Not on file  Occupational History  . Not on file  Social Needs  . Financial resource strain: Not on file  . Food insecurity    Worry: Not on file    Inability: Not on file  . Transportation needs    Medical: Not on file    Non-medical: Not on file  Tobacco Use  . Smoking status: Former Research scientist (life sciences)  . Smokeless tobacco: Never Used  . Tobacco comment: quit around 2010  Substance and Sexual Activity  . Alcohol use: Yes    Alcohol/week: 0.0 standard drinks    Comment: occasionally  . Drug use: No    Comment: remote use of marijuana, quit a long time ago  . Sexual activity: Not on file  Lifestyle  . Physical activity    Days per week: Not on file    Minutes per session: Not on file  . Stress: Not on file  Relationships  . Social Herbalist on phone: Not on file    Gets together: Not on file    Attends religious service: Not on file    Active member of club or organization: Not on file    Attends meetings of clubs or organizations: Not on file    Relationship  status: Not on file  . Intimate partner violence    Fear of current or ex partner: Not on file    Emotionally abused: Not on file    Physically abused: Not on file    Forced sexual activity: Not on file  Other Topics Concern  . Not on file  Social History Narrative   Occupation:Senior Trader for [employer]   Divorced    No children      Former smoker    Alcohol Use - yes          Family History:   The patient's family history includes Heart attack in his father and maternal grandmother; Hyperlipidemia in an other family member; Hypertension in an other family member; Stroke in his paternal grandfather.    ROS:  Please see the history of present illness.  All other ROS reviewed and negative.     Physical Exam/Data:   Vitals:   01/09/19 1100 01/09/19 1115 01/09/19 1145 01/09/19 1230  BP: 120/70 114/77 113/71 117/73  Pulse:  92 69 69  Resp: (!) 22 19 (!) 21 20  Temp:      TempSrc:      SpO2:  98% 98% 98%   No intake or output data in the 24 hours ending 01/09/19 1300 Last 3 Weights 01/03/2019 09/20/2018 01/09/2018  Weight (lbs) 195 lb 9.6 oz 195 lb 12.8 oz 214 lb 12.8 oz  Weight (kg) 88.724 kg 88.814 kg 97.433 kg     There is no height or weight on file to calculate BMI.  General:  Well nourished, well developed, sitting upright in bed in no acute distress HEENT: sclera anicteric Neck: no JVD Vascular: No carotid bruits; distal pulses 2+ bilaterally  Cardiac:  normal S1, S2; RRR; no murmurs, rubs, or gallops Lungs:  clear to auscultation bilaterally, no wheezing, rhonchi or rales  Abd: soft, nontender, no hepatomegaly  Ext: no edema Musculoskeletal:  No deformities, BUE and BLE strength normal and equal Skin: warm and dry  Neuro:  CNs 2-12 intact, no focal abnormalities noted Psych:  Normal affect    EKG:  sinus rhythm with rate 83 bpm, Q waves in V2-3, no STE/D; no significant change from previous.   Relevant CV Studies: Echocardiogram 04/2018: IMPRESSIONS  1.  The left ventricle has mild-moderately reduced systolic function, with an ejection fraction of 40-45%. The cavity size was mildly dilated. Left ventricular diastolic parameters were normal.  2. Septal and apical akinesis.  3. The right ventricle has normal systolic function. The cavity was normal. There is no increase in right ventricular wall thickness.  4. Left atrial size was mildly dilated.  5. The mitral valve is degenerative. Mild thickening of the mitral valve leaflet. Mild calcification of the mitral valve leaflet.  6. The tricuspid valve is normal in structure.  7. The aortic valve is tricuspid Mild thickening of the aortic valve Mild calcification of the aortic valve.  8. The pulmonic valve was normal in structure.  Left heart catheterization 11/2017:  Non-stenotic Prox LAD lesion was previously treated.  Non-stenotic Mid LAD lesion was previously treated.  Ost 2nd Diag lesion is 40% stenosed.  Ramus lesion is 40% stenosed.  Ost 1st Mrg-1 lesion is 30% stenosed.  Ost 1st Mrg-2 lesion is 35% stenosed.  Mid RCA lesion is 20% stenosed.  Prox LAD to Mid LAD lesion is 20% stenosed.  LV end diastolic pressure is mildly elevated.  There is mild to moderate left ventricular systolic dysfunction.   The LAD stent is widely patent with the exception of mild residual narrowing of 20% at the site of recent in-stent restenosis was treated with Cutting Balloon in July 2019.  The diagonal branch which arises from this segment has both antegrade filling and extensive retrograde collateralization being filled from the apical LAD.  The left circumflex marginal vessel has 30 and 35% proximal to mid stenoses.  RCA is a large dominant vessel with 20% smooth mid narrowing.  Mild to moderate LV dysfunction with an ejection fraction of 40 to 45% with apical akinesis and distal anterolateral hypocontractility.  The apical contour raises concern for possible apical thrombus.   RECOMMENDATION: Medical therapy for concomitant CAD and mild to moderate LV dysfunction high potency statin therapy.  Optimal blood pressure control with target BP less than 130/80.  Recommend repeat echo Doppler study to evaluate for possible apical thrombus.  Recommend dual antiplatelet therapy with Aspirin 81mg  daily and Ticagrelor 90mg  twice daily long-term (beyond 12 months) because of Extensive stents in his LAD system and concomitant CAD.  Laboratory Data:  High Sensitivity Troponin:   Recent Labs  Lab 01/09/19 0938  TROPONINIHS 5      Chemistry Recent Labs  Lab 01/09/19 0938  NA 141  K 3.8  CL 105  CO2 27  GLUCOSE 125*  BUN 12  CREATININE 0.97  CALCIUM 8.9  GFRNONAA >60  GFRAA >60  ANIONGAP 9    No results for input(s): PROT, ALBUMIN, AST, ALT, ALKPHOS, BILITOT in the last 168 hours. Hematology Recent Labs  Lab 01/09/19 0938  WBC 6.7  RBC 4.65  HGB 14.1  HCT 42.7  MCV 91.8  MCH 30.3  MCHC 33.0  RDW 12.4  PLT 171   BNPNo results for input(s): BNP, PROBNP in the last 168 hours.  DDimer No results for input(s): DDIMER in the last 168 hours.   Radiology/Studies:  Dg Chest 2 View  Result Date: 01/09/2019 CLINICAL DATA:  Chest pressure EXAM: CHEST - 2 VIEW COMPARISON:  November 08, 2017. FINDINGS: Lungs are clear. Heart size and pulmonary vascularity are normal. No adenopathy. There is an apparent coronary stent involving the left anterior descending coronary artery. No pneumothorax. No bone lesions. IMPRESSION: No edema or consolidation. Apparent left anterior descending coronary artery stent.  Electronically Signed   By: Lowella Grip III M.D.   On: 01/09/2019 09:56    Assessment and Plan:   1. Chest pain in patient with CAD s/p PCI/DES to LAD (3 overlapping stents) in 2016 with ISR in 2019 managed with balloon angioplasty: patient presented with somewhat atypical chest pain which is non-exertional and essentially constant in nature. EKG is non-ischemic  and unchanged from previous. HsTrop is negative x2. His last cath 11/2017 showed patent LAD stents, otherwise with mild non-obstructive disease. Echo 04/2018 with 40-45%, resolution of mural thrombus, septal and apical akinesis.  - Will plan for NST to further evaluate symptoms - will make NPO after MN.  - Continue aspirin, statin, and beta blocker  2. Ischemic cardiomyopathy: EF 40-45% 04/2018. No volume overload complaints and he appears euvolemic on exam.  - Continue entresto and carvedilol  3. HTN: BP well controlled - Continue entresto and carvedilol  4. HLD: LDL 40 11/2017. Ordered for repeat labs which have not been completed yet - Will check FLP in AM - Continue atorvastatin  5. Mural wall thrombus: noted on echo 11/2017. Appears to have resolved on echo 04/2018. Continued on apixaban given ongoing RWMA.  - Will hold apixaban for upcoming ischemic evaluation with plans to restart if NST is negative - Patient refused heparin gtt for short term anticoagulation - SCDs ordered   6. Tachycardia: patient reported HR in the 130s yesterday which seemed out of proportion to activity. No palpitations but aware of racing heart beat. Improved with rest. He used his cardia app and was reportedly in a normal rhythm - Will monitor for arrhythmia on telemetry    Severity of Illness: The appropriate patient status for this patient is OBSERVATION. Observation status is judged to be reasonable and necessary in order to provide the required intensity of service to ensure the patient's safety. The patient's presenting symptoms, physical exam findings, and initial radiographic and laboratory data in the context of their medical condition is felt to place them at decreased risk for further clinical deterioration. Furthermore, it is anticipated that the patient will be medically stable for discharge from the hospital within 2 midnights of admission. The following factors support the patient status of  observation.   " The patient's presenting symptoms include chest pain, tachycardia. " The physical exam findings include benign cardiopulmonary exam. " The initial radiographic and laboratory data are negative troponins and non-ischemic EKG.     For questions or updates, please contact [place] Please consult [email or web address] for contact info under        Signed, Abigail Butts, PA-C  01/09/2019 1:00 PM

## 2019-01-09 NOTE — ED Notes (Signed)
Attempted report 

## 2019-01-10 ENCOUNTER — Observation Stay (HOSPITAL_BASED_OUTPATIENT_CLINIC_OR_DEPARTMENT_OTHER): Payer: BC Managed Care – PPO

## 2019-01-10 DIAGNOSIS — R079 Chest pain, unspecified: Secondary | ICD-10-CM | POA: Diagnosis not present

## 2019-01-10 DIAGNOSIS — R0789 Other chest pain: Secondary | ICD-10-CM | POA: Diagnosis not present

## 2019-01-10 LAB — BASIC METABOLIC PANEL
Anion gap: 10 (ref 5–15)
BUN: 15 mg/dL (ref 6–20)
CO2: 28 mmol/L (ref 22–32)
Calcium: 8.4 mg/dL — ABNORMAL LOW (ref 8.9–10.3)
Chloride: 102 mmol/L (ref 98–111)
Creatinine, Ser: 0.97 mg/dL (ref 0.61–1.24)
GFR calc Af Amer: >60 mL/min (ref >60)
GFR calc non Af Amer: >60 mL/min (ref >60)
Glucose, Bld: 97 mg/dL (ref 70–99)
Potassium: 3.4 mmol/L — ABNORMAL LOW (ref 3.5–5.1)
Sodium: 140 mmol/L (ref 135–145)

## 2019-01-10 LAB — NM MYOCAR MULTI W/SPECT W/WALL MOTION / EF
Estimated workload: 1 METS
MPHR: 168 {beats}/min
Peak HR: 115 {beats}/min
Percent HR: 68 %
Rest HR: 72 {beats}/min

## 2019-01-10 MED ORDER — TECHNETIUM TC 99M TETROFOSMIN IV KIT
30.0000 | PACK | Freq: Once | INTRAVENOUS | Status: AC | PRN
  Administered 2019-01-10: 30 via INTRAVENOUS

## 2019-01-10 MED ORDER — REGADENOSON 0.4 MG/5ML IV SOLN
INTRAVENOUS | Status: AC
  Filled 2019-01-10: qty 5

## 2019-01-10 MED ORDER — TECHNETIUM TC 99M TETROFOSMIN IV KIT
10.0000 | PACK | Freq: Once | INTRAVENOUS | Status: AC | PRN
  Administered 2019-01-10: 10 via INTRAVENOUS

## 2019-01-10 MED ORDER — REGADENOSON 0.4 MG/5ML IV SOLN
0.4000 mg | Freq: Once | INTRAVENOUS | Status: AC
  Administered 2019-01-10: 0.4 mg via INTRAVENOUS

## 2019-01-10 NOTE — Progress Notes (Signed)
Progress Note  Patient Name: Cesar Woods Date of Encounter: 01/10/2019  Primary Cardiologist: [name], MD   Subjective   Feels well this am. Some minor chest discomfort last night.  Inpatient Medications    Scheduled Meds: . aspirin EC  81 mg Oral Daily  . atorvastatin  80 mg Oral q1800  . carvedilol  25 mg Oral BID WC  . pantoprazole  40 mg Oral Daily  . sacubitril-valsartan  1 tablet Oral BID   Continuous Infusions:  PRN Meds: acetaminophen, nitroGLYCERIN, ondansetron (ZOFRAN) IV   Vital Signs    Vitals:   01/09/19 1945 01/10/19 0014 01/10/19 0455 01/10/19 0722  BP: 112/75 93/61 97/69  107/77  Pulse: 92 79 83 76  Resp: 18 18 18 20   Temp: 98.4 F (36.9 C) 98.1 F (36.7 C) 98 F (36.7 C) 98.6 F (37 C)  TempSrc: Oral Oral Oral Oral  SpO2: 94% 96% 96% 96%  Weight:   86.1 kg   Height:        Intake/Output Summary (Last 24 hours) at 01/10/2019 0826 Last data filed at 01/10/2019 0600 Gross per 24 hour  Intake 240 ml  Output 300 ml  Net -60 ml   Last 3 Weights 01/10/2019 01/09/2019 01/03/2019  Weight (lbs) 189 lb 12.8 oz 188 lb 3.2 oz 195 lb 9.6 oz  Weight (kg) 86.093 kg 85.367 kg 88.724 kg      Telemetry    NSR- Personally Reviewed  ECG    NSR, old anterolateral infarct - Personally Reviewed  Physical Exam   GEN: No acute distress.   Neck: No JVD Cardiac: RRR, no murmurs, rubs, or gallops.  Respiratory: Clear to auscultation bilaterally. GI: Soft, nontender, non-distended  MS: No edema; No deformity. Neuro:  Nonfocal  Psych: Normal affect   Labs    High Sensitivity Troponin:   Recent Labs  Lab 01/09/19 0938 01/09/19 1302  TROPONINIHS 5 6      Chemistry Recent Labs  Lab 01/09/19 0938 01/10/19 0425  NA 141 140  K 3.8 3.4*  CL 105 102  CO2 27 28  GLUCOSE 125* 97  BUN 12 15  CREATININE 0.97 0.97  CALCIUM 8.9 8.4*  GFRNONAA >60 >60  GFRAA >60 >60  ANIONGAP 9 10     Hematology Recent Labs  Lab 01/09/19 0938  WBC  6.7  RBC 4.65  HGB 14.1  HCT 42.7  MCV 91.8  MCH 30.3  MCHC 33.0  RDW 12.4  PLT 171    BNPNo results for input(s): BNP, PROBNP in the last 168 hours.   DDimer No results for input(s): DDIMER in the last 168 hours.   Radiology    Dg Chest 2 View  Result Date: 01/09/2019 CLINICAL DATA:  Chest pressure EXAM: CHEST - 2 VIEW COMPARISON:  November 08, 2017. FINDINGS: Lungs are clear. Heart size and pulmonary vascularity are normal. No adenopathy. There is an apparent coronary stent involving the left anterior descending coronary artery. No pneumothorax. No bone lesions. IMPRESSION: No edema or consolidation. Apparent left anterior descending coronary artery stent. Electronically Signed   By: Lowella Grip III M.D.   On: 01/09/2019 09:56    Cardiac Studies   Echo 04/13/18: IMPRESSIONS    1. The left ventricle has mild-moderately reduced systolic function, with an ejection fraction of 40-45%. The cavity size was mildly dilated. Left ventricular diastolic parameters were normal.  2. Septal and apical akinesis.  3. The right ventricle has normal systolic function. The cavity was normal.  There is no increase in right ventricular wall thickness.  4. Left atrial size was mildly dilated.  5. The mitral valve is degenerative. Mild thickening of the mitral valve leaflet. Mild calcification of the mitral valve leaflet.  6. The tricuspid valve is normal in structure.  7. The aortic valve is tricuspid Mild thickening of the aortic valve Mild calcification of the aortic valve.  8. The pulmonic valve was normal in structure.  Patient Profile     53 y.o. 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   Assessment & Plan    1. Atypical Chest pain in patient with CAD s/p  PCI/DES to LAD (3 overlapping stents) in 2016 with ISR in 2019 managed with balloon angioplasty: patient presented with somewhat atypical chest pain which is non-exertional and essentially constant in nature. EKG is non-ischemic and unchanged from previous. HsTrop is negative x2. His last cath 11/2017 showed patent LAD stents, otherwise with mild non-obstructive disease. Echo 04/2018 with 40-45%, resolution of mural thrombus, septal and apical akinesis.  - Will plan for NST today. Anticipate study will be abnormal with old anterior infarct. Prior Nuclear studies show a fixed anterior and apical infarct. If study is unchanged from prior plan on DC today and follow up with Dr Claiborne Billings as outpatient. - Continue aspirin, statin, and beta blocker  2. Ischemic cardiomyopathy: EF 40-45% 04/2018. No volume overload complaints and he appears euvolemic on exam.  - Continue entresto and carvedilol - Entresto dose just reduced due to low BP. Patient states his first time taking the lower dose was last night. If BP remains low may need to alter medication further.   3. HTN: BP well controlled - Continue entresto and carvedilol  4. HLD: LDL 40 11/2017. Ordered for repeat labs which have not been completed yet - lipid panel is pending - Continue atorvastatin  5. Mural wall thrombus: noted on echo 11/2017. Appears to have resolved on echo 04/2018. Continued on apixaban given ongoing RWMA.  - Will hold apixaban for upcoming ischemic evaluation with plans to restart if NST is negative  6. Tachycardia: patient reported HR in the 130s yesterday which seemed out of proportion to activity. - no significant  arrhythmia on telemetry      For questions or updates, please contact [place] Please consult [email or web address] for contact info under        Signed,  [place], MD  01/10/2019, 8:26 AM

## 2019-01-10 NOTE — Discharge Summary (Signed)
Discharge Summary    Patient ID: Cesar Woods,  MRN: [id number], DOB/AGE: 1965/12/10 53 y.o.  Admit date: 01/09/2019 Discharge date: 01/10/2019  Primary Care Provider: Delilah Shan Primary Cardiologist: [name], MD  Discharge Diagnoses    Active Problems:   Chest pain with moderate risk of acute coronary syndrome   Allergies Allergies  Allergen Reactions   Iodine Anaphylaxis    Shellfish allergy    Shellfish Allergy Anaphylaxis   Penicillins Other (See Comments)    Childhood allergy Has patient had a PCN reaction causing immediate rash, facial/tongue/throat swelling, SOB or lightheadedness with hypotension: Unknown Has patient had a PCN reaction causing severe rash involving mucus membranes or skin necrosis: Unknown Has patient had a PCN reaction that required hospitalization: No Has patient had a PCN reaction occurring within the last 10 years: No If all of the above answers are "NO", then may proceed with Cephalosporin use.     Ace Inhibitors Cough    Diagnostic Studies/Procedures    Lexiscan myoview: 01/10/19  IMPRESSION: 1. Large apical infarct, similar to previous studies. No definite peri-infarct ischemia.  2. Apical hypokinesis.  No new wall motion abnormalities identified.  3. Left ventricular ejection fraction 48%. This is improved from the previous studies (40% and 43%).  4. Non invasive risk stratification*: Intermediate  _____________   History of Present Illness     53 y.o. 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   Cesar Woods reported intermittent chest pain for the past 2 weeks. He described the pain as a substernal ache which radiated to the left chest pain and into his left bicep. No exacerbating  or alleviating factors. 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 He denied palpitations or fluttering but was aware that his heart was racing. His HR returned to normal with rest. He woke up the morning of admission with continued aching in his chest prompting him to present to the ED for further evaluation.  He was last evaluated by cardiology at an outpatient visit with [name] 01/03/2019, at which time he had complaints of dizziness without associated palpitations or chest pain. He was recommended to stop plavix and resume aspirin 81 mg daily given he was 1 year out from PCI, and he was recommended to continue apixaban for management of his apical thrombus with plans to repeat an echo 04/2019. His last ischemic evaluation was a LHC 11/2017 which showed patent p-mLAD stent with 20% in-stent restenosis, 40% Ost 2nd diagonal and Ramus lesions, and mild Ost 1st marginal and mRCA stenosis. His last echo 04/2018 with EF 40-45%, septal and apical akinesis, mild LAE, and calcifications of aortic and mitral valve without stenosis.   At the time of evaluation in the ED he was still having mild aching in his chest. He had not tried any SL nitro with these episodes. He reported occasionally lightheadedness with sudden position changes and was recommended to decrease his entresto at his last visit with [name], however he had not done this yet. He has chronic orthopnea which was unchanged. No complaints of LE edema, DOE, PND, or syncope. No complaints of bleeding with eliquis.  ED course: intermittently mildly tachypneic, otherwise VSS. Labs notable for electrolytes wnl, Cr 0.97, CBC wnl, HsTrop 5. EKG with sinus rhythm with rate 83 bpm, Q waves in V2-3, no STE/D, no TWI. CXR without edema or consolidation. Cardiology asked to evaluate. He was admitted for further work up. Of note, he also reported  issues with hypotension and his Entresto dose was to be reduced. He had actually continued to take his original dose until just prior to admission.   Hospital Course     He reported feeling better the following morning. His blood pressures were stable with his reduced dose of Entresto of 49-51. Prior stress test have been abnormal with old anterior infarct. Underwent nuclear stress testing which showed large apical infarct similar to prior studies and no new ischemia. EF noted at 48%. He had no further chest pain since admission. He was instructed to resumed on home dose of Eliquis, along with lower dose of Entresto.   Cesar Woods was seen by Dr. Martinique and determined stable for discharge home. Follow up in the office has been arranged. Medications are listed below.   _____________  Discharge Vitals Blood pressure 102/67, pulse 77, temperature 98.2 F (36.8 C), temperature source Oral, resp. rate 20, height 6' (1.829 m), weight 86.1 kg, SpO2 99 %.  Filed Weights   01/09/19 1815 01/10/19 0455  Weight: 85.4 kg 86.1 kg    Labs & Radiologic Studies    CBC Recent Labs    01/09/19 0938  WBC 6.7  HGB 14.1  HCT 42.7  MCV 91.8  PLT [id number]   Basic Metabolic Panel Recent Labs    01/09/19 0938 01/10/19 0425  NA 141 140  K 3.8 3.4*  CL 105 102  CO2 27 28  GLUCOSE 125* 97  BUN 12 15  CREATININE 0.97 0.97  CALCIUM 8.9 8.4*   Liver Function Tests No results for input(s): AST, ALT, ALKPHOS, BILITOT, PROT, ALBUMIN in the last 72 hours. No results for input(s): LIPASE, AMYLASE in the last 72 hours. Cardiac Enzymes No results for input(s): CKTOTAL, CKMB, CKMBINDEX, TROPONINI in the last 72 hours. BNP Invalid input(s): POCBNP D-Dimer No results for input(s): DDIMER in the last 72 hours. Hemoglobin A1C No results for input(s): HGBA1C in the last 72 hours. Fasting Lipid Panel No results for input(s): CHOL, HDL, LDLCALC, TRIG, CHOLHDL, LDLDIRECT in the last 72 hours. Thyroid  Function Tests No results for input(s): TSH, T4TOTAL, T3FREE, THYROIDAB in the last 72 hours.  Invalid input(s): FREET3 _____________  Dg Chest 2 View  Result Date: 01/09/2019 CLINICAL DATA:  Chest pressure EXAM: CHEST - 2 VIEW COMPARISON:  November 08, 2017. FINDINGS: Lungs are clear. Heart size and pulmonary vascularity are normal. No adenopathy. There is an apparent coronary stent involving the left anterior descending coronary artery. No pneumothorax. No bone lesions. IMPRESSION: No edema or consolidation. Apparent left anterior descending coronary artery stent. Electronically Signed   By: Lowella Grip III M.D.   On: 01/09/2019 09:56   Nm Myocar Multi W/spect W/wall Motion / Ef  Result Date: 01/10/2019 CLINICAL DATA:  Chest discomfort. Possible acute coronary syndrome. Negative troponin. EXAM: MYOCARDIAL IMAGING WITH SPECT (REST AND PHARMACOLOGIC-STRESS) GATED LEFT VENTRICULAR WALL MOTION STUDY LEFT VENTRICULAR EJECTION FRACTION TECHNIQUE: Standard myocardial SPECT imaging was performed after resting intravenous injection of 10 mCi Tc-86m tetrofosmin. Subsequently, intravenous infusion of Lexiscan was performed under the supervision of the Cardiology staff. At peak effect of the drug, 30 mCi Tc-67m tetrofosmin was injected intravenously and  standard myocardial SPECT imaging was performed. Quantitative gated imaging was also performed to evaluate left ventricular wall motion, and estimate left ventricular ejection fraction. COMPARISON:  Prior studies 02/18/2017 (no report) and 11/22/2015 FINDINGS: Perfusion: There is a chronic large infarct involving the apex and distal segments of the anterior, lateral and inferior walls. Appearance is similar to the most recent study. No definite reversible components are identified. Wall Motion: There is hypokinesis of the apex and distal myocardial segments. No paradoxical motion or significant ventricular dilatation. Left Ventricular Ejection Fraction: 48 % End  diastolic volume 123456 ml End systolic volume 63 ml IMPRESSION: 1. Large apical infarct, similar to previous studies. No definite peri-infarct ischemia. 2. Apical hypokinesis.  No new wall motion abnormalities identified. 3. Left ventricular ejection fraction 48%. This is improved from the previous studies (40% and 43%). 4. Non invasive risk stratification*: Intermediate *2012 Appropriate Use Criteria for Coronary Revascularization Focused Update: J Am Coll Cardiol. [id number]. http://content.airportbarriers.com.aspx?articleid=[email or web address] Electronically Signed   By: Richardean Sale M.D.   On: 01/10/2019 15:37   Disposition   Pt is being discharged home today in good condition.  Follow-up Plans & Appointments    Follow-up Information    Troy Sine, MD Follow up.   Specialty: Cardiology Why: Office will call you with a follow up appt.  Contact information: [address] [place] [address] [phone]          Discharge Instructions    Diet - low sodium heart healthy   Complete by: As directed    Increase activity slowly   Complete by: As directed      Discharge Medications     Medication List    TAKE these medications   aspirin EC 81 MG tablet Take 1 tablet (81 mg total) by mouth daily. What changed: when to take this   atorvastatin 80 MG tablet Commonly known as: LIPITOR TAKE 1 TABLET BY MOUTH DAILY AT 6 PM What changed: See the new instructions.   carvedilol 25 MG tablet Commonly known as: COREG Take 1 tablet (25 mg total) by mouth 2 (two) times daily with a meal.   Eliquis 5 MG Tabs tablet Generic drug: apixaban TAKE 1 TABLET(5 MG) BY MOUTH TWICE DAILY What changed: See the new instructions.   Entresto 49-51 MG Generic drug: sacubitril-valsartan Take 1 tablet by mouth 2 (two) times daily.   EpiPen 2-Pak 0.3 mg/0.3 mL Soaj injection Generic drug: EPINEPHrine INJECT 0.3 MLS INTO THE MUSCLE ONCE **PATIENT NEEDS TO SCHEDULE AN  OFFICE VISIT** What changed: See the new instructions.   nitroGLYCERIN 0.4 MG SL tablet Commonly known as: NITROSTAT PLACE 1 TABLET BY MOUTH UNDER THE TONGUE EVERY 5 MINUTES FOR 3 DOSES AS NEEDED FOR CHEST PAIN What changed: See the new instructions.   omeprazole 20 MG capsule Commonly known as: PRILOSEC Take 20 mg by mouth at bedtime.   testosterone 50 MG/5GM (1%) Gel Commonly known as: ANDROGEL Place 5 g onto the skin daily.       No                               Did the patient have a percutaneous coronary intervention (stent / angioplasty)?:  No.    Outstanding Labs/Studies   N/a   Duration of Discharge Encounter   Greater than 30 minutes including physician time.  Signed, Reino Bellis NP-C 01/10/2019, 3:54 PM

## 2019-01-10 NOTE — Progress Notes (Signed)
Cesar Woods Shape to be D/C'd Home per MD order.  Discussed with the patient and all questions fully answered.  VSS, Skin clean, dry and intact without evidence of skin break down, no evidence of skin tears noted.  IV catheter discontinued intact. Site without signs and symptoms of complications. Dressing and pressure applied.  An After Visit Summary was printed and given to the patient. Patient received prescription.  D/c education completed with patient/family including follow up instructions, medication list, d/c activities limitations if indicated, with other d/c instructions as indicated by MD - patient able to verbalize understanding, all questions fully answered.   Patient instructed to return to ED, call 911, or call MD for any changes in condition.   Patient escorted to unit exit/elevator with wife. Patient declined escort via wheelchair with staff.  Howard Pouch 01/10/2019 5:39 PM

## 2019-01-11 ENCOUNTER — Telehealth: Payer: Self-pay | Admitting: Cardiovascular Disease

## 2019-01-11 NOTE — Telephone Encounter (Signed)
° °  Scheduler left msg vcml requesting call back to schedule f/u visit with APP

## 2019-01-11 NOTE — Telephone Encounter (Signed)
-----   Message from Cheryln Manly, NP sent at 01/10/2019  3:52 PM EST ----- Regarding: Follow up appt Please schedule this patient for a follow-up appointment and call them with that information.  Primary Cardiologist: [name] Date of Discharge: 01/10/2019 Appointment Needed Within: 3-4 weeks Appointment Type: hospital follow up  Thank you! Reino Bellis, NP-C

## 2019-01-16 ENCOUNTER — Telehealth (HOSPITAL_COMMUNITY): Payer: Self-pay

## 2019-01-16 NOTE — Telephone Encounter (Signed)
Noted ./cy 

## 2019-01-16 NOTE — Telephone Encounter (Signed)
New message    Just an FYI. We have made several attempts to contact this patient including sending a letter to schedule or reschedule their echocardiogram. We will be removing the patient from the echo WQ.  12.7.20 @ 3:40pm lm on home vm - Tennile Styles  12.4.20 @ 8:40am both # are the same - lm on home vm - Thailyn Khalid  11.30.20 @ 9:20am both # are the same - lm on home vm - Lelend Heinecke 11.30.20 mail reminder letter Jari Sportsman

## 2019-02-06 NOTE — Progress Notes (Signed)
Cardiology Office Note   Date:  02/07/2019   ID:  Cesar Woods, DOB May 05, 1965, MRN [id number]  PCP:  Delilah Shan, MD  Cardiologist:  [name], MD EP: None  Chief Complaint  Patient presents with  . Chest Pain  . Dizziness      History of Present Illness: Cesar Woods is a 53 y.o. 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   He was last evaluated by cardiology during an admission to [facility] from 01/09/2019-01/10/2019 after presenting with complaints of chest pain. He underwent a NST which did not reveal new ischemia, however showed large apical infarct similar to prior studies. Given reassuring imaging, he was discharged home with recommendations to follow-up in 3-4 weeks. His last echo 04/2018 with EF 40-45%, septal and apical akinesis, mild LAE, and calcifications of aortic and mitral valve without stenosis.   He presents today for post-hospitalization follow-up of recent chest pain. He reports chest pain has improved since his ED visit. He has continued to feel sub-par. He has frequent lightheaded spells and reports one episode of near syncope after installing a faucet where he had been laying down for some time, then proceeded to take a shower. No frank syncope. Still with vague arm pain. He has chronic SOB which is unchanged. No dizziness, LE edema, orthopnea, PND, or bleeding with eliquis. He has occasional sharp left hip pain which he plans to discuss with his PCP.    Past Medical History:  Diagnosis Date  . ACS (acute coronary syndrome) ([place]) 11/2015  . Atypical chest pain    Negative Myoview 2010  . Coronary artery disease    a.s/p STEMI in 01/2015 requiring placement of 4 DES to LAD. b. 11/2015: NST showing apical scar  with minimal peri-infarct ischemia.  . Essential hypertension   . Food allergy    Anaphylaxis with shell fish  . GERD (gastroesophageal reflux disease)   . History of migraines   . Hyperlipidemia   . Ischemic cardiomyopathy    a. Echo 05/2015: EF 40-45%, apical akineiss with Grade 1 DD.  [name] Myocardial infarction ([place]) 2016  . Testicle cancer ([place])    In remission - followed by Dr. Risa Grill; had bilateral orchiectomy and XRT in past    Past Surgical History:  Procedure Laterality Date  . CARDIAC CATHETERIZATION N/A 01/15/2015   Procedure: Left Heart Cath and Coronary Angiography;  Surgeon: Troy Sine, MD;  Location: [facility];  Service: Cardiovascular;  Laterality: N/A;  . CARDIAC CATHETERIZATION  01/15/2015   Procedure: Coronary Stent Intervention;  Surgeon: Troy Sine, MD;  Location: [facility];  Service: Cardiovascular;;  . CORONARY BALLOON ANGIOPLASTY N/A 08/17/2017   Procedure: CORONARY BALLOON ANGIOPLASTY;  Surgeon: Leonie Man, MD;  Location: [facility];  Service: Cardiovascular;  Laterality: N/A;  . LEFT HEART CATH AND CORONARY ANGIOGRAPHY N/A 08/17/2017   Procedure: LEFT HEART CATH AND CORONARY ANGIOGRAPHY;  Surgeon: Leonie Man, MD;  Location: [facility];  Service: Cardiovascular;  Laterality: N/A;  . LEFT HEART CATH AND CORONARY ANGIOGRAPHY N/A 11/09/2017   Procedure: LEFT HEART CATH AND CORONARY ANGIOGRAPHY;  Surgeon: Troy Sine, MD;  Location: [facility];  Service: Cardiovascular;  Laterality: N/A;  .  ORCHIECTOMY Bilateral      Current Outpatient Medications  Medication Sig Dispense Refill  . aspirin EC 81 MG tablet Take 1 tablet (81 mg total) by mouth daily. (Patient taking differently: Take 81 mg by mouth at bedtime. ) 90 tablet 3  . atorvastatin (LIPITOR) 80 MG tablet TAKE 1 TABLET BY MOUTH DAILY AT 6 PM 90 tablet 1  . carvedilol (COREG) 25 MG tablet Take 1 tablet (25 mg total) by mouth 2 (two) times daily with a  meal. 60 tablet 6  . ELIQUIS 5 MG TABS tablet TAKE 1 TABLET(5 MG) BY MOUTH TWICE DAILY (Patient taking differently: Take 5 mg by mouth 2 (two) times daily. ) 180 tablet 3  . EPIPEN 2-PAK 0.3 MG/0.3ML SOAJ injection INJECT 0.3 MLS INTO THE MUSCLE ONCE **PATIENT NEEDS TO SCHEDULE AN OFFICE VISIT** (Patient taking differently: Inject 0.3 mg into the muscle daily as needed (allergic reaction). ) 2 Device 0  . nitroGLYCERIN (NITROSTAT) 0.4 MG SL tablet PLACE 1 TABLET BY MOUTH UNDER THE TONGUE EVERY 5 MINUTES FOR 3 DOSES AS NEEDED FOR CHEST PAIN (Patient taking differently: Place 0.4 mg under the tongue every 5 (five) minutes as needed for chest pain. ) 25 tablet 2  . omeprazole (PRILOSEC) 20 MG capsule Take 20 mg by mouth at bedtime.     [name] testosterone (ANDROGEL) 50 MG/5GM (1%) GEL Place 5 g onto the skin daily.    . sacubitril-valsartan (ENTRESTO) 24-26 MG Take 1 tablet by mouth 2 (two) times daily. 60 tablet 1   No current facility-administered medications for this visit.    Allergies:   Iodine, Shellfish allergy, Penicillins, and Ace inhibitors    Social History:  The patient  reports that he has quit smoking. He has never used smokeless tobacco. He reports current alcohol use. He reports that he does not use drugs.   Family History:  The patient's family history includes Heart attack in his father and maternal grandmother; Hyperlipidemia in an other family member; Hypertension in an other family member; Stroke in his paternal grandfather.    ROS:  Please see the history of present illness.   Otherwise, review of systems are positive for none.   All other systems are reviewed and negative.    PHYSICAL EXAM: VS:  BP 104/76   Pulse 75   Temp (!) 97.2 F (36.2 C)   Ht 6' (1.829 m)   Wt 195 lb 3.2 oz (88.5 kg)   SpO2 96%   BMI 26.47 kg/m  , BMI Body mass index is 26.47 kg/m. GEN: Well nourished, well developed, in no acute distress HEENT: sclera anicteric  Neck: no JVD, carotid bruits,  or masses Cardiac: RRR; no murmurs, rubs, or gallops, no edema  Respiratory:  clear to auscultation bilaterally, normal work of breathing GI: soft, nontender, nondistended, + BS MS: no deformity or atrophy Skin: warm and dry, no rash Neuro:  Strength and sensation are intact Psych: euthymic mood, full affect   EKG:  EKG is ordered today. The ekg ordered today demonstrates NSR, rate 75 bpm, no STE/D, no TWI; no significant change from previous.    Recent Labs: 01/09/2019: Hemoglobin 14.1; Platelets 171 01/10/2019: BUN 15; Creatinine, Ser 0.97; Potassium 3.4; Sodium 140    Lipid Panel    Component Value Date/Time   CHOL 94 11/09/2017 0356   CHOL 80 (L) 02/15/2017 1030   TRIG 136 11/09/2017 0356   HDL 27 (L) 11/09/2017 0356   HDL 30 (L) 02/15/2017 1030  CHOLHDL 3.5 11/09/2017 0356   VLDL 27 11/09/2017 0356   LDLCALC 40 11/09/2017 0356   LDLCALC 29 02/15/2017 1030   LDLDIRECT 99.9 09/15/2012 1738      Wt Readings from Last 3 Encounters:  02/07/19 195 lb 3.2 oz (88.5 kg)  01/10/19 189 lb 12.8 oz (86.1 kg)  01/03/19 195 lb 9.6 oz (88.7 kg)      Other studies Reviewed: Additional studies/ records that were reviewed today include:   NST 01/10/2019: 1. Large apical infarct, similar to previous studies. No definite peri-infarct ischemia.  2. Apical hypokinesis.  No new wall motion abnormalities identified.  3. Left ventricular ejection fraction 48%. This is improved from the previous studies (40% and 43%).  4. Non invasive risk stratification*: Intermediate   Echocardiogram 04/2018: IMPRESSIONS   1. The left ventricle has mild-moderately reduced systolic function, with an ejection fraction of 40-45%. The cavity size was mildly dilated. Left ventricular diastolic parameters were normal. 2. Septal and apical akinesis. 3. The right ventricle has normal systolic function. The cavity was normal. There is no increase in right ventricular wall thickness. 4. Left  atrial size was mildly dilated. 5. The mitral valve is degenerative. Mild thickening of the mitral valve leaflet. Mild calcification of the mitral valve leaflet. 6. The tricuspid valve is normal in structure. 7. The aortic valve is tricuspid Mild thickening of the aortic valve Mild calcification of the aortic valve. 8. The pulmonic valve was normal in structure.  Left heart catheterization 11/2017:  Non-stenotic Prox LAD lesion was previously treated.  Non-stenotic Mid LAD lesion was previously treated.  Ost 2nd Diag lesion is 40% stenosed.  Ramus lesion is 40% stenosed.  Ost 1st Mrg-1 lesion is 30% stenosed.  Ost 1st Mrg-2 lesion is 35% stenosed.  Mid RCA lesion is 20% stenosed.  Prox LAD to Mid LAD lesion is 20% stenosed.  LV end diastolic pressure is mildly elevated.  There is mild to moderate left ventricular systolic dysfunction.  The LAD stent is widely patent with the exception of mild residual narrowing of 20% at the site of recent in-stent restenosis was treated with Cutting Balloon in July 2019. The diagonal branch which arises from this segment has both antegrade filling and extensive retrograde collateralization being filled from the apical LAD.  The left circumflex marginal vessel has 30 and 35% proximal to mid stenoses.  RCA is a large dominant vessel with 20% smooth mid narrowing.  Mild to moderate LV dysfunction with an ejection fraction of 40 to 45% with apical akinesis and distal anterolateral hypocontractility. The apical contour raises concern for possible apical thrombus.  RECOMMENDATION: Medical therapy for concomitant CAD and mild to moderate LV dysfunction high potency statin therapy. Optimal blood pressure control with target BP less than 130/80. Recommend repeat echo Doppler study to evaluate for possible apical thrombus.  Recommend dual antiplatelet therapy with Aspirin 81mg  daily and Ticagrelor 90mg  twice daily long-term (beyond 12  months) because of Extensive stents in his LAD system and concomitant CAD.     ASSESSMENT AND PLAN:  1. CAD s/p PCI/DES to LAD with 3 overlapping stents in 2016 with ISR managed with PCI 08/2017: recent NST without ischemia. Chest pain has improved.  - Continue aspirin and statin - Continue carvedilol  2. Chronic combined CHF/ischemic cardiomyopathy: no volume overload complaints, however has been having lightheaded spells, likely related to low blood pressures. EF 40-45% on echo 04/2018 - Will decrease entresto to 24-26mg  BID  - Plan for repeat echo 04/2019 -  Continue carvedilol  3. Apical mural wall thrombus: not present on echo 04/2018, though recommended for ongoing anticoagulation. No complaints of bleeding  - Continue eliquis  4. HTN: BP 104/76 today. Likely soft BP's contributing lightheadedness  - Will decrease entresto to 24-26mg  BID - Continue carvedilol   5. HLD: LDL 40 11/2017 - Continue atorvastatin - Will coordinate FLP at next visit   Current medicines are reviewed at length with the patient today.  The patient does not have concerns regarding medicines.  The following changes have been made:  As above  Labs/ tests ordered today include:  No orders of the defined types were placed in this encounter.    Disposition:   FU with me or [name] in 1 month  Signed, Abigail Butts, PA-C  02/07/2019 4:40 PM

## 2019-02-07 ENCOUNTER — Other Ambulatory Visit: Payer: Self-pay

## 2019-02-07 ENCOUNTER — Ambulatory Visit: Payer: BC Managed Care – PPO | Admitting: Medical

## 2019-02-07 ENCOUNTER — Encounter: Payer: Self-pay | Admitting: Medical

## 2019-02-07 VITALS — BP 104/76 | HR 75 | Temp 97.2°F | Ht 72.0 in | Wt 195.2 lb

## 2019-02-07 DIAGNOSIS — I255 Ischemic cardiomyopathy: Secondary | ICD-10-CM | POA: Diagnosis not present

## 2019-02-07 DIAGNOSIS — R42 Dizziness and giddiness: Secondary | ICD-10-CM

## 2019-02-07 DIAGNOSIS — I10 Essential (primary) hypertension: Secondary | ICD-10-CM | POA: Diagnosis not present

## 2019-02-07 DIAGNOSIS — I2510 Atherosclerotic heart disease of native coronary artery without angina pectoris: Secondary | ICD-10-CM | POA: Diagnosis not present

## 2019-02-07 DIAGNOSIS — I5042 Chronic combined systolic (congestive) and diastolic (congestive) heart failure: Secondary | ICD-10-CM

## 2019-02-07 DIAGNOSIS — E782 Mixed hyperlipidemia: Secondary | ICD-10-CM

## 2019-02-07 MED ORDER — ENTRESTO 24-26 MG PO TABS: 1.0000 | ORAL_TABLET | Freq: Two times a day (BID) | ORAL | 1 refills | Status: DC

## 2019-02-07 NOTE — Patient Instructions (Signed)
Medication Instructions:  Decreased Entresto 24-26mg  twice a day.  *If you need a refill on your cardiac medications before your next appointment, please call your pharmacy*  Lab Work:  Testing/Procedures: None  Follow-Up: At [facility], you and your health needs are our priority.  As part of our continuing mission to provide you with exceptional heart care, we have created designated Provider Care Teams.  These Care Teams include your primary Cardiologist (physician) and Advanced Practice Providers (APPs -  Physician Assistants and Nurse Practitioners) who all work together to provide you with the care you need, when you need it.  Your next appointment:   1 month(s)  The format for your next appointment:   Either In Person or Virtual  Provider:   You may see [name], MD or one of the following Advanced Practice Providers on your designated Care Team:    Roby Lofts, [place]   Other Instructions:  Obtain Blood pressure cuff, and continue to log readings.

## 2019-02-12 ENCOUNTER — Other Ambulatory Visit (INDEPENDENT_AMBULATORY_CARE_PROVIDER_SITE_OTHER): Payer: BC Managed Care – PPO

## 2019-02-12 DIAGNOSIS — I255 Ischemic cardiomyopathy: Secondary | ICD-10-CM | POA: Diagnosis not present

## 2019-02-12 DIAGNOSIS — I2510 Atherosclerotic heart disease of native coronary artery without angina pectoris: Secondary | ICD-10-CM

## 2019-02-12 DIAGNOSIS — I10 Essential (primary) hypertension: Secondary | ICD-10-CM | POA: Diagnosis not present

## 2019-02-12 DIAGNOSIS — I5042 Chronic combined systolic (congestive) and diastolic (congestive) heart failure: Secondary | ICD-10-CM | POA: Diagnosis not present

## 2019-02-12 DIAGNOSIS — I110 Hypertensive heart disease with heart failure: Secondary | ICD-10-CM

## 2019-02-12 DIAGNOSIS — I5020 Unspecified systolic (congestive) heart failure: Secondary | ICD-10-CM

## 2019-03-08 ENCOUNTER — Encounter: Payer: Self-pay | Admitting: Physician Assistant

## 2019-03-08 ENCOUNTER — Ambulatory Visit: Payer: BC Managed Care – PPO | Admitting: Medical

## 2019-03-08 ENCOUNTER — Other Ambulatory Visit: Payer: Self-pay

## 2019-03-08 ENCOUNTER — Ambulatory Visit: Payer: BC Managed Care – PPO | Admitting: Physician Assistant

## 2019-03-08 VITALS — BP 115/81 | HR 84 | Temp 97.0°F | Ht 72.0 in | Wt 198.2 lb

## 2019-03-08 DIAGNOSIS — R42 Dizziness and giddiness: Secondary | ICD-10-CM

## 2019-03-08 DIAGNOSIS — I255 Ischemic cardiomyopathy: Secondary | ICD-10-CM

## 2019-03-08 DIAGNOSIS — I10 Essential (primary) hypertension: Secondary | ICD-10-CM | POA: Diagnosis not present

## 2019-03-08 DIAGNOSIS — E785 Hyperlipidemia, unspecified: Secondary | ICD-10-CM

## 2019-03-08 DIAGNOSIS — I513 Intracardiac thrombosis, not elsewhere classified: Secondary | ICD-10-CM

## 2019-03-08 DIAGNOSIS — I2510 Atherosclerotic heart disease of native coronary artery without angina pectoris: Secondary | ICD-10-CM | POA: Diagnosis not present

## 2019-03-08 NOTE — Progress Notes (Signed)
Cardiology Office Note  Date: 03/10/2019   ID: Cesar Woods, DOB 01/27/1966, MRN [id number]  PCP:  Cesar Shan, MD  Cardiologist:  Cesar Majestic, MD Electrophysiologist:  None   Chief Complaint  Patient presents with  . Follow-up    seen for Cesar Woods.    History of Present Illness: Cesar Woods is a 54 y.o. male last encounter with Cesar Woods.  Follow-up for chest pain and dizziness.  Patient has PMH of CAD s/p STEMI in 2016 with PCI/DES to LAD with 3 overlapping stents.  Patient had in-stent restenosis with PCI and July 2019.  History of ischemic cardiomyopathy history of apical mural wall thrombus.  Patient is on apixaban.  Other medical history includes hypertension hyperlipidemia, testicular cancers s/p bilateral orchiectomy and radiation therapy.  Myoview in December 2020 showed no ischemia, large apical infarct similar to prior study.  He was discharged to home with recommendations to follow-up in 3 weeks.  Previous echo March 2020 showed an EF of 40 to 45% with septal / apical akinesis, mild LAE, and aortic calcifications.  At this last visit he reported improvement in his chest pain since emergency room visit.  He complained of being lightheaded and near syncope but no actual syncopal episode occurred.  He still complained of leg arm pain at the last visit. Complained of chronic shortness of breath and occasional sharp left hip pain.  Due to episodes of dizziness and presyncope, his Cesar Woods was decreased to 24-26 Woods twice daily.  Patient presents today for cardiology office visit.  His dizziness has completely resolved.  He denies any chest pain or shortness of breath.  He will need a repeat echocardiogram in 2 months to check for mural thrombus.  If new or thrombus is resolved, I will defer to Cesar Woods to consider take the patient off of Cesar Woods.  Cardiac medications include; aspirin 81 Woods daily, atorvastatin 80 Woods daily, carvedilol 25 Woods p.o. twice daily,  Cesar Woods 5 Woods p.o. twice daily, sublingual nitroglycerin 0.4 Woods sublingual as needed, Cesar Woods p.o. twice daily.  Past Medical History:  Diagnosis Date  . ACS (acute coronary syndrome) ([place]) 11/2015  . Atypical chest pain    Negative Myoview 2010  . Coronary artery disease    a.s/p STEMI in 01/2015 requiring placement of 4 DES to LAD. b. 11/2015: NST showing apical scar with minimal peri-infarct ischemia.  . Essential hypertension   . Food allergy    Anaphylaxis with shell fish  . GERD (gastroesophageal reflux disease)   . History of migraines   . Hyperlipidemia   . Ischemic cardiomyopathy    a. Echo 05/2015: EF 40-45%, apical akineiss with Grade 1 DD.  [name] Myocardial infarction ([place]) 2016  . Testicle cancer ([place])    In remission - followed by Dr. Risa Grill; had bilateral orchiectomy and XRT in past    Past Surgical History:  Procedure Laterality Date  . CARDIAC CATHETERIZATION N/A 01/15/2015   Procedure: Left Heart Cath and Coronary Angiography;  Surgeon: Troy Sine, MD;  Location: [place] [facility];  Service: Cardiovascular;  Laterality: N/A;  . CARDIAC CATHETERIZATION  01/15/2015   Procedure: Coronary Stent Intervention;  Surgeon: Troy Sine, MD;  Location: [place] [facility];  Service: Cardiovascular;;  . CORONARY BALLOON ANGIOPLASTY N/A 08/17/2017   Procedure: CORONARY BALLOON ANGIOPLASTY;  Surgeon: Leonie Man, MD;  Location: [place] [facility];  Service: Cardiovascular;  Laterality: N/A;  . LEFT HEART CATH AND  CORONARY ANGIOGRAPHY N/A 08/17/2017   Procedure: LEFT HEART CATH AND CORONARY ANGIOGRAPHY;  Surgeon: Leonie Man, MD;  Location: [place] [facility];  Service: Cardiovascular;  Laterality: N/A;  . LEFT HEART CATH AND CORONARY ANGIOGRAPHY N/A 11/09/2017   Procedure: LEFT HEART CATH AND CORONARY ANGIOGRAPHY;  Surgeon: Troy Sine, MD;  Location: [place] [facility];  Service: Cardiovascular;  Laterality: N/A;  . ORCHIECTOMY Bilateral     Current  Outpatient Medications  Medication Sig Dispense Refill  . aspirin EC 81 Woods tablet Take 1 tablet (81 Woods total) by mouth daily. (Patient taking differently: Take 81 Woods by mouth at bedtime. ) 90 tablet 3  . atorvastatin (LIPITOR) 80 Woods tablet TAKE 1 TABLET BY MOUTH DAILY AT 6 PM 90 tablet 1  . carvedilol (COREG) 25 Woods tablet Take 1 tablet (25 Woods total) by mouth 2 (two) times daily with a meal. 60 tablet 6  . Cesar Woods 5 Woods TABS tablet TAKE 1 TABLET(5 Woods) BY MOUTH TWICE DAILY (Patient taking differently: Take 5 Woods by mouth 2 (two) times daily. ) 180 tablet 3  . EPIPEN 2-PAK 0.3 Woods/0.3ML SOAJ injection INJECT 0.3 MLS INTO THE MUSCLE ONCE **PATIENT NEEDS TO SCHEDULE AN OFFICE VISIT** (Patient taking differently: Inject 0.3 Woods into the muscle daily as needed (allergic reaction). ) 2 Device 0  . nitroGLYCERIN (NITROSTAT) 0.4 Woods SL tablet PLACE 1 TABLET BY MOUTH UNDER THE TONGUE EVERY 5 MINUTES FOR 3 DOSES AS NEEDED FOR CHEST PAIN (Patient taking differently: Place 0.4 Woods under the tongue every 5 (five) minutes as needed for chest pain. ) 25 tablet 2  . omeprazole (PRILOSEC) 20 Woods capsule Take 20 Woods by mouth at bedtime.     . sacubitril-valsartan (Cesar Woods) 24-26 Woods Take 1 tablet by mouth 2 (two) times daily. 60 tablet 1  . testosterone (ANDROGEL) 50 Woods/5GM (1%) GEL Place 5 g onto the skin daily.     No current facility-administered medications for this visit.   Allergies:  Iodine, Shellfish allergy, Penicillins, and Ace inhibitors   Social History: The patient  reports that he has quit smoking. He has never used smokeless tobacco. He reports current alcohol use. He reports that he does not use drugs.   Family History: The patient's family history includes Heart attack in his father and maternal grandmother; Hyperlipidemia in an other family member; Hypertension in an other family member; Stroke in his paternal grandfather.   ROS:  Please see the history of present illness. Otherwise, complete review of  systems is positive for none.  All other systems are reviewed and negative.   Physical Exam: VS:  BP 115/81   Pulse 84   Temp (!) 97 F (36.1 C)   Ht 6' (1.829 m)   Wt 198 lb 3.2 oz (89.9 kg)   SpO2 95%   BMI 26.88 kg/m , BMI Body mass index is 26.88 kg/m.  Wt Readings from Last 3 Encounters:  03/08/19 198 lb 3.2 oz (89.9 kg)  02/07/19 195 lb 3.2 oz (88.5 kg)  01/10/19 189 lb 12.8 oz (86.1 kg)    General: Patient appears comfortable at rest. HEENT: Conjunctiva and lids normal, oropharynx clear with moist mucosa. Neck: Supple, no elevated JVP or carotid bruits, no thyromegaly. Lungs: Clear to auscultation, nonlabored breathing at rest. Cardiac: Regular rate and rhythm, no S3 or significant systolic murmur, no pericardial rub. Abdomen: Soft, nontender, no hepatomegaly, bowel sounds present, no guarding or rebound. Extremities: No pitting edema, distal pulses 2+. Skin:  Warm and dry. Musculoskeletal: No kyphosis. Neuropsychiatric: Alert and oriented x3, affect grossly appropriate.  ECG:  An ECG dated February 12, 2019 was personally reviewed today and demonstrated:  Normal sinus rhythm rate of 75, anteroseptal infarct, age undetermined  Recent Labwork: 01/09/2019: Hemoglobin 14.1; Platelets 171 01/10/2019: BUN 15; Creatinine, Ser 0.97; Potassium 3.4; Sodium 140     Component Value Date/Time   CHOL 94 11/09/2017 0356   CHOL 80 (L) 02/15/2017 1030   TRIG 136 11/09/2017 0356   HDL 27 (L) 11/09/2017 0356   HDL 30 (L) 02/15/2017 1030   CHOLHDL 3.5 11/09/2017 0356   VLDL 27 11/09/2017 0356   LDLCALC 40 11/09/2017 0356   LDLCALC 29 02/15/2017 1030   LDLDIRECT 99.9 09/15/2012 1738    Other Studies Reviewed Today:   Assessment and Plan:  1. Dizziness   2. LV (left ventricular) mural thrombus   3. Coronary artery disease involving native coronary artery of native heart without angina pectoris   4. Essential hypertension   5. Hyperlipidemia LDL goal <70   6. Ischemic  cardiomyopathy    1.  Dizziness: Symptom improved after reducing Cesar Woods.   2.  LV thrombus: Currently on Cesar Woods. We will repeat echocardiogram in 2 months, if LV thrombus is resolved, will defer to Cesar Woods to decide on the duration of Cesar Woods.  3.  CAD: Denies any chest pain.  On aspirin and Lipitor  4. Ischemic cardiomyopathy: Repeat echocardiogram in 2 months.  5. Hypertension: Blood pressure stable  6. Hyperlipidemia: On Lipitor   Medication Adjustments/Labs and Tests Ordered: Current medicines are reviewed at length with the patient today.  Concerns regarding medicines are outlined above.    Patient Instructions  Medication Instructions:  Your physician recommends that you continue on your current medications as directed. Please refer to the Current Medication list given to you today.  *If you need a refill on your cardiac medications before your next appointment, please call your pharmacy*  Lab Work: NONE ordered at this time of appointment   If you have labs (blood work) drawn today and your tests are completely normal, you will receive your results only by: [name] MyChart Message (if you have MyChart) OR . A paper copy in the mail If you have any lab test that is abnormal or we need to change your treatment, we will call you to review the results.  Testing/Procedures: Your physician has requested that you have an echocardiogram. Echocardiography is a painless test that uses sound waves to create images of your heart. It provides your doctor with information about the size and shape of your heart and how well your heart's chambers and valves are working. This procedure takes approximately one hour. There are no restrictions for this procedure. THIS TEST IS PERFORMED AT [place] [address]   PLEASE SCHEDULE FOR 2 MONTHS (MID TO LATE MARCH 2021)  Follow-Up: At [facility], you and your health needs are our priority.  As part of our continuing mission to  provide you with exceptional heart care, we have created designated Provider Care Teams.  These Care Teams include your primary Cardiologist (physician) and Advanced Practice Providers (APPs -  Physician Assistants and Nurse Practitioners) who all work together to provide you with the care you need, when you need it.  Your next appointment:   3-4 month(s)  The format for your next appointment:   In Person  Provider:   [name], MD  Other Instructions  Signed, Levell July, NP 03/10/2019 11:37 PM    [facility] [place], [place], [place] [address] Phone: [phone]; Fax: [phone]

## 2019-03-08 NOTE — Patient Instructions (Addendum)
Medication Instructions:  Your physician recommends that you continue on your current medications as directed. Please refer to the Current Medication list given to you today.  *If you need a refill on your cardiac medications before your next appointment, please call your pharmacy*  Lab Work: NONE ordered at this time of appointment   If you have labs (blood work) drawn today and your tests are completely normal, you will receive your results only by: [name] MyChart Message (if you have MyChart) OR . A paper copy in the mail If you have any lab test that is abnormal or we need to change your treatment, we will call you to review the results.  Testing/Procedures: Your physician has requested that you have an echocardiogram. Echocardiography is a painless test that uses sound waves to create images of your heart. It provides your doctor with information about the size and shape of your heart and how well your heart's chambers and valves are working. This procedure takes approximately one hour. There are no restrictions for this procedure. THIS TEST IS PERFORMED AT [place] [address]   PLEASE SCHEDULE FOR 2 MONTHS (MID TO LATE MARCH 2021)  Follow-Up: At [facility], you and your health needs are our priority.  As part of our continuing mission to provide you with exceptional heart care, we have created designated Provider Care Teams.  These Care Teams include your primary Cardiologist (physician) and Advanced Practice Providers (APPs -  Physician Assistants and Nurse Practitioners) who all work together to provide you with the care you need, when you need it.  Your next appointment:   3-4 month(s)  The format for your next appointment:   In Person  Provider:   [name], MD  Other Instructions

## 2019-03-10 ENCOUNTER — Encounter: Payer: Self-pay | Admitting: Physician Assistant

## 2019-04-09 ENCOUNTER — Other Ambulatory Visit: Payer: Self-pay | Admitting: Adult Health

## 2019-04-09 ENCOUNTER — Other Ambulatory Visit: Payer: Self-pay | Admitting: Medical

## 2019-04-12 ENCOUNTER — Telehealth: Payer: Self-pay | Admitting: Cardiovascular Disease

## 2019-04-12 NOTE — Telephone Encounter (Signed)
New Message  Patient called and stated that he was having problems getting the prescription for Eliquis. Said he was able to get the rest of his medication.

## 2019-04-12 NOTE — Telephone Encounter (Signed)
Pt needs a prior authorization for eliquis 5mg  will route to [facility] dr [facility] nurse to complete samples given

## 2019-04-18 NOTE — Telephone Encounter (Signed)
Spoke with pt and notified that the Prior Authorization for his eliquis 5mg  has been sent in and that it may take up to 72 hours to hear back, but samples will be left for him at the front of our office for him to pick up tomorrow. Pt verbalized understanding and was thankful for the samples stating that tomorrow was his last dose so he will come by to pick them up then.   Samples left in front of office for pickup

## 2019-04-18 NOTE — Telephone Encounter (Signed)
Follow up    Patient is following up on prior authorization for his Eliquis

## 2019-04-20 ENCOUNTER — Other Ambulatory Visit: Payer: Self-pay | Admitting: Pharmacist

## 2019-04-20 MED ORDER — APIXABAN 5 MG PO TABS: ORAL_TABLET | ORAL | 1 refills | Status: DC

## 2019-04-24 NOTE — Telephone Encounter (Signed)
[id number] 3/16- to discuss PA for eliquis

## 2019-04-27 NOTE — Telephone Encounter (Signed)
Call coming in from pt to discuss PA on eliquis. Pt states that he has been on the same insurance for 5+ years and he has never had this issue with any of his heart medications.  States that he was in touch with his pharmacy and thought he would be able to refill the medication but their system kicked his claim out due to pending for too long.  Notified pt I would get in touch with pharmacy for clarification. Pt verbalized understanding.  Spoke with pts pharmacy at [facility]. They stated that PA needed to be sent but that their computers had been having issues recently and they were not able to start the PA so PA # given [id number]  Called and spoke with express scripts representative for prior authorization.  Case id [id number] was approved from 04/27/19 until 10/28/19 Authorization code: [id number] Cost for 90 day supply is $75  Called and spoke with pt. Notified of Eliquis 5mg  approval.  Pt thankful and had no other questions at this time.

## 2019-04-30 ENCOUNTER — Other Ambulatory Visit: Payer: Self-pay

## 2019-04-30 ENCOUNTER — Ambulatory Visit (HOSPITAL_COMMUNITY): Payer: BC Managed Care – PPO | Attending: Cardiovascular Disease

## 2019-04-30 DIAGNOSIS — I513 Intracardiac thrombosis, not elsewhere classified: Secondary | ICD-10-CM

## 2019-04-30 MED ORDER — PERFLUTREN LIPID MICROSPHERE
1.0000 mL | INTRAVENOUS | Status: AC | PRN
  Administered 2019-04-30: 3 mL via INTRAVENOUS

## 2019-05-01 DIAGNOSIS — Z86010 Personal history of colonic polyps: Secondary | ICD-10-CM | POA: Diagnosis not present

## 2019-05-02 ENCOUNTER — Other Ambulatory Visit: Payer: Self-pay | Admitting: Cardiovascular Disease

## 2019-05-24 ENCOUNTER — Telehealth: Payer: Self-pay | Admitting: *Deleted

## 2019-05-24 NOTE — Telephone Encounter (Signed)
   [facility] Pre-operative Risk Assessment    Request for surgical clearance:  1. What type of surgery is being performed? Colonoscopy  2. When is this surgery scheduled? 06/25/19   3. What type of clearance is required (medical clearance vs. Pharmacy clearance to hold med vs. Both)? both  4. Are there any medications that need to be held prior to surgery and how long?Eliquis   5. Practice name and name of physician performing surgery? [facility] Dr. Penelope Coop   6. What is your office phone number [phone]    7.   What is your office fax number [phone]  8.   Anesthesia type (None, local, MAC, general) ?    Ayaana Biondo A Verbie Babic 05/24/2019, 4:34 PM  _________________________________________________________________   (provider comments below)

## 2019-05-24 NOTE — Telephone Encounter (Signed)
Patient has an appointment with Dr Claiborne Billings scheduled for 05/28/2019; therefore, pre-op evaluation can be performed at that time. Will route note to [name] so that he is aware and remove from pre-op pool.

## 2019-05-28 ENCOUNTER — Ambulatory Visit: Payer: BC Managed Care – PPO | Admitting: Cardiovascular Disease

## 2019-05-28 ENCOUNTER — Other Ambulatory Visit: Payer: Self-pay

## 2019-05-28 ENCOUNTER — Encounter: Payer: Self-pay | Admitting: Cardiovascular Disease

## 2019-05-28 VITALS — BP 102/75 | HR 72 | Ht 72.0 in | Wt 204.4 lb

## 2019-05-28 DIAGNOSIS — I255 Ischemic cardiomyopathy: Secondary | ICD-10-CM

## 2019-05-28 DIAGNOSIS — I513 Intracardiac thrombosis, not elsewhere classified: Secondary | ICD-10-CM

## 2019-05-28 DIAGNOSIS — I10 Essential (primary) hypertension: Secondary | ICD-10-CM | POA: Diagnosis not present

## 2019-05-28 DIAGNOSIS — I2510 Atherosclerotic heart disease of native coronary artery without angina pectoris: Secondary | ICD-10-CM

## 2019-05-28 DIAGNOSIS — I252 Old myocardial infarction: Secondary | ICD-10-CM

## 2019-05-28 DIAGNOSIS — E785 Hyperlipidemia, unspecified: Secondary | ICD-10-CM | POA: Diagnosis not present

## 2019-05-28 MED ORDER — CLOPIDOGREL BISULFATE 75 MG PO TABS: 75.0000 mg | ORAL_TABLET | Freq: Every day | ORAL | 3 refills | Status: DC

## 2019-05-28 NOTE — Patient Instructions (Signed)
Medication Instructions:  WHEN YOU FINISH YOUR REMAINING ELIQUIS, BEGIN TAKING PLAVIX 75MG  DAILY. ( THE FIRST DAY YOU TAKE THE PLAVIX YOU WILL TAKE A LOADING DOSE OF 300MG - 4 TABLETS.) AFTER THIS IT IS 75MG  DAILY   *If you need a refill on your cardiac medications before your next appointment, please call your pharmacy*   Lab Work: FASTING LABS:  CBC CMET LIPID TSH  1 WEEK AFTER STARTING THE PLAVIX: NEED TO HAVE A p2y12 DRAWN AT [place] [facility]  If you have labs (blood work) drawn today and your tests are completely normal, you will receive your results only by: [name] MyChart Message (if you have MyChart) OR . A paper copy in the mail If you have any lab test that is abnormal or we need to change your treatment, we will call you to review the results.     Follow-Up: At [facility], you and your health needs are our priority.  As part of our continuing mission to provide you with exceptional heart care, we have created designated Provider Care Teams.  These Care Teams include your primary Cardiologist (physician) and Advanced Practice Providers (APPs -  Physician Assistants and Nurse Practitioners) who all work together to provide you with the care you need, when you need it.  We recommend signing up for the patient portal called "MyChart".  Sign up information is provided on this After Visit Summary.  MyChart is used to connect with patients for Virtual Visits (Telemedicine).  Patients are able to view lab/test results, encounter notes, upcoming appointments, etc.  Non-urgent messages can be sent to your provider as well.   To learn more about what you can do with MyChart, go to [email or web address].    Your next appointment:   6 month(s)  The format for your next appointment:   In Person  Provider:   [name], MD

## 2019-05-28 NOTE — Progress Notes (Signed)
Patient ID: Cesar Woods, male   DOB: Jan 24, 1966, 54 y.o.   MRN: [id number]    Primary MD: [name]  HPI: Cesar Woods is a 54 y.o. male who presents to the office today for a 5 month follow-up cardiology evaluation.  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  In December, he developed substernal chest discomfort which he initially attributed to GERD.  This continued to recur over that day.  The following day he went to work and continued to experience vague discomfort.  He was seen by his primary physician, Dr. Scarlette Woods in his ECG was abnormal.  Changes of an anterolateral MI.  He presented to the emergency room.  He was not having any chest pain or shortness of breath or palpitations.  He underwent cardiac catheterization the following morning on 01/15/2015 which revealed significant CAD with 90% ostial LAD stenosis followed by 99% proximal LAD stenosis in the region of the first septal perforating artery with significant thrombus.  There was diffuse 50% stenosis followed by 95% ulcerated plaque in the proximal to mid LAD.  There also appeared to be an apical LAD dissection.  40% ramus intermediate, like high marginal stenosis and a large dominant RCA with 30% mid stenosis.  Large PDA and PLA vessels with septal collateralization to the proximal LAD.  He was felt to have an out of hospital late presentation anterolateral infarction secondary to his multiple high-grade stenoses.  He underwent a very difficult but successful intervention to his diffusely diseased LAD.  There was evidence for a spiral dissection in the mid distal LAD but ultimately was insertion of 3 tandem synergy DES stents (0.538, 3.038, and 3.020 mm from the mid LAD to the ostium and PTCA of the distal LAD with low-level inflation tach up his apical initial dissection.  There was total occlusion of the diagonal vessel  antegrade but there was significant development of retrograde collaterals to this diagonal vessel from the distal LAD.  Subsequent, he stabilized well.  He was seen in follow-up by Cesar Woods on 01/24/2015 and was doing well without chest pain or shortness of breath.  At that time his blood pressure was controlled.  He participated in the cardiac rehabilitation program.  He is back at work and has a stressful job in Engineer, mining for [employer].   When I saw him for follow-up evaluation in March 2017.  His ECG showed Q waves in V1 through V4 4 with persistent T-wave inversion anteriorly and anterolaterally.  I recommended that he discontinue metoprolol succinate and changed him to carvedilol.  He is now on carvedilol at 6.25 mg twice a day and is tolerating this well.  He continues to be on low-dose losartan 12.5 mg.  I scheduled him for follow-up echo Doppler study to reassess his LV function.  This was done on 05/30/2015.  Ejection fraction was now 40-45% and there was evidence for akinesis of the apical myocardium.  There was grade 1 diastolic dysfunction.  When I saw him in May 2017 for follow-up evaluation, he denied recurrent chest pain, PND or orthopnea.  At time, I slowly titrated ARB therapy and increase losartan to 25 mg at bedtime.  So increased his carvedilol to 9.375 mg twice a day for 2 weeks and further titrated this to 12.5 mg daily.    He developed a recurrent episode of chest pain in October 2017 and was hospitalized.  A nuclear perfusion study was performed which showed his previous large fixed defect involving the apex extending towards the mid ventricle in the anterior and lateral wall.  There is very minimal if any peri-infarct ischemia with an EF of 43%.  These were felt to be consistent with his prior MI.    Since I  saw him in March 2018 he had been evaluated in the emergency room with intermittent chest and back pain.  He had undergone a stress Myoview study in January 2019  which showed an EF of 40% with a large defect consistent with his prior LAD infarction and there was no significant ischemia except mild peri-infarction ischemia.  In July 2019 he developed recurrent symptomatology and when I was on vacation and underwent repeat catheterization by Dr. Ellyn Woods.  He was found to have 85% narrowing in the midportion of his long LAD stent in the region of the diagonal takeoff.  The diagonal vessel was occluded but had excellent collaterals.  He was rehospitalized in October 2019 with recurrent chest pain.  At that time on November 09, 2017 I performed repeat cardiac catheterization.  This revealed a widely patent LAD stent with mild residual narrowing of 20% at the site of recent in-stent restenosis that was treated with Cutting Balloon in July 2019.  The diagonal branch which arose from this segment has both antegrade filling and extensive retrograde collateralization being filled from the apical LAD.  The left circumflex marginal vessel had 30 and 35% proximal to mid stenoses.  The RCA was a large dominant vessel with smooth 20% narrowing.  He had mild to moderate LV dysfunction and his ejection fraction was improved to 40 to 45% with apical kinesis and distal anterolateral hypocontractility.  His apical contour raised concern for possible apical thrombus.  An echo Doppler study was suspicious for a layer of mural thrombus apically which was different from July 2019.  As result he was started on apixaban.  I saw him for follow-up on November 28, 2017 at which time he continued to remain stable and has been without chest pain.  He is on Eliquis 5 mg, Plavix 75 mg, carvedilol 25 mg twice a day, atorvastatin 80 mg and has been on losartan 25 mg daily.  He denies chest tightness, baseline shortness of breath but had noted some mild shortness of breath with exertion.  During that evaluation, I discontinued losartan and initiated Entresto.  He took 24/25 mg twice daily for 2 weeks and  was then further titrated up to 49/51 mg twice a day.  He has continued to be on atorvastatin 80 mg with target LDL less than 70.  I  saw him in December 2019 at which time he was feeling improved with initiation of Entresto since initiating.  Over the past year, he has been titrated up to maximal dosing at 97/103 mg twice a day.  He has continued to take carvedilol 25 mg twice a day.  He has been on Eliquis 5 mg twice a day with Plavix and has been on Prilosec.  He was evaluated in August 2020 by Dr. Davina Poke prior to undergoing colonoscopy given instructions to hold his Plavix and Eliquis.  The past he was documented to have an apical mural thrombus and on his most recent echo in March 2020 EF was 40 to 45%.  There was septal and apical akinesis secondary to his prior MI.  There was no apical thrombus noted which had resolved from his previous go.  During  that evaluation blood pressure was low.  Last saw him on January 03, 2019.  Prior to that evaluation over the past several months he had noticed some episodes of mild dizziness.  He denied any palpitations or awareness of arrhythmia.  He had experienced some depression secondary to the COVID-19 pandemic.    During that evaluation his blood pressure was low and on exam there was a 10 mm orthostatic drop from supine to standing.  Consequently I reduced his Entresto back to 49/51 mg twice a day.  Since it was well over a year from his ACS and stenting I recommended discontinuance of Plavix and resuming aspirin 81 mg to take along with Eliquis 5 mg twice a day.  He underwent a follow-up echo Doppler study on April 30, 2019 which showed an EF of 35 to 40%.  There was grade 1 diastolic dysfunction.  There was severe akinesis of the apex and distal anteroseptal new from lateral wall.  Definity contrast was used and there was no apical thrombus.  Presently, he feels well.  He has Covid vaccinations.  He will be planning to undergo a colonoscopy in May.  He  denies chest pain PND orthopnea.  He denies any orthostatic symptoms.  He presents for evaluation.  Past Medical History:  Diagnosis Date  . ACS (acute coronary syndrome) ([place]) 11/2015  . Atypical chest pain    Negative Myoview 2010  . Coronary artery disease    a.s/p STEMI in 01/2015 requiring placement of 4 DES to LAD. b. 11/2015: NST showing apical scar with minimal peri-infarct ischemia.  . Essential hypertension   . Food allergy    Anaphylaxis with shell fish  . GERD (gastroesophageal reflux disease)   . History of migraines   . Hyperlipidemia   . Ischemic cardiomyopathy    a. Echo 05/2015: EF 40-45%, apical akineiss with Grade 1 DD.  [name] Myocardial infarction ([place]) 2016  . Testicle cancer ([place])    In remission - followed by Dr. Risa Grill; had bilateral orchiectomy and XRT in past    Past Surgical History:  Procedure Laterality Date  . CARDIAC CATHETERIZATION N/A 01/15/2015   Procedure: Left Heart Cath and Coronary Angiography;  Surgeon: Troy Sine, MD;  Location: [facility];  Service: Cardiovascular;  Laterality: N/A;  . CARDIAC CATHETERIZATION  01/15/2015   Procedure: Coronary Stent Intervention;  Surgeon: Troy Sine, MD;  Location: [facility];  Service: Cardiovascular;;  . CORONARY BALLOON ANGIOPLASTY N/A 08/17/2017   Procedure: CORONARY BALLOON ANGIOPLASTY;  Surgeon: Leonie Man, MD;  Location: [facility];  Service: Cardiovascular;  Laterality: N/A;  . LEFT HEART CATH AND CORONARY ANGIOGRAPHY N/A 08/17/2017   Procedure: LEFT HEART CATH AND CORONARY ANGIOGRAPHY;  Surgeon: Leonie Man, MD;  Location: [facility];  Service: Cardiovascular;  Laterality: N/A;  . LEFT HEART CATH AND CORONARY ANGIOGRAPHY N/A 11/09/2017   Procedure: LEFT HEART CATH AND CORONARY ANGIOGRAPHY;  Surgeon: Troy Sine, MD;  Location: [facility];  Service: Cardiovascular;  Laterality: N/A;  . ORCHIECTOMY Bilateral     Allergies  Allergen Reactions  .  Iodine Anaphylaxis    Shellfish allergy   . Shellfish Allergy Anaphylaxis  . Penicillins Other (See Comments)    Childhood allergy Has patient had a PCN reaction causing immediate rash, facial/tongue/throat swelling, SOB or lightheadedness with hypotension: Unknown Has patient had a PCN reaction causing severe rash involving mucus membranes or skin necrosis: Unknown Has patient had  a PCN reaction that required hospitalization: No Has patient had a PCN reaction occurring within the last 10 years: No If all of the above answers are "NO", then may proceed with Cephalosporin use.    . Ace Inhibitors Cough    Current Outpatient Medications  Medication Sig Dispense Refill  . apixaban (ELIQUIS) 5 MG TABS tablet TAKE 1 TABLET(5 MG) BY MOUTH TWICE DAILY 180 tablet 1  . aspirin EC 81 MG tablet Take 1 tablet (81 mg total) by mouth daily. (Patient taking differently: Take 81 mg by mouth at bedtime. ) 90 tablet 3  . atorvastatin (LIPITOR) 80 MG tablet TAKE 1 TABLET BY MOUTH DAILY AT 6 PM 90 tablet 1  . carvedilol (COREG) 25 MG tablet TAKE 1 TABLET(25 MG) BY MOUTH TWICE DAILY WITH A MEAL 60 tablet 8  . ENTRESTO 24-26 MG TAKE 1 TABLET BY MOUTH TWICE DAILY 60 tablet 6  . EPIPEN 2-PAK 0.3 MG/0.3ML SOAJ injection INJECT 0.3 MLS INTO THE MUSCLE ONCE **PATIENT NEEDS TO SCHEDULE AN OFFICE VISIT** (Patient taking differently: Inject 0.3 mg into the muscle daily as needed (allergic reaction). ) 2 Device 0  . nitroGLYCERIN (NITROSTAT) 0.4 MG SL tablet PLACE 1 TABLET BY MOUTH UNDER THE TONGUE EVERY 5 MINUTES FOR 3 DOSES AS NEEDED FOR CHEST PAIN (Patient taking differently: Place 0.4 mg under the tongue every 5 (five) minutes as needed for chest pain. ) 25 tablet 2  . omeprazole (PRILOSEC) 20 MG capsule Take 20 mg by mouth at bedtime.     . Testosterone 1.62 % GEL SMARTSIG:4 Pump Topical Daily    . clopidogrel (PLAVIX) 75 MG tablet Take 1 tablet (75 mg total) by mouth daily. 90 tablet 3   No current  facility-administered medications for this visit.    Social History   Socioeconomic History  . Marital status: Married    Spouse name: Not on file  . Number of children: Not on file  . Years of education: Not on file  . Highest education level: Not on file  Occupational History  . Not on file  Tobacco Use  . Smoking status: Former Research scientist (life sciences)  . Smokeless tobacco: Never Used  . Tobacco comment: quit around 2010  Substance and Sexual Activity  . Alcohol use: Yes    Alcohol/week: 0.0 standard drinks    Comment: occasionally  . Drug use: No    Comment: remote use of marijuana, quit a long time ago  . Sexual activity: Not on file  Other Topics Concern  . Not on file  Social History Narrative   Occupation:Senior Trader for [employer]   Divorced    No children      Former smoker    Alcohol Use - yes         Social Determinants of Health   Financial Resource Strain:   . Difficulty of Paying Living Expenses:   Food Insecurity:   . Worried About Charity fundraiser in the Last Year:   . Arboriculturist in the Last Year:   Transportation Needs:   . Film/video editor (Medical):   [name] Lack of Transportation (Non-Medical):   Physical Activity:   . Days of Exercise per Week:   . Minutes of Exercise per Session:   Stress:   . Feeling of Stress :   Social Connections:   . Frequency of Communication with Friends and Family:   . Frequency of Social Gatherings with Friends and Family:   . Attends Religious Services:   .  Active Member of Clubs or Organizations:   . Attends Archivist Meetings:   [name] Marital Status:   Intimate Partner Violence:   . Fear of Current or Ex-Partner:   . Emotionally Abused:   [name] Physically Abused:   . Sexually Abused:    Additional social history is notable that he was born in [place], [place].  He lived for over a decade in [place].  He is married.  Former smoker.  Family History  Problem Relation Age of Onset  . Heart attack Father         CABG at agae 59  . Stroke Paternal Grandfather   . Hypertension Other   . Hyperlipidemia Other   . Heart attack Maternal Grandmother     ROS General: Negative; No fevers, chills, or night sweats HEENT: Negative; No changes in vision or hearing, sinus congestion, difficulty swallowing Pulmonary: Negative; No cough, wheezing, shortness of breath, hemoptysis Cardiovascular: See HPI: No recurrent chest pain, presyncope, syncope, palpatations GI: Negative; No nausea, vomiting, diarrhea, or abdominal pain GU: History of testicular cancer, status post orchiectomy. Musculoskeletal: Negative; no myalgias, joint pain, or weakness Hematologic: Negative; no easy bruising, bleeding Endocrine: Negative; no heat/cold intolerance; no diabetes, Neuro: Negative; no changes in balance, headaches Skin: Negative; No rashes or skin lesions Psychiatric: Negative; No behavioral problems, depression Sleep: Negative; No snoring,  daytime sleepiness, hypersomnolence, bruxism, restless legs, hypnogognic hallucinations. Other comprehensive 14 point system review is negative   Physical Exam BP 102/75   Pulse 72   Ht 6' (1.829 m)   Wt 204 lb 6.4 oz (92.7 kg)   SpO2 96%   BMI 27.72 kg/m    Repeat blood pressure by me was 110/72 supine and 114/76 standing  Wt Readings from Last 3 Encounters:  05/28/19 204 lb 6.4 oz (92.7 kg)  03/08/19 198 lb 3.2 oz (89.9 kg)  02/07/19 195 lb 3.2 oz (88.5 kg)   General: Alert, oriented, no distress.  Skin: normal turgor, no rashes, warm and dry HEENT: Normocephalic, atraumatic. Pupils equal round and reactive to light; sclera anicteric; extraocular muscles intact;  Nose without nasal septal hypertrophy Mouth/Parynx benign; Mallinpatti scale 3 Neck: No JVD, no carotid bruits; normal carotid upstroke Lungs: clear to ausculatation and percussion; no wheezing or rales Chest wall: without tenderness to palpitation Heart: PMI not displaced, RRR, s1 s2 normal, 1/6  systolic murmur, no diastolic murmur, no rubs, gallops, thrills, or heaves Abdomen: soft, nontender; no hepatosplenomehaly, BS+; abdominal aorta nontender and not dilated by palpation. Back: no CVA tenderness Pulses 2+ Musculoskeletal: full range of motion, normal strength, no joint deformities Extremities: no clubbing cyanosis or edema, Homan's sign negative  Neurologic: grossly nonfocal; Cranial nerves grossly wnl Psychologic: Normal mood and affect   ECG (independently read by me): NSR at 72; QS V1-4 c/w prior anterior MI: no ectopy; normal intervals  November 2020 ECG (independently read by me): Normal sinus rhythm at 78 bpm.  QS complex V1 through V4 consistent with prior anterior MI.  No ectopy.  QTc interval 389 ms.  Parable '1 7 8 ' ms  December 2019 ECG (independently read by me): Normal sinus rhythm at 75 bpm.  QS complex V1 through V4 consistent with old anterior MI.  Normal intervals.  No ectopy.  November 28, 2017 ECG (independently read by me): Normal sinus rhythm at 83 bpm.  QS complex V1 through V5 consistent with old anterior MI.  Normal intervals.  No ectopy.  March 2019 ECG (independently read by me): Normal  sinus rhythm at 79 bpm.  Old anterolateral infarct changes.  QRS complex V1 through V5; normal intervals.  No significant ST changes.  January 2018 ECG (independently read by me): Normal sinus rhythm at 92 bpm.  Anterior Q waves consistent with his prior anterolateral MI.  September 2017 ECG (independently read by me): Sinus rhythm at 83 bpm.  QRS complex V1-V6  concordant with his large anterolateral MI.  May 2017 ECG (independently read by me): Normal sinus rhythm at 79 bpm.  T-wave inversion V2 through V6 and 1 and aVL concordant with his prior anterior to anterolateral MI.  March 2017 ECG (independently read by me): NSR at 80; QS V1-4 with T wave inversion V1-V6, I and aVL  LABS:  BMP Latest Ref Rng & Units 05/29/2019 01/10/2019 01/09/2019  Glucose 65 - 99 mg/dL 98  97 125(H)  BUN 6 - 24 mg/dL '17 15 12  ' Creatinine 0.76 - 1.27 mg/dL 0.80 0.97 0.97  BUN/Creat Ratio 9 - 20 21(H) - -  Sodium 134 - 144 mmol/L 142 140 141  Potassium 3.5 - 5.2 mmol/L 4.4 3.4(L) 3.8  Chloride 96 - 106 mmol/L 103 102 105  CO2 20 - 29 mmol/L '25 28 27  ' Calcium 8.7 - 10.2 mg/dL 8.8 8.4(L) 8.9     Hepatic Function Latest Ref Rng & Units 05/29/2019 02/07/2018 02/15/2017  Total Protein 6.0 - 8.5 g/dL 6.5 6.3 7.0  Albumin 3.8 - 4.9 g/dL 4.3 4.3 4.7  AST 0 - 40 IU/L '13 15 17  ' ALT 0 - 44 IU/L '14 21 19  ' Alk Phosphatase 39 - 117 IU/L 102 97 92  Total Bilirubin 0.0 - 1.2 mg/dL 1.7(H) 1.3(H) 2.3(H)  Bilirubin, Direct 0.00 - 0.40 mg/dL - - 0.15    CBC Latest Ref Rng & Units 05/29/2019 01/09/2019 11/10/2017  WBC 3.4 - 10.8 x10E3/uL 7.5 6.7 8.9  Hemoglobin 13.0 - 17.7 g/dL 14.3 14.1 13.6  Hematocrit 37.5 - 51.0 % 42.3 42.7 40.9  Platelets 150 - 450 x10E3/uL 168 171 171   Lab Results  Component Value Date   MCV 88 05/29/2019   MCV 91.8 01/09/2019   MCV 90.1 11/10/2017    Lab Results  Component Value Date   TSH 0.577 05/29/2019    BNP    Component Value Date/Time   BNP 14.6 11/09/2017 0356   BNP <2.0 06/26/2010 1334    ProBNP    Component Value Date/Time   PROBNP 46 02/07/2018 1216   PROBNP <2.0 pg/mL 10/17/2009 1834     Lipid Panel     Component Value Date/Time   CHOL 88 (L) 05/29/2019 0955   TRIG 141 05/29/2019 0955   HDL 29 (L) 05/29/2019 0955   CHOLHDL 3.0 05/29/2019 0955   CHOLHDL 3.5 11/09/2017 0356   VLDL 27 11/09/2017 0356   LDLCALC 34 05/29/2019 0955   LDLDIRECT 99.9 09/15/2012 1738     RADIOLOGY: No results found.  IMPRESSION:  1. History of ST elevation myocardial infarction (STEMI):01/15/2015   2. Coronary artery disease involving native coronary artery of native heart without angina pectoris   3. Ischemic cardiomyopathy   4. Essential hypertension   5. LV (left ventricular) mural thrombus: resolved   6. Hyperlipidemia LDL goal <70      ASSESSMENT AND PLAN: Mr. Ulmer Degen is a 54 year old white male who presented to [facility] on the evening of 01/14/2015 with late presentation following an anterolateral MI which most likely occurred the day prior to his presentation.  In the emergency  room, he was pain-free. Cardiac catheterization revealed severe diffuse CAD with ulcerated plaque and thrombus and evidence for spontaneous distal LAD dissection.  He had a very difficult but successful PCI to his LAD, and ultimate insertion of 3 Synergy DES stents covering a total of 96 mm and extending from the ostium to the mid LAD.  He also had PTCA of his distal LAD dissection.  He has done remarkably well since that time and  participated in cardiac rehabilitation.  A echo Doppler showed an EF of 40-45% and apical akinesis.  He had done well without anginal symptomatology until July 2019 when he was found to have very focal 85% in-stent restenosis in the LAD stent at the region of the diagonal vessel with occlusion of the diagonal.  He had excellent collateralization to this diagonal vessel.  He underwent successful cutting balloon.  At his most recent catheterization on November 09, 2017 his LAD stent was patent and there was now both antegrade and extensive retrograde collateralization to the diagonal vessel.  He has been without recurrent anginal symptomatology.  At that time, his left ventriculogram suggested probable apical mural thrombus which on subsequent echo was very suggestive of an early forming thrombus.  As result he was anticoagulation with Eliquis.  His Brilinta was discontinued and he was switched to Plavix and he is no longer been on aspirin.  With his EF of 40 to 45%, I discontinued losartan and switched him to [place].  He tolerated dose titration up to 49/51 mg twice a day.  His symptoms improved.  He was further titrated to maximum dosing and has been on 97/103 m g twice a day.  When seen in August 2020 blood pressure was low  at that time.  At his last evaluation with me in November 2020 Entresto dose was reduced due to mild orthostatic symptoms and low blood pressure.  Blood pressure today is stable without orthostatic change and he continues to tolerate Entresto now at the further reduced dose of 24/26 mg twice a day.  He continues to be on carvedilol 25 mg twice a day.  I reviewed his most recent echo Doppler study which showed an EF of 35 to 40%.  He did have apical dyskinesis without evidence for any residual apical thrombus.  As result I will discontinue Eliquis and have recommended resumption of Plavix plus aspirin.  I will schedule him for P2 Y 12 test to make certain he is Plavix responsive.  He will will use Eliquis until his current dose runs out over the next 30 to 45 days and then will initiate aspirin and Plavix therapy with initial 300 mg bolus followed by 75 mg daily.  He continues to be on atorvastatin 80 mg daily.  I will recheck fasting chemistry, lipid studies, TSH and CBC.   I will see him in 6 months for follow-up evaluation or sooner as necessary.  Troy Sine, MD, [facility]  05/30/2019 9:51 PM

## 2019-05-29 DIAGNOSIS — E785 Hyperlipidemia, unspecified: Secondary | ICD-10-CM | POA: Diagnosis not present

## 2019-05-29 DIAGNOSIS — I255 Ischemic cardiomyopathy: Secondary | ICD-10-CM | POA: Diagnosis not present

## 2019-05-29 DIAGNOSIS — I10 Essential (primary) hypertension: Secondary | ICD-10-CM | POA: Diagnosis not present

## 2019-05-29 DIAGNOSIS — I2510 Atherosclerotic heart disease of native coronary artery without angina pectoris: Secondary | ICD-10-CM | POA: Diagnosis not present

## 2019-05-29 DIAGNOSIS — I513 Intracardiac thrombosis, not elsewhere classified: Secondary | ICD-10-CM | POA: Diagnosis not present

## 2019-05-29 LAB — COMPREHENSIVE METABOLIC PANEL
ALT: 14 IU/L (ref 0–44)
AST: 13 IU/L (ref 0–40)
Albumin/Globulin Ratio: 2 (ref 1.2–2.2)
Albumin: 4.3 g/dL (ref 3.8–4.9)
Alkaline Phosphatase: 102 IU/L (ref 39–117)
BUN/Creatinine Ratio: 21 — ABNORMAL HIGH (ref 9–20)
BUN: 17 mg/dL (ref 6–24)
Bilirubin Total: 1.7 mg/dL — ABNORMAL HIGH (ref 0.0–1.2)
CO2: 25 mmol/L (ref 20–29)
Calcium: 8.8 mg/dL (ref 8.7–10.2)
Chloride: 103 mmol/L (ref 96–106)
Creatinine, Ser: 0.8 mg/dL (ref 0.76–1.27)
GFR calc Af Amer: 118 mL/min/{1.73_m2} (ref >59)
GFR calc non Af Amer: 102 mL/min/{1.73_m2} (ref >59)
Globulin, Total: 2.2 g/dL (ref 1.5–4.5)
Glucose: 98 mg/dL (ref 65–99)
Potassium: 4.4 mmol/L (ref 3.5–5.2)
Sodium: 142 mmol/L (ref 134–144)
Total Protein: 6.5 g/dL (ref 6.0–8.5)

## 2019-05-29 LAB — LIPID PANEL
Chol/HDL Ratio: 3 ratio (ref 0.0–5.0)
Cholesterol, Total: 88 mg/dL — ABNORMAL LOW (ref 100–199)
HDL: 29 mg/dL — ABNORMAL LOW (ref >39)
LDL Chol Calc (NIH): 34 mg/dL (ref 0–99)
Triglycerides: 141 mg/dL (ref 0–149)
VLDL Cholesterol Cal: 25 mg/dL (ref 5–40)

## 2019-05-29 LAB — CBC
Hematocrit: 42.3 % (ref 37.5–51.0)
Hemoglobin: 14.3 g/dL (ref 13.0–17.7)
MCH: 29.7 pg (ref 26.6–33.0)
MCHC: 33.8 g/dL (ref 31.5–35.7)
MCV: 88 fL (ref 79–97)
Platelets: 168 10*3/uL (ref 150–450)
RBC: 4.81 x10E6/uL (ref 4.14–5.80)
RDW: 12.5 % (ref 11.6–15.4)
WBC: 7.5 10*3/uL (ref 3.4–10.8)

## 2019-05-29 LAB — TSH: TSH: 0.577 u[IU]/mL (ref 0.450–4.500)

## 2019-05-30 ENCOUNTER — Encounter: Payer: Self-pay | Admitting: Cardiovascular Disease

## 2019-06-18 ENCOUNTER — Telehealth: Payer: Self-pay

## 2019-06-18 NOTE — Telephone Encounter (Signed)
[email or web address] regarding lab results

## 2019-06-19 ENCOUNTER — Telehealth: Payer: Self-pay

## 2019-06-19 NOTE — Telephone Encounter (Signed)
Apical thrombus has resolved. Patients renal function is normal. He may hold Eliquis 1-2 days prior to procedure.

## 2019-06-19 NOTE — Telephone Encounter (Signed)
[email or web address] to review lab results

## 2019-06-19 NOTE — Telephone Encounter (Signed)
Larene Beach is calling due to still not receiving the clearance form that was faxed over for the patients procedure. Larene Beach is requesting it be filled out and faxed over to the fax number [phone]. Please advise.

## 2019-06-19 NOTE — Telephone Encounter (Addendum)
   Primary Cardiologist: [name], MD  Chart reviewed as part of pre-operative protocol coverage. Patient was just seen in the office on 05/28/19 by [name] who was in the process of adjusting his blood thinner regimen. Per his note, "I reviewed his most recent echo Doppler study which showed an EF of 35 to 40%.  He did have apical dyskinesis without evidence for any residual apical thrombus.  As result I will discontinue Eliquis and have recommended resumption of Plavix plus aspirin.  I will schedule him for P2 Y 12 test to make certain he is Plavix responsive.  He will will use Eliquis until his current dose runs out over the next 30 to 45 days and then will initiate aspirin and Plavix therapy with initial 300 mg bolus followed by 75 mg daily."  I tried to reach out to patient to find out what regimen he is currently on as that will inform decision. LVM to call back. It also looks like Dr. Evette Georges nurse tried to call patient about lab results so need to make sure he ends up calling her back as well (will cc her here in case he does call back).  I will route to [name] for his input on clearing patient for colonoscopy and what he'd like to do with the blood thinners if the patient is still taking Eliquis (I.e. ? Add back aspirin). [name] - Please route response to [facility] PREOP (the pre-op pool). Thank you.  Will forward updated note to requesting party to make them aware we need to speak with patient to clarify medications before providing further clearance recs.   Addendum 06/19/2019 1:58 PM I spoke with [name] personally. He agrees patient is cleared for colonoscopy and suggests that if the patient is still taking Eliquis, the timing of the colonoscopy would be an ideal time to switch over to the antiplatelets. He would suggest that when patient goes off Eliquis, to start aspirin, and then when OK with GI after the colonoscopy to start Plavix 75mg  daily without load. He feels the  patient could be off aspirin too if needed for the procedure if absolutely needed, but would continue if possible. We still need to hear back from the patient what exactly he's taking. If he is on Eliquis, plan to enact plan above. If now on ASA/Plavix he has the clearance he needs to hold Plavix in anticipation of the procedure. Await patient callback. Will also route to pharm for input how long patient needs to hold Eliquis before colonoscopy.   Charlie Pitter, PA-C 06/19/2019, 11:45 AM

## 2019-06-19 NOTE — Telephone Encounter (Signed)
Noted. Tried again but got VM. Await patient callback to clarify. Pre-op team covering later this week - PLEASE SEE ADDENDUM IN RED on my last note.

## 2019-06-20 ENCOUNTER — Telehealth: Payer: Self-pay

## 2019-06-20 DIAGNOSIS — Z1159 Encounter for screening for other viral diseases: Secondary | ICD-10-CM | POA: Diagnosis not present

## 2019-06-20 NOTE — Telephone Encounter (Signed)
Pt came by office, results from lab work reviewed with pt. He would like to know what causes his elevated bilirubin or if he needs to do something differently to fix this. Notified I would let Dr.Kelly review the message and get back with pt with any recommendations.  Pt verbalized understanding with no other questions at this time.

## 2019-06-20 NOTE — Telephone Encounter (Signed)
   Primary Cardiologist: [name], MD  Chart reviewed as part of pre-operative protocol coverage. Given past medical history and time since last visit, based on ACC/AHA guidelines, Cesar Woods would be at acceptable risk for the planned procedure without further cardiovascular testing.   He is currently taking Eliquis but will stop it two days pre op. We prefer the patient continue aspirin through the peri operative period but will defer to the operating surgeon.  Post operatively he will start Plavix 75 mg daily (no 300 mg loading dose) as soon as safe per Dr Penelope Coop. I spoke with the patient today and he understands these instructions.   I will route this recommendation to the requesting party via Epic fax function and remove from pre-op pool.  Please call with questions.  Kerin Ransom, PA-C 06/20/2019, 2:10 PM

## 2019-06-25 DIAGNOSIS — D122 Benign neoplasm of ascending colon: Secondary | ICD-10-CM | POA: Diagnosis not present

## 2019-06-25 DIAGNOSIS — Z86010 Personal history of colonic polyps: Secondary | ICD-10-CM | POA: Diagnosis not present

## 2019-07-31 DIAGNOSIS — R7989 Other specified abnormal findings of blood chemistry: Secondary | ICD-10-CM | POA: Diagnosis not present

## 2019-08-06 ENCOUNTER — Other Ambulatory Visit: Payer: Self-pay | Admitting: Cardiovascular Disease

## 2019-08-20 DIAGNOSIS — R7989 Other specified abnormal findings of blood chemistry: Secondary | ICD-10-CM | POA: Diagnosis not present

## 2019-08-20 DIAGNOSIS — Z8547 Personal history of malignant neoplasm of testis: Secondary | ICD-10-CM | POA: Diagnosis not present

## 2019-08-27 ENCOUNTER — Emergency Department (HOSPITAL_COMMUNITY)
Admission: EM | Admit: 2019-08-27 | Discharge: 2019-08-28 | Disposition: A | Discharge status: Home / Self Care | Payer: BC Managed Care – PPO | Attending: Emergency Medicine | Admitting: Emergency Medicine

## 2019-08-27 ENCOUNTER — Other Ambulatory Visit: Payer: Self-pay

## 2019-08-27 ENCOUNTER — Emergency Department (HOSPITAL_COMMUNITY): Payer: BC Managed Care – PPO

## 2019-08-27 ENCOUNTER — Encounter (HOSPITAL_COMMUNITY): Payer: Self-pay | Admitting: *Deleted

## 2019-08-27 DIAGNOSIS — I110 Hypertensive heart disease with heart failure: Secondary | ICD-10-CM | POA: Insufficient documentation

## 2019-08-27 DIAGNOSIS — R0602 Shortness of breath: Secondary | ICD-10-CM | POA: Diagnosis not present

## 2019-08-27 DIAGNOSIS — I5042 Chronic combined systolic (congestive) and diastolic (congestive) heart failure: Secondary | ICD-10-CM | POA: Insufficient documentation

## 2019-08-27 DIAGNOSIS — Z7982 Long term (current) use of aspirin: Secondary | ICD-10-CM | POA: Insufficient documentation

## 2019-08-27 DIAGNOSIS — Z79899 Other long term (current) drug therapy: Secondary | ICD-10-CM | POA: Insufficient documentation

## 2019-08-27 DIAGNOSIS — R002 Palpitations: Secondary | ICD-10-CM | POA: Diagnosis not present

## 2019-08-27 DIAGNOSIS — I2510 Atherosclerotic heart disease of native coronary artery without angina pectoris: Secondary | ICD-10-CM | POA: Diagnosis not present

## 2019-08-27 LAB — CBC
HCT: 43.5 % (ref 39.0–52.0)
Hemoglobin: 14.2 g/dL (ref 13.0–17.0)
MCH: 29.3 pg (ref 26.0–34.0)
MCHC: 32.6 g/dL (ref 30.0–36.0)
MCV: 89.9 fL (ref 80.0–100.0)
Platelets: 194 10*3/uL (ref 150–400)
RBC: 4.84 MIL/uL (ref 4.22–5.81)
RDW: 12.5 % (ref 11.5–15.5)
WBC: 8.5 10*3/uL (ref 4.0–10.5)
nRBC: 0 % (ref 0.0–0.2)

## 2019-08-27 LAB — BASIC METABOLIC PANEL
Anion gap: 8 (ref 5–15)
BUN: 14 mg/dL (ref 6–20)
CO2: 27 mmol/L (ref 22–32)
Calcium: 8.9 mg/dL (ref 8.9–10.3)
Chloride: 107 mmol/L (ref 98–111)
Creatinine, Ser: 0.87 mg/dL (ref 0.61–1.24)
GFR calc Af Amer: >60 mL/min (ref >60)
GFR calc non Af Amer: >60 mL/min (ref >60)
Glucose, Bld: 139 mg/dL — ABNORMAL HIGH (ref 70–99)
Potassium: 3.8 mmol/L (ref 3.5–5.1)
Sodium: 142 mmol/L (ref 135–145)

## 2019-08-27 LAB — TROPONIN I (HIGH SENSITIVITY): Troponin I (High Sensitivity): 6 ng/L (ref <18)

## 2019-08-27 MED ORDER — SODIUM CHLORIDE 0.9% FLUSH: 3.0000 mL | Freq: Once | INTRAVENOUS | Status: DC

## 2019-08-27 NOTE — ED Triage Notes (Signed)
Pt reports having cardiac hx, having freq palpitations with mild sob. Denies cp. No acute distress is noted at triage.

## 2019-08-28 LAB — TROPONIN I (HIGH SENSITIVITY): Troponin I (High Sensitivity): 6 ng/L (ref <18)

## 2019-08-28 NOTE — ED Notes (Signed)
Pt d/c home per MD order. Discharge summary reviewed with pt- pt verbalizes understanding. Voicing no complaints at discharge. No s/s of acute distress noted. Ambulatory off unit.

## 2019-08-28 NOTE — Discharge Instructions (Addendum)
Continue your medications, follow up with your cardiologist, return to the ED as needed for worsening symptoms

## 2019-08-28 NOTE — ED Provider Notes (Signed)
[facility] EMERGENCY DEPARTMENT Provider Note   CSN: [id number] Arrival date & time: 08/27/19  2115     History Chief Complaint  Patient presents with  . Palpitations  . Shortness of Breath    Cesar Woods is a 54 y.o. male.  HPI   Pt with history of heart disease.  Pt states yesterday he started having palpitations.  His heart felt like it was skipping beats.  It persisted throughout the day.  When he had his heart attack previously he thought it was indigestion and did not come in right away.  He has not had chest pain with this episode of symptoms.  He has been told to not ignore his symptoms so he wanted to make sure everything was OK.  No fevers.  No vomiting or diarrhea.    He has had a few seconds of chest pain intermittently over the last few days.  NOt like his prior MI at all.  Past Medical History:  Diagnosis Date  . ACS (acute coronary syndrome) ([place]) 11/2015  . Atypical chest pain    Negative Myoview 2010  . Coronary artery disease    a.s/p STEMI in 01/2015 requiring placement of 4 DES to LAD. b. 11/2015: NST showing apical scar with minimal peri-infarct ischemia.  . Essential hypertension   . Food allergy    Anaphylaxis with shell fish  . GERD (gastroesophageal reflux disease)   . History of migraines   . Hyperlipidemia   . Ischemic cardiomyopathy    a. Echo 05/2015: EF 40-45%, apical akineiss with Grade 1 DD.  [name] Myocardial infarction ([place]) 2016  . Testicle cancer ([place])    In remission - followed by Dr. Risa Grill; had bilateral orchiectomy and XRT in past    Patient Active Problem List   Diagnosis Date Noted  . Chest pain with moderate risk of acute coronary syndrome 01/09/2019  . Chest pain 11/09/2017  . HLD (hyperlipidemia) 11/09/2017  . Abnormal TSH 08/25/2017  . Progressive angina ([place]) 08/16/2017  . CAD S/P percutaneous coronary angioplasty 08/10/2017  . Ischemic cardiomyopathy 12/10/2015  . Essential hypertension 12/10/2015    . Hyperlipidemia LDL goal <70 06/25/2015  . Acute MI, anterolateral wall, subsequent episode of care ([place]) 04/20/2015  . Chronic combined systolic and diastolic heart failure ([place]) 01/16/2015  . History of ST elevation myocardial infarction (STEMI) 01/14/2015  . Skin lesion 04/07/2012  . GOITER, MULTINODULAR 03/09/2010  . THYROID NODULE, RIGHT 03/06/2010  . DYSPHAGIA UNSPECIFIED 12/22/2009  . History of testicular cancer 10/17/2009  . SHORTNESS OF BREATH 10/17/2009  . ALLERGY, FOOD 03/07/2009  . SKIN TAG 03/07/2009  . PALPITATIONS 08/21/2008  . ALLERGIC RHINITIS 08/12/2008  . COLONIC POLYPS, HX OF 08/12/2008  . Dyslipidemia 07/29/2008  . Hypertensive heart disease 07/29/2008  . GERD 07/29/2008  . CHEST PAIN, ATYPICAL 07/29/2008    Past Surgical History:  Procedure Laterality Date  . CARDIAC CATHETERIZATION N/A 01/15/2015   Procedure: Left Heart Cath and Coronary Angiography;  Surgeon: Troy Sine, MD;  Location: [place] [facility];  Service: Cardiovascular;  Laterality: N/A;  . CARDIAC CATHETERIZATION  01/15/2015   Procedure: Coronary Stent Intervention;  Surgeon: Troy Sine, MD;  Location: [place] [facility];  Service: Cardiovascular;;  . CORONARY BALLOON ANGIOPLASTY N/A 08/17/2017   Procedure: CORONARY BALLOON ANGIOPLASTY;  Surgeon: Leonie Man, MD;  Location: [place] [facility];  Service: Cardiovascular;  Laterality: N/A;  . LEFT HEART CATH AND CORONARY ANGIOGRAPHY N/A 08/17/2017   Procedure:  LEFT HEART CATH AND CORONARY ANGIOGRAPHY;  Surgeon: Leonie Man, MD;  Location: [place] [facility];  Service: Cardiovascular;  Laterality: N/A;  . LEFT HEART CATH AND CORONARY ANGIOGRAPHY N/A 11/09/2017   Procedure: LEFT HEART CATH AND CORONARY ANGIOGRAPHY;  Surgeon: Troy Sine, MD;  Location: [place] [facility];  Service: Cardiovascular;  Laterality: N/A;  . ORCHIECTOMY Bilateral        Family History  Problem Relation Age of Onset  . Heart attack Father         CABG at agae 71  . Stroke Paternal Grandfather   . Hypertension Other   . Hyperlipidemia Other   . Heart attack Maternal Grandmother     Social History   Tobacco Use  . Smoking status: Former Research scientist (life sciences)  . Smokeless tobacco: Never Used  . Tobacco comment: quit around 2010  Vaping Use  . Vaping Use: Never used  Substance Use Topics  . Alcohol use: Yes    Alcohol/week: 0.0 standard drinks    Comment: occasionally  . Drug use: No    Comment: remote use of marijuana, quit a long time ago    Home Medications Prior to Admission medications   Medication Sig Start Date End Date Taking? Authorizing Provider  apixaban (ELIQUIS) 5 MG TABS tablet TAKE 1 TABLET(5 MG) BY MOUTH TWICE DAILY 04/20/19   Troy Sine, MD  aspirin EC 81 MG tablet Take 1 tablet (81 mg total) by mouth daily. Patient taking differently: Take 81 mg by mouth at bedtime.  01/03/19   Troy Sine, MD  atorvastatin (LIPITOR) 80 MG tablet TAKE 1 TABLET BY MOUTH DAILY AT 6 PM 08/06/19   Lendon Colonel, NP  carvedilol (COREG) 25 MG tablet TAKE 1 TABLET(25 MG) BY MOUTH TWICE DAILY WITH A MEAL 05/04/19   Troy Sine, MD  clopidogrel (PLAVIX) 75 MG tablet Take 1 tablet (75 mg total) by mouth daily. 05/28/19   Troy Sine, MD  ENTRESTO 24-26 MG TAKE 1 TABLET BY MOUTH TWICE DAILY 04/10/19   Kroeger, Daleen Snook M., PA-C  EPIPEN 2-PAK 0.3 MG/0.3ML SOAJ injection INJECT 0.3 MLS INTO THE MUSCLE ONCE **PATIENT NEEDS TO SCHEDULE AN OFFICE VISIT** Patient taking differently: Inject 0.3 mg into the muscle daily as needed (allergic reaction).  06/12/13   Shawna Orleans, Doe-Hyun R, DO  nitroGLYCERIN (NITROSTAT) 0.4 MG SL tablet PLACE 1 TABLET BY MOUTH UNDER THE TONGUE EVERY 5 MINUTES FOR 3 DOSES AS NEEDED FOR CHEST PAIN Patient taking differently: Place 0.4 mg under the tongue every 5 (five) minutes as needed for chest pain.  09/11/18   Troy Sine, MD  omeprazole (PRILOSEC) 20 MG capsule Take 20 mg by mouth at bedtime.     [provider]  Testosterone 1.62 % GEL SMARTSIG:4 Pump Topical Daily 05/04/19   [provider]    Allergies    Iodine, Shellfish allergy, Penicillins, and Ace inhibitors  Review of Systems   Review of Systems  All other systems reviewed and are negative.   Physical Exam Updated Vital Signs BP 112/81   Pulse 69   Temp 98.1 F (36.7 C) (Oral)   Resp 18   SpO2 96%   Physical Exam Vitals and nursing note reviewed.  Constitutional:      General: He is not in acute distress.    Appearance: He is well-developed.  HENT:     Head: Normocephalic and atraumatic.     Right Ear: External ear normal.  Left Ear: External ear normal.  Eyes:     General: No scleral icterus.       Right eye: No discharge.        Left eye: No discharge.     Conjunctiva/sclera: Conjunctivae normal.  Neck:     Trachea: No tracheal deviation.  Cardiovascular:     Rate and Rhythm: Normal rate and regular rhythm.  Pulmonary:     Effort: Pulmonary effort is normal. No respiratory distress.     Breath sounds: Normal breath sounds. No stridor. No wheezing or rales.  Abdominal:     General: Bowel sounds are normal. There is no distension.     Palpations: Abdomen is soft.     Tenderness: There is no abdominal tenderness. There is no guarding or rebound.  Musculoskeletal:        General: No tenderness.     Cervical back: Neck supple.  Skin:    General: Skin is warm and dry.     Findings: No rash.  Neurological:     Mental Status: He is alert.     Cranial Nerves: No cranial nerve deficit (no facial droop, extraocular movements intact, no slurred speech).     Sensory: No sensory deficit.     Motor: No abnormal muscle tone or seizure activity.     Coordination: Coordination normal.     ED Results / Procedures / Treatments   Labs (all labs ordered are listed, but only abnormal results are displayed) Labs Reviewed  BASIC METABOLIC PANEL - Abnormal; Notable for the following components:      Result  Value   Glucose, Bld 139 (*)    All other components within normal limits  CBC  TROPONIN I (HIGH SENSITIVITY)  TROPONIN I (HIGH SENSITIVITY)    EKG EKG Interpretation  Date/Time:  Monday August 27 2019 21:32:47 EDT Ventricular Rate:  84 PR Interval:  176 QRS Duration: 84 QT Interval:  260 QTC Calculation: 307 R Axis:   -10 Text Interpretation: Normal sinus rhythm Anteroseptal infarct , age undetermined Abnormal ECG No significant change since last tracing Reconfirmed by Dorie Rank [id number]) on 08/28/2019 7:59:23 AM   Radiology DG Chest 2 View  Result Date: 08/27/2019 CLINICAL DATA:  Shortness of breath, palpitations. EXAM: CHEST - 2 VIEW COMPARISON:  Prior chest radiographs 01/09/2019 and earlier. FINDINGS: Heart size within normal limits. Redemonstrated coronary artery stent. No appreciable airspace consolidation or pulmonary edema. No evidence of pleural effusion or pneumothorax. No acute bony abnormality. IMPRESSION: No evidence of acute cardiopulmonary abnormality. Electronically Signed   By: Kellie Simmering DO   On: 08/27/2019 21:52    Procedures Procedures (including critical care time)  Medications Ordered in ED Medications  sodium chloride flush (NS) 0.9 % injection 3 mL (has no administration in time range)    ED Course  I have reviewed the triage vital signs and the nursing notes.  Pertinent labs & imaging results that were available during my care of the patient were reviewed by me and considered in my medical decision making (see chart for details).    MDM Rules/Calculators/A&P                          Patient presented to the ED for evaluation of palpitations.  Patient has known history of heart disease with prior cardial infarction.  Patient also states he has had issues with palpitations in the past and at one time on heart monitor.  Patient is not  having any chest pain.  He is feeling well.  ED work-up is reassuring.  Unfortunately the patient had to wait many  hours in the waiting room prior to evaluation.  However this did provide prolonged observation time.  No acute abnormalities noted on labs.  Serial troponin normal.  No dysrhythmia noted on EKG or monitor.  Doubt symptoms are related to acute coronary syndrome.  Patient has not had any syncope associated with these palpitations.  I feel he stable for outpatient follow-up with his cardiologist. Final Clinical Impression(s) / ED Diagnoses Final diagnoses:  Palpitations    Rx / DC Orders ED Discharge Orders    None       Dorie Rank, MD 08/28/19 [phone]

## 2019-09-10 ENCOUNTER — Encounter (HOSPITAL_COMMUNITY): Payer: Self-pay | Admitting: Emergency Medicine

## 2019-09-10 ENCOUNTER — Emergency Department (HOSPITAL_COMMUNITY): Payer: BC Managed Care – PPO

## 2019-09-10 ENCOUNTER — Other Ambulatory Visit: Payer: Self-pay

## 2019-09-10 ENCOUNTER — Emergency Department (HOSPITAL_COMMUNITY)
Admission: EM | Admit: 2019-09-10 | Discharge: 2019-09-10 | Disposition: A | Discharge status: Home / Self Care | Payer: BC Managed Care – PPO | Attending: Emergency Medicine | Admitting: Emergency Medicine

## 2019-09-10 ENCOUNTER — Telehealth: Payer: Self-pay | Admitting: Cardiovascular Disease

## 2019-09-10 DIAGNOSIS — Z79899 Other long term (current) drug therapy: Secondary | ICD-10-CM | POA: Insufficient documentation

## 2019-09-10 DIAGNOSIS — R002 Palpitations: Secondary | ICD-10-CM | POA: Diagnosis not present

## 2019-09-10 DIAGNOSIS — I5042 Chronic combined systolic (congestive) and diastolic (congestive) heart failure: Secondary | ICD-10-CM | POA: Diagnosis not present

## 2019-09-10 DIAGNOSIS — R0789 Other chest pain: Secondary | ICD-10-CM | POA: Diagnosis not present

## 2019-09-10 DIAGNOSIS — I110 Hypertensive heart disease with heart failure: Secondary | ICD-10-CM | POA: Insufficient documentation

## 2019-09-10 DIAGNOSIS — I2510 Atherosclerotic heart disease of native coronary artery without angina pectoris: Secondary | ICD-10-CM | POA: Diagnosis not present

## 2019-09-10 DIAGNOSIS — R072 Precordial pain: Secondary | ICD-10-CM | POA: Diagnosis not present

## 2019-09-10 DIAGNOSIS — R079 Chest pain, unspecified: Secondary | ICD-10-CM

## 2019-09-10 DIAGNOSIS — R0602 Shortness of breath: Secondary | ICD-10-CM | POA: Diagnosis not present

## 2019-09-10 DIAGNOSIS — Z87891 Personal history of nicotine dependence: Secondary | ICD-10-CM | POA: Diagnosis not present

## 2019-09-10 LAB — CBC
HCT: 44 % (ref 39.0–52.0)
Hemoglobin: 14.7 g/dL (ref 13.0–17.0)
MCH: 29.8 pg (ref 26.0–34.0)
MCHC: 33.4 g/dL (ref 30.0–36.0)
MCV: 89.1 fL (ref 80.0–100.0)
Platelets: 193 10*3/uL (ref 150–400)
RBC: 4.94 MIL/uL (ref 4.22–5.81)
RDW: 12.3 % (ref 11.5–15.5)
WBC: 8.4 10*3/uL (ref 4.0–10.5)
nRBC: 0 % (ref 0.0–0.2)

## 2019-09-10 LAB — BASIC METABOLIC PANEL
Anion gap: 9 (ref 5–15)
BUN: 13 mg/dL (ref 6–20)
CO2: 26 mmol/L (ref 22–32)
Calcium: 8.8 mg/dL — ABNORMAL LOW (ref 8.9–10.3)
Chloride: 103 mmol/L (ref 98–111)
Creatinine, Ser: 0.91 mg/dL (ref 0.61–1.24)
GFR calc Af Amer: >60 mL/min (ref >60)
GFR calc non Af Amer: >60 mL/min (ref >60)
Glucose, Bld: 122 mg/dL — ABNORMAL HIGH (ref 70–99)
Potassium: 3.9 mmol/L (ref 3.5–5.1)
Sodium: 138 mmol/L (ref 135–145)

## 2019-09-10 LAB — TROPONIN I (HIGH SENSITIVITY)
Troponin I (High Sensitivity): 6 ng/L (ref <18)
Troponin I (High Sensitivity): 7 ng/L (ref <18)

## 2019-09-10 LAB — BRAIN NATRIURETIC PEPTIDE: B Natriuretic Peptide: 24.4 pg/mL (ref 0.0–100.0)

## 2019-09-10 MED ORDER — SODIUM CHLORIDE 0.9% FLUSH: 3.0000 mL | Freq: Once | INTRAVENOUS | Status: DC

## 2019-09-10 NOTE — Discharge Instructions (Signed)
Your labwork and imaging were very reassuring today. Please follow up with [name] tomorrow morning to schedule an outpatient appointment to discuss your palpitations.   Return to the ED IMMEDIATELY for any worsening symptoms including worsening chest pain/shortness of breath, coughing up blood, passing out, or any other new/concerning symptoms.

## 2019-09-10 NOTE — Telephone Encounter (Signed)
Patient c/o Palpitations:  High priority if patient c/o lightheadedness, shortness of breath, or chest pain  1) How long have you had palpitations/irregular HR/ Afib? Are you having the symptoms now? Come and go ~ every hour   2) Are you currently experiencing lightheadedness, SOB or CP? Mild chest discomfort not related to palpitations that comes and goes, mild SOB that also comes and goes   3) Do you have a history of afib (atrial fibrillation) or irregular heart rhythm? yes  4) Have you checked your BP or HR? (document readings if available): 135/87 HR 81  5) Are you experiencing any other symptoms? No  Pt c/o Shortness Of Breath: STAT if SOB developed within the last 24 hours or pt is noticeably SOB on the phone  1. Are you currently SOB (can you hear that pt is SOB on the phone)? No   2. How long have you been experiencing SOB? Since hospital visit ~2 weeks ago  3. Are you SOB when sitting or when up moving around? Not all the time, but also not related to activity   4. Are you currently experiencing any other symptoms?

## 2019-09-10 NOTE — ED Triage Notes (Signed)
Pt in w/tight, central cp ongoing x 2 wks. Came to ED for eval then, but pain got worse last night and persists today, radiates to back. Denies any n/v, cough or fever. Called his doc, and they wanted ED eval

## 2019-09-10 NOTE — Telephone Encounter (Signed)
Called and spoke with pt he reports that it started with his palpitations. He reports that normally they would resolve and currently it has not. Pt is currently SOB with palpitations and is "not feeling great". He reports a little discomfort in his chest. He reports he is very concerned. Notified that we do not have any OV today and that I would try to see if the AFIB clinic had any appts.  Called afib clinic. Notified that there were no appts.  Called pt back, notified that the AFIB clinic does not have any available appts today and that with his symptoms we suggest he go to the ED to be evaluated. Notified that I would call ahead and let them know he was coming. Pt verbalized understanding and was agreeable to go to the ED. Called and left Trish with ED a message that pt was coming.

## 2019-09-10 NOTE — ED Provider Notes (Signed)
[facility] EMER[facility]ENC[facility] DEPARTMENT Provider Note   CSN: [id number] Arrival date & time: 09/10/19  1125     History Chief Complaint  Patient presents with  . Chest Pain    Cesar Woods is a 54 y.o. male with PMHx CAD, MI, Ischemic cardiomyopathy with ED 40-45%, HLD who presents to the ED today with complaint of intermittent palpitations x 3 weeks. Pt also complains of intermittent substernal chest pain x 2 weeks with associated shortness of breath. Pt states he was seen in the ED 2 weeks ago for same; he called his cardiologist [name] today for follow up. It appears there were no open appointments today so patient was told to come to the ED for further evaluation. He is not experiencing the palpitations or chest pain currently. Pt reports when he had his MI in the past he had what he describes as "indigestion" however he denies this feeling recently. Denies fevers, chills, diaphoresis, nausea, vomiting, or any other associated symptoms.   Heart Cath 11/09/2017  Non-stenotic Prox LAD lesion was previously treated.  Non-stenotic Mid LAD lesion was previously treated.  Ost 2nd Diag lesion is 40% stenosed.  Ramus lesion is 40% stenosed.  Ost 1st Mrg-1 lesion is 30% stenosed.  Ost 1st Mrg-2 lesion is 35% stenosed.  Mid RCA lesion is 20% stenosed.  Prox LAD to Mid LAD lesion is 20% stenosed.  LV end diastolic pressure is mildly elevated.  There is mild to moderate left ventricular systolic dysfunction.   The history is provided by the patient and medical records.       Past Medical History:  Diagnosis Date  . ACS (acute coronary syndrome) ([place]) 11/2015  . Atypical chest pain    Negative Myoview 2010  . Coronary artery disease    a.s/p STEMI in 01/2015 requiring placement of 4 DES to LAD. b. 11/2015: NST showing apical scar with minimal peri-infarct ischemia.  . Essential hypertension   . Food allergy    Anaphylaxis with shell fish  . GERD  (gastroesophageal reflux disease)   . History of migraines   . Hyperlipidemia   . Ischemic cardiomyopathy    a. Echo 05/2015: EF 40-45%, apical akineiss with Grade 1 DD.  [name] Myocardial infarction ([place]) 2016  . Testicle cancer ([place])    In remission - followed by Dr. Risa Grill; had bilateral orchiectomy and XRT in past    Patient Active Problem List   Diagnosis Date Noted  . Chest pain with moderate risk of acute coronary syndrome 01/09/2019  . Chest pain 11/09/2017  . HLD (hyperlipidemia) 11/09/2017  . Abnormal TSH 08/25/2017  . Progressive angina ([place]) 08/16/2017  . CAD S/P percutaneous coronary angioplasty 08/10/2017  . Ischemic cardiomyopathy 12/10/2015  . Essential hypertension 12/10/2015  . Hyperlipidemia LDL goal <70 06/25/2015  . Acute MI, anterolateral wall, subsequent episode of care ([place]) 04/20/2015  . Chronic combined systolic and diastolic heart failure ([place]) 01/16/2015  . History of ST elevation myocardial infarction (STEMI) 01/14/2015  . Skin lesion 04/07/2012  . GOITER, MULTINODULAR 03/09/2010  . THYROID NODULE, RIGHT 03/06/2010  . DYSPHAGIA UNSPECIFIED 12/22/2009  . History of testicular cancer 10/17/2009  . SHORTNESS OF BREATH 10/17/2009  . ALLERGY, FOOD 03/07/2009  . SKIN TAG 03/07/2009  . PALPITATIONS 08/21/2008  . ALLERGIC RHINITIS 08/12/2008  . COLONIC POLYPS, HX OF 08/12/2008  . Dyslipidemia 07/29/2008  . Hypertensive heart disease 07/29/2008  . GERD 07/29/2008  . CHEST PAIN, ATYPICAL 07/29/2008    Past Surgical History:  Procedure Laterality Date  . CARDIAC CATHETERIZATION N/A 01/15/2015   Procedure: Left Heart Cath and Coronary Angiography;  Surgeon: Troy Sine, MD;  Location: [facility];  Service: Cardiovascular;  Laterality: N/A;  . CARDIAC CATHETERIZATION  01/15/2015   Procedure: Coronary Stent Intervention;  Surgeon: Troy Sine, MD;  Location: [facility];  Service: Cardiovascular;;  . CORONARY BALLOON ANGIOPLASTY N/A  08/17/2017   Procedure: CORONARY BALLOON ANGIOPLASTY;  Surgeon: Leonie Man, MD;  Location: [facility];  Service: Cardiovascular;  Laterality: N/A;  . LEFT HEART CATH AND CORONARY ANGIOGRAPHY N/A 08/17/2017   Procedure: LEFT HEART CATH AND CORONARY ANGIOGRAPHY;  Surgeon: Leonie Man, MD;  Location: [facility];  Service: Cardiovascular;  Laterality: N/A;  . LEFT HEART CATH AND CORONARY ANGIOGRAPHY N/A 11/09/2017   Procedure: LEFT HEART CATH AND CORONARY ANGIOGRAPHY;  Surgeon: Troy Sine, MD;  Location: [facility];  Service: Cardiovascular;  Laterality: N/A;  . ORCHIECTOMY Bilateral        Family History  Problem Relation Age of Onset  . Heart attack Father        CABG at agae 11  . Stroke Paternal Grandfather   . Hypertension Other   . Hyperlipidemia Other   . Heart attack Maternal Grandmother     Social History   Tobacco Use  . Smoking status: Former Research scientist (life sciences)  . Smokeless tobacco: Never Used  . Tobacco comment: quit around 2010  Vaping Use  . Vaping Use: Never used  Substance Use Topics  . Alcohol use: Yes    Alcohol/week: 0.0 standard drinks    Comment: occasionally  . Drug use: No    Comment: remote use of marijuana, quit a long time ago    Home Medications Prior to Admission medications   Medication Sig Start Date End Date Taking? Authorizing Provider  aspirin EC 81 MG tablet Take 1 tablet (81 mg total) by mouth daily. Patient taking differently: Take 81 mg by mouth at bedtime.  01/03/19  Yes Troy Sine, MD  atorvastatin (LIPITOR) 80 MG tablet TAKE 1 TABLET BY MOUTH DAILY AT 6 PM Patient taking differently: Take 80 mg by mouth daily. TAKE 1 TABLET BY MOUTH DAILY AT 6 PM 08/06/19  Yes Lendon Colonel, NP  carvedilol (COREG) 25 MG tablet TAKE 1 TABLET(25 MG) BY MOUTH TWICE DAILY WITH A MEAL Patient taking differently: Take 25 mg by mouth 2 (two) times daily with a meal.  05/04/19  Yes Troy Sine, MD  clopidogrel (PLAVIX) 75  MG tablet Take 1 tablet (75 mg total) by mouth daily. 05/28/19  Yes Troy Sine, MD  ENTRESTO 24-26 MG TAKE 1 TABLET BY MOUTH TWICE DAILY 04/10/19  Yes Kroeger, Krista M., PA-C  EPIPEN 2-PAK 0.3 MG/0.3ML SOAJ injection INJECT 0.3 MLS INTO THE MUSCLE ONCE **PATIENT NEEDS TO SCHEDULE AN OFFICE VISIT** Patient taking differently: Inject 0.3 mg into the muscle as needed (for an allergic reaction).  06/12/13  Yes Yoo, Doe-Hyun R, DO  nitroGLYCERIN (NITROSTAT) 0.4 MG SL tablet PLACE 1 TABLET BY MOUTH UNDER THE TONGUE EVERY 5 MINUTES FOR 3 DOSES AS NEEDED FOR CHEST PAIN Patient taking differently: Place 0.4 mg under the tongue every 5 (five) minutes as needed for chest pain.  09/11/18  Yes Troy Sine, MD  omeprazole (PRILOSEC) 20 MG capsule Take 20 mg by mouth at bedtime.    Yes [provider]  Testosterone 1.62 % GEL Apply 4  Pump topically daily.  05/04/19  Yes [provider]  apixaban (ELIQUIS) 5 MG TABS tablet TAKE 1 TABLET(5 MG) BY MOUTH TWICE DAILY Patient not taking: Reported on 09/10/2019 04/20/19   Troy Sine, MD    Allergies    Iodine, Shellfish allergy, Penicillins, and Ace inhibitors  Review of Systems   Review of Systems  Constitutional: Negative for chills and fever.  Respiratory: Positive for shortness of breath.   Cardiovascular: Positive for chest pain and palpitations.  Gastrointestinal: Negative for nausea and vomiting.  All other systems reviewed and are negative.   Physical Exam Updated Vital Signs BP 122/87 (BP Location: Right Arm)   Pulse 84   Temp 98.3 F (36.8 C) (Oral)   Resp 18   Ht 6' (1.829 m)   Wt 92.7 kg   SpO2 97%   BMI 27.72 kg/m   Physical Exam Vitals and nursing note reviewed.  Constitutional:      Appearance: He is not ill-appearing or diaphoretic.  HENT:     Head: Normocephalic and atraumatic.  Eyes:     Conjunctiva/sclera: Conjunctivae normal.  Cardiovascular:     Rate and Rhythm: Normal rate and regular rhythm.      Pulses:          Radial pulses are 2+ on the right side and 2+ on the left side.       Dorsalis pedis pulses are 2+ on the right side and 2+ on the left side.     Heart sounds: Normal heart sounds.  Pulmonary:     Effort: Pulmonary effort is normal.     Breath sounds: Normal breath sounds. No decreased breath sounds, wheezing, rhonchi or rales.  Chest:     Chest wall: No tenderness.  Abdominal:     Palpations: Abdomen is soft.     Tenderness: There is no abdominal tenderness. There is no guarding or rebound.  Musculoskeletal:     Cervical back: Neck supple.     Right lower leg: No edema.     Left lower leg: No edema.  Skin:    General: Skin is warm and dry.  Neurological:     Mental Status: He is alert.     ED Results / Procedures / Treatments   Labs (all labs ordered are listed, but only abnormal results are displayed) Labs Reviewed  BASIC METABOLIC PANEL - Abnormal; Notable for the following components:      Result Value   Glucose, Bld 122 (*)    Calcium 8.8 (*)    All other components within normal limits  CBC  BRAIN NATRIURETIC PEPTIDE  TROPONIN I (HIGH SENSITIVITY)  TROPONIN I (HIGH SENSITIVITY)    EKG EKG Interpretation  Date/Time:  Monday September 10 2019 11:47:17 EDT Ventricular Rate:  83 PR Interval:  162 QRS Duration: 84 QT Interval:  332 QTC Calculation: 390 R Axis:   28 Text Interpretation: Normal sinus rhythm Anterolateral infarct , age undetermined Abnormal ECG No significant change since last tracing Confirmed by Isla Pence [phone]) on 09/10/2019 9:08:42 PM   Radiology DG Chest 2 View  Result Date: 09/10/2019 CLINICAL DATA:  Midsternal chest pain, heart palpitations and shortness of breath. EXAM: CHEST - 2 VIEW COMPARISON:  PA and lateral chest 08/27/2019. FINDINGS: Lungs clear. Heart size normal. No pneumothorax or pleural effusion. Coronary artery stent noted. IMPRESSION: No acute disease. Electronically Signed   By: Inge Rise M.D.   On:  09/10/2019 12:05    Procedures Procedures (including critical care  time)  Medications Ordered in ED Medications  sodium chloride flush (NS) 0.9 % injection 3 mL (3 mLs Intravenous Not Given 09/10/19 2115)    ED Course  I have reviewed the triage vital signs and the nursing notes.  Pertinent labs & imaging results that were available during my care of the patient were reviewed by me and considered in my medical decision making (see chart for details).    MDM Rules/Calculators/A&P                          54 year old male with a history of MI presents the ED today with complaint of intermittent palpitations for the past 3 weeks with substernal chest pain for the past 2 weeks.  Has already been evaluated in the ED on 07/20 with negative cardiac work-up, advised to follow-up with PCP.  Attempted to call Dr. Evette Georges office today and was told to come to the ED for further evaluation as there were no open appointments.  Arrival to the ED patient is afebrile, nontachycardic and nontachypneic.  He sat in the waiting room for several hours.  Lab work was initiated.  EKG without acute ischemic changes.  CBC without leukocytosis.  Hemoglobin stable at 14.7.  BMP without electrolyte abnormalities.  Initial troponin of 6.  Chest x-ray clear.   Patient brought back to hallway bed and appears to be in no acute distress.  He is sitting upright and joking.  He does not have any any active chest pain or palpitations at this time.  Per chart review patient does have an EF of 40 to 45% with history of ischemic cardiomyopathy.  Will obtain BNP at this time and repeat troponin.  If no acute changes we will plan to discharge home with cardiology follow-up.  It does appear they are planning to have him wear a Holter monitor to assess these palpitations that he has been having. Pt is on Plavix and eliquis and denies missing any doses; doubt PE.   Repeat troponin of 7.  BNP 24.4 Labwork overall reassuring; not  indicative of ACS or CHF exacerbation. Will discharge patient at this time with plans for close cardiology follow up this week. He is instructed to return to the ED immediately for any worsening symptoms. He is in agreement with plan and stable for discharge home.   This note was prepared using Dragon voice recognition software and may include unintentional dictation errors due to the inherent limitations of voice recognition software.  Final Clinical Impression(s) / ED Diagnoses Final diagnoses:  Nonspecific chest pain  Palpitations    Rx / DC Orders ED Discharge Orders    None       Discharge Instructions     Your labwork and imaging were very reassuring today. Please follow up with [name] tomorrow morning to schedule an outpatient appointment to discuss your palpitations.   Return to the ED IMMEDIATELY for any worsening symptoms including worsening chest pain/shortness of breath, coughing up blood, passing out, or any other new/concerning symptoms.        Eustaquio Maize, PA-C 09/10/19 2225    Isla Pence, MD 09/10/19 2252

## 2019-09-11 ENCOUNTER — Other Ambulatory Visit: Payer: Self-pay

## 2019-09-11 DIAGNOSIS — R002 Palpitations: Secondary | ICD-10-CM

## 2019-09-11 NOTE — Telephone Encounter (Addendum)
Pt calling in regarding phone call from yesterday. Pt states that he went to the ED for 11 hours yesterday, they drew troponins and checked for fluid buildup around his heart. He reports they found nothing. They did an ekg and it showed NSR. Pt asked if they could do a heart monitor but he was told they wouldn't be able to do this unless he was admitted.  Attempted to find an appt for pt but nothing available today.   Called AFIB clinic to see if they had any available appts. Dawn with afib clinic had one of their providers review the ED notes and EKG and stated that since there was no diagnosis of AFIB they couldn't do anything for the pt.  Spoke with DOD Dr.Acharya who reviewed his notes and stated we could order a 30 day event monitor. She advised that he really document his symptoms when having episodes.  Called pt back and notified of Dr.Acharya's recommendations. Pt agreeable and verbalized understanding. Notified if he had any other issues to notify our office. Recommended he keep his office visit with Almyra Deforest PA on 09/24/19 at 11:45am. Pt verbalized understanding. [place] this weekend but he will postpone this trip for now.  Will place order 30day event monitor.

## 2019-09-24 ENCOUNTER — Ambulatory Visit (INDEPENDENT_AMBULATORY_CARE_PROVIDER_SITE_OTHER): Payer: BC Managed Care – PPO | Admitting: Physician Assistant

## 2019-09-24 ENCOUNTER — Encounter: Payer: Self-pay | Admitting: Physician Assistant

## 2019-09-24 VITALS — BP 100/74 | HR 78 | Ht 72.0 in | Wt 210.0 lb

## 2019-09-24 DIAGNOSIS — I255 Ischemic cardiomyopathy: Secondary | ICD-10-CM | POA: Diagnosis not present

## 2019-09-24 DIAGNOSIS — Z79899 Other long term (current) drug therapy: Secondary | ICD-10-CM | POA: Diagnosis not present

## 2019-09-24 DIAGNOSIS — I2510 Atherosclerotic heart disease of native coronary artery without angina pectoris: Secondary | ICD-10-CM | POA: Diagnosis not present

## 2019-09-24 DIAGNOSIS — I513 Intracardiac thrombosis, not elsewhere classified: Secondary | ICD-10-CM | POA: Diagnosis not present

## 2019-09-24 DIAGNOSIS — I10 Essential (primary) hypertension: Secondary | ICD-10-CM

## 2019-09-24 DIAGNOSIS — Z8547 Personal history of malignant neoplasm of testis: Secondary | ICD-10-CM

## 2019-09-24 DIAGNOSIS — E785 Hyperlipidemia, unspecified: Secondary | ICD-10-CM

## 2019-09-24 DIAGNOSIS — R002 Palpitations: Secondary | ICD-10-CM

## 2019-09-24 NOTE — Patient Instructions (Signed)
Medication Instructions:  Your physician recommends that you continue on your current medications as directed. Please refer to the Current Medication list given to you today.  *If you need a refill on your cardiac medications before your next appointment, please call your pharmacy*  Lab Work: Your physician recommends that you return for lab work in 3-4 weeks:    Platelet Inhibition (p2y12) If you have labs (blood work) drawn today and your tests are completely normal, you will receive your results only by: [name] MyChart Message (if you have MyChart) OR . A paper copy in the mail If you have any lab test that is abnormal or we need to change your treatment, we will call you to review the results.  Testing/Procedures: NONE ordered at this time of appointment   Follow-Up: At [facility], you and your health needs are our priority.  As part of our continuing mission to provide you with exceptional heart care, we have created designated Provider Care Teams.  These Care Teams include your primary Cardiologist (physician) and Advanced Practice Providers (APPs -  Physician Assistants and Nurse Practitioners) who all work together to provide you with the care you need, when you need it.  Your next appointment:   Follow up as scheduled  12/28/2019 at 4PM  The format for your next appointment:   In Person  Provider:   [name], MD  Other Instructions

## 2019-09-24 NOTE — Progress Notes (Signed)
Cardiology Office Note:    Date:  09/26/2019   ID:  [name], DOB [birth year], MRN [id number]  PCP:  Delilah Shan, MD  [facility] Cardiologist:  [name], MD  [place] Electrophysiologist:  None   Referring MD: Delilah Shan, MD   Chief Complaint  Patient presents with  . Follow-up    seen for [name]    History of Present Illness:    Cesar Woods is a 54 y.o. male with a hx of CAD, ICM, LV thrombus, hypertension, hyperlipidemia, GERD, and history of testicular cancer s/p bilateral orchiectomy and radiation therapy.  Patient had a STEMI in 2016 with DES to LAD with 3 overlapping drug-eluting stents.  Unfortunately he had in-stent restenosis require PCI in July 2019.  He also has a history of ischemic cardiomyopathy with apical mural thrombus on Eliquis.  Echocardiogram obtained in March 2020 showed EF 40 to 45% with septal/apical akinesis, mild LAE and aortic calcification.  Myoview in December 2020 showed no ischemia, large apical infarct similar to the previous study.  Due to episodes of dizziness and presyncope, previous Entresto was decreased.  Repeat echocardiogram obtained on 04/30/2019 showed EF 35 to 40%, severe akinesis of the left ventricular, apical segment, anteroseptal wall and inferolateral wall, no significant valve issue, resolution of LV thrombus.  He was last seen by [name] in April 2021, due to lack of LV thrombus, Eliquis was discontinued.  He was restarted on Plavix plus aspirin.  Since the last office visit, patient has went to the ED twice recently.  He went to the ED on 08/28/2019 for evaluation of skipped heartbeat.  Work-up was reassuring.  Troponin was normal.   He continued to have 8-10 palpitations per day.  He describes the palpitations as a single skipped heartbeat before resuming normal.  Otherwise he denies any dizziness, blurred vision, or feeling of passing out.  He denies any recent chest pain.  I recommended to proceed with  P2Y12 study.  Otherwise he can continue the current therapy.  We will wait for the 30-day event monitor that was ordered recently.  He will keep the current follow-up with [name].   Past Medical History:  Diagnosis Date  . ACS (acute coronary syndrome) ([place]) 11/2015  . Atypical chest pain    Negative Myoview 2010  . Coronary artery disease    a.s/p STEMI in 01/2015 requiring placement of 4 DES to LAD. b. 11/2015: NST showing apical scar with minimal peri-infarct ischemia.  . Essential hypertension   . Food allergy    Anaphylaxis with shell fish  . GERD (gastroesophageal reflux disease)   . History of migraines   . Hyperlipidemia   . Ischemic cardiomyopathy    a. Echo 05/2015: EF 40-45%, apical akineiss with Grade 1 DD.  [name] Myocardial infarction ([place]) 2016  . Testicle cancer ([name])    In remission - followed by Dr. Risa Grill; had bilateral orchiectomy and XRT in past    Past Surgical History:  Procedure Laterality Date  . CARDIAC CATHETERIZATION N/A 01/15/2015   Procedure: Left Heart Cath and Coronary Angiography;  Surgeon: Troy Sine, MD;  Location: [place] [facility];  Service: Cardiovascular;  Laterality: N/A;  . CARDIAC CATHETERIZATION  01/15/2015   Procedure: Coronary Stent Intervention;  Surgeon: Troy Sine, MD;  Location: [place] [facility];  Service: Cardiovascular;;  . CORONARY BALLOON ANGIOPLASTY N/A 08/17/2017   Procedure: CORONARY BALLOON ANGIOPLASTY;  Surgeon: Leonie Man, MD;  Location:  [place] [facility];  Service: Cardiovascular;  Laterality: N/A;  . LEFT HEART CATH AND CORONARY ANGIOGRAPHY N/A 08/17/2017   Procedure: LEFT HEART CATH AND CORONARY ANGIOGRAPHY;  Surgeon: Leonie Man, MD;  Location: [place] [facility];  Service: Cardiovascular;  Laterality: N/A;  . LEFT HEART CATH AND CORONARY ANGIOGRAPHY N/A 11/09/2017   Procedure: LEFT HEART CATH AND CORONARY ANGIOGRAPHY;  Surgeon: Troy Sine, MD;  Location: [place] [facility];  Service: Cardiovascular;   Laterality: N/A;  . ORCHIECTOMY Bilateral     Current Medications: Current Meds  Medication Sig  . aspirin EC 81 MG tablet Take 1 tablet (81 mg total) by mouth daily. (Patient taking differently: Take 81 mg by mouth at bedtime. )  . atorvastatin (LIPITOR) 80 MG tablet TAKE 1 TABLET BY MOUTH DAILY AT 6 PM (Patient taking differently: Take 80 mg by mouth daily. TAKE 1 TABLET BY MOUTH DAILY AT 6 PM)  . carvedilol (COREG) 25 MG tablet TAKE 1 TABLET(25 MG) BY MOUTH TWICE DAILY WITH A MEAL (Patient taking differently: Take 25 mg by mouth 2 (two) times daily with a meal. )  . clopidogrel (PLAVIX) 75 MG tablet Take 1 tablet (75 mg total) by mouth daily.  [name] ENTRESTO 24-26 MG TAKE 1 TABLET BY MOUTH TWICE DAILY  . EPIPEN 2-PAK 0.3 MG/0.3ML SOAJ injection INJECT 0.3 MLS INTO THE MUSCLE ONCE **PATIENT NEEDS TO SCHEDULE AN OFFICE VISIT** (Patient taking differently: Inject 0.3 mg into the muscle as needed (for an allergic reaction). )  . nitroGLYCERIN (NITROSTAT) 0.4 MG SL tablet PLACE 1 TABLET BY MOUTH UNDER THE TONGUE EVERY 5 MINUTES FOR 3 DOSES AS NEEDED FOR CHEST PAIN (Patient taking differently: Place 0.4 mg under the tongue every 5 (five) minutes as needed for chest pain. )  . omeprazole (PRILOSEC) 20 MG capsule Take 20 mg by mouth at bedtime.   . Testosterone 1.62 % GEL Apply 4 Pump topically daily.      Allergies:   Iodine, Shellfish allergy, Penicillins, and Ace inhibitors   Social History   Socioeconomic History  . Marital status: Married    Spouse name: Not on file  . Number of children: Not on file  . Years of education: Not on file  . Highest education level: Not on file  Occupational History  . Not on file  Tobacco Use  . Smoking status: Former Research scientist (life sciences)  . Smokeless tobacco: Never Used  . Tobacco comment: quit around 2010  Vaping Use  . Vaping Use: Never used  Substance and Sexual Activity  . Alcohol use: Yes    Alcohol/week: 0.0 standard drinks    Comment: occasionally  . Drug  use: No    Comment: remote use of marijuana, quit a long time ago  . Sexual activity: Not on file  Other Topics Concern  . Not on file  Social History Narrative   Occupation:Senior Trader for [employer]   Divorced    No children      Former smoker    Alcohol Use - yes         Social Determinants of Health   Financial Resource Strain:   . Difficulty of Paying Living Expenses:   Food Insecurity:   . Worried About Charity fundraiser in the Last Year:   . Arboriculturist in the Last Year:   Transportation Needs:   . Film/video editor (Medical):   [name] Lack of Transportation (Non-Medical):   Physical Activity:   . Days  of Exercise per Week:   . Minutes of Exercise per Session:   Stress:   . Feeling of Stress :   Social Connections:   . Frequency of Communication with Friends and Family:   . Frequency of Social Gatherings with Friends and Family:   . Attends Religious Services:   . Active Member of Clubs or Organizations:   . Attends Archivist Meetings:   [name] Marital Status:      Family History: The patient's family history includes Heart attack in his father and maternal grandmother; Hyperlipidemia in an other family member; Hypertension in an other family member; Stroke in his paternal grandfather.  ROS:   Please see the history of present illness.     All other systems reviewed and are negative.  EKGs/Labs/Other Studies Reviewed:    The following studies were reviewed today:  Echo 04/30/2019 1. Definity contrast was given. There is no evidence of LV apical  thrombus.   . Left ventricular ejection fraction, by estimation, is 35 to 40%. The  left ventricle has moderately decreased function. The left ventricle  demonstrates regional wall motion abnormalities (see scoring  diagram/findings for description). Left  ventricular diastolic parameters are consistent with Grade I diastolic  dysfunction (impaired relaxation). There is severe akinesis of the left    ventricular, apical apical segment, anteroseptal wall and inferolateral  wall.  2. Right ventricular systolic function is normal. The right ventricular  size is normal. There is normal pulmonary artery systolic pressure.  3. The mitral valve is normal in structure. No evidence of mitral valve  regurgitation. No evidence of mitral stenosis.  4. The aortic valve is normal in structure. Aortic valve regurgitation is  not visualized. No aortic stenosis is present.   EKG:  EKG is not ordered today.    Recent Labs: 05/29/2019: ALT 14; TSH 0.577 09/10/2019: B Natriuretic Peptide 24.4; BUN 13; Creatinine, Ser 0.91; Hemoglobin 14.7; Platelets 193; Potassium 3.9; Sodium 138  Recent Lipid Panel    Component Value Date/Time   CHOL 88 (L) 05/29/2019 0955   TRIG 141 05/29/2019 0955   HDL 29 (L) 05/29/2019 0955   CHOLHDL 3.0 05/29/2019 0955   CHOLHDL 3.5 11/09/2017 0356   VLDL 27 11/09/2017 0356   LDLCALC 34 05/29/2019 0955   LDLDIRECT 99.9 09/15/2012 1738    Physical Exam:    VS:  BP 100/74   Pulse 78   Ht 6' (1.829 m)   Wt 210 lb (95.3 kg)   SpO2 98%   BMI 28.48 kg/m     Wt Readings from Last 3 Encounters:  09/24/19 210 lb (95.3 kg)  09/10/19 204 lb 5.9 oz (92.7 kg)  05/28/19 204 lb 6.4 oz (92.7 kg)     GEN:  Well nourished, well developed in no acute distress HEENT: Normal NECK: No JVD; No carotid bruits LYMPHATICS: No lymphadenopathy CARDIAC: RRR, no murmurs, rubs, gallops RESPIRATORY:  Clear to auscultation without rales, wheezing or rhonchi  ABDOMEN: Soft, non-tender, non-distended MUSCULOSKELETAL:  No edema; No deformity  SKIN: Warm and dry NEUROLOGIC:  Alert and oriented x 3 PSYCHIATRIC:  Normal affect   ASSESSMENT:    1. Coronary artery disease involving native coronary artery of native heart without angina pectoris   2. Medication management   3. Ischemic cardiomyopathy   4. LV (left ventricular) mural thrombus: resolved   5. Essential hypertension   6.  Hyperlipidemia LDL goal <70   7. History of testicular cancer   8. Palpitations  PLAN:    In order of problems listed above:  1. CAD: Denies any recent chest pain.  Continue aspirin and Plavix.  Will need P2Y12 study.  2. Palpitation: Symptom more consistent with PACs or PVCs.  Awaiting the results of the 30-day event monitor  3. Ischemic cardiomyopathy: Last EF 35-40 % on echocardiogram in March 2021  4. History of LV thrombus: Completed 55-month course of anticoagulation therapy, currently back on aspirin and Plavix  5. Hypertension: Blood pressure stable  6. Hyperlipidemia:Continue Lipitor  7. History of testicular cancer: Status post bilateral orchiectomy and radiation therapy.   Medication Adjustments/Labs and Tests Ordered: Current medicines are reviewed at length with the patient today.  Concerns regarding medicines are outlined above.  Orders Placed This Encounter  Procedures  . Platelet inhibition p2y12 (not at [facility])   No orders of the defined types were placed in this encounter.   Patient Instructions  Medication Instructions:  Your physician recommends that you continue on your current medications as directed. Please refer to the Current Medication list given to you today.  *If you need a refill on your cardiac medications before your next appointment, please call your pharmacy*  Lab Work: Your physician recommends that you return for lab work in 3-4 weeks:    Platelet Inhibition (p2y12) If you have labs (blood work) drawn today and your tests are completely normal, you will receive your results only by: [name] MyChart Message (if you have MyChart) OR . A paper copy in the mail If you have any lab test that is abnormal or we need to change your treatment, we will call you to review the results.  Testing/Procedures: NONE ordered at this time of appointment   Follow-Up: At [facility], you and your health needs are our priority.  As part of our continuing  mission to provide you with exceptional heart care, we have created designated Provider Care Teams.  These Care Teams include your primary Cardiologist (physician) and Advanced Practice Providers (APPs -  Physician Assistants and Nurse Practitioners) who all work together to provide you with the care you need, when you need it.  Your next appointment:   Follow up as scheduled  12/28/2019 at [place]  The format for your next appointment:   In Person  Provider:   [name], MD  Other Instructions      Signed, Almyra Deforest, [place]  09/26/2019 11:39 PM    [place]

## 2019-09-26 ENCOUNTER — Encounter: Payer: Self-pay | Admitting: Physician Assistant

## 2019-09-30 ENCOUNTER — Ambulatory Visit (INDEPENDENT_AMBULATORY_CARE_PROVIDER_SITE_OTHER): Payer: BC Managed Care – PPO

## 2019-09-30 DIAGNOSIS — R002 Palpitations: Secondary | ICD-10-CM

## 2019-10-12 ENCOUNTER — Telehealth: Payer: Self-pay | Admitting: Cardiovascular Disease

## 2019-10-12 ENCOUNTER — Other Ambulatory Visit: Payer: Self-pay

## 2019-10-12 MED ORDER — ENTRESTO 24-26 MG PO TABS: 1.0000 | ORAL_TABLET | Freq: Two times a day (BID) | ORAL | 6 refills | Status: DC

## 2019-10-12 NOTE — Telephone Encounter (Signed)
*  STAT* If patient is at the pharmacy, call can be transferred to refill team.   1. Which medications need to be refilled? (please list name of each medication and dose if known)  ENTRESTO 24-26 MG  2. Which pharmacy/location (including street and city if local pharmacy) is medication to be sent to? [id number] - [place], [place] - [address]  3. Do they need a 30 day or 90 day supply? 90 day supply

## 2019-11-03 ENCOUNTER — Other Ambulatory Visit: Payer: Self-pay | Admitting: Adult Health

## 2019-11-09 ENCOUNTER — Telehealth: Payer: Self-pay | Admitting: Cardiovascular Disease

## 2019-11-09 MED ORDER — ENTRESTO 24-26 MG PO TABS: 1.0000 | ORAL_TABLET | Freq: Two times a day (BID) | ORAL | 1 refills | Status: DC

## 2019-11-09 NOTE — Telephone Encounter (Signed)
Returned call to pt, he states that he needs a #90 day supply of Entresto sent to the [facility].

## 2019-11-09 NOTE — Telephone Encounter (Signed)
New message:     Patient calling stating that his medication is to high over 500,his insurance will not pay for his medication. Please call patient.

## 2019-12-28 ENCOUNTER — Ambulatory Visit: Payer: BC Managed Care – PPO | Admitting: Cardiovascular Disease

## 2019-12-28 ENCOUNTER — Encounter: Payer: Self-pay | Admitting: Cardiovascular Disease

## 2019-12-28 DIAGNOSIS — I10 Essential (primary) hypertension: Secondary | ICD-10-CM | POA: Diagnosis not present

## 2019-12-28 DIAGNOSIS — Z79899 Other long term (current) drug therapy: Secondary | ICD-10-CM

## 2019-12-28 DIAGNOSIS — I2510 Atherosclerotic heart disease of native coronary artery without angina pectoris: Secondary | ICD-10-CM

## 2019-12-28 DIAGNOSIS — R002 Palpitations: Secondary | ICD-10-CM

## 2019-12-28 DIAGNOSIS — I255 Ischemic cardiomyopathy: Secondary | ICD-10-CM

## 2019-12-28 DIAGNOSIS — Z9861 Coronary angioplasty status: Secondary | ICD-10-CM

## 2019-12-28 DIAGNOSIS — E785 Hyperlipidemia, unspecified: Secondary | ICD-10-CM | POA: Diagnosis not present

## 2019-12-28 MED ORDER — NITROGLYCERIN 0.4 MG SL SUBL: SUBLINGUAL_TABLET | SUBLINGUAL | 2 refills | Status: DC

## 2019-12-28 NOTE — Patient Instructions (Signed)
Medication Instructions:  The current medical regimen is effective;  continue present plan and medications as directed. Please refer to the Current Medication list given to you today.  *If you need a refill on your cardiac medications before your next appointment, please call your pharmacy*  Lab Work: FASTING LIPID, CMET,CBC AND TSH THE WEEK BEFORE YOU FOLLOW UP IN 6 MONTHS If you have labs (blood work) drawn today and your tests are completely normal, you will receive your results only by:  Cesar Woods (if you have MyChart) OR A paper copy in the mail.  If you have any lab test that is abnormal or we need to change your treatment, we will call you to review the results. You may go to any [facility] that is convenient for you however, we do have a lab in our office that is able to assist you. You DO NOT need an appointment for our lab. The lab is open 8:00am and closes at 4:00pm. Lunch 12:45 - 1:45pm.  Follow-Up: Your next appointment:  6 month(s) In Person with [name], MDPlease call our office 2 months in advance to schedule this appointment   At [facility], you and your health needs are our priority.  As part of our continuing mission to provide you with exceptional heart care, we have created designated Provider Care Teams.  These Care Teams include your primary Cardiologist (physician) and Advanced Practice Providers (APPs -  Physician Assistants and Nurse Practitioners) who all work together to provide you with the care you need, when you need it.

## 2019-12-28 NOTE — Progress Notes (Signed)
Patient ID: [name], male   DOB: 1965/12/26, 54 y.o.   MRN: [id number]    Primary MD: [name]  HPI: NASHAWN HILLOCK is a 54 y.o. male who presents to the office today for a 7 month follow-up cardiology evaluation.  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  In December, he developed substernal chest discomfort which he initially attributed to GERD.  This continued to recur over that day.  The following day he went to work and continued to experience vague discomfort.  He was seen by his primary physician, Dr. Scarlette Ar in his ECG was abnormal.  Changes of an anterolateral MI.  He presented to the emergency room.  He was not having any chest pain or shortness of breath or palpitations.  He underwent cardiac catheterization the following morning on 01/15/2015 which revealed significant CAD with 90% ostial LAD stenosis followed by 99% proximal LAD stenosis in the region of the first septal perforating artery with significant thrombus.  There was diffuse 50% stenosis followed by 95% ulcerated plaque in the proximal to mid LAD.  There also appeared to be an apical LAD dissection.  40% ramus intermediate, like high marginal stenosis and a large dominant RCA with 30% mid stenosis.  Large PDA and PLA vessels with septal collateralization to the proximal LAD.  He was felt to have an out of hospital late presentation anterolateral infarction secondary to his multiple high-grade stenoses.  He underwent a very difficult but successful intervention to his diffusely diseased LAD.  There was evidence for a spiral dissection in the mid distal LAD but ultimately was insertion of 3 tandem synergy DES stents (0.538, 3.038, and 3.020 mm from the mid LAD to the ostium and PTCA of the distal LAD with low-level inflation tach up his apical initial dissection.  There was total occlusion of the diagonal vessel  antegrade but there was significant development of retrograde collaterals to this diagonal vessel from the distal LAD.  Subsequent, he stabilized well.  He was seen in follow-up by Tenny Craw on 01/24/2015 and was doing well without chest pain or shortness of breath.  At that time his blood pressure was controlled.  He participated in the cardiac rehabilitation program.  He is back at work and has a stressful job in Engineer, mining for [employer].   When I saw him for follow-up evaluation in March 2017.  His ECG showed Q waves in V1 through V4 4 with persistent T-wave inversion anteriorly and anterolaterally.  I recommended that he discontinue metoprolol succinate and changed him to carvedilol.  He is now on carvedilol at 6.25 mg twice a day and is tolerating this well.  He continues to be on low-dose losartan 12.5 mg.  I scheduled him for follow-up echo Doppler study to reassess his LV function.  This was done on 05/30/2015.  Ejection fraction was now 40-45% and there was evidence for akinesis of the apical myocardium.  There was grade 1 diastolic dysfunction.  When I saw him in May 2017 for follow-up evaluation, he denied recurrent chest pain, PND or orthopnea.  At time, I slowly titrated ARB therapy and increase losartan to 25 mg at bedtime.  So increased his carvedilol to 9.375 mg twice a day for 2 weeks and further titrated this to 12.5 mg daily.    He developed a recurrent episode of chest pain in October 2017 and was hospitalized.  A nuclear perfusion study was performed which showed his previous large fixed defect involving the apex extending towards the mid ventricle in the anterior and lateral wall.  There is very minimal if any peri-infarct ischemia with an EF of 43%.  These were felt to be consistent with his prior MI.    Since I  saw him in March 2018 he had been evaluated in the emergency room with intermittent chest and back pain.  He had undergone a stress Myoview study in January 2019  which showed an EF of 40% with a large defect consistent with his prior LAD infarction and there was no significant ischemia except mild peri-infarction ischemia.  In July 2019 he developed recurrent symptomatology and when I was on vacation and underwent repeat catheterization by Dr. Ellyn Hack.  He was found to have 85% narrowing in the midportion of his long LAD stent in the region of the diagonal takeoff.  The diagonal vessel was occluded but had excellent collaterals.  He was rehospitalized in October 2019 with recurrent chest pain.  At that time on November 09, 2017 I performed repeat cardiac catheterization.  This revealed a widely patent LAD stent with mild residual narrowing of 20% at the site of recent in-stent restenosis that was treated with Cutting Balloon in July 2019.  The diagonal branch which arose from this segment has both antegrade filling and extensive retrograde collateralization being filled from the apical LAD.  The left circumflex marginal vessel had 30 and 35% proximal to mid stenoses.  The RCA was a large dominant vessel with smooth 20% narrowing.  He had mild to moderate LV dysfunction and his ejection fraction was improved to 40 to 45% with apical kinesis and distal anterolateral hypocontractility.  His apical contour raised concern for possible apical thrombus.  An echo Doppler study was suspicious for a layer of mural thrombus apically which was different from July 2019.  As result he was started on apixaban.  I saw him for follow-up on November 28, 2017 at which time he continued to remain stable and has been without chest pain.  He is on Eliquis 5 mg, Plavix 75 mg, carvedilol 25 mg twice a day, atorvastatin 80 mg and has been on losartan 25 mg daily.  He denies chest tightness, baseline shortness of breath but had noted some mild shortness of breath with exertion.  During that evaluation, I discontinued losartan and initiated Entresto.  He took 24/25 mg twice daily for 2 weeks and  was then further titrated up to 49/51 mg twice a day.  He has continued to be on atorvastatin 80 mg with target LDL less than 70.  I  saw him in December 2019 at which time he was feeling improved with initiation of Entresto since initiating.  Over the past year, he has been titrated up to maximal dosing at 97/103 mg twice a day.  He has continued to take carvedilol 25 mg twice a day.  He has been on Eliquis 5 mg twice a day with Plavix and has been on Prilosec.  He was evaluated in August 2020 by Dr. Davina Poke prior to undergoing colonoscopy given instructions to hold his Plavix and Eliquis.  The past he was documented to have an apical mural thrombus and on his most recent echo in March 2020 EF was 40 to 45%.  There was septal and apical akinesis secondary to his prior MI.  There was no apical thrombus noted which had resolved from his previous go.  During  that evaluation blood pressure was low.  When I saw him on January 03, 2019  he had noticed some episodes of mild dizziness.  He denied any palpitations or awareness of arrhythmia.  He had experienced some depression secondary to the COVID-19 pandemic.    During that evaluation his blood pressure was low and on exam there was a 10 mm orthostatic drop from supine to standing.  Consequently I reduced his Entresto back to 49/51 mg twice a day.  Since it was well over a year from his ACS and stenting I recommended discontinuance of Plavix and resuming aspirin 81 mg to take along with Eliquis 5 mg twice a day.  He underwent a follow-up echo Doppler study on April 30, 2019 which showed an EF of 35 to 40%.  There was grade 1 diastolic dysfunction.  There was severe akinesis of the apex and distal anteroseptal new from lateral wall.  Definity contrast was used and there was no apical thrombus.  I last saw him in April 2021. At that time, he felt well and had both Covid vaccinations.  He was be planning to undergo a colonoscopy in May.  He denied chest pain  PND orthopnea.  He denied any orthostatic symptoms.   During that evaluation, he apparently was now on a further reduced dose of Entresto 24/26. His blood pressure was stable. I reviewed his echo Doppler study which showed an EF of 35 to 40% without thrombus. As result I discontinued Eliquis and recommend mended resumption of Plavix plus aspirin. I did recommend he undergo a [id number] test to make certain his Plavix responsive.  Since I last saw him, he tells me he had developed some episodes of palpitations in early August. He went to the emergency room. Laboratory was drawn. Troponin was negative. BNP was excellent at 24.4. There was a 13-hour wait and ultimately he ended up being told to leave since his labs were normal. He did subsequently wear a monitor from August 22 through October 29, 2019 which showed predominant sinus rhythm at 78 bpm. Slowest heart rate was 60 bpm which was sinus rhythm and his fastest heart rate was sinus tachycardia at 126 bpm which occurred at 12:55 AM. Subsequently, he has felt well. He denies chest pain PND orthopnea. He realized today that he had never had a P2 Y 12 test done and he plans to do this. He presents for evaluation.  Past Medical History:  Diagnosis Date  . ACS (acute coronary syndrome) ([place]) 11/2015  . Atypical chest pain    Negative Myoview 2010  . Coronary artery disease    a.s/p STEMI in 01/2015 requiring placement of 4 DES to LAD. b. 11/2015: NST showing apical scar with minimal peri-infarct ischemia.  . Essential hypertension   . Food allergy    Anaphylaxis with shell fish  . GERD (gastroesophageal reflux disease)   . History of migraines   . Hyperlipidemia   . Ischemic cardiomyopathy    a. Echo 05/2015: EF 40-45%, apical akineiss with Grade 1 DD.  [name] Myocardial infarction ([place]) 2016  . Testicle cancer ([place])    In remission - followed by Dr. Risa Grill; had bilateral orchiectomy and XRT in past    Past Surgical History:  Procedure Laterality Date   . CARDIAC CATHETERIZATION N/A 01/15/2015   Procedure: Left Heart Cath and Coronary Angiography;  Surgeon: Troy Sine, MD;  Location: [facility];  Service: Cardiovascular;  Laterality: N/A;  . CARDIAC CATHETERIZATION  01/15/2015  Procedure: Coronary Stent Intervention;  Surgeon: Troy Sine, MD;  Location: [facility];  Service: Cardiovascular;;  . CORONARY BALLOON ANGIOPLASTY N/A 08/17/2017   Procedure: CORONARY BALLOON ANGIOPLASTY;  Surgeon: Leonie Man, MD;  Location: [facility];  Service: Cardiovascular;  Laterality: N/A;  . LEFT HEART CATH AND CORONARY ANGIOGRAPHY N/A 08/17/2017   Procedure: LEFT HEART CATH AND CORONARY ANGIOGRAPHY;  Surgeon: Leonie Man, MD;  Location: [facility];  Service: Cardiovascular;  Laterality: N/A;  . LEFT HEART CATH AND CORONARY ANGIOGRAPHY N/A 11/09/2017   Procedure: LEFT HEART CATH AND CORONARY ANGIOGRAPHY;  Surgeon: Troy Sine, MD;  Location: [facility];  Service: Cardiovascular;  Laterality: N/A;  . ORCHIECTOMY Bilateral     Allergies  Allergen Reactions  . Iodine Anaphylaxis    Shellfish allergy   . Shellfish Allergy Anaphylaxis  . Penicillins Other (See Comments)    Childhood allergy Has patient had a PCN reaction causing immediate rash, facial/tongue/throat swelling, SOB or lightheadedness with hypotension: Unknown Has patient had a PCN reaction causing severe rash involving mucus membranes or skin necrosis: Unknown Has patient had a PCN reaction that required hospitalization: No Has patient had a PCN reaction occurring within the last 10 years: No If all of the above answers are "NO", then may proceed with Cephalosporin use.    . Ace Inhibitors Cough    Current Outpatient Medications  Medication Sig Dispense Refill  . aspirin EC 81 MG tablet Take 1 tablet (81 mg total) by mouth daily. (Patient taking differently: Take 81 mg by mouth at bedtime. ) 90 tablet 3  . atorvastatin (LIPITOR) 80  MG tablet TAKE 1 TABLET BY MOUTH DAILY AT 6 PM 90 tablet 1  . carvedilol (COREG) 25 MG tablet TAKE 1 TABLET(25 MG) BY MOUTH TWICE DAILY WITH A MEAL (Patient taking differently: Take 25 mg by mouth 2 (two) times daily with a meal. ) 60 tablet 8  . clopidogrel (PLAVIX) 75 MG tablet Take 1 tablet (75 mg total) by mouth daily. 90 tablet 3  . EPIPEN 2-PAK 0.3 MG/0.3ML SOAJ injection INJECT 0.3 MLS INTO THE MUSCLE ONCE **PATIENT NEEDS TO SCHEDULE AN OFFICE VISIT** (Patient taking differently: Inject 0.3 mg into the muscle as needed (for an allergic reaction). ) 2 Device 0  . nitroGLYCERIN (NITROSTAT) 0.4 MG SL tablet PLACE 1 TABLET BY MOUTH UNDER THE TONGUE EVERY 5 MINUTES FOR 3 DOSES AS NEEDED FOR CHEST PAIN 25 tablet 2  . omeprazole (PRILOSEC) 20 MG capsule Take 20 mg by mouth at bedtime.     . sacubitril-valsartan (ENTRESTO) 24-26 MG Take 1 tablet by mouth 2 (two) times daily. 180 tablet 1  . Testosterone 1.62 % GEL Apply 4 Pump topically daily.      No current facility-administered medications for this visit.    Social History   Socioeconomic History  . Marital status: Married    Spouse name: Not on file  . Number of children: Not on file  . Years of education: Not on file  . Highest education level: Not on file  Occupational History  . Not on file  Tobacco Use  . Smoking status: Former Research scientist (life sciences)  . Smokeless tobacco: Never Used  . Tobacco comment: quit around 2010  Vaping Use  . Vaping Use: Never used  Substance and Sexual Activity  . Alcohol use: Yes    Alcohol/week: 0.0 standard drinks    Comment: occasionally  . Drug use: No  Comment: remote use of marijuana, quit a long time ago  . Sexual activity: Not on file  Other Topics Concern  . Not on file  Social History Narrative   Occupation:Senior Trader for [employer]   Divorced    No children      Former smoker    Alcohol Use - yes         Social Determinants of Health   Financial Resource Strain:   . Difficulty of Paying  Living Expenses: Not on file  Food Insecurity:   . Worried About Charity fundraiser in the Last Year: Not on file  . Ran Out of Food in the Last Year: Not on file  Transportation Needs:   . Lack of Transportation (Medical): Not on file  . Lack of Transportation (Non-Medical): Not on file  Physical Activity:   . Days of Exercise per Week: Not on file  . Minutes of Exercise per Session: Not on file  Stress:   . Feeling of Stress : Not on file  Social Connections:   . Frequency of Communication with Friends and Family: Not on file  . Frequency of Social Gatherings with Friends and Family: Not on file  . Attends Religious Services: Not on file  . Active Member of Clubs or Organizations: Not on file  . Attends Archivist Meetings: Not on file  . Marital Status: Not on file  Intimate Partner Violence:   . Fear of Current or Ex-Partner: Not on file  . Emotionally Abused: Not on file  . Physically Abused: Not on file  . Sexually Abused: Not on file   Additional social history is notable that he was born in [place], [place].  He lived for over a decade in [place].  He is married.  Former smoker.  Family History  Problem Relation Age of Onset  . Heart attack Father        CABG at agae 71  . Stroke Paternal Grandfather   . Hypertension Other   . Hyperlipidemia Other   . Heart attack Maternal Grandmother     ROS General: Negative; No fevers, chills, or night sweats HEENT: Negative; No changes in vision or hearing, sinus congestion, difficulty swallowing Pulmonary: Negative; No cough, wheezing, shortness of breath, hemoptysis Cardiovascular: See HPI: No recurrent chest pain, presyncope, syncope, palpatations GI: Negative; No nausea, vomiting, diarrhea, or abdominal pain GU: History of testicular cancer, status post orchiectomy. Musculoskeletal: Negative; no myalgias, joint pain, or weakness Hematologic: Negative; no easy bruising, bleeding Endocrine: Negative; no  heat/cold intolerance; no diabetes, Neuro: Negative; no changes in balance, headaches Skin: Negative; No rashes or skin lesions Psychiatric: Negative; No behavioral problems, depression Sleep: Negative; No snoring,  daytime sleepiness, hypersomnolence, bruxism, restless legs, hypnogognic hallucinations. Other comprehensive 14 point system review is negative   Physical Exam BP 118/80   Pulse 78   Ht 6' (1.829 m)   Wt 217 lb (98.4 kg)   SpO2 98%   BMI 29.43 kg/m    Repeat blood pressure by me was 122/74 supine 122/70 standing.           Wt Readings from Last 3 Encounters:  12/28/19 217 lb (98.4 kg)  09/24/19 210 lb (95.3 kg)  09/10/19 204 lb 5.9 oz (92.7 kg)   General: Alert, oriented, no distress.  Skin: normal turgor, no rashes, warm and dry HEENT: Normocephalic, atraumatic. Pupils equal round and reactive to light; sclera anicteric; extraocular muscles intact;  Nose without nasal septal hypertrophy Mouth/Parynx benign;  Mallinpatti scale 3 Neck: No JVD, no carotid bruits; normal carotid upstroke Lungs: clear to ausculatation and percussion; no wheezing or rales Chest wall: without tenderness to palpitation Heart: PMI not displaced, RRR, s1 s2 normal, 1/6 systolic murmur, no diastolic murmur, no rubs, gallops, thrills, or heaves Abdomen: soft, nontender; no hepatosplenomehaly, BS+; abdominal aorta nontender and not dilated by palpation. Back: no CVA tenderness Pulses 2+ Musculoskeletal: full range of motion, normal strength, no joint deformities Extremities: no clubbing cyanosis or edema, Homan's sign negative  Neurologic: grossly nonfocal; Cranial nerves grossly wnl Psychologic: Normal mood and affect  ECG (independently read by me): Normal sinus rhythm at 78 bpm.  QS complex V1 through V4 unchanged.  No ectopy.  April 2021 ECG (independently read by me): NSR at 72; QS V1-4 c/w prior anterior MI: no ectopy; normal intervals  November 2020 ECG (independently read by me):  Normal sinus rhythm at 78 bpm.  QS complex V1 through V4 consistent with prior anterior MI.  No ectopy.  QTc interval 389 ms.  Parable '1 7 8 ' ms  December 2019 ECG (independently read by me): Normal sinus rhythm at 75 bpm.  QS complex V1 through V4 consistent with old anterior MI.  Normal intervals.  No ectopy.  November 28, 2017 ECG (independently read by me): Normal sinus rhythm at 83 bpm.  QS complex V1 through V5 consistent with old anterior MI.  Normal intervals.  No ectopy.  March 2019 ECG (independently read by me): Normal sinus rhythm at 79 bpm.  Old anterolateral infarct changes.  QRS complex V1 through V5; normal intervals.  No significant ST changes.  January 2018 ECG (independently read by me): Normal sinus rhythm at 92 bpm.  Anterior Q waves consistent with his prior anterolateral MI.  September 2017 ECG (independently read by me): Sinus rhythm at 83 bpm.  QRS complex V1-V6  concordant with his large anterolateral MI.  May 2017 ECG (independently read by me): Normal sinus rhythm at 79 bpm.  T-wave inversion V2 through V6 and 1 and aVL concordant with his prior anterior to anterolateral MI.  March 2017 ECG (independently read by me): NSR at 80; QS V1-4 with T wave inversion V1-V6, I and aVL  LABS:  BMP Latest Ref Rng & Units 09/10/2019 08/27/2019 05/29/2019  Glucose 70 - 99 mg/dL 122(H) 139(H) 98  BUN 6 - 20 mg/dL '13 14 17  ' Creatinine 0.61 - 1.24 mg/dL 0.91 0.87 0.80  BUN/Creat Ratio 9 - 20 - - 21(H)  Sodium 135 - 145 mmol/L 138 142 142  Potassium 3.5 - 5.1 mmol/L 3.9 3.8 4.4  Chloride 98 - 111 mmol/L 103 107 103  CO2 22 - 32 mmol/L '26 27 25  ' Calcium 8.9 - 10.3 mg/dL 8.8(L) 8.9 8.8     Hepatic Function Latest Ref Rng & Units 05/29/2019 02/07/2018 02/15/2017  Total Protein 6.0 - 8.5 g/dL 6.5 6.3 7.0  Albumin 3.8 - 4.9 g/dL 4.3 4.3 4.7  AST 0 - 40 IU/L '13 15 17  ' ALT 0 - 44 IU/L '14 21 19  ' Alk Phosphatase 39 - 117 IU/L 102 97 92  Total Bilirubin 0.0 - 1.2 mg/dL 1.7(H) 1.3(H)  2.3(H)  Bilirubin, Direct 0.00 - 0.40 mg/dL - - 0.15    CBC Latest Ref Rng & Units 09/10/2019 08/27/2019 05/29/2019  WBC 4.0 - 10.5 K/uL 8.4 8.5 7.5  Hemoglobin 13.0 - 17.0 g/dL 14.7 14.2 14.3  Hematocrit 39 - 52 % 44.0 43.5 42.3  Platelets 150 - 400 K/uL 193  194 168   Lab Results  Component Value Date   MCV 89.1 09/10/2019   MCV 89.9 08/27/2019   MCV 88 05/29/2019    Lab Results  Component Value Date   TSH 0.577 05/29/2019    BNP    Component Value Date/Time   BNP 24.4 09/10/2019 1147   BNP <2.0 06/26/2010 1334    ProBNP    Component Value Date/Time   PROBNP 46 02/07/2018 1216   PROBNP <2.0 pg/mL 10/17/2009 1834     Lipid Panel     Component Value Date/Time   CHOL 88 (L) 05/29/2019 0955   TRIG 141 05/29/2019 0955   HDL 29 (L) 05/29/2019 0955   CHOLHDL 3.0 05/29/2019 0955   CHOLHDL 3.5 11/09/2017 0356   VLDL 27 11/09/2017 0356   LDLCALC 34 05/29/2019 0955   LDLDIRECT 99.9 09/15/2012 1738     RADIOLOGY: No results found.  IMPRESSION:  1. Ischemic cardiomyopathy   2. Coronary artery disease involving native coronary artery of native heart without angina pectoris   3. CAD S/P percutaneous coronary angioplasty   4. Essential hypertension   5. Hyperlipidemia LDL goal <70   6. Palpitations   7. Medication management     ASSESSMENT AND PLAN: Mr. Fabrice Dyal is a 54 year old white male who presented to [facility] on the evening of 01/14/2015 with late presentation following an anterolateral MI which most likely occurred the day prior to his presentation.  In the emergency room, he was pain-free. Cardiac catheterization revealed severe diffuse CAD with ulcerated plaque and thrombus and evidence for spontaneous distal LAD dissection.  He had a very difficult but successful PCI to his LAD, and ultimate insertion of 3 Synergy DES stents covering a total of 96 mm and extending from the ostium to the mid LAD.  He also had PTCA of his distal LAD dissection.  A  echo Doppler showed an EF of 40-45% and apical akinesis.  He was without  anginal symptomatology until July 2019 when he was found to have very focal 85% in-stent restenosis in the LAD stent at the region of the diagonal vessel with occlusion of the diagonal.  He had excellent collateralization to this diagonal vessel.  He underwent successful cutting balloon.  His last catheterization was on November 09, 2017 which demonstrated patent LAD stents  and there was now both antegrade and extensive retrograde collateralization to the diagonal vessel.  Subsequently he was without recurrent angina.  He was transitioned from losartan to [facility].  He was treated with Eliquis due to apical thrombus which on his most recent echo Doppler study in March 2021 had resolved.  EF was 35 to 40% with grade 1 diastolic dysfunction and there was severe akinesis of the apex and distal anteroseptal lateral wall.  He had developed increasing palpitations leading to a visit to the ER.  Laboratory was negative and he was never fully evaluated.  A subsequent Holter monitor did not reveal any significant ectopy with predominant sinus rhythm with an average rate at 78, slowest heart rate at sinus bradycardia at 60 bpm and fastest heart rate being sinus tachycardia at 126 bpm.  His Entresto dose had been reduced due to orthostatic blood pressure changes in the past.  Blood pressure today is stable without orthostatic change on Entresto 24/26 twice daily.  He continues to be on carvedilol 25 mg twice a day with resting pulse in the 70s.  He is on atorvastatin 80 mg for hyperlipidemia with target LDL less than  70.  He had been on an Eliquis which ultimately was discontinued and he now is on aspirin/Plavix.  He plans to go for the P2 Y 12 test to make certain he is his Plavix responsive.  His primary physician Dr. Scarlette Ar in [facility] recently retired. I will see him in 6 months for follow-up evaluation or sooner as needed.   Troy Sine, MD, [facility]  12/28/2019 6:17 PM

## 2020-01-09 ENCOUNTER — Other Ambulatory Visit: Payer: Self-pay | Admitting: Cardiovascular Disease

## 2020-02-29 DIAGNOSIS — M7522 Bicipital tendinitis, left shoulder: Secondary | ICD-10-CM | POA: Diagnosis not present

## 2020-04-01 DIAGNOSIS — R000 Tachycardia, unspecified: Secondary | ICD-10-CM | POA: Diagnosis not present

## 2020-04-01 DIAGNOSIS — R0789 Other chest pain: Secondary | ICD-10-CM | POA: Diagnosis not present

## 2020-04-01 DIAGNOSIS — Z955 Presence of coronary angioplasty implant and graft: Secondary | ICD-10-CM | POA: Diagnosis not present

## 2020-04-01 DIAGNOSIS — R918 Other nonspecific abnormal finding of lung field: Secondary | ICD-10-CM | POA: Diagnosis not present

## 2020-04-01 DIAGNOSIS — Z7982 Long term (current) use of aspirin: Secondary | ICD-10-CM | POA: Diagnosis not present

## 2020-04-01 DIAGNOSIS — J984 Other disorders of lung: Secondary | ICD-10-CM | POA: Diagnosis not present

## 2020-04-01 DIAGNOSIS — Z87891 Personal history of nicotine dependence: Secondary | ICD-10-CM | POA: Diagnosis not present

## 2020-04-01 DIAGNOSIS — R079 Chest pain, unspecified: Secondary | ICD-10-CM | POA: Diagnosis not present

## 2020-04-01 DIAGNOSIS — R110 Nausea: Secondary | ICD-10-CM | POA: Diagnosis not present

## 2020-04-01 DIAGNOSIS — J9811 Atelectasis: Secondary | ICD-10-CM | POA: Diagnosis not present

## 2020-04-01 DIAGNOSIS — R0602 Shortness of breath: Secondary | ICD-10-CM | POA: Diagnosis not present

## 2020-04-01 DIAGNOSIS — R002 Palpitations: Secondary | ICD-10-CM | POA: Diagnosis not present

## 2020-04-01 DIAGNOSIS — Z79899 Other long term (current) drug therapy: Secondary | ICD-10-CM | POA: Diagnosis not present

## 2020-04-01 DIAGNOSIS — Z7901 Long term (current) use of anticoagulants: Secondary | ICD-10-CM | POA: Diagnosis not present

## 2020-04-01 DIAGNOSIS — Z7902 Long term (current) use of antithrombotics/antiplatelets: Secondary | ICD-10-CM | POA: Diagnosis not present

## 2020-05-08 DIAGNOSIS — Z79899 Other long term (current) drug therapy: Secondary | ICD-10-CM | POA: Diagnosis not present

## 2020-05-08 DIAGNOSIS — R0789 Other chest pain: Secondary | ICD-10-CM | POA: Diagnosis not present

## 2020-05-08 DIAGNOSIS — R002 Palpitations: Secondary | ICD-10-CM | POA: Diagnosis not present

## 2020-05-08 DIAGNOSIS — Z888 Allergy status to other drugs, medicaments and biological substances status: Secondary | ICD-10-CM | POA: Diagnosis not present

## 2020-05-08 DIAGNOSIS — R0602 Shortness of breath: Secondary | ICD-10-CM | POA: Diagnosis not present

## 2020-05-08 DIAGNOSIS — R9431 Abnormal electrocardiogram [ECG] [EKG]: Secondary | ICD-10-CM | POA: Diagnosis not present

## 2020-05-08 DIAGNOSIS — Z7902 Long term (current) use of antithrombotics/antiplatelets: Secondary | ICD-10-CM | POA: Diagnosis not present

## 2020-05-08 DIAGNOSIS — Z87891 Personal history of nicotine dependence: Secondary | ICD-10-CM | POA: Diagnosis not present

## 2020-05-08 DIAGNOSIS — Z880 Allergy status to penicillin: Secondary | ICD-10-CM | POA: Diagnosis not present

## 2020-05-08 DIAGNOSIS — R079 Chest pain, unspecified: Secondary | ICD-10-CM | POA: Diagnosis not present

## 2020-05-08 DIAGNOSIS — Z7982 Long term (current) use of aspirin: Secondary | ICD-10-CM | POA: Diagnosis not present

## 2020-05-08 DIAGNOSIS — Z91013 Allergy to seafood: Secondary | ICD-10-CM | POA: Diagnosis not present

## 2020-05-28 NOTE — Progress Notes (Signed)
Cardiology Office Note   Date:  05/30/2020   ID:  Cesar Woods, DOB 12-24-1965, MRN [id number]  PCP:  Delilah Shan, MD  Cardiologist:  Remonia Richter  CC: ED follow up-Palpitations    History of Present Illness: Cesar Woods is a 55 y.o. male who presents for ongoing assessment and management of CAD, ICM, h/o LV thrombus, HTN, HL, with other history to include GERD and testicular cancer s/p bilateral orchiectomy and radiation.   Patient had a STEMI in 2016 with DES to LAD with 3 overlapping drug-eluting stents.  Unfortunately he had in-stent restenosis require PCI in July 2019.  He also has a history of ischemic cardiomyopathy with apical mural thrombus on Eliquis.  Echocardiogram obtained in March 2020 showed EF 40 to 45% with septal/apical akinesis, mild LAE and aortic calcification.  Myoview in December 2020 showed no ischemia, large apical infarct similar to the previous study.    Due to episodes of dizziness and presyncope, previous Entresto was decreased.  Repeat echocardiogram obtained on 04/30/2019 showed EF 35 to 40%, severe akinesis of the left ventricular, apical segment, anteroseptal wall and inferolateral wall, no significant valve issue, resolution of LV thrombus, due to lack of LV thrombus, Eliquis was discontinued.  He was restarted on Plavix plus aspirin.  Last seen by [facility] on 12/28/2019, he had developed some episodes of palpitations in early August. He went to the emergency room. Laboratory was drawn. Troponin was negative. BNP was excellent at 24.4. There was a 13-hour wait and ultimately he ended up being told to leave since his labs were normal. He did subsequently wear a monitor from August 22 through October 29, 2019 which showed predominant sinus rhythm at 78 bpm. Slowest heart rate was 60 bpm which was sinus rhythm and his fastest heart rate was sinus tachycardia at 126 bpm which occurred at 12:55 AM. Subsequently, he has felt well. He had planned to have [id number] test  to make certain he is Plavix responsive.   Unfortunately, he was seen in ED on 05/08/2020 for complaints of palpitations in [place]. Cardiac troponin was negative. He stated that he was at the movies when he began to feel a chill and left jaw pain. HR was checked and found to be 110. BP 121/80. He was ruled out for ACS and was to follow up with cardiology.   He states that he also had a second episode when he was at a concert in [place] causing him to leave the concert and going by the ED but by the time he went there his heart rate had normalized.  He continues to have anxiety about these frequent occurrences.  Based upon his history this is understandable.  Since ER visit in [place] he has not had any further episodes.  Past Medical History:  Diagnosis Date  . ACS (acute coronary syndrome) ([place]) 11/2015  . Atypical chest pain    Negative Myoview 2010  . Coronary artery disease    a.s/p STEMI in 01/2015 requiring placement of 4 DES to LAD. b. 11/2015: NST showing apical scar with minimal peri-infarct ischemia.  . Essential hypertension   . Food allergy    Anaphylaxis with shell fish  . GERD (gastroesophageal reflux disease)   . History of migraines   . Hyperlipidemia   . Ischemic cardiomyopathy    a. Echo 05/2015: EF 40-45%, apical akineiss with Grade 1 DD.  [name] Myocardial infarction ([place]) 2016  . Testicle cancer ([place])    In remission - followed by  Dr. Risa Grill; had bilateral orchiectomy and XRT in past    Past Surgical History:  Procedure Laterality Date  . CARDIAC CATHETERIZATION N/A 01/15/2015   Procedure: Left Heart Cath and Coronary Angiography;  Surgeon: Troy Sine, MD;  Location: [place] [facility];  Service: Cardiovascular;  Laterality: N/A;  . CARDIAC CATHETERIZATION  01/15/2015   Procedure: Coronary Stent Intervention;  Surgeon: Troy Sine, MD;  Location: [place] [facility];  Service: Cardiovascular;;  . CORONARY BALLOON ANGIOPLASTY N/A 08/17/2017   Procedure:  CORONARY BALLOON ANGIOPLASTY;  Surgeon: Leonie Man, MD;  Location: [place] [facility];  Service: Cardiovascular;  Laterality: N/A;  . LEFT HEART CATH AND CORONARY ANGIOGRAPHY N/A 08/17/2017   Procedure: LEFT HEART CATH AND CORONARY ANGIOGRAPHY;  Surgeon: Leonie Man, MD;  Location: [place] [facility];  Service: Cardiovascular;  Laterality: N/A;  . LEFT HEART CATH AND CORONARY ANGIOGRAPHY N/A 11/09/2017   Procedure: LEFT HEART CATH AND CORONARY ANGIOGRAPHY;  Surgeon: Troy Sine, MD;  Location: [place] [facility];  Service: Cardiovascular;  Laterality: N/A;  . ORCHIECTOMY Bilateral      Current Outpatient Medications  Medication Sig Dispense Refill  . aspirin EC 81 MG tablet Take 1 tablet (81 mg total) by mouth daily. (Patient taking differently: Take 81 mg by mouth at bedtime.) 90 tablet 3  . atorvastatin (LIPITOR) 80 MG tablet TAKE 1 TABLET BY MOUTH DAILY AT 6 PM 90 tablet 1  . carvedilol (COREG) 25 MG tablet Take 1 tablet (25 mg total) by mouth 2 (two) times daily with a meal. 60 tablet 8  . clopidogrel (PLAVIX) 75 MG tablet Take 1 tablet (75 mg total) by mouth daily. 90 tablet 3  . EPIPEN 2-PAK 0.3 MG/0.3ML SOAJ injection INJECT 0.3 MLS INTO THE MUSCLE ONCE **PATIENT NEEDS TO SCHEDULE AN OFFICE VISIT** (Patient taking differently: Inject 0.3 mg into the muscle as needed (for an allergic reaction).) 2 Device 0  . nitroGLYCERIN (NITROSTAT) 0.4 MG SL tablet PLACE 1 TABLET BY MOUTH UNDER THE TONGUE EVERY 5 MINUTES FOR 3 DOSES AS NEEDED FOR CHEST PAIN 25 tablet 2  . omeprazole (PRILOSEC) 20 MG capsule Take 20 mg by mouth at bedtime.     . sacubitril-valsartan (ENTRESTO) 24-26 MG Take 1 tablet by mouth 2 (two) times daily. 180 tablet 1  . Testosterone 1.62 % GEL Apply 4 Pump topically daily.      No current facility-administered medications for this visit.    Allergies:   Iodine, Shellfish allergy, Penicillins, and Ace inhibitors    Social History:  The patient  reports that he  has quit smoking. He has never used smokeless tobacco. He reports current alcohol use. He reports that he does not use drugs.   Family History:  The patient's family history includes Heart attack in his father and maternal grandmother; Hyperlipidemia in an other family member; Hypertension in an other family member; Stroke in his paternal grandfather.    ROS: All other systems are reviewed and negative. Unless otherwise mentioned in H&P    PHYSICAL EXAM: VS:  BP 119/62   Pulse 74   Ht 6' (1.829 m)   Wt 213 lb 3.2 oz (96.7 kg)   SpO2 97%   BMI 28.92 kg/m  , BMI Body mass index is 28.92 kg/m. GEN: Well nourished, well developed, in no acute distress HEENT: normal Neck: no JVD, carotid bruits, or masses Cardiac: RRR; no murmurs, rubs, or gallops,no edema  Respiratory:  Clear to auscultation bilaterally,  normal work of breathing GI: soft, nontender, nondistended, + BS MS: no deformity or atrophy Skin: warm and dry, no rash Neuro:  Strength and sensation are intact Psych: euthymic mood, full affect   EKG: Normal sinus rhythm with occasional fusion complexes.  No evidence of WPW on review.  Ventricular rate of 74 bpm.  (Personally reviewed)  Recent Labs: 09/10/2019: B Natriuretic Peptide 24.4; BUN 13; Creatinine, Ser 0.91; Hemoglobin 14.7; Platelets 193; Potassium 3.9; Sodium 138    Lipid Panel    Component Value Date/Time   CHOL 88 (L) 05/29/2019 0955   TRIG 141 05/29/2019 0955   HDL 29 (L) 05/29/2019 0955   CHOLHDL 3.0 05/29/2019 0955   CHOLHDL 3.5 11/09/2017 0356   VLDL 27 11/09/2017 0356   LDLCALC 34 05/29/2019 0955   LDLDIRECT 99.9 09/15/2012 1738      Wt Readings from Last 3 Encounters:  05/30/20 213 lb 3.2 oz (96.7 kg)  12/28/19 217 lb (98.4 kg)  09/24/19 210 lb (95.3 kg)      Other studies Reviewed: Echocardiogram May 15, 2019 1. Definity contrast was given. There is no evidence of LV apical  thrombus.   . Left ventricular ejection fraction, by estimation,  is 35 to 40%. The  left ventricle has moderately decreased function. The left ventricle  demonstrates regional wall motion abnormalities (see scoring  diagram/findings for description). Left  ventricular diastolic parameters are consistent with Grade I diastolic  dysfunction (impaired relaxation). There is severe akinesis of the left  ventricular, apical apical segment, anteroseptal wall and inferolateral  wall.  2. Right ventricular systolic function is normal. The right ventricular  size is normal. There is normal pulmonary artery systolic pressure.  3. The mitral valve is normal in structure. No evidence of mitral valve  regurgitation. No evidence of mitral stenosis.  4. The aortic valve is normal in structure. Aortic valve regurgitation is  not visualized. No aortic stenosis is present.   Left Heart Cath 11/09/2017   Non-stenotic Prox LAD lesion was previously treated.  Non-stenotic Mid LAD lesion was previously treated.  Ost 2nd Diag lesion is 40% stenosed.  Ramus lesion is 40% stenosed.  Ost 1st Mrg-1 lesion is 30% stenosed.  Ost 1st Mrg-2 lesion is 35% stenosed.  Mid RCA lesion is 20% stenosed.  Prox LAD to Mid LAD lesion is 20% stenosed.  LV end diastolic pressure is mildly elevated.  There is mild to moderate left ventricular systolic dysfunction.   The LAD stent is widely patent with the exception of mild residual narrowing of 20% at the site of recent in-stent restenosis was treated with [place] Ball[place] in July 2019.  The diagonal branch which arises from this segment has both antegrade filling and extensive retrograde collateralization being filled from the apical LAD.  The left circumflex marginal vessel has 30 and 35% proximal to mid stenoses.  RCA is a large dominant vessel with 20% smooth mid narrowing.  Mild to moderate LV dysfunction with an ejection fraction of 40 to 45% with apical akinesis and distal anterolateral hypocontractility.  The apical  contour raises concern for possible apical thrombus.  RECOMMENDATION: Medical therapy for concomitant CAD and mild to moderate LV dysfunction high potency statin therapy.  Optimal blood pressure control with target BP less than 130/80.  Recommend repeat echo Doppler study to evaluate for possible apical thrombus.  Recommend dual antiplatelet therapy with Aspirin 81mg  daily and Ticagrelor 90mg  twice daily long-term (beyond 12 months) because of Extensive stents in his LAD system and concomitant CAD.  Holter Monitor 08/22/2017  The patient was monitored for 48 hours. The patient was in sinus rhythm throughout the study. Minimum heart rate 52 bpm with maximum heart rate 116 bpm. There are very rare isolated PVCs totaling 7 throughout the 48-hour episode. No high-grade ectopy was demonstrated. There were no atrial arrhythmias. There were no pauses.  ASSESSMENT AND PLAN:  1.  Frequent palpitations with heart racing.  He has had several ED visits concerning the symptoms.  I have discussed the possibility of implantation of ILR as he has such frequent symptomatic complaints, and often subsiding by the time he gets to ED.  I have shown him an ILR device.  He wishes to think about it.  For now he wants to continue his current medication regimen.  I will not be doing any labs on him as he has had recent labs 2 weeks ago in the ED.  On review of EKG I did not see any evidence of WPW.  He denies caffeine or alcohol use.  We will check magnesium and TSH  2.  Coronary artery disease: History of STEMI in 2016.  History of DES to LAD with 3 overlapping drug-eluting stents.  He denies any angina type symptoms.  Continue current medication regimen at this time.  3.  Ischemic cardiomyopathy: EF of 35% to 40%.  He remains on Entresto at lowest dose as he is unable to tolerate higher dose.  May need to have repeat echocardiogram on follow-up planned with [name] in 6 months.  4.  Hyperlipidemia: We will get  lipids and LFTs today as he is fasting.  Goal of LDL less than 70.  No changes in statin therapy until labs are reviewed.  Current medicines are reviewed at length with the patient today.  I have spent 25 minutes dedicated to the care of this patient on the date of this encounter to include pre-visit review of records, assessment, management and diagnostic testing,with shared decision making.  Labs/ tests ordered today include: Fasting lipids LFTs, magnesium, TSH  Phill Myron. West Pugh, ANP, [facility]   05/30/2020 9:42 AM    [facility] [address] Office [phone] Fax [phone]  Notice: This dictation was prepared with Dragon dictation along with smaller phrase technology. Any transcriptional errors that result from this process are unintentional and may not be corrected upon review.

## 2020-05-30 ENCOUNTER — Ambulatory Visit: Payer: BC Managed Care – PPO | Admitting: Adult Health

## 2020-05-30 ENCOUNTER — Encounter: Payer: Self-pay | Admitting: Adult Health

## 2020-05-30 ENCOUNTER — Other Ambulatory Visit: Payer: Self-pay

## 2020-05-30 VITALS — BP 119/62 | HR 74 | Ht 72.0 in | Wt 213.2 lb

## 2020-05-30 DIAGNOSIS — I255 Ischemic cardiomyopathy: Secondary | ICD-10-CM

## 2020-05-30 DIAGNOSIS — E785 Hyperlipidemia, unspecified: Secondary | ICD-10-CM

## 2020-05-30 DIAGNOSIS — Z79899 Other long term (current) drug therapy: Secondary | ICD-10-CM

## 2020-05-30 DIAGNOSIS — I2510 Atherosclerotic heart disease of native coronary artery without angina pectoris: Secondary | ICD-10-CM

## 2020-05-30 DIAGNOSIS — R002 Palpitations: Secondary | ICD-10-CM | POA: Diagnosis not present

## 2020-05-30 LAB — HEPATIC FUNCTION PANEL
ALT: 16 IU/L (ref 0–44)
AST: 12 IU/L (ref 0–40)
Albumin: 4.5 g/dL (ref 3.8–4.9)
Alkaline Phosphatase: 114 IU/L (ref 44–121)
Bilirubin Total: 1.9 mg/dL — ABNORMAL HIGH (ref 0.0–1.2)
Bilirubin, Direct: 0.37 mg/dL (ref 0.00–0.40)
Total Protein: 7.1 g/dL (ref 6.0–8.5)

## 2020-05-30 LAB — LIPID PANEL
Chol/HDL Ratio: 3.3 ratio (ref 0.0–5.0)
Cholesterol, Total: 100 mg/dL (ref 100–199)
HDL: 30 mg/dL — ABNORMAL LOW (ref >39)
LDL Chol Calc (NIH): 40 mg/dL (ref 0–99)
Triglycerides: 183 mg/dL — ABNORMAL HIGH (ref 0–149)
VLDL Cholesterol Cal: 30 mg/dL (ref 5–40)

## 2020-05-30 LAB — TSH: TSH: 0.342 u[IU]/mL — ABNORMAL LOW (ref 0.450–4.500)

## 2020-05-30 LAB — MAGNESIUM: Magnesium: 2.1 mg/dL (ref 1.6–2.3)

## 2020-05-30 NOTE — Patient Instructions (Signed)
Medication Instructions:  Continue current medications  *If you need a refill on your cardiac medications before your next appointment, please call your pharmacy*   Lab Work: Fasting Lipid and Liver, Magnesium, TSH  If you have labs (blood work) drawn today and your tests are completely normal, you will receive your results only by: [name] MyChart Message (if you have MyChart) OR . A paper copy in the mail If you have any lab test that is abnormal or we need to change your treatment, we will call you to review the results.   Testing/Procedures: None Ordered   Follow-Up: At [facility], you and your health needs are our priority.  As part of our continuing mission to provide you with exceptional heart care, we have created designated Provider Care Teams.  These Care Teams include your primary Cardiologist (physician) and Advanced Practice Providers (APPs -  Physician Assistants and Nurse Practitioners) who all work together to provide you with the care you need, when you need it.  We recommend signing up for the patient portal called "MyChart".  Sign up information is provided on this After Visit Summary.  MyChart is used to connect with patients for Virtual Visits (Telemedicine).  Patients are able to view lab/test results, encounter notes, upcoming appointments, etc.  Non-urgent messages can be sent to your provider as well.   To learn more about what you can do with MyChart, go to [email or web address].    Your next appointment:   6 month(s)  The format for your next appointment:   In Person  Provider:   You may see [name], MD or one of the following Advanced Practice Providers on your designated Care Team:    Almyra Deforest, PA-C  Fabian Sharp, PA-C or   Roby Lofts, [place]

## 2020-06-06 ENCOUNTER — Other Ambulatory Visit: Payer: Self-pay | Admitting: Cardiovascular Disease

## 2020-07-25 ENCOUNTER — Telehealth: Payer: Self-pay | Admitting: Cardiovascular Disease

## 2020-07-25 NOTE — Telephone Encounter (Signed)
Left message for patient to contact his medical doctor about getting covid medications.

## 2020-07-25 NOTE — Telephone Encounter (Signed)
Patient called in to say that he tested positive for covid. Patient is wondering if he should get medication prescribe to him to help with covid or just waited out. Please advise

## 2020-08-04 ENCOUNTER — Telehealth: Payer: Self-pay | Admitting: Cardiovascular Disease

## 2020-08-04 MED ORDER — ATORVASTATIN CALCIUM 80 MG PO TABS: 80.0000 mg | ORAL_TABLET | Freq: Every day | ORAL | 1 refills | Status: DC

## 2020-08-04 NOTE — Telephone Encounter (Signed)
*  STAT* If patient is at the pharmacy, call can be transferred to refill team.   1. Which medications need to be refilled? (please list name of each medication and dose if known) atorvastatin (LIPITOR) 80 MG tablet  2. Which pharmacy/location (including street and city if local pharmacy) is medication to be sent to? [id number] - [place], [place] - [address]  3. Do they need a 30 day or 90 day supply? [place]

## 2020-08-04 NOTE — Telephone Encounter (Signed)
Called patient and he confirmed that he is taking lipitor 80 mg daily and that a 90 day supply was sent to his pharmacy [facility] in [facility] on [address]. He thanked me for calling.

## 2020-10-02 DIAGNOSIS — R079 Chest pain, unspecified: Secondary | ICD-10-CM | POA: Diagnosis not present

## 2020-10-02 DIAGNOSIS — R9431 Abnormal electrocardiogram [ECG] [EKG]: Secondary | ICD-10-CM | POA: Diagnosis not present

## 2020-10-02 DIAGNOSIS — R002 Palpitations: Secondary | ICD-10-CM | POA: Diagnosis not present

## 2020-10-02 DIAGNOSIS — Z87891 Personal history of nicotine dependence: Secondary | ICD-10-CM | POA: Diagnosis not present

## 2020-10-03 DIAGNOSIS — R079 Chest pain, unspecified: Secondary | ICD-10-CM | POA: Diagnosis not present

## 2020-10-06 ENCOUNTER — Other Ambulatory Visit: Payer: Self-pay

## 2020-10-06 ENCOUNTER — Encounter: Payer: Self-pay | Admitting: Cardiovascular Disease

## 2020-10-06 ENCOUNTER — Ambulatory Visit: Payer: BC Managed Care – PPO | Admitting: Cardiovascular Disease

## 2020-10-06 DIAGNOSIS — I2510 Atherosclerotic heart disease of native coronary artery without angina pectoris: Secondary | ICD-10-CM | POA: Diagnosis not present

## 2020-10-06 DIAGNOSIS — R002 Palpitations: Secondary | ICD-10-CM

## 2020-10-06 DIAGNOSIS — I255 Ischemic cardiomyopathy: Secondary | ICD-10-CM | POA: Diagnosis not present

## 2020-10-06 DIAGNOSIS — E041 Nontoxic single thyroid nodule: Secondary | ICD-10-CM | POA: Diagnosis not present

## 2020-10-06 DIAGNOSIS — Z79899 Other long term (current) drug therapy: Secondary | ICD-10-CM

## 2020-10-06 DIAGNOSIS — E782 Mixed hyperlipidemia: Secondary | ICD-10-CM

## 2020-10-06 DIAGNOSIS — Z9861 Coronary angioplasty status: Secondary | ICD-10-CM

## 2020-10-06 DIAGNOSIS — E785 Hyperlipidemia, unspecified: Secondary | ICD-10-CM

## 2020-10-06 DIAGNOSIS — R7989 Other specified abnormal findings of blood chemistry: Secondary | ICD-10-CM

## 2020-10-06 LAB — LIPID PANEL
Chol/HDL Ratio: 3 ratio (ref 0.0–5.0)
Cholesterol, Total: 84 mg/dL — ABNORMAL LOW (ref 100–199)
HDL: 28 mg/dL — ABNORMAL LOW (ref >39)
LDL Chol Calc (NIH): 36 mg/dL (ref 0–99)
Triglycerides: 105 mg/dL (ref 0–149)
VLDL Cholesterol Cal: 20 mg/dL (ref 5–40)

## 2020-10-06 LAB — T3, FREE: T3, Free: 3.5 pg/mL (ref 2.0–4.4)

## 2020-10-06 LAB — TSH: TSH: 0.503 u[IU]/mL (ref 0.450–4.500)

## 2020-10-06 LAB — T4, FREE: Free T4: 1.38 ng/dL (ref 0.82–1.77)

## 2020-10-06 NOTE — Patient Instructions (Signed)
Medication Instructions:  Continue current medications  *If you need a refill on your cardiac medications before your next appointment, please call your pharmacy*   Lab Work: TSH, Free T4 Free T3 and Fasting Lipid  If you have labs (blood work) drawn today and your tests are completely normal, you will receive your results only by: [place] (if you have MyChart) OR A paper copy in the mail If you have any lab test that is abnormal or we need to change your treatment, we will call you to review the results.   Testing/Procedures: None Ordered   Follow-Up: At [facility], you and your health needs are our priority.  As part of our continuing mission to provide you with exceptional heart care, we have created designated Provider Care Teams.  These Care Teams include your primary Cardiologist (physician) and Advanced Practice Providers (APPs -  Physician Assistants and Nurse Practitioners) who all work together to provide you with the care you need, when you need it.  We recommend signing up for the patient portal called "MyChart".  Sign up information is provided on this After Visit Summary.  MyChart is used to connect with patients for Virtual Visits (Telemedicine).  Patients are able to view lab/test results, encounter notes, upcoming appointments, etc.  Non-urgent messages can be sent to your provider as well.   To learn more about what you can do with MyChart, go to [email or web address].    Your next appointment:   6 month(s)  The format for your next appointment:   In Person  Provider:   You may see [name], MD or one of the following Advanced Practice Providers on your designated Care Team:   Almyra Deforest, PA-C Fabian Sharp, PA-C or  Roby Lofts, [place]

## 2020-10-06 NOTE — Progress Notes (Signed)
Patient ID: Cesar Woods, male   DOB: 04/02/1965, 54 y.o.   MRN: [id number]    Primary MD: [name]  HPI: Cesar Woods is a 55 y.o. male who presents to the office today for a 9 month follow-up cardiology evaluation.  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  In December, he developed substernal chest discomfort which he initially attributed to GERD.  This continued to recur over that day.  The following day he went to work and continued to experience vague discomfort.  He was seen by his primary physician, Dr. Scarlette Ar in his ECG was abnormal.  Changes of an anterolateral MI.  He presented to the emergency room.  He was not having any chest pain or shortness of breath or palpitations.  He underwent cardiac catheterization the following morning on 01/15/2015 which revealed significant CAD with 90% ostial LAD stenosis followed by 99% proximal LAD stenosis in the region of the first septal perforating artery with significant thrombus.  There was diffuse 50% stenosis followed by 95% ulcerated plaque in the proximal to mid LAD.  There also appeared to be an apical LAD dissection.  40% ramus intermediate, like high marginal stenosis and a large dominant RCA with 30% mid stenosis.  Large PDA and PLA vessels with septal collateralization to the proximal LAD.  He was felt to have an out of hospital late presentation anterolateral infarction secondary to his multiple high-grade stenoses.  He underwent a very difficult but successful intervention to his diffusely diseased LAD.  There was evidence for a spiral dissection in the mid distal LAD but ultimately was insertion of 3 tandem synergy DES stents (0.538, 3.038, and 3.020 mm from the mid LAD to the ostium and PTCA of the distal LAD with low-level inflation tach up his apical initial dissection.  There was total occlusion of the diagonal vessel  antegrade but there was significant development of retrograde collaterals to this diagonal vessel from the distal LAD.  Subsequent, he stabilized well.  He was seen in follow-up by Tenny Craw on 01/24/2015 and was doing well without chest pain or shortness of breath.  At that time his blood pressure was controlled.  He participated in the cardiac rehabilitation program.  He is back at work and has a stressful job in Engineer, mining for [employer].   When I saw him for follow-up evaluation in March 2017.  His ECG showed Q waves in V1 through V4 4 with persistent T-wave inversion anteriorly and anterolaterally.  I recommended that he discontinue metoprolol succinate and changed him to carvedilol.  He is now on carvedilol at 6.25 mg twice a day and is tolerating this well.  He continues to be on low-dose losartan 12.5 mg.  I scheduled him for follow-up echo Doppler study to reassess his LV function.  This was done on 05/30/2015.  Ejection fraction was now 40-45% and there was evidence for akinesis of the apical myocardium.  There was grade 1 diastolic dysfunction.  When I saw him in May 2017 for follow-up evaluation, he denied recurrent chest pain, PND or orthopnea.  At time, I slowly titrated ARB therapy and increase losartan to 25 mg at bedtime.  So increased his carvedilol to 9.375 mg twice a day for 2 weeks and further titrated this to 12.5 mg daily.   He developed a recurrent episode of chest pain in October 2017 and was hospitalized.  A nuclear perfusion study was performed which showed his previous large fixed defect involving the apex extending towards the mid ventricle in the anterior and lateral wall.  There is very minimal if any peri-infarct ischemia with an EF of 43%.  These were felt to be consistent with his prior MI.    Since I  saw him in March 2018 he had been evaluated in the emergency room with intermittent chest and back pain.  He had undergone a stress Myoview study in January 2019 which  showed an EF of 40% with a large defect consistent with his prior LAD infarction and there was no significant ischemia except mild peri-infarction ischemia.  In July 2019 he developed recurrent symptomatology and when I was on vacation and underwent repeat catheterization by Dr. Ellyn Hack.  He was found to have 85% narrowing in the midportion of his long LAD stent in the region of the diagonal takeoff.  The diagonal vessel was occluded but had excellent collaterals.  He was rehospitalized in October 2019 with recurrent chest pain.  At that time on November 09, 2017 I performed repeat cardiac catheterization.  This revealed a widely patent LAD stent with mild residual narrowing of 20% at the site of recent in-stent restenosis that was treated with Cutting Balloon in July 2019.  The diagonal branch which arose from this segment has both antegrade filling and extensive retrograde collateralization being filled from the apical LAD.  The left circumflex marginal vessel had 30 and 35% proximal to mid stenoses.  The RCA was a large dominant vessel with smooth 20% narrowing.  He had mild to moderate LV dysfunction and his ejection fraction was improved to 40 to 45% with apical kinesis and distal anterolateral hypocontractility.  His apical contour raised concern for possible apical thrombus.  An echo Doppler study was suspicious for a layer of mural thrombus apically which was different from July 2019.  As result he was started on apixaban.  I saw him for follow-up on November 28, 2017 at which time he continued to remain stable and has been without chest pain.  He is on Eliquis 5 mg, Plavix 75 mg, carvedilol 25 mg twice a day, atorvastatin 80 mg and has been on losartan 25 mg daily.  He denies chest tightness, baseline shortness of breath but had noted some mild shortness of breath with exertion.  During that evaluation, I discontinued losartan and initiated Entresto.  He took 24/25 mg twice daily for 2 weeks and was then  further titrated up to 49/51 mg twice a day.  He has continued to be on atorvastatin 80 mg with target LDL less than 70.  I  saw him in December 2019 at which time he was feeling improved with initiation of Entresto since initiating.  Over the past year, he has been titrated up to maximal dosing at 97/103 mg twice a day.  He has continued to take carvedilol 25 mg twice a day.  He has been on Eliquis 5 mg twice a day with Plavix and has been on Prilosec.  He was evaluated in August 2020 by Dr. Davina Poke prior to undergoing colonoscopy given instructions to hold his Plavix and Eliquis.  The past he was documented to have an apical mural thrombus and on his most recent echo in March 2020 EF was 40 to 45%.  There was septal and apical akinesis secondary to his prior MI.  There was no apical thrombus noted which had resolved from his previous go.  During  that evaluation blood pressure was low.  When I saw him on January 03, 2019  he had noticed some episodes of mild dizziness.  He denied any palpitations or awareness of arrhythmia.  He had experienced some depression secondary to the COVID-19 pandemic.    During that evaluation his blood pressure was low and on exam there was a 10 mm orthostatic drop from supine to standing.  Consequently I reduced his Entresto back to 49/51 mg twice a day.  Since it was well over a year from his ACS and stenting I recommended discontinuance of Plavix and resuming aspirin 81 mg to take along with Eliquis 5 mg twice a day.  He underwent a follow-up echo Doppler study on April 30, 2019 which showed an EF of 35 to 40%.  There was grade 1 diastolic dysfunction.  There was severe akinesis of the apex and distal anteroseptal new from lateral wall.  Definity contrast was used and there was no apical thrombus.  I saw him in April 2021. At that time, he felt well and had both Covid vaccinations.  He was be planning to undergo a colonoscopy in May.  He denied chest pain PND orthopnea.   He denied any orthostatic symptoms.  During that evaluation, he apparently was now on a further reduced dose of Entresto 24/26. His blood pressure was stable. I reviewed his echo Doppler study which showed an EF of 35 to 40% without thrombus. As result I discontinued Eliquis and recommend mended resumption of Plavix plus aspirin. I did recommend he undergo a C5E52 test to make certain his Plavix responsive.  I last saw him in November 2021.  He had developed some episodes of palpitations in early August 2021. He went to the emergency room. Laboratory was drawn. Troponin was negative. BNP was excellent at 24.4. There was a 13-hour wait and ultimately he ended up being told to leave since his labs were normal. He did subsequently wear a monitor from August 22 through October 29, 2019 which showed predominant sinus rhythm at 78 bpm. Slowest heart rate was 60 bpm which was sinus rhythm and his fastest heart rate was sinus tachycardia at 126 bpm which occurred at 12:55 AM. Subsequently, he has felt well. He denied chest pain PND orthopnea. He realized today that he had never had a P2 Y12 test done and he plans to do this.   Since I last saw him, he had developed episodes of some palpitations and went to the ED in [place] on May 08, 2020.  Troponins were negative.  Heart rate was increased at 110.  Had another episode where he noticed his heart rate increasing at a concert in [place].  He was seen on May 30, 2020 by Jory Sims, NP.  During her how evaluation he was stable.  He had undergone an echo Doppler study on April 29, 2020 which showed an EF at 35 to 77% grade 1 diastolic dysfunction.  Laboratory in April 2022 showed total cholesterol 100 triglycerides 183 HDL 30 and LDL 40.  Sh was slightly over suppressed at 0.34.  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  He plans to go to [place] in [place] this weekend with his wife.  Last week he noted some twinges of nonexertional which occurred when he was sitting down and also an occasional skipped beat.  He apparently went to [facility] ER and was  stable.  He presents today for evaluation   Past Medical History:  Diagnosis Date   ACS (acute coronary syndrome) ([place]) 11/2015   Atypical chest pain    Negative Myoview 2010   Coronary artery disease    a.s/p STEMI in 01/2015 requiring placement of 4 DES to LAD. b. 11/2015: NST showing apical scar with minimal peri-infarct ischemia.   Essential hypertension    Food allergy    Anaphylaxis with shell fish   GERD (gastroesophageal reflux disease)    History of migraines    Hyperlipidemia    Ischemic cardiomyopathy    a. Echo 05/2015: EF 40-45%, apical akineiss with Grade 1 DD.   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 Risa Grill; had bilateral orchiectomy and XRT in past    Past Surgical History:  Procedure Laterality Date   CARDIAC CATHETERIZATION N/A 01/15/2015   Procedure: Left Heart Cath and Coronary Angiography;  Surgeon: Troy Sine, MD;  Location: [place] [facility] LAB;  Service: Cardiovascular;  Laterality: N/A;   CARDIAC CATHETERIZATION  01/15/2015   Procedure: Coronary Stent Intervention;  Surgeon: Troy Sine, MD;  Location: [place] CV LAB;  Service: Cardiovascular;;   CORONARY BALLOON ANGIOPLASTY N/A 08/17/2017   Procedure: CORONARY BALLOON ANGIOPLASTY;  Surgeon: Leonie Man, MD;  Location: [place] CV LAB;  Service: Cardiovascular;  Laterality: N/A;   LEFT HEART CATH AND CORONARY ANGIOGRAPHY N/A 08/17/2017   Procedure: LEFT HEART CATH AND CORONARY ANGIOGRAPHY;  Surgeon: Leonie Man, MD;  Location: [place] CV LAB;  Service: Cardiovascular;  Laterality: N/A;   LEFT HEART CATH AND CORONARY ANGIOGRAPHY N/A 11/09/2017   Procedure: LEFT HEART CATH AND CORONARY  ANGIOGRAPHY;  Surgeon: Troy Sine, MD;  Location: [place] CV LAB;  Service: Cardiovascular;  Laterality: N/A;   ORCHIECTOMY Bilateral     Allergies  Allergen Reactions   Iodine Anaphylaxis    Shellfish allergy    Shellfish Allergy Anaphylaxis   Penicillins Other (See Comments)    Childhood allergy Has patient had a PCN reaction causing immediate rash, facial/tongue/throat swelling, SOB or lightheadedness with hypotension: Unknown Has patient had a PCN reaction causing severe rash involving mucus membranes or skin necrosis: Unknown Has patient had a PCN reaction that required hospitalization: No Has patient had a PCN reaction occurring within the last 10 years: No If all of the above answers are "NO", then may proceed with Cephalosporin use.     Ace Inhibitors Cough    Current Outpatient Medications  Medication Sig Dispense Refill   aspirin EC 81 MG tablet Take 1 tablet (81 mg total) by mouth daily. (Patient taking differently: Take 81 mg by mouth at bedtime.) 90 tablet 3   atorvastatin (LIPITOR) 80 MG tablet Take 1 tablet (80 mg total) by mouth daily. 90 tablet 1   carvedilol (COREG) 25 MG tablet Take 1 tablet (25 mg total) by mouth 2 (two) times daily with a meal. 60 tablet 8   clopidogrel (PLAVIX) 75 MG tablet TAKE 1 TABLET(75 MG) BY MOUTH DAILY 90 tablet 3   EPIPEN 2-PAK 0.3 MG/0.3ML SOAJ injection INJECT 0.3 MLS INTO THE MUSCLE ONCE **PATIENT NEEDS TO SCHEDULE AN OFFICE VISIT** (Patient taking differently: Inject 0.3 mg into the muscle as needed (for an allergic reaction).) 2 Device 0   nitroGLYCERIN (NITROSTAT) 0.4 MG SL tablet PLACE 1 TABLET BY MOUTH UNDER THE TONGUE EVERY 5 MINUTES FOR 3 DOSES AS NEEDED FOR CHEST  PAIN 25 tablet 2   omeprazole (PRILOSEC) 20 MG capsule Take 20 mg by mouth at bedtime.      sacubitril-valsartan (ENTRESTO) 24-26 MG Take 1 tablet by mouth 2 (two) times daily. 180 tablet 1   Testosterone 1.62 % GEL Apply 4 Pump topically daily.      No  current facility-administered medications for this visit.    Social History   Socioeconomic History   Marital status: Married    Spouse name: Not on file   Number of children: Not on file   Years of education: Not on file   Highest education level: Not on file  Occupational History   Not on file  Tobacco Use   Smoking status: Former   Smokeless tobacco: Never   Tobacco comments:    quit around 2010  Vaping Use   Vaping Use: Never used  Substance and Sexual Activity   Alcohol use: Yes    Alcohol/week: 0.0 standard drinks    Comment: occasionally   Drug use: No    Comment: remote use of marijuana, quit a long time ago   Sexual activity: Not on file  Other Topics Concern   Not on file  Social History Narrative   Occupation:Senior Trader for [employer]   Divorced    No children      Former smoker    Alcohol Use - yes         Social Determinants of Health   Financial Resource Strain: Not on file  Food Insecurity: Not on file  Transportation Needs: Not on file  Physical Activity: Not on file  Stress: Not on file  Social Connections: Not on file  Intimate Partner Violence: Not on file   Additional social history is notable that he was born in [place], [place].  He lived for over a decade in [place].  He is married.  Former smoker.  Family History  Problem Relation Age of Onset   Heart attack Father        CABG at agae 23   Stroke Paternal Grandfather    Hypertension Other    Hyperlipidemia Other    Heart attack Maternal Grandmother     ROS General: Negative; No fevers, chills, or night sweats HEENT: Negative; No changes in vision or hearing, sinus congestion, difficulty swallowing Pulmonary: Negative; No cough, wheezing, shortness of breath, hemoptysis Cardiovascular: See HPI:  GI: Negative; No nausea, vomiting, diarrhea, or abdominal pain GU: History of testicular cancer, status post orchiectomy. Musculoskeletal: Negative; no myalgias, joint pain, or  weakness Hematologic: Negative; no easy bruising, bleeding Endocrine: Negative; no heat/cold intolerance; no diabetes, Neuro: Negative; no changes in balance, headaches Skin: Negative; No rashes or skin lesions Psychiatric: Negative; No behavioral problems, depression Sleep: Negative; No snoring,  daytime sleepiness, hypersomnolence, bruxism, restless legs, hypnogognic hallucinations. Other comprehensive 14 point system review is negative   Physical Exam BP 119/75   Pulse 78   Ht 6' (1.829 m)   Wt 211 lb (95.7 kg)   SpO2 95%   BMI 28.62 kg/m    Repeat blood pressure by me was 110/70 supine and 106/66 standing      Wt Readings from Last 3 Encounters:  10/06/20 211 lb (95.7 kg)  05/30/20 213 lb 3.2 oz (96.7 kg)  12/28/19 217 lb (98.4 kg)   General: Alert, oriented, no distress.  Skin: normal turgor, no rashes, warm and dry HEENT: Normocephalic, atraumatic. Pupils equal round and reactive to light; sclera anicteric; extraocular muscles intact;  Nose without  nasal septal hypertrophy Mouth/Parynx benign; Mallinpatti scale 3 Neck: No JVD, no carotid bruits; normal carotid upstroke Lungs: clear to ausculatation and percussion; no wheezing or rales Chest wall: without tenderness to palpitation Heart: PMI not displaced, RRR, s1 s2 normal, 1/6 systolic murmur, no diastolic murmur, no rubs, gallops, thrills, or heaves Abdomen: soft, nontender; no hepatosplenomehaly, BS+; abdominal aorta nontender and not dilated by palpation. Back: no CVA tenderness Pulses 2+ Musculoskeletal: full range of motion, normal strength, no joint deformities Extremities: no clubbing cyanosis or edema, Homan's sign negative  Neurologic: grossly nonfocal; Cranial nerves grossly wnl Psychologic: Normal mood and affect    October 06, 2020 ECG (independently read by me):  NSR at 78; possible LAE, QS V1-5 c/w prior anterior MI  November 2021 ECG (independently read by me): Normal sinus rhythm at 78 bpm.  QS  complex V1 through V4 unchanged.  No ectopy.  April 2021 ECG (independently read by me): NSR at 72; QS V1-4 c/w prior anterior MI: no ectopy; normal intervals  November 2020 ECG (independently read by me): Normal sinus rhythm at 78 bpm.  QS complex V1 through V4 consistent with prior anterior MI.  No ectopy.  QTc interval 389 ms.  Parable '1 7 8 ' ms  December 2019 ECG (independently read by me): Normal sinus rhythm at 75 bpm.  QS complex V1 through V4 consistent with old anterior MI.  Normal intervals.  No ectopy.  November 28, 2017 ECG (independently read by me): Normal sinus rhythm at 83 bpm.  QS complex V1 through V5 consistent with old anterior MI.  Normal intervals.  No ectopy.  March 2019 ECG (independently read by me): Normal sinus rhythm at 79 bpm.  Old anterolateral infarct changes.  QRS complex V1 through V5; normal intervals.  No significant ST changes.  January 2018 ECG (independently read by me): Normal sinus rhythm at 92 bpm.  Anterior Q waves consistent with his prior anterolateral MI.  September 2017 ECG (independently read by me): Sinus rhythm at 83 bpm.  QRS complex V1-V6  concordant with his large anterolateral MI.  May 2017 ECG (independently read by me): Normal sinus rhythm at 79 bpm.  T-wave inversion V2 through V6 and 1 and aVL concordant with his prior anterior to anterolateral MI.  March 2017 ECG (independently read by me): NSR at 80; QS V1-4 with T wave inversion V1-V6, I and aVL  LABS:  BMP Latest Ref Rng & Units 09/10/2019 08/27/2019 05/29/2019  Glucose 70 - 99 mg/dL 122(H) 139(H) 98  BUN 6 - 20 mg/dL '13 14 17  ' Creatinine 0.61 - 1.24 mg/dL 0.91 0.87 0.80  BUN/Creat Ratio 9 - 20 - - 21(H)  Sodium 135 - 145 mmol/L 138 142 142  Potassium 3.5 - 5.1 mmol/L 3.9 3.8 4.4  Chloride 98 - 111 mmol/L 103 107 103  CO2 22 - 32 mmol/L '26 27 25  ' Calcium 8.9 - 10.3 mg/dL 8.8(L) 8.9 8.8     Hepatic Function Latest Ref Rng & Units 05/30/2020 05/29/2019 02/07/2018  Total Protein  6.0 - 8.5 g/dL 7.1 6.5 6.3  Albumin 3.8 - 4.9 g/dL 4.5 4.3 4.3  AST 0 - 40 IU/L '12 13 15  ' ALT 0 - 44 IU/L '16 14 21  ' Alk Phosphatase 44 - 121 IU/L 114 102 97  Total Bilirubin 0.0 - 1.2 mg/dL 1.9(H) 1.7(H) 1.3(H)  Bilirubin, Direct 0.00 - 0.40 mg/dL 0.37 - -    CBC Latest Ref Rng & Units 09/10/2019 08/27/2019 05/29/2019  WBC 4.0 -  10.5 K/uL 8.4 8.5 7.5  Hemoglobin 13.0 - 17.0 g/dL 14.7 14.2 14.3  Hematocrit 39.0 - 52.0 % 44.0 43.5 42.3  Platelets 150 - 400 K/uL 193 194 168   Lab Results  Component Value Date   MCV 89.1 09/10/2019   MCV 89.9 08/27/2019   MCV 88 05/29/2019    Lab Results  Component Value Date   TSH 0.503 10/06/2020    BNP    Component Value Date/Time   BNP 24.4 09/10/2019 1147   BNP <2.0 06/26/2010 1334    ProBNP    Component Value Date/Time   PROBNP 46 02/07/2018 1216   PROBNP <2.0 pg/mL 10/17/2009 1834     Lipid Panel     Component Value Date/Time   CHOL 84 (L) 10/06/2020 1032   TRIG 105 10/06/2020 1032   HDL 28 (L) 10/06/2020 1032   CHOLHDL 3.0 10/06/2020 1032   CHOLHDL 3.5 11/09/2017 0356   VLDL 27 11/09/2017 0356   LDLCALC 36 10/06/2020 1032   LDLDIRECT 99.9 09/15/2012 1738     RADIOLOGY: No results found.  IMPRESSION:  1. Ischemic cardiomyopathy   2. Coronary artery disease involving native coronary artery of native heart without angina pectoris   3. CAD S/P percutaneous coronary angioplasty   4. Palpitations   5. Mixed hyperlipidemia   6. Hyperlipidemia LDL goal <70   7. Low TSH level   8. THYROID NODULE, RIGHT   9. Gilbert's syndrome   10. Medication management     ASSESSMENT AND PLAN: Cesar. Kylee Nardozzi is a 54 year old white male who presented to [facility] on the evening of 01/14/2015 with late presentation following an anterolateral MI which most likely occurred the day prior to his presentation.  In the emergency room, he was pain-free. Cardiac catheterization revealed severe diffuse CAD with ulcerated plaque and  thrombus and evidence for spontaneous distal LAD dissection.  He had a very difficult but successful PCI to his LAD, and ultimate insertion of 3 Synergy DES stents covering a total of 96 mm and extending from the ostium to the mid LAD.  He also had PTCA of his distal LAD dissection.  A echo Doppler showed an EF of 40-45% and apical akinesis.  He was without  anginal symptomatology until July 2019 when he was found to have very focal 85% in-stent restenosis in the LAD stent at the region of the diagonal vessel with occlusion of the diagonal.  He had excellent collateralization to this diagonal vessel.  He underwent successful cutting balloon.  His last catheterization was on November 09, 2017 which demonstrated patent LAD stents  and there was now both antegrade and extensive retrograde collateralization to the diagonal vessel.  Subsequently he was without recurrent angina.  He was transitioned from losartan to [facility].  He was treated with Eliquis due to apical thrombus which on his most  echo Doppler study in March 2021 had resolved.  EF was 35 to 40% with grade 1 diastolic dysfunction and there was severe akinesis of the apex and distal anteroseptal lateral wall.  He had developed increasing palpitations leading to a visit to the ER.  Laboratory was negative and he was never fully evaluated.  A subsequent Holter monitor did not reveal any significant ectopy with predominant sinus rhythm with an average rate at 78, slowest heart rate at sinus bradycardia at 60 bpm and fastest heart rate being sinus tachycardia at 126 bpm.  Most recently, his blood pressure has remained on the low side preventing further titration  of Entresto to 49/51 mL grams.  I reviewed his evaluation with Jory Sims from April.  I also discussed his ER evaluations.  Recently he has experienced some episodes of isolated palpitations and twinges of chest pain not ischemic in etiology.  He continues to be on carvedilol 25 mg twice a day.  ECG  today shows pulse in the 70s.  His blood pressure is soft on his current Entresto 24/26 mg twice daily precluding further titration.  Most recent TSH in April was slightly over suppressed raising concern for possible hyperthyroidism.  I am recommending we obtain TSH, free T4, free T3, and have recommended follow-up lipid studies fasting due to his mixed hyperlipidemia lipids in April revealed an excellent LDL at 40 but triglycerides were elevated at 183 and HDL was low at 30.  I have suggested he may need endocrinologic evaluation.  He has been documented to have elevated total bilirubin which most likely is indirect and suggestive of possible Gilbert's syndrome.  Clinically he is stable.  He will be traveling to [place] this weekend for the next week.  He continues to be on DAPT with aspirin/Plavix.  He takes omeprazole for GERD.  His primary care doctor, Dr. Scarlette Ar has retired.  He currently is in search for a new primary care provider.  I will see him in 4 to 6 months for follow-up evaluation.   Troy Sine, MD, [facility]  10/07/2020 11:21 AM

## 2020-10-07 ENCOUNTER — Encounter: Payer: Self-pay | Admitting: Cardiovascular Disease

## 2020-11-02 ENCOUNTER — Other Ambulatory Visit: Payer: Self-pay | Admitting: Cardiovascular Disease

## 2020-11-03 DIAGNOSIS — Z8547 Personal history of malignant neoplasm of testis: Secondary | ICD-10-CM | POA: Diagnosis not present

## 2020-11-03 DIAGNOSIS — R7989 Other specified abnormal findings of blood chemistry: Secondary | ICD-10-CM | POA: Diagnosis not present

## 2020-11-04 ENCOUNTER — Other Ambulatory Visit: Payer: Self-pay | Admitting: *Deleted

## 2020-11-04 MED ORDER — ENTRESTO 24-26 MG PO TABS: 1.0000 | ORAL_TABLET | Freq: Two times a day (BID) | ORAL | 2 refills | Status: DC

## 2021-01-27 ENCOUNTER — Other Ambulatory Visit: Payer: Self-pay

## 2021-01-27 MED ORDER — ATORVASTATIN CALCIUM 80 MG PO TABS: 80.0000 mg | ORAL_TABLET | Freq: Every day | ORAL | 0 refills | Status: DC

## 2021-03-01 ENCOUNTER — Encounter (HOSPITAL_BASED_OUTPATIENT_CLINIC_OR_DEPARTMENT_OTHER): Payer: Self-pay | Admitting: Emergency Medicine

## 2021-03-01 ENCOUNTER — Emergency Department (HOSPITAL_BASED_OUTPATIENT_CLINIC_OR_DEPARTMENT_OTHER)
Admission: EM | Admit: 2021-03-01 | Discharge: 2021-03-01 | Disposition: A | Discharge status: Home / Self Care | Payer: BC Managed Care – PPO | Attending: Emergency Medicine | Admitting: Emergency Medicine

## 2021-03-01 ENCOUNTER — Other Ambulatory Visit: Payer: Self-pay

## 2021-03-01 ENCOUNTER — Emergency Department (HOSPITAL_BASED_OUTPATIENT_CLINIC_OR_DEPARTMENT_OTHER): Payer: BC Managed Care – PPO | Admitting: Radiology

## 2021-03-01 DIAGNOSIS — Z7982 Long term (current) use of aspirin: Secondary | ICD-10-CM | POA: Insufficient documentation

## 2021-03-01 DIAGNOSIS — Z7902 Long term (current) use of antithrombotics/antiplatelets: Secondary | ICD-10-CM | POA: Diagnosis not present

## 2021-03-01 DIAGNOSIS — R0602 Shortness of breath: Secondary | ICD-10-CM | POA: Insufficient documentation

## 2021-03-01 DIAGNOSIS — R079 Chest pain, unspecified: Secondary | ICD-10-CM | POA: Diagnosis not present

## 2021-03-01 DIAGNOSIS — I509 Heart failure, unspecified: Secondary | ICD-10-CM | POA: Diagnosis not present

## 2021-03-01 DIAGNOSIS — R0789 Other chest pain: Secondary | ICD-10-CM | POA: Diagnosis not present

## 2021-03-01 DIAGNOSIS — Z79899 Other long term (current) drug therapy: Secondary | ICD-10-CM | POA: Diagnosis not present

## 2021-03-01 DIAGNOSIS — I2510 Atherosclerotic heart disease of native coronary artery without angina pectoris: Secondary | ICD-10-CM | POA: Diagnosis not present

## 2021-03-01 LAB — CBC
HCT: 41.1 % (ref 39.0–52.0)
Hemoglobin: 13.8 g/dL (ref 13.0–17.0)
MCH: 29.6 pg (ref 26.0–34.0)
MCHC: 33.6 g/dL (ref 30.0–36.0)
MCV: 88.2 fL (ref 80.0–100.0)
Platelets: 163 10*3/uL (ref 150–400)
RBC: 4.66 MIL/uL (ref 4.22–5.81)
RDW: 12.6 % (ref 11.5–15.5)
WBC: 9.8 10*3/uL (ref 4.0–10.5)
nRBC: 0 % (ref 0.0–0.2)

## 2021-03-01 LAB — BASIC METABOLIC PANEL
Anion gap: 8 (ref 5–15)
BUN: 18 mg/dL (ref 6–20)
CO2: 28 mmol/L (ref 22–32)
Calcium: 9.1 mg/dL (ref 8.9–10.3)
Chloride: 104 mmol/L (ref 98–111)
Creatinine, Ser: 0.78 mg/dL (ref 0.61–1.24)
GFR, Estimated: >60 mL/min (ref >60)
Glucose, Bld: 107 mg/dL — ABNORMAL HIGH (ref 70–99)
Potassium: 3.9 mmol/L (ref 3.5–5.1)
Sodium: 140 mmol/L (ref 135–145)

## 2021-03-01 LAB — BRAIN NATRIURETIC PEPTIDE: B Natriuretic Peptide: 12.1 pg/mL (ref 0.0–100.0)

## 2021-03-01 LAB — TROPONIN I (HIGH SENSITIVITY)
Troponin I (High Sensitivity): 4 ng/L (ref <18)
Troponin I (High Sensitivity): 4 ng/L (ref <18)

## 2021-03-01 MED ORDER — ASPIRIN 325 MG PO TABS
325.0000 mg | ORAL_TABLET | Freq: Every day | ORAL | Status: DC
  Administered 2021-03-01: 325 mg via ORAL
  Filled 2021-03-01: qty 1

## 2021-03-01 NOTE — ED Provider Notes (Signed)
[facility] EMERGENCY DEPT Provider Note   CSN: [id number] Arrival date & time: 03/01/21  1124     History  Chief Complaint  Patient presents with   Chest Pain    Cesar Woods is a 56 y.o. male.  HPI   Pt is a 56 y/o male wit ha h/o CAD, STEMI, CHF who presents to the ED c/o chest pain x1 moht. Describes pain as a twinge that is sharp in nature.  The pain last for a few seconds at a time and resolves on its own.  Rates pain at a 3-05/2008 when he has not.  It happens sporadically and does not seem to be associated with exertion, eating or any other specific activity.  The pain does not radiate.  He does report that she has chronic shortness of breath which is unchanged.  He has had orthopnea for the last month but denies any bilateral lower extremity swelling or weight gain.  He has had some intermittent nausea and vomiting which is occurred over the last week or so but since resolved.   Home Medications Prior to Admission medications   Medication Sig Start Date End Date Taking? Authorizing Provider  aspirin EC 81 MG tablet Take 1 tablet (81 mg total) by mouth daily. Patient taking differently: Take 81 mg by mouth at bedtime. 01/03/19   Troy Sine, MD  atorvastatin (LIPITOR) 80 MG tablet Take 1 tablet (80 mg total) by mouth daily. 01/27/21   Troy Sine, MD  carvedilol (COREG) 25 MG tablet TAKE 1 TABLET(25 MG) BY MOUTH TWICE DAILY WITH A MEAL 11/03/20   Troy Sine, MD  clopidogrel (PLAVIX) 75 MG tablet TAKE 1 TABLET(75 MG) BY MOUTH DAILY 06/06/20   Troy Sine, MD  EPIPEN 2-PAK 0.3 MG/0.3ML SOAJ injection INJECT 0.3 MLS INTO THE MUSCLE ONCE **PATIENT NEEDS TO SCHEDULE AN OFFICE VISIT** Patient taking differently: Inject 0.3 mg into the muscle as needed (for an allergic reaction). 06/12/13   Yoo, Doe-Hyun R, DO  nitroGLYCERIN (NITROSTAT) 0.4 MG SL tablet PLACE 1 TABLET BY MOUTH UNDER THE TONGUE EVERY 5 MINUTES FOR 3 DOSES AS NEEDED FOR CHEST PAIN 12/28/19    Troy Sine, MD  omeprazole (PRILOSEC) 20 MG capsule Take 20 mg by mouth at bedtime.     [provider]  sacubitril-valsartan (ENTRESTO) 24-26 MG Take 1 tablet by mouth 2 (two) times daily. 11/04/20   Almyra Deforest, PA  Testosterone 1.62 % GEL Apply 4 Pump topically daily.  05/04/19   [provider]      Allergies    Iodine, Shellfish allergy, Penicillins, and Ace inhibitors    Review of Systems   Review of Systems See HPI for pertinent positives or negatives.   Physical Exam Updated Vital Signs BP 111/82    Pulse 76    Temp (!) 97.4 F (36.3 C)    Resp 13    Ht 6' (1.829 m)    Wt 95.3 kg    SpO2 96%    BMI 28.48 kg/m  Physical Exam Vitals and nursing note reviewed.  Constitutional:      General: He is not in acute distress.    Appearance: He is well-developed.  HENT:     Head: Normocephalic and atraumatic.  Eyes:     Conjunctiva/sclera: Conjunctivae normal.  Cardiovascular:     Rate and Rhythm: Normal rate and regular rhythm.     Heart sounds: Normal heart sounds. No murmur heard. Pulmonary:  Effort: Pulmonary effort is normal. No respiratory distress.     Breath sounds: Normal breath sounds. No decreased breath sounds, wheezing or rhonchi.  Abdominal:     Palpations: Abdomen is soft.     Tenderness: There is no abdominal tenderness.  Musculoskeletal:        General: No swelling.     Cervical back: Neck supple.     Right lower leg: No tenderness. No edema.     Left lower leg: No tenderness. No edema.  Skin:    General: Skin is warm and dry.     Capillary Refill: Capillary refill takes less than 2 seconds.  Neurological:     Mental Status: He is alert.  Psychiatric:        Mood and Affect: Mood normal.    ED Results / Procedures / Treatments   Labs (all labs ordered are listed, but only abnormal results are displayed) Labs Reviewed  BASIC METABOLIC PANEL - Abnormal; Notable for the following components:      Result Value   Glucose, Bld  107 (*)    All other components within normal limits  CBC  BRAIN NATRIURETIC PEPTIDE  TROPONIN I (HIGH SENSITIVITY)  TROPONIN I (HIGH SENSITIVITY)    EKG EKG Interpretation  Date/Time:  Sunday March 01 2021 11:35:34 EST Ventricular Rate:  83 PR Interval:  182 QRS Duration: 86 QT Interval:  406 QTC Calculation: 477 R Axis:   -6 Text Interpretation: Normal sinus rhythm Cannot rule out Inferior infarct , age undetermined Anterolateral infarct (cited on or before 09-Jan-2019) Abnormal ECG When compared with ECG of 10-Sep-2019 11:47, Minimal criteria for Inferior infarct are now Present QT has lengthened Confirmed by Regan Lemming ([id number]) on 03/01/2021 11:52:50 AM    Radiology DG Chest 2 View  Result Date: 03/01/2021 CLINICAL DATA:  Chest pain EXAM: CHEST - 2 VIEW COMPARISON:  Chest x-ray 09/10/2019 FINDINGS: Heart size stable and within normal limits. Tortuous thoracic aorta. No suspicious pulmonary opacities identified. No pleural effusion or pneumothorax visualized. No acute osseous abnormality appreciated. IMPRESSION: No acute intrathoracic process identified. Electronically Signed   By: Ofilia Neas M.D.   On: 03/01/2021 12:38    Procedures Procedures   5:11 PM Cardiac monitoring reveals NSE, HR 60s (Rate & rhythm), as reviewed and interpreted by me. Cardiac monitoring was ordered due to chest pain and to monitor patient for dysrhythmia.   Medications Ordered in ED Medications  aspirin tablet 325 mg (325 mg Oral Given 03/01/21 1316)    ED Course/ Medical Decision Making/ A&P                           Medical Decision Making Amount and/or Complexity of Data Reviewed Labs: ordered. Radiology: ordered.  Risk OTC drugs.   56 year old male presents to the emergency department today for evaluation of intermittent chest pain over the last month.  Reviewed/interpreted labs CBC is without leukocytosis or anemia BMP is unremarkable BNP is negative Initial troponin  negative, delta troponin negative  Initial EKG - Normal sinus rhythm Cannot rule out Inferior infarct , age undetermined Anterolateral infarct (cited on or before 09-Jan-2019) Abnormal ECG When compared with ECG of 10-Sep-2019 11:47, Minimal criteria for Inferior infarct are now Present QT has lengthened   Repeat EKG -was reviewed with Dr. Billy Fischer.  There are no acute ischemic changes.  Reviewed/interpreted imaging CXR - No acute intrathoracic process identified.  Patient's cardiac work-up is reassuring.  Troponins are negative, EKG  is unchanged from prior.  Chest x-ray is clear.  His symptoms sound very atypical in nature and I have very low suspicion for ACS, unstable angina, or any other emergent cardiopulmonary process at this time that would require further work-up or admission.  I have advised that he follow-up with his cardiologist closely as an outpatient.  Have advised on close follow-up and strict return precautions.  He voices understanding the plan and reasons to return.  All questions answered.  Patient stable for discharge.    Final Clinical Impression(s) / ED Diagnoses Final diagnoses:  Atypical chest pain    Rx / DC Orders ED Discharge Orders     None         Bishop Dublin 03/01/21 1711    Regan Lemming, MD 03/01/21 2031

## 2021-03-01 NOTE — ED Triage Notes (Signed)
Pt presents to ED Pov. Pt c/o L chest pain and pain in L arm. Pt denies it radiating. Pt reports Sob, nausea associated w/ pain. Pt reports hx of MI 6y ago.

## 2021-03-01 NOTE — Discharge Instructions (Signed)
Your work-up today was reassuring and did not show any evidence that you are having a heart attack.  I do recommend that you follow-up with your cardiologist for further assessment of your symptoms.  Please monitor your symptoms closely.  If you have any new or worsening symptoms the meantime please return to the emergency department immediately.

## 2021-03-10 ENCOUNTER — Encounter: Payer: Self-pay | Admitting: Cardiovascular Disease

## 2021-03-10 ENCOUNTER — Other Ambulatory Visit: Payer: Self-pay

## 2021-03-10 ENCOUNTER — Ambulatory Visit: Payer: BC Managed Care – PPO | Admitting: Cardiovascular Disease

## 2021-03-10 DIAGNOSIS — I252 Old myocardial infarction: Secondary | ICD-10-CM

## 2021-03-10 DIAGNOSIS — R079 Chest pain, unspecified: Secondary | ICD-10-CM | POA: Diagnosis not present

## 2021-03-10 DIAGNOSIS — I255 Ischemic cardiomyopathy: Secondary | ICD-10-CM

## 2021-03-10 DIAGNOSIS — I2510 Atherosclerotic heart disease of native coronary artery without angina pectoris: Secondary | ICD-10-CM | POA: Diagnosis not present

## 2021-03-10 DIAGNOSIS — Z9861 Coronary angioplasty status: Secondary | ICD-10-CM

## 2021-03-10 DIAGNOSIS — E785 Hyperlipidemia, unspecified: Secondary | ICD-10-CM

## 2021-03-10 DIAGNOSIS — E782 Mixed hyperlipidemia: Secondary | ICD-10-CM

## 2021-03-10 NOTE — Patient Instructions (Addendum)
Medication Instructions:  The current medical regimen is effective;  continue present plan and medications as directed. Please refer to the Current Medication list given to you today.  *If you need a refill on your cardiac medications before your next appointment, please call your pharmacy*  Lab Work: NONE  Testing/Procedures: Echocardiogram - Your physician has requested that you have an echocardiogram. Echocardiography is a painless test that uses sound waves to create images of your heart. It provides your doctor with information about the size and shape of your heart and how well your hearts chambers and valves are working. This procedure takes approximately one hour. There are no restrictions for this procedure. This will be performed at either our [facility] location - [address], [place] location                                                                                                                                   [employer]                                Insert SmartText                      [place] 2nd floor.  Follow-Up: Your next appointment:  AFTER ECHO  In Person with [name], MD   At [facility], you and your health needs are our priority.  As part of our continuing mission to provide you with exceptional heart care, we have created designated Provider Care Teams.  These Care Teams include your primary Cardiologist (physician) and Advanced Practice Providers (APPs -  Physician Assistants and Nurse Practitioners) who all work together to provide you with the care you need, when you need it.

## 2021-03-10 NOTE — Progress Notes (Signed)
Patient ID: Cesar Woods, male   DOB: 19-Oct-1965, 56 y.o.   MRN: [id number]    Primary MD: [name]  HPI: Cesar Woods is a 56 y.o. male who presents to the office today for a 9 month follow-up cardiology evaluation.  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  In December, he developed substernal chest discomfort which he initially attributed to GERD.  This continued to recur over that day.  The following day he went to work and continued to experience vague discomfort.  He was seen by his primary physician, Dr. Scarlette Ar in his ECG was abnormal.  Changes of an anterolateral MI.  He presented to the emergency room.  He was not having any chest pain or shortness of breath or palpitations.  He underwent cardiac catheterization the following morning on 01/15/2015 which revealed significant CAD with 90% ostial LAD stenosis followed by 99% proximal LAD stenosis in the region of the first septal perforating artery with significant thrombus.  There was diffuse 50% stenosis followed by 95% ulcerated plaque in the proximal to mid LAD.  There also appeared to be an apical LAD dissection.  40% ramus intermediate, like high marginal stenosis and a large dominant RCA with 30% mid stenosis.  Large PDA and PLA vessels with septal collateralization to the proximal LAD.  He was felt to have an out of hospital late presentation anterolateral infarction secondary to his multiple high-grade stenoses.  He underwent a very difficult but successful intervention to his diffusely diseased LAD.  There was evidence for a spiral dissection in the mid distal LAD but ultimately was insertion of 3 tandem synergy DES stents (0.538, 3.038, and 3.020 mm from the mid LAD to the ostium and PTCA of the distal LAD with low-level inflation tach up his apical initial dissection.  There was total occlusion of the diagonal vessel  antegrade but there was significant development of retrograde collaterals to this diagonal vessel from the distal LAD.  Subsequent, he stabilized well.  He was seen in follow-up by Cesar Woods on 01/24/2015 and was doing well without chest pain or shortness of breath.  At that time his blood pressure was controlled.  He participated in the cardiac rehabilitation program.  He is back at work and has a stressful job in Engineer, mining for [employer].   When I saw him for follow-up evaluation in March 2017.  His ECG showed Q waves in V1 through V4 4 with persistent T-wave inversion anteriorly and anterolaterally.  I recommended that he discontinue metoprolol succinate and changed him to carvedilol.  He is now on carvedilol at 6.25 mg twice a day and is tolerating this well.  He continues to be on low-dose losartan 12.5 mg.  I scheduled him for follow-up echo Doppler study to reassess his LV function.  This was done on 05/30/2015.  Ejection fraction was now 40-45% and there was evidence for akinesis of the apical myocardium.  There was grade 1 diastolic dysfunction.  When I saw him in May 2017 for follow-up evaluation, he denied recurrent chest pain, PND or orthopnea.  At time, I slowly titrated ARB therapy and increase losartan to 25 mg at bedtime.  So increased his carvedilol to 9.375 mg twice a day for 2 weeks and further titrated this to 12.5 mg daily.   He developed a recurrent episode of chest pain in October 2017 and was hospitalized.  A nuclear perfusion study was performed which showed his previous large fixed defect involving the apex extending towards the mid ventricle in the anterior and lateral wall.  There is very minimal if any peri-infarct ischemia with an EF of 43%.  These were felt to be consistent with his prior MI.    Since I  saw him in March 2018 he had been evaluated in the emergency room with intermittent chest and back pain.  He had undergone a stress Myoview study in January 2019 which  showed an EF of 40% with a large defect consistent with his prior LAD infarction and there was no significant ischemia except mild peri-infarction ischemia.  In July 2019 he developed recurrent symptomatology and when I was on vacation and underwent repeat catheterization by Dr. Ellyn Hack.  He was found to have 85% narrowing in the midportion of his long LAD stent in the region of the diagonal takeoff.  The diagonal vessel was occluded but had excellent collaterals.  He was rehospitalized in October 2019 with recurrent chest pain.  At that time on November 09, 2017 I performed repeat cardiac catheterization.  This revealed a widely patent LAD stent with mild residual narrowing of 20% at the site of recent in-stent restenosis that was treated with Cutting Balloon in July 2019.  The diagonal branch which arose from this segment has both antegrade filling and extensive retrograde collateralization being filled from the apical LAD.  The left circumflex marginal vessel had 30 and 35% proximal to mid stenoses.  The RCA was a large dominant vessel with smooth 20% narrowing.  He had mild to moderate LV dysfunction and his ejection fraction was improved to 40 to 45% with apical kinesis and distal anterolateral hypocontractility.  His apical contour raised concern for possible apical thrombus.  An echo Doppler study was suspicious for a layer of mural thrombus apically which was different from July 2019.  As result he was started on apixaban.  I saw him for follow-up on November 28, 2017 at which time he continued to remain stable and has been without chest pain.  He is on Eliquis 5 mg, Plavix 75 mg, carvedilol 25 mg twice a day, atorvastatin 80 mg and has been on losartan 25 mg daily.  He denies chest tightness, baseline shortness of breath but had noted some mild shortness of breath with exertion.  During that evaluation, I discontinued losartan and initiated Entresto.  He took 24/25 mg twice daily for 2 weeks and was then  further titrated up to 49/51 mg twice a day.  He has continued to be on atorvastatin 80 mg with target LDL less than 70.  I  saw him in December 2019 at which time he was feeling improved with initiation of Entresto since initiating.  Over the past year, he has been titrated up to maximal dosing at 97/103 mg twice a day.  He has continued to take carvedilol 25 mg twice a day.  He has been on Eliquis 5 mg twice a day with Plavix and has been on Prilosec.  He was evaluated in August 2020 by Dr. Davina Woods prior to undergoing colonoscopy given instructions to hold his Plavix and Eliquis.  The past he was documented to have an apical mural thrombus and on his most recent echo in March 2020 EF was 40 to 45%.  There was septal and apical akinesis secondary to his prior MI.  There was no apical thrombus noted which had resolved from his previous go.  During  that evaluation blood pressure was low.  When I saw him on January 03, 2019  he had noticed some episodes of mild dizziness.  He denied any palpitations or awareness of arrhythmia.  He had experienced some depression secondary to the COVID-19 pandemic.    During that evaluation his blood pressure was low and on exam there was a 10 mm orthostatic drop from supine to standing.  Consequently I reduced his Entresto back to 49/51 mg twice a day.  Since it was well over a year from his ACS and stenting I recommended discontinuance of Plavix and resuming aspirin 81 mg to take along with Eliquis 5 mg twice a day.  He underwent a follow-up echo Doppler study on April 30, 2019 which showed an EF of 35 to 40%.  There was grade 1 diastolic dysfunction.  There was severe akinesis of the apex and distal anteroseptal new from lateral wall.  Definity contrast was used and there was no apical thrombus.  I saw him in April 2021. At that time, he felt well and had both Covid vaccinations.  He was be planning to undergo a colonoscopy in May.  He denied chest pain PND orthopnea.   He denied any orthostatic symptoms.  During that evaluation, he apparently was now on a further reduced dose of Entresto 24/26. His blood pressure was stable. I reviewed his echo Doppler study which showed an EF of 35 to 40% without thrombus. As result I discontinued Eliquis and recommend mended resumption of Plavix plus aspirin. I did recommend he undergo a [id number] test to make certain his Plavix responsive.  I saw him in November 2021.  He had developed some episodes of palpitations in early August 2021. He went to the emergency room. Laboratory was drawn. Troponin was negative. BNP was excellent at 24.4. There was a 13-hour wait and ultimately he ended up being told to leave since his labs were normal. He did subsequently wear a monitor from August 22 through October 29, 2019 which showed predominant sinus rhythm at 78 bpm. Slowest heart rate was 60 bpm which was sinus rhythm and his fastest heart rate was sinus tachycardia at 126 bpm which occurred at 12:55 AM. Subsequently, he has felt well. He denied chest pain PND orthopnea. He realized today that he had never had a P2 Y12 test done and he plans to do this.   Since I last saw him, he had developed episodes of some palpitations and went to the ED in [place] on May 08, 2020.  Troponins were negative.  Heart rate was increased at 110.  Had another episode where he noticed his heart rate increasing at a concert in [place].  He was seen on May 30, 2020 by Cesar Sims, Cesar Woods.  During her how evaluation he was stable.  He had undergone an echo Doppler study on April 29, 2020 which showed an EF at 35 to 38% grade 1 diastolic dysfunction.  Laboratory in April 2022 showed total cholesterol 100 triglycerides 183 HDL 30 and LDL 40.  Sh was slightly over suppressed at 0.34.  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  I  last saw him on October 06, 2020.  He was planning to go to [place] in [place] this weekend with his wife.  Last week he noted some twinges of nonexertional which occurred when he was sitting down and also an occasional skipped beat.  He apparently went to [facility] ER and was stable.  In that evaluation, his ECG was unremarkable and showed his old anterior wall MI with QS complex V1 through V5.  His primary care doctor had retired and he was in Secretary/administrator for a new primary physician.  Since I last saw him, he has continued to feel well.  However he was recently evaluated at [facility] ER with atypical chest pain.  He had experienced some sharp twinges of chest pain which were nonexertional.  Troponins were normal.  ECG was stable.  He was not felt to have an ischemic etiology.  Nuys any dizziness on his reduced dose of Entresto and he continues to be on 24/26 mg twice daily in addition to carvedilol 25 mg twice a day.  His BNP level on March 01, 2021 was excellent at 12.1.  Creatinine was stable at 0.78.  CBC was stable.  He presents for reevaluation.  Past Medical History:  Diagnosis Date   ACS (acute coronary syndrome) ([place]) 11/2015   Atypical chest pain    Negative Myoview 2010   Coronary artery disease    a.s/p STEMI in 01/2015 requiring placement of 4 DES to LAD. b. 11/2015: NST showing apical scar with minimal peri-infarct ischemia.   Essential hypertension    Food allergy    Anaphylaxis with shell fish   GERD (gastroesophageal reflux disease)    History of migraines    Hyperlipidemia    Ischemic cardiomyopathy    a. Echo 05/2015: EF 40-45%, apical akineiss with Grade 1 DD.   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 Risa Grill; had bilateral orchiectomy and XRT in past    Past Surgical History:  Procedure Laterality Date   CARDIAC CATHETERIZATION N/A 01/15/2015   Procedure: Left Heart Cath and Coronary Angiography;  Surgeon: Troy Sine,  MD;  Location: [place] [facility];  Service: Cardiovascular;  Laterality: N/A;   CARDIAC CATHETERIZATION  01/15/2015   Procedure: Coronary Stent Intervention;  Surgeon: Troy Sine, MD;  Location: [place] [facility];  Service: Cardiovascular;;   CORONARY BALLOON ANGIOPLASTY N/A 08/17/2017   Procedure: CORONARY BALLOON ANGIOPLASTY;  Surgeon: Leonie Man, MD;  Location: [place] [facility];  Service: Cardiovascular;  Laterality: N/A;   LEFT HEART CATH AND CORONARY ANGIOGRAPHY N/A 08/17/2017   Procedure: LEFT HEART CATH AND CORONARY ANGIOGRAPHY;  Surgeon: Leonie Man, MD;  Location: [place] [facility];  Service: Cardiovascular;  Laterality: N/A;   LEFT HEART CATH AND CORONARY ANGIOGRAPHY N/A 11/09/2017   Procedure: LEFT HEART CATH AND CORONARY ANGIOGRAPHY;  Surgeon: Troy Sine, MD;  Location: [place] [facility];  Service: Cardiovascular;  Laterality: N/A;   ORCHIECTOMY Bilateral     Allergies  Allergen Reactions   Iodine Anaphylaxis    Shellfish allergy    Shellfish Allergy Anaphylaxis   Penicillins Other (See Comments)    Childhood allergy Has patient had a PCN reaction causing immediate rash, facial/tongue/throat swelling, SOB or lightheadedness with hypotension: Unknown Has patient had a PCN reaction causing severe rash involving mucus membranes or skin necrosis: Unknown Has patient had a PCN reaction that required hospitalization: No Has patient had a PCN reaction occurring within the last 10 years: No If all of the above answers are "NO", then may proceed with Cephalosporin use.     Ace Inhibitors Cough    Current Outpatient Medications  Medication Sig Dispense Refill   aspirin EC 81  MG tablet Take 1 tablet (81 mg total) by mouth daily. (Patient taking differently: Take 81 mg by mouth at bedtime.) 90 tablet 3   atorvastatin (LIPITOR) 80 MG tablet Take 1 tablet (80 mg total) by mouth daily. 90 tablet 0   carvedilol (COREG) 25 MG tablet TAKE 1 TABLET(25 MG) BY MOUTH  TWICE DAILY WITH A MEAL 60 tablet 8   clopidogrel (PLAVIX) 75 MG tablet TAKE 1 TABLET(75 MG) BY MOUTH DAILY 90 tablet 3   EPIPEN 2-PAK 0.3 MG/0.3ML SOAJ injection INJECT 0.3 MLS INTO THE MUSCLE ONCE **PATIENT NEEDS TO SCHEDULE AN OFFICE VISIT** (Patient taking differently: Inject 0.3 mg into the muscle as needed (for an allergic reaction).) 2 Device 0   nitroGLYCERIN (NITROSTAT) 0.4 MG SL tablet PLACE 1 TABLET BY MOUTH UNDER THE TONGUE EVERY 5 MINUTES FOR 3 DOSES AS NEEDED FOR CHEST PAIN 25 tablet 2   omeprazole (PRILOSEC) 20 MG capsule Take 20 mg by mouth at bedtime.      sacubitril-valsartan (ENTRESTO) 24-26 MG Take 1 tablet by mouth 2 (two) times daily. 180 tablet 2   Testosterone 1.62 % GEL Apply 4 Pump topically daily.      No current facility-administered medications for this visit.    Social History   Socioeconomic History   Marital status: Married    Spouse name: Not on file   Number of children: Not on file   Years of education: Not on file   Highest education level: Not on file  Occupational History   Not on file  Tobacco Use   Smoking status: Former   Smokeless tobacco: Never   Tobacco comments:    quit around 2010  Vaping Use   Vaping Use: Never used  Substance and Sexual Activity   Alcohol use: Yes    Alcohol/week: 0.0 standard drinks    Comment: occasionally   Drug use: No    Comment: remote use of marijuana, quit a long time ago   Sexual activity: Not on file  Other Topics Concern   Not on file  Social History Narrative   Occupation:Senior Trader for [employer]   Divorced    No children      Former smoker    Alcohol Use - yes         Social Determinants of Health   Financial Resource Strain: Not on file  Food Insecurity: Not on file  Transportation Needs: Not on file  Physical Activity: Not on file  Stress: Not on file  Social Connections: Not on file  Intimate Partner Violence: Not on file   Additional social history is notable that he was born in  [place], [place].  He lived for over a decade in [place].  He is married.  Former smoker.  Family History  Problem Relation Age of Onset   Heart attack Father        CABG at agae 56   Stroke Paternal Grandfather    Hypertension Other    Hyperlipidemia Other    Heart attack Maternal Grandmother     ROS General: Negative; No fevers, chills, or night sweats HEENT: Negative; No changes in vision or hearing, sinus congestion, difficulty swallowing Pulmonary: Negative; No cough, wheezing, shortness of breath, hemoptysis Cardiovascular: See HPI:  GI: Negative; No nausea, vomiting, diarrhea, or abdominal pain GU: History of testicular cancer, status post orchiectomy. Musculoskeletal: Negative; no myalgias, joint pain, or weakness Hematologic: Negative; no easy bruising, bleeding Endocrine: Negative; no heat/cold intolerance; no diabetes, Neuro: Negative; no changes in  balance, headaches Skin: Negative; No rashes or skin lesions Psychiatric: Negative; No behavioral problems, depression Sleep: Negative; No snoring,  daytime sleepiness, hypersomnolence, bruxism, restless legs, hypnogognic hallucinations. Other comprehensive 14 point system review is negative   Physical Exam BP 122/80    Pulse 71    Ht 6' (1.829 m)    Wt 216 lb 12.8 oz (98.3 kg)    SpO2 100%    BMI 29.40 kg/m    Repeat blood pressure by me was 122/80.  Wt Readings from Last 3 Encounters:  03/10/21 216 lb 12.8 oz (98.3 kg)  03/01/21 210 lb (95.3 kg)  10/06/20 211 lb (95.7 kg)   General: Alert, oriented, no distress.  Skin: normal turgor, no rashes, warm and dry HEENT: Normocephalic, atraumatic. Pupils equal round and reactive to light; sclera anicteric; extraocular muscles intact;  Nose without nasal septal hypertrophy Mouth/Parynx benign; Mallinpatti scale 3 Neck: No JVD, no carotid bruits; normal carotid upstroke Lungs: clear to ausculatation and percussion; no wheezing or rales Chest wall: without  tenderness to palpitation Heart: PMI not displaced, RRR, s1 s2 normal, 1/6 systolic murmur, no diastolic murmur, no rubs, gallops, thrills, or heaves Abdomen: soft, nontender; no hepatosplenomehaly, BS+; abdominal aorta nontender and not dilated by palpation. Back: no CVA tenderness Pulses 2+ Musculoskeletal: full range of motion, normal strength, no joint deformities Extremities: no clubbing cyanosis or edema, Homan's sign negative  Neurologic: grossly nonfocal; Cranial nerves grossly wnl Psychologic: Normal mood and affect  March 10, 2021 ECG (independently read by me): Sinus rhythm at 71 bpm.  QS complex V1 through V5.  Normal intervals.  No ectopy.  October 06, 2020 ECG (independently read by me):  NSR at 78; possible LAE, QS V1-5 c/w prior anterior MI  November 2021 ECG (independently read by me): Normal sinus rhythm at 78 bpm.  QS complex V1 through V4 unchanged.  No ectopy.  April 2021 ECG (independently read by me): NSR at 72; QS V1-4 c/w prior anterior MI: no ectopy; normal intervals  November 2020 ECG (independently read by me): Normal sinus rhythm at 78 bpm.  QS complex V1 through V4 consistent with prior anterior MI.  No ectopy.  QTc interval 389 ms.  Parable '1 7 8 ' ms  December 2019 ECG (independently read by me): Normal sinus rhythm at 75 bpm.  QS complex V1 through V4 consistent with old anterior MI.  Normal intervals.  No ectopy.  November 28, 2017 ECG (independently read by me): Normal sinus rhythm at 83 bpm.  QS complex V1 through V5 consistent with old anterior MI.  Normal intervals.  No ectopy.  March 2019 ECG (independently read by me): Normal sinus rhythm at 79 bpm.  Old anterolateral infarct changes.  QRS complex V1 through V5; normal intervals.  No significant ST changes.  January 2018 ECG (independently read by me): Normal sinus rhythm at 92 bpm.  Anterior Q waves consistent with his prior anterolateral MI.  September 2017 ECG (independently read by me): Sinus  rhythm at 83 bpm.  QRS complex V1-V6  concordant with his large anterolateral MI.  May 2017 ECG (independently read by me): Normal sinus rhythm at 79 bpm.  T-wave inversion V2 through V6 and 1 and aVL concordant with his prior anterior to anterolateral MI.  March 2017 ECG (independently read by me): NSR at 80; QS V1-4 with T wave inversion V1-V6, I and aVL  LABS:  BMP Latest Ref Rng & Units 03/01/2021 09/10/2019 08/27/2019  Glucose 70 - 99 mg/dL 107(H) 122(H)  139(H)  BUN 6 - 20 mg/dL '18 13 14  ' Creatinine 0.61 - 1.24 mg/dL 0.78 0.91 0.87  BUN/Creat Ratio 9 - 20 - - -  Sodium 135 - 145 mmol/L 140 138 142  Potassium 3.5 - 5.1 mmol/L 3.9 3.9 3.8  Chloride 98 - 111 mmol/L 104 103 107  CO2 22 - 32 mmol/L '28 26 27  ' Calcium 8.9 - 10.3 mg/dL 9.1 8.8(L) 8.9     Hepatic Function Latest Ref Rng & Units 05/30/2020 05/29/2019 02/07/2018  Total Protein 6.0 - 8.5 g/dL 7.1 6.5 6.3  Albumin 3.8 - 4.9 g/dL 4.5 4.3 4.3  AST 0 - 40 IU/L '12 13 15  ' ALT 0 - 44 IU/L '16 14 21  ' Alk Phosphatase 44 - 121 IU/L 114 102 97  Total Bilirubin 0.0 - 1.2 mg/dL 1.9(H) 1.7(H) 1.3(H)  Bilirubin, Direct 0.00 - 0.40 mg/dL 0.37 - -    CBC Latest Ref Rng & Units 03/01/2021 09/10/2019 08/27/2019  WBC 4.0 - 10.5 K/uL 9.8 8.4 8.5  Hemoglobin 13.0 - 17.0 g/dL 13.8 14.7 14.2  Hematocrit 39.0 - 52.0 % 41.1 44.0 43.5  Platelets 150 - 400 K/uL 163 193 194   Lab Results  Component Value Date   MCV 88.2 03/01/2021   MCV 89.1 09/10/2019   MCV 89.9 08/27/2019    Lab Results  Component Value Date   TSH 0.503 10/06/2020    BNP    Component Value Date/Time   BNP 12.1 03/01/2021 1149   BNP <2.0 06/26/2010 1334    ProBNP    Component Value Date/Time   PROBNP 46 02/07/2018 1216   PROBNP <2.0 pg/mL 10/17/2009 1834     Lipid Panel     Component Value Date/Time   CHOL 84 (L) 10/06/2020 1032   TRIG 105 10/06/2020 1032   HDL 28 (L) 10/06/2020 1032   CHOLHDL 3.0 10/06/2020 1032   CHOLHDL 3.5 11/09/2017 0356   VLDL 27  11/09/2017 0356   LDLCALC 36 10/06/2020 1032   LDLDIRECT 99.9 09/15/2012 1738     RADIOLOGY: DG Chest 2 View  Result Date: 03/01/2021 CLINICAL DATA:  Chest pain EXAM: CHEST - 2 VIEW COMPARISON:  Chest x-ray 09/10/2019 FINDINGS: Heart size stable and within normal limits. Tortuous thoracic aorta. No suspicious pulmonary opacities identified. No pleural effusion or pneumothorax visualized. No acute osseous abnormality appreciated. IMPRESSION: No acute intrathoracic process identified. Electronically Signed   By: Ofilia Neas M.D.   On: 03/01/2021 12:38    IMPRESSION:  1. History of ST elevation myocardial infarction (STEMI):01/15/2015   2. CAD S/P percutaneous coronary angioplasty   3. Ischemic cardiomyopathy   4. Chest pain, unspecified type   5. Hyperlipidemia LDL goal <70   6. Mixed hyperlipidemia   7. Gilbert's syndrome     ASSESSMENT AND PLAN: Cesar. Stiles Maxcy is a 68 -year-old white male who presented to [facility] on the evening of 01/14/2015 with late presentation  anterolateral MI which most likely occurred the day prior to his presentation.  In the emergency room, he was pain-free. Cardiac catheterization revealed severe diffuse CAD with ulcerated plaque and thrombus and evidence for spontaneous distal LAD dissection.  He had a very difficult but successful PCI to his LAD, and ultimate insertion of 3 Synergy DES stents covering a total of 96 mm and extending from the ostium to the mid LAD.  He also had PTCA of his distal LAD dissection.  A echo Doppler showed an EF of 40-45% and apical akinesis.  He  was without  anginal symptomatology until July 2019 when he was found to have very focal 85% in-stent restenosis in the LAD stent at the region of the diagonal vessel with occlusion of the diagonal.  He had excellent collateralization to this diagonal vessel.  He underwent successful cutting balloon.  His last catheterization was on November 09, 2017 which demonstrated patent LAD  stents  and there was now both antegrade and extensive retrograde collateralization to the diagonal vessel.  Subsequently he was without recurrent angina.  He was transitioned from losartan to [facility].  He was treated with Eliquis due to apical thrombus which on his most  echo Doppler study in March 2021 had resolved.  EF was 35 to 40% with grade 1 diastolic dysfunction and there was severe akinesis of the apex and distal anteroseptal lateral wall.  He had developed increasing palpitations leading to a visit to the ER.  Laboratory was negative.  A subsequent Holter monitor did not reveal any significant ectopy with predominant sinus rhythm with an average rate at 78, slowest heart rate at sinus bradycardia at 60 bpm and fastest heart rate being sinus tachycardia at 126 bpm.  He had been titrated on Entresto to 49/51 mg twice daily but due to low blood pressure this was reduced back to 24/26 mg twice a day.  He has had issues with atypical sharp chest pain and has had several ER evaluations.  I reviewed his most recent presentation to [facility] ER on March 01, 2021.  His pain was atypical and nonischemic.  Troponins were normal at 4, 4 and 7.  BNP was excellent at 12.1.  He is euvolemic and is without CHF symptoms.  For this reason he was never started on spironolactone.  His blood pressure today is stable.  His ECG is unchanged.  He continues to be on atorvastatin 80 mg for hyperlipidemia.  Lipid studies in August 2022 showed total cholesterol 84, LDL cholesterol 36, and triglycerides 105.  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  He will be traveling to [place] in the near future.  In 6 months, I have recommended he undergo a 2-year follow-up echo Doppler study and I will see him for follow-up evaluation.   Troy Sine, MD, [facility]  03/10/2021 6:00 PM

## 2021-03-17 ENCOUNTER — Encounter (HOSPITAL_COMMUNITY): Payer: Self-pay | Admitting: Cardiovascular Disease

## 2021-03-24 ENCOUNTER — Telehealth (HOSPITAL_COMMUNITY): Payer: Self-pay | Admitting: Cardiovascular Disease

## 2021-03-24 NOTE — Telephone Encounter (Signed)
Just an FYI. We have made several attempts to contact this patient including sending a letter to schedule or reschedule their echocardiogram. We will be removing the patient from the echo WQ.   MAILED LETTER LBW  03/17/21 [facility] x 3 to schedule @ 11:47/LBW  03/12/21 [facility] to schedule @ 3:01/LBW pt picked up call and hung up/LBW  03/11/21 [facility] to schedule @ 9:06am/LBW     Thank you

## 2021-04-01 ENCOUNTER — Ambulatory Visit (HOSPITAL_BASED_OUTPATIENT_CLINIC_OR_DEPARTMENT_OTHER): Payer: BC Managed Care – PPO | Admitting: Family Medicine

## 2021-04-16 ENCOUNTER — Other Ambulatory Visit: Payer: Self-pay | Admitting: Cardiovascular Disease

## 2021-04-20 ENCOUNTER — Ambulatory Visit: Payer: BC Managed Care – PPO | Admitting: Cardiovascular Disease

## 2021-04-27 ENCOUNTER — Other Ambulatory Visit: Payer: Self-pay

## 2021-04-27 ENCOUNTER — Ambulatory Visit (HOSPITAL_BASED_OUTPATIENT_CLINIC_OR_DEPARTMENT_OTHER): Payer: BC Managed Care – PPO | Admitting: Family Medicine

## 2021-04-27 ENCOUNTER — Encounter (HOSPITAL_BASED_OUTPATIENT_CLINIC_OR_DEPARTMENT_OTHER): Payer: Self-pay | Admitting: Family Medicine

## 2021-04-27 VITALS — BP 117/62 | HR 87 | Ht 72.0 in | Wt 216.8 lb

## 2021-04-27 DIAGNOSIS — Z8547 Personal history of malignant neoplasm of testis: Secondary | ICD-10-CM | POA: Diagnosis not present

## 2021-04-27 DIAGNOSIS — Z0000 Encounter for general adult medical examination without abnormal findings: Secondary | ICD-10-CM

## 2021-04-27 DIAGNOSIS — I10 Essential (primary) hypertension: Secondary | ICD-10-CM | POA: Diagnosis not present

## 2021-04-27 DIAGNOSIS — L600 Ingrowing nail: Secondary | ICD-10-CM | POA: Diagnosis not present

## 2021-04-27 DIAGNOSIS — I252 Old myocardial infarction: Secondary | ICD-10-CM

## 2021-04-27 NOTE — Progress Notes (Signed)
? ?New Patient Office Visit ? ?Subjective:  ?Patient ID: Cesar Woods, male    DOB: May 22, 1965  Age: 56 y.o. MRN: [id number] ? ?CC:  ?Chief Complaint  ?Patient presents with  ? New Patient (Initial Visit)  ?  Patient presents today to establish care, PCP Dr Claiborne Billings retied during covid. He would like to discuss his left foot great toe, he stated possible infection.No medications needed they are all prescribed by his cardiologist Dr Claiborne Billings.  ? ? ?HPI ?Cesar Woods is a 56 year old male presenting to establish in clinic.  He has current concerns as outlined above.  Past medical history significant for hypertension, CAD with history of STEMI, hyperlipidemia, GERD, ischemic cardiomyopathy, testicular cancer status post bilateral orchiectomy. ? ?Only concern today is related to left great toe redness and swelling.  He has noticed this over the past couple days, mostly unchanged since first noticed.  Reports having occasional issues in the past similar to this which would typically respond to conservative measures.  Area of concern is located along medial nail margin. ? ?Patient has a history of testicular cancer with history of bilateral orchiectomy.  Patient does follow with urology.  Currently also receives treatment with topical testosterone gel. ?Patient additionally has history of STEMI in 2016, ischemic cardiomyopathy, follows with cardiology.  Current medications in this regard include atorvastatin, ASA 81, carvedilol, Plavix, Entresto. ? ?Patient is originally from [place], moved [place] the [place] in 1999.  Patient was living in [place], now lives in [place], [place].  He works for [employer] in Engineer, mining.  Outside of work he enjoys traveling, will travel for about 1 month out of the year.  Otherwise he enjoys being home with his family, they also have 2 dogs at home. ? ?Past Medical History:  ?Diagnosis Date  ? ACS (acute coronary syndrome) ([place]) 11/2015  ? Atypical chest pain   ? Negative Myoview 2010  ? Coronary artery  disease   ? a.s/p STEMI in 01/2015 requiring placement of 4 DES to LAD. b. 11/2015: NST showing apical scar with minimal peri-infarct ischemia.  ? Essential hypertension   ? Food allergy   ? Anaphylaxis with shell fish  ? GERD (gastroesophageal reflux disease)   ? History of migraines   ? Hyperlipidemia   ? Ischemic cardiomyopathy   ? a. Echo 05/2015: EF 40-45%, apical akineiss with Grade 1 DD.  ? Myocardial infarction [facility]) 2016  ? Testicle cancer ([place])   ? In remission - followed by Dr. Risa Grill; had bilateral orchiectomy and XRT in past  ? ? ?Past Surgical History:  ?Procedure Laterality Date  ? CARDIAC CATHETERIZATION N/A 01/15/2015  ? Procedure: Left Heart Cath and Coronary Angiography;  Surgeon: Troy Sine, MD;  Location: [place] [facility];  Service: Cardiovascular;  Laterality: N/A;  ? CARDIAC CATHETERIZATION  01/15/2015  ? Procedure: Coronary Stent Intervention;  Surgeon: Troy Sine, MD;  Location: [place] [facility];  Service: Cardiovascular;;  ? CORONARY BALLOON ANGIOPLASTY N/A 08/17/2017  ? Procedure: CORONARY BALLOON ANGIOPLASTY;  Surgeon: Leonie Man, MD;  Location: [place] [facility];  Service: Cardiovascular;  Laterality: N/A;  ? LEFT HEART CATH AND CORONARY ANGIOGRAPHY N/A 08/17/2017  ? Procedure: LEFT HEART CATH AND CORONARY ANGIOGRAPHY;  Surgeon: Leonie Man, MD;  Location: [place] [facility];  Service: Cardiovascular;  Laterality: N/A;  ? LEFT HEART CATH AND CORONARY ANGIOGRAPHY N/A 11/09/2017  ? Procedure: LEFT HEART CATH AND CORONARY ANGIOGRAPHY;  Surgeon: Troy Sine, MD;  Location: [facility]  INVASIVE CV LAB;  Service: Cardiovascular;  Laterality: N/A;  ? ORCHIECTOMY Bilateral   ? ? ?Family History  ?Problem Relation Age of Onset  ? Heart attack Father   ?     CABG at agae 45  ? Stroke Paternal Grandfather   ? Hypertension Other   ? Hyperlipidemia Other   ? Heart attack Maternal Grandmother   ? ? ?Social History  ? ?Socioeconomic History  ? Marital status: Married  ?  Spouse name: Not  on file  ? Number of children: Not on file  ? Years of education: Not on file  ? Highest education level: Not on file  ?Occupational History  ? Not on file  ?Tobacco Use  ? Smoking status: Former  ? Smokeless tobacco: Never  ? Tobacco comments:  ?  quit around 2010  ?Vaping Use  ? Vaping Use: Never used  ?Substance and Sexual Activity  ? Alcohol use: Yes  ?  Alcohol/week: 0.0 standard drinks  ?  Comment: occasionally  ? Drug use: No  ?  Comment: remote use of marijuana, quit a long time ago  ? Sexual activity: Not on file  ?Other Topics Concern  ? Not on file  ?Social History Narrative  ? Occupation:Senior Trader for [employer]  ? Divorced   ? No children     ? Former smoker   ? Alcohol Use - yes    ?    ? ?Social Determinants of Health  ? ?Financial Resource Strain: Not on file  ?Food Insecurity: Not on file  ?Transportation Needs: Not on file  ?Physical Activity: Not on file  ?Stress: Not on file  ?Social Connections: Not on file  ?Intimate Partner Violence: Not on file  ? ? ?Objective:  ? ?Today's Vitals: BP 117/62   Pulse 87   Ht 6' (1.829 m)   Wt 216 lb 12.8 oz (98.3 kg)   SpO2 96%   BMI 29.40 kg/m?  ? ?Physical Exam ? ?56 year old male in no acute distress ?Cardiovascular exam with regular rate and rhythm ?Lungs clear to auscultation bilaterally ?Left great toe with mild redness along medial nail fold, no drainage noted, mild tenderness on palpation.  Normal range of motion at great toe. ? ?Assessment & Plan:  ? ?Problem List Items Addressed This Visit   ? ?  ? Cardiovascular and Mediastinum  ? Essential hypertension - Primary  ?  Blood pressure at goal in office today, can continue with current regimen ?Patient to continue regular follow-up with cardiology ?Recommend intermittent monitoring of blood pressure at home, DASH diet ?  ?  ? Relevant Orders  ? CBC with Differential/Platelet  ? Comprehensive metabolic panel  ? Hemoglobin A1c  ? Lipid panel  ? TSH Rfx on Abnormal to Free T4  ?  ? Musculoskeletal and  Integument  ? Ingrown toenail of left foot  ?  Area on left toe appears consistent with mild ingrown toenail.  Discussed conservative treatment considerations given current mild nature of ingrown nail.  Did discuss symptoms to monitor and if these are progressing, we can consider referral to podiatry for further evaluation and treatment, particularly if area is becoming more swollen or painful or if redness is spreading ?  ?  ?  ? Other  ? History of testicular cancer  ?  Follows with urology, currently on testosterone gel, appears to be tolerating well ?  ?  ? History of ST elevation myocardial infarction (STEMI)  ?  Continue with current medication regimen, tolerating  statin well ?No signs of bleeding at present, continue with ASA 81, Plavix as per cardiology ?  ?  ? ? ?Outpatient Encounter Medications as of 04/27/2021  ?Medication Sig  ? aspirin EC 81 MG tablet Take 1 tablet (81 mg total) by mouth daily. (Patient taking differently: Take 81 mg by mouth at bedtime.)  ? atorvastatin (LIPITOR) 80 MG tablet TAKE 1 TABLET(80 MG) BY MOUTH DAILY  ? carvedilol (COREG) 25 MG tablet TAKE 1 TABLET(25 MG) BY MOUTH TWICE DAILY WITH A MEAL  ? clopidogrel (PLAVIX) 75 MG tablet TAKE 1 TABLET(75 MG) BY MOUTH DAILY  ? EPIPEN 2-PAK 0.3 MG/0.3ML SOAJ injection INJECT 0.3 MLS INTO THE MUSCLE ONCE **PATIENT NEEDS TO SCHEDULE AN OFFICE VISIT** (Patient taking differently: Inject 0.3 mg into the muscle as needed (for an allergic reaction).)  ? nitroGLYCERIN (NITROSTAT) 0.4 MG SL tablet PLACE 1 TABLET BY MOUTH UNDER THE TONGUE EVERY 5 MINUTES FOR 3 DOSES AS NEEDED FOR CHEST PAIN  ? omeprazole (PRILOSEC) 20 MG capsule Take 20 mg by mouth at bedtime.   ? sacubitril-valsartan (ENTRESTO) 24-26 MG Take 1 tablet by mouth 2 (two) times daily.  ? Testosterone 1.62 % GEL Apply 4 Pump topically daily.   ? ?No facility-administered encounter medications on file as of 04/27/2021.  ? ? ?Follow-up: Return in about 3 months (around 07/28/2021).  Plan for  follow-up in about 3 months for CPE, nurse visit for labs 1 week prior ? ?Adonys Wildes J [place], MD ? ?

## 2021-04-27 NOTE — Patient Instructions (Signed)
?  Medication Instructions:  ?Your physician recommends that you continue on your current medications as directed. Please refer to the Current Medication list given to you today. ?--If you need a refill on any your medications before your next appointment, please call your pharmacy first.  ? ? ?Follow-Up: ?Your next appointment:   ?Your physician recommends that you schedule a follow-up appointment in: 3 Months CPE  nurse visit one week prior with Dr. Tennis Must [place] ? ?You will receive a text message or e-mail with a link to a survey about your care and experience with [place] today! We would greatly appreciate your feedback!  ? ?Thanks for letting us be apart of your health journey!!  ?Primary Care and Sports Medicine  ? ?Dr. Kyung Rudd de [place]  ? ?We encourage you to activate your patient portal called "MyChart".  Sign up information is provided on this After Visit Summary.  MyChart is used to connect with patients for Virtual Visits (Telemedicine).  Patients are able to view lab/test results, encounter notes, upcoming appointments, etc.  Non-urgent messages can be sent to your provider as well. To learn more about what you can do with MyChart, please visit --  [email or web address].    ?

## 2021-04-28 DIAGNOSIS — L600 Ingrowing nail: Secondary | ICD-10-CM | POA: Insufficient documentation

## 2021-04-28 NOTE — Assessment & Plan Note (Signed)
Blood pressure at goal in office today, can continue with current regimen ?Patient to continue regular follow-up with cardiology ?Recommend intermittent monitoring of blood pressure at home, DASH diet ?

## 2021-04-28 NOTE — Assessment & Plan Note (Signed)
Area on left toe appears consistent with mild ingrown toenail.  Discussed conservative treatment considerations given current mild nature of ingrown nail.  Did discuss symptoms to monitor and if these are progressing, we can consider referral to podiatry for further evaluation and treatment, particularly if area is becoming more swollen or painful or if redness is spreading ?

## 2021-04-28 NOTE — Assessment & Plan Note (Signed)
Follows with urology, currently on testosterone gel, appears to be tolerating well ?

## 2021-04-28 NOTE — Assessment & Plan Note (Signed)
Continue with current medication regimen, tolerating statin well ?No signs of bleeding at present, continue with ASA 81, Plavix as per cardiology ?

## 2021-05-08 ENCOUNTER — Other Ambulatory Visit: Payer: Self-pay | Admitting: Cardiovascular Disease

## 2021-05-08 ENCOUNTER — Other Ambulatory Visit: Payer: Self-pay | Admitting: Physician Assistant

## 2021-05-10 ENCOUNTER — Other Ambulatory Visit: Payer: Self-pay | Admitting: Cardiovascular Disease

## 2021-06-02 ENCOUNTER — Ambulatory Visit (HOSPITAL_BASED_OUTPATIENT_CLINIC_OR_DEPARTMENT_OTHER): Payer: BC Managed Care – PPO | Admitting: Family Medicine

## 2021-06-02 ENCOUNTER — Encounter (HOSPITAL_BASED_OUTPATIENT_CLINIC_OR_DEPARTMENT_OTHER): Payer: Self-pay | Admitting: Family Medicine

## 2021-06-02 VITALS — BP 108/79 | HR 88 | Temp 97.9°F | Ht 72.0 in | Wt 217.8 lb

## 2021-06-02 DIAGNOSIS — E042 Nontoxic multinodular goiter: Secondary | ICD-10-CM | POA: Diagnosis not present

## 2021-06-02 NOTE — Assessment & Plan Note (Signed)
Beginning Friday, patient noticed some increased swelling along right anterior neck.  Patient does have history of multinodular goiter with thyroid nodules observed previously on ultrasound.  He has also had fine-needle aspiration completed which at the time showed benign findings, this was most recently completed in 2016.  He denies any new issues with heart palpitations, changes in bowel movements, fevers, sweats.  He has not had any hoarseness or trouble swallowing. ?On exam, some slight increased prominence diffusely over right aspect of thyroid, no tenderness to palpation.  Blood pressure normal, pulse slightly elevated, however not tachycardic.  Patient overall in no acute distress ?We will proceed with laboratory evaluation with thyroid function studies, thyroid ultrasound.  We will proceed with referral to endocrinology as well ?

## 2021-06-02 NOTE — Progress Notes (Signed)
? ? ?  Procedures performed today:   ? ?None. ? ?Independent interpretation of notes and tests performed by another provider:  ? ?None. ? ?Brief History, Exam, Impression, and Recommendations:   ? ?BP 108/79   Pulse 88   Temp 97.9 ?F (36.6 ?C) (Oral)   Ht 6' (1.829 m)   Wt 217 lb 12.8 oz (98.8 kg)   SpO2 96%   BMI 29.54 kg/m?  ? ?GOITER, MULTINODULAR ?Beginning Friday, patient noticed some increased swelling along right anterior neck.  Patient does have history of multinodular goiter with thyroid nodules observed previously on ultrasound.  He has also had fine-needle aspiration completed which at the time showed benign findings, this was most recently completed in 2016.  He denies any new issues with heart palpitations, changes in bowel movements, fevers, sweats.  He has not had any hoarseness or trouble swallowing. ?On exam, some slight increased prominence diffusely over right aspect of thyroid, no tenderness to palpation.  Blood pressure normal, pulse slightly elevated, however not tachycardic.  Patient overall in no acute distress ?We will proceed with laboratory evaluation with thyroid function studies, thyroid ultrasound.  We will proceed with referral to endocrinology as well ? ?Plan for follow-up in about 2 to 3 months for CPE as previously discussed ? ? ?___________________________________________ ?Zaden Sako [place], MD, ABFM, CAQSM ?Primary Care and Sports Medicine ?[place] ?

## 2021-06-03 LAB — THYROID PANEL WITH TSH
Free Thyroxine Index: 2.3 (ref 1.2–4.9)
T3 Uptake Ratio: 27 % (ref 24–39)
T4, Total: 8.5 ug/dL (ref 4.5–12.0)
TSH: 0.453 u[IU]/mL (ref 0.450–4.500)

## 2021-06-08 NOTE — Progress Notes (Signed)
Pt was called and I left voicemail to reach out to him about his labs. Told him to call back when he can.

## 2021-06-10 ENCOUNTER — Other Ambulatory Visit: Payer: Self-pay | Admitting: Cardiovascular Disease

## 2021-06-20 ENCOUNTER — Other Ambulatory Visit: Payer: Self-pay | Admitting: Cardiovascular Disease

## 2021-06-23 ENCOUNTER — Ambulatory Visit
Admission: RE | Admit: 2021-06-23 | Discharge: 2021-06-23 | Disposition: A | Discharge status: Home / Self Care | Payer: BC Managed Care – PPO | Source: Ambulatory Visit | Attending: Family Medicine | Admitting: Family Medicine

## 2021-06-23 DIAGNOSIS — E042 Nontoxic multinodular goiter: Secondary | ICD-10-CM

## 2021-06-23 DIAGNOSIS — R7989 Other specified abnormal findings of blood chemistry: Secondary | ICD-10-CM | POA: Diagnosis not present

## 2021-06-23 DIAGNOSIS — Z8547 Personal history of malignant neoplasm of testis: Secondary | ICD-10-CM | POA: Diagnosis not present

## 2021-06-23 DIAGNOSIS — Z9079 Acquired absence of other genital organ(s): Secondary | ICD-10-CM | POA: Diagnosis not present

## 2021-06-27 ENCOUNTER — Other Ambulatory Visit: Payer: Self-pay

## 2021-06-27 ENCOUNTER — Encounter (HOSPITAL_BASED_OUTPATIENT_CLINIC_OR_DEPARTMENT_OTHER): Payer: Self-pay

## 2021-06-27 DIAGNOSIS — R42 Dizziness and giddiness: Secondary | ICD-10-CM | POA: Diagnosis not present

## 2021-06-27 DIAGNOSIS — Z7982 Long term (current) use of aspirin: Secondary | ICD-10-CM | POA: Diagnosis not present

## 2021-06-27 DIAGNOSIS — R079 Chest pain, unspecified: Secondary | ICD-10-CM | POA: Diagnosis not present

## 2021-06-27 LAB — BASIC METABOLIC PANEL
Anion gap: 10 (ref 5–15)
BUN: 19 mg/dL (ref 6–20)
CO2: 25 mmol/L (ref 22–32)
Calcium: 9.1 mg/dL (ref 8.9–10.3)
Chloride: 105 mmol/L (ref 98–111)
Creatinine, Ser: 0.92 mg/dL (ref 0.61–1.24)
GFR, Estimated: >60 mL/min (ref >60)
Glucose, Bld: 124 mg/dL — ABNORMAL HIGH (ref 70–99)
Potassium: 3.7 mmol/L (ref 3.5–5.1)
Sodium: 140 mmol/L (ref 135–145)

## 2021-06-27 LAB — CBC
HCT: 41.4 % (ref 39.0–52.0)
Hemoglobin: 13.9 g/dL (ref 13.0–17.0)
MCH: 29.3 pg (ref 26.0–34.0)
MCHC: 33.6 g/dL (ref 30.0–36.0)
MCV: 87.2 fL (ref 80.0–100.0)
Platelets: 171 10*3/uL (ref 150–400)
RBC: 4.75 MIL/uL (ref 4.22–5.81)
RDW: 12.7 % (ref 11.5–15.5)
WBC: 9.4 10*3/uL (ref 4.0–10.5)
nRBC: 0 % (ref 0.0–0.2)

## 2021-06-27 LAB — TROPONIN I (HIGH SENSITIVITY): Troponin I (High Sensitivity): 5 ng/L (ref <18)

## 2021-06-27 NOTE — ED Triage Notes (Signed)
Patient here POV from Home.  Endorses Dizziness since Yesterday at 1200. Constant since and Intermittent in Intensity.  CP Intermittently associated with SOB. Moderate Nausea. No Emesis. No Fevers. No Diarrhea.  NAD Noted during Triage. A&Ox4. GCS 15. Ambulatory.

## 2021-06-28 ENCOUNTER — Emergency Department (HOSPITAL_BASED_OUTPATIENT_CLINIC_OR_DEPARTMENT_OTHER)
Admission: EM | Admit: 2021-06-28 | Discharge: 2021-06-28 | Disposition: A | Discharge status: Home / Self Care | Payer: BC Managed Care – PPO | Attending: Emergency Medicine | Admitting: Emergency Medicine

## 2021-06-28 ENCOUNTER — Emergency Department (HOSPITAL_BASED_OUTPATIENT_CLINIC_OR_DEPARTMENT_OTHER): Payer: BC Managed Care – PPO

## 2021-06-28 DIAGNOSIS — R079 Chest pain, unspecified: Secondary | ICD-10-CM | POA: Diagnosis not present

## 2021-06-28 DIAGNOSIS — R42 Dizziness and giddiness: Secondary | ICD-10-CM

## 2021-06-28 DIAGNOSIS — R0602 Shortness of breath: Secondary | ICD-10-CM | POA: Diagnosis not present

## 2021-06-28 LAB — TROPONIN I (HIGH SENSITIVITY): Troponin I (High Sensitivity): 4 ng/L (ref <18)

## 2021-06-28 MED ORDER — MECLIZINE HCL 25 MG PO TABS: 25.0000 mg | ORAL_TABLET | Freq: Three times a day (TID) | ORAL | 0 refills | Status: DC | PRN

## 2021-06-28 NOTE — ED Provider Notes (Signed)
[place] EMERGENCY DEPT Provider Note   CSN: [id number] Arrival date & time: 06/27/21  2033     History  Chief Complaint  Patient presents with   Dizziness    Cesar Woods is a 56 y.o. male.  The history is provided by the patient.  Dizziness Quality:  Lightheadedness Severity:  Moderate Onset quality:  Gradual Timing:  Constant Progression:  Improving Chronicity:  New Context: standing up   Worsened by:  Standing up Associated symptoms: no diarrhea, no headaches, no hearing loss, no syncope, no tinnitus, no vision changes, no vomiting and no weakness   Risk factors: heart disease   Patient presents for evaluation of lightheadedness.  He reports during the day on May 20 he began having lightheadedness.  Denies any vertigo.  Reports when he stood up he felt lightheaded but no syncope.  No focal weakness.  No strokelike symptoms.  No recent viral illnesses.  No vomiting or diarrhea.  No new medications or adjustments. He is now feeling improved.  He also mention chest pain when he first checked in, but he reports this is been a chronic issue.  No active chest pain at this time.  He reports he is scheduled for outpatient echocardiogram as his cardiologist is evaluating this    Home Medications Prior to Admission medications   Medication Sig Start Date End Date Taking? Authorizing Provider  meclizine (ANTIVERT) 25 MG tablet Take 1 tablet (25 mg total) by mouth 3 (three) times daily as needed for dizziness. 06/28/21  Yes Ripley Fraise, MD  aspirin EC 81 MG tablet Take 1 tablet (81 mg total) by mouth daily. Patient taking differently: Take 81 mg by mouth at bedtime. 01/03/19   Troy Sine, MD  atorvastatin (LIPITOR) 80 MG tablet TAKE 1 TABLET(80 MG) BY MOUTH DAILY 04/16/21   Troy Sine, MD  carvedilol (COREG) 25 MG tablet TAKE 1 TABLET(25 MG) BY MOUTH TWICE DAILY WITH A MEAL 05/11/21   Troy Sine, MD  clopidogrel (PLAVIX) 75 MG tablet TAKE 1  TABLET(75 MG) BY MOUTH DAILY 06/11/21   Troy Sine, MD  ENTRESTO 24-26 MG TAKE 1 TABLET BY MOUTH TWICE DAILY 05/08/21   Troy Sine, MD  EPIPEN 2-PAK 0.3 MG/0.3ML SOAJ injection INJECT 0.3 MLS INTO THE MUSCLE ONCE **PATIENT NEEDS TO SCHEDULE AN OFFICE VISIT** Patient taking differently: Inject 0.3 mg into the muscle as needed (for an allergic reaction). 06/12/13   Yoo, Doe-Hyun R, DO  nitroGLYCERIN (NITROSTAT) 0.4 MG SL tablet DISSOLVE 1 TABLET UNDER THE TONGUE EVERY 5 MINUTES AS NEEDED FOR CHEST PAIN FOR 3 DOSES 06/22/21   Troy Sine, MD  omeprazole (PRILOSEC) 20 MG capsule Take 20 mg by mouth at bedtime.     [provider]  Testosterone 1.62 % GEL Apply 4 Pump topically daily.  05/04/19   [provider]      Allergies    Iodine, Shellfish allergy, Penicillins, and Ace inhibitors    Review of Systems   Review of Systems  Constitutional:  Positive for chills. Negative for fever.  HENT:  Negative for hearing loss and tinnitus.   Eyes:  Negative for visual disturbance.  Cardiovascular:  Negative for syncope.       Brief chest pain that has resolved  Gastrointestinal:  Negative for diarrhea and vomiting.  Neurological:  Positive for dizziness and light-headedness. Negative for syncope, speech difficulty, weakness, numbness and headaches.   Physical Exam Updated Vital Signs BP 120/89   Pulse  65   Temp 99.1 F (37.3 C)   Resp 15   Ht 1.829 m (6')   Wt 44.8 kg   SpO2 96%   BMI 13.40 kg/m  Physical Exam CONSTITUTIONAL: Well developed/well nourished HEAD: Normocephalic/atraumatic EYES: EOMI/PERRL, no nystagmus, no ptosis ENMT: Mucous membranes moist, TMs occluded by cerumen NECK: supple no meningeal signs CV: S1/S2 noted, no murmurs/rubs/gallops noted LUNGS: Lungs are clear to auscultation bilaterally, no apparent distress ABDOMEN: soft, nontender, no rebound or guarding GU:no cva tenderness NEURO:Awake/alert, face symmetric, no arm or leg drift is  noted Equal 5/5 strength with shoulder abduction, elbow flex/extension, wrist flex/extension in upper extremities and equal hand grips bilaterally Equal 5/5 strength with hip flexion,knee flex/extension, foot dorsi/plantar flexion Cranial nerves 3/4/5/6/08/16/08/11/12 tested and intact Gait normal without ataxia No past pointing Sensation to light touch intact in all extremities EXTREMITIES: pulses normal, full ROM SKIN: warm, color normal PSYCH: no abnormalities of mood noted  ED Results / Procedures / Treatments   Labs (all labs ordered are listed, but only abnormal results are displayed) Labs Reviewed  BASIC METABOLIC PANEL - Abnormal; Notable for the following components:      Result Value   Glucose, Bld 124 (*)    All other components within normal limits  CBC  TROPONIN I (HIGH SENSITIVITY)  TROPONIN I (HIGH SENSITIVITY)    EKG EKG Interpretation  Date/Time:  Saturday Jun 27 2021 21:16:58 EDT Ventricular Rate:  84 PR Interval:  190 QRS Duration: 84 QT Interval:  358 QTC Calculation: 423 R Axis:   9 Text Interpretation: Normal sinus rhythm Cannot rule out Anteroseptal infarct (cited on or before 09-Jan-2019) Abnormal ECG Confirmed by Ripley Fraise ([id number]) on 06/28/2021 4:18:33 AM  Radiology DG Chest Port 1 View  Result Date: 06/28/2021 CLINICAL DATA:  Chest pain, shortness of breath and dizziness. EXAM: PORTABLE CHEST 1 VIEW COMPARISON:  PA Lat 03/01/2021. FINDINGS: Heart size and vascular pattern are normal. The aorta is tortuous with scattered calcification. There is stent in the LAD coronary artery. The mediastinal configuration is stable. The lungs are clear. The sulci are sharp. No acute osseous abnormality. IMPRESSION: No evidence of acute chest disease or interval changes. Electronically Signed   By: Telford Nab M.D.   On: 06/28/2021 03:06    Procedures Procedures    Medications Ordered in ED Medications - No data to display  ED Course/ Medical Decision  Making/ A&P Clinical Course as of 06/28/21 0541  Sun Jun 28, 2021  0419 Glucose(!): 124 Mild hyperglycemia [DW]  0455 Patient with significant history of coronary artery disease presents with lightheadedness.  He denies vertiginous symptoms.  He denies any focal weakness or visual changes.  No slurred speech.  No previous history of stroke.  By the time of my evaluation he was already improving.  Orthostatic vital signs were negative [DW]  0456 Patient denies any active chest pain, and his troponins were negative.  I reviewed current EKG and recent EKG, no acute findings or changes [DW]  0539 Overall patient is improved.  He had no focal neurodeficits.  He is ambulatory.  Is possible he did have some vertigo earlier but none at this time.  Will provide Antivert just in case.  No indication for advanced neuro imaging at this time [DW]  0540 Low suspicion for ACS at this time as patient without any chest pain or shortness of breath at this time.  EKG is unchanged, troponin is negative [DW]    Clinical Course User Index [DW]  Ripley Fraise, MD                           Medical Decision Making Amount and/or Complexity of Data Reviewed Labs: ordered. Decision-making details documented in ED Course.   This patient presents to the ED for concern of lightheadedness, this involves an extensive number of treatment options, and is a complaint that carries with it a high risk of complications and morbidity.  The differential diagnosis includes but is not limited to cardiac arrhythmia, acute coronary syndrome, electrolyte abnormality, CVA, orthostatic hypotension  Comorbidities that complicate the patient evaluation: Patient's presentation is complicated by their history of coronary artery disease   Additional history obtained: Additional history obtained from spouse Records reviewed  cardiology notes  Lab Tests: I Ordered, and personally interpreted labs.  The pertinent results include: Mild  hyperglycemia  Imaging Studies ordered: I ordered imaging studies including X-ray chest   I independently visualized and interpreted imaging which showed no acute findings I agree with the radiologist interpretation  Cardiac Monitoring: The patient was maintained on a cardiac monitor.  I personally viewed and interpreted the cardiac monitor which showed an underlying rhythm of:  sinus rhythm   Reevaluation: After the interventions noted above, I reevaluated the patient and found that they have :improved  Complexity of problems addressed: Patient's presentation is most consistent with  acute presentation with potential threat to life or bodily function  Disposition: After consideration of the diagnostic results and the patient's response to treatment,  I feel that the patent would benefit from discharge   .         Final Clinical Impression(s) / ED Diagnoses Final diagnoses:  Dizziness    Rx / DC Orders ED Discharge Orders          Ordered    meclizine (ANTIVERT) 25 MG tablet  3 times daily PRN        06/28/21 0540              Ripley Fraise, MD 06/28/21 [phone]

## 2021-06-28 NOTE — Discharge Instructions (Signed)
    RETURN IMMEDIATELY IF YOU HAVE ANY OF THE FOLLOWING (call 911): Increasing vertigo, earache, ear drainage, or loss of hearing.  Severe headache, blurred or double vision, or trouble walking.  Fainting or poorly responsive, extreme weakness, chest pain, or palpitations.  Fever, persistent vomiting, or dehydration.  Numbness, tingling, incoordination, or weakness of the limbs.  Change in speech, vision, swallowing, understanding, or other concerns.

## 2021-06-30 ENCOUNTER — Other Ambulatory Visit: Payer: Self-pay

## 2021-06-30 ENCOUNTER — Ambulatory Visit (INDEPENDENT_AMBULATORY_CARE_PROVIDER_SITE_OTHER): Payer: BC Managed Care – PPO

## 2021-06-30 DIAGNOSIS — I255 Ischemic cardiomyopathy: Secondary | ICD-10-CM | POA: Diagnosis not present

## 2021-06-30 DIAGNOSIS — I714 Abdominal aortic aneurysm, without rupture, unspecified: Secondary | ICD-10-CM

## 2021-06-30 LAB — ECHOCARDIOGRAM COMPLETE
AR max vel: 3.01 cm2
AV Area VTI: 2.96 cm2
AV Area mean vel: 2.92 cm2
AV Mean grad: 2 mmHg
AV Peak grad: 4.6 mmHg
Ao pk vel: 1.07 m/s
Area-P 1/2: 3.28 cm2
S' Lateral: 3.14 cm

## 2021-06-30 MED ORDER — PERFLUTREN LIPID MICROSPHERE
1.0000 mL | INTRAVENOUS | Status: AC | PRN
  Administered 2021-06-30: 2 mL via INTRAVENOUS

## 2021-07-03 ENCOUNTER — Telehealth: Payer: Self-pay | Admitting: Cardiovascular Disease

## 2021-07-03 DIAGNOSIS — I714 Abdominal aortic aneurysm, without rupture, unspecified: Secondary | ICD-10-CM

## 2021-07-03 NOTE — Telephone Encounter (Signed)
Follow Up:    Pt is returning call from [place] from earlier this week.

## 2021-07-03 NOTE — Telephone Encounter (Signed)
Called and spoke with patient regarding message from Dr. Loletha Grayer and Echo results. Patient is aware and verbalized understanding.   Patient is scheduled to see [name] on 07/15/21   Referral placed to Dr. Trula Slade as well.   Patient is scheduled for CTA's on 07/08/21  Made patient aware of all instructions/appointments.   Patient does have anaphylactic reaction to Iodine, and Shellfish- unsure if patient would need pre medications or not. Patient did have a Cath in 2019- with no pre meds and no reaction to contrast per patient. Advised patient I will forward to MD for direction on Pre medications if needed.   Patient aware of all instructions and verbalized understanding.    From: Sanda Klein, MD  Sent: 06/30/2021  12:30 PM EDT  To: Troy Sine, MD, Serafina Mitchell, MD, *   Patient of [name], had echocardiogram today with incidental finding of very large abdominal aortic aneurysm.  Asymptomatic.  Almyra Free, please schedule for CT angiography of the aorta chest abdomen and pelvis ("dissection protocol").  He already has labs from a ED visit 2 days ago.  Suspect that he will also need an appointment with [name] for cardiac clearance prior to treatment of the AAA.  See if you can find a slot for that please.  Sending this message to Dr. Trula Slade as well, after brief discussion today, to help expedite his appointment.

## 2021-07-07 ENCOUNTER — Other Ambulatory Visit: Payer: Self-pay

## 2021-07-07 MED ORDER — PREDNISONE 50 MG PO TABS: ORAL_TABLET | ORAL | 0 refills | Status: DC

## 2021-07-07 MED ORDER — DIPHENHYDRAMINE HCL 50 MG PO TABS: ORAL_TABLET | ORAL | 0 refills | Status: DC

## 2021-07-08 ENCOUNTER — Ambulatory Visit (HOSPITAL_COMMUNITY)
Admission: RE | Admit: 2021-07-08 | Discharge: 2021-07-08 | Disposition: A | Discharge status: Home / Self Care | Payer: BC Managed Care – PPO | Source: Ambulatory Visit | Attending: Cardiovascular Disease | Admitting: Cardiovascular Disease

## 2021-07-08 ENCOUNTER — Ambulatory Visit (HOSPITAL_COMMUNITY): Payer: BC Managed Care – PPO

## 2021-07-08 DIAGNOSIS — K8020 Calculus of gallbladder without cholecystitis without obstruction: Secondary | ICD-10-CM | POA: Diagnosis not present

## 2021-07-08 DIAGNOSIS — J439 Emphysema, unspecified: Secondary | ICD-10-CM | POA: Diagnosis not present

## 2021-07-08 DIAGNOSIS — I774 Celiac artery compression syndrome: Secondary | ICD-10-CM | POA: Diagnosis not present

## 2021-07-08 DIAGNOSIS — I714 Abdominal aortic aneurysm, without rupture, unspecified: Secondary | ICD-10-CM | POA: Diagnosis not present

## 2021-07-08 DIAGNOSIS — N200 Calculus of kidney: Secondary | ICD-10-CM | POA: Diagnosis not present

## 2021-07-08 DIAGNOSIS — I2510 Atherosclerotic heart disease of native coronary artery without angina pectoris: Secondary | ICD-10-CM | POA: Diagnosis not present

## 2021-07-08 DIAGNOSIS — I723 Aneurysm of iliac artery: Secondary | ICD-10-CM | POA: Diagnosis not present

## 2021-07-08 DIAGNOSIS — I701 Atherosclerosis of renal artery: Secondary | ICD-10-CM | POA: Diagnosis not present

## 2021-07-08 DIAGNOSIS — D3500 Benign neoplasm of unspecified adrenal gland: Secondary | ICD-10-CM | POA: Diagnosis not present

## 2021-07-08 MED ORDER — IOHEXOL 350 MG/ML SOLN
100.0000 mL | Freq: Once | INTRAVENOUS | Status: AC | PRN
  Administered 2021-07-08: 100 mL via INTRAVENOUS

## 2021-07-14 ENCOUNTER — Encounter: Payer: Self-pay | Admitting: Surgery

## 2021-07-14 ENCOUNTER — Ambulatory Visit: Payer: BC Managed Care – PPO | Admitting: Surgery

## 2021-07-14 VITALS — BP 101/69 | HR 84 | Temp 98.0°F | Resp 20 | Ht 72.0 in | Wt 211.0 lb

## 2021-07-14 DIAGNOSIS — I716 Thoracoabdominal aortic aneurysm, without rupture, unspecified: Secondary | ICD-10-CM

## 2021-07-14 NOTE — Progress Notes (Signed)
[facility]  Patient name: Cesar Woods MRN: [id number] DOB: [birth year] Sex: male   REQUESTING PROVIDER:    [name]   REASON FOR CONSULT:    AAA  HISTORY OF PRESENT ILLNESS:   Cesar Woods is a 56 y.o. male, who is referred for evaluation of an abdominal aortic aneurysm that was incidentally found on ultrasound.  He is without abdominal pain or back pain.  The patient does have a history of hypertension.  In December 2016, he presented with substernal chest pain and EKG changes consistent with anterior lateral MI.  Cardiac catheterization on 01/15/2015 revealed a 90% ostial LAD stenosis and a 99% proximal LAD stenosis he then underwent PCI with DES.  He had recurrent symptoms in 2019 and was found to have a 85% narrowing in the mid portion of his long LAD stent, treated with Cutting Balloon angioplasty.  He has mild LV dysfunction with ejection fraction of 40 to 45%.  He has been started on Entresto.  He is on Plavix and Eliquis.  Eliquis is due to his need for testosterone, status post bilateral orchiectomy.  Patient has a history of testicular cancer, status post bilateral orchiectomy and radiation, he is in remission.  He is a former smoker.  He takes a statin for hypercholesterolemia. PAST MEDICAL HISTORY    Past Medical History:  Diagnosis Date   ACS (acute coronary syndrome) ([place]) 11/2015   Atypical chest pain    Negative Myoview 2010   Coronary artery disease    a.s/p STEMI in 01/2015 requiring placement of 4 DES to LAD. b. 11/2015: NST showing apical scar with minimal peri-infarct ischemia.   Essential hypertension    Food allergy    Anaphylaxis with shell fish   GERD (gastroesophageal reflux disease)    History of migraines    Hyperlipidemia    Ischemic cardiomyopathy    a. Echo 05/2015: EF 40-45%, apical akineiss with Grade 1 DD.   Myocardial infarction [facility]) 2016   Testicle cancer (Cesar Woods)     In remission - followed by Dr. Risa Grill; had bilateral orchiectomy and XRT in past     FAMILY HISTORY   Family History  Problem Relation Age of Onset   Heart attack Father        CABG at agae 78   Stroke Paternal Grandfather    Hypertension Other    Hyperlipidemia Other    Heart attack Maternal Grandmother     SOCIAL HISTORY:   Social History   Socioeconomic History   Marital status: Married    Spouse name: Not on file   Number of children: Not on file   Years of education: Not on file   Highest education level: Not on file  Occupational History   Not on file  Tobacco Use   Smoking status: Former   Smokeless tobacco: Never   Tobacco comments:    quit around 2010  Vaping Use   Vaping Use: Never used  Substance and Sexual Activity   Alcohol use: Yes    Alcohol/week: 0.0 standard drinks    Comment: occasionally   Drug use: No    Comment: remote use of marijuana, quit a long time ago   Sexual activity: Not on file  Other Topics Concern   Not on file  Social History Narrative   Occupation:Senior Trader for [employer]   Divorced    No children      Former smoker    Alcohol Use - yes  Social Determinants of Health   Financial Resource Strain: Not on file  Food Insecurity: Not on file  Transportation Needs: Not on file  Physical Activity: Not on file  Stress: Not on file  Social Connections: Not on file  Intimate Partner Violence: Not on file    ALLERGIES:    Allergies  Allergen Reactions   Iodine Anaphylaxis    Shellfish allergy    Shellfish Allergy Anaphylaxis   Penicillins Other (See Comments)    Childhood allergy Has patient had a PCN reaction causing immediate rash, facial/tongue/throat swelling, SOB or lightheadedness with hypotension: Unknown Has patient had a PCN reaction causing severe rash involving mucus membranes or skin necrosis: Unknown Has patient had a PCN reaction that required hospitalization: No Has patient had a PCN reaction  occurring within the last 10 years: No If all of the above answers are "NO", then may proceed with Cephalosporin use.     Ace Inhibitors Cough    CURRENT MEDICATIONS:    Current Outpatient Medications  Medication Sig Dispense Refill   aspirin EC 81 MG tablet Take 1 tablet (81 mg total) by mouth daily. (Patient taking differently: Take 81 mg by mouth at bedtime.) 90 tablet 3   atorvastatin (LIPITOR) 80 MG tablet TAKE 1 TABLET(80 MG) BY MOUTH DAILY 90 tablet 1   carvedilol (COREG) 25 MG tablet TAKE 1 TABLET(25 MG) BY MOUTH TWICE DAILY WITH A MEAL 180 tablet 1   clopidogrel (PLAVIX) 75 MG tablet TAKE 1 TABLET(75 MG) BY MOUTH DAILY 90 tablet 1   ENTRESTO 24-26 MG TAKE 1 TABLET BY MOUTH TWICE DAILY 180 tablet 2   EPIPEN 2-PAK 0.3 MG/0.3ML SOAJ injection INJECT 0.3 MLS INTO THE MUSCLE ONCE **PATIENT NEEDS TO SCHEDULE AN OFFICE VISIT** (Patient taking differently: Inject 0.3 mg into the muscle as needed (for an allergic reaction).) 2 Device 0   meclizine (ANTIVERT) 25 MG tablet Take 1 tablet (25 mg total) by mouth 3 (three) times daily as needed for dizziness. 15 tablet 0   nitroGLYCERIN (NITROSTAT) 0.4 MG SL tablet DISSOLVE 1 TABLET UNDER THE TONGUE EVERY 5 MINUTES AS NEEDED FOR CHEST PAIN FOR 3 DOSES 25 tablet 2   omeprazole (PRILOSEC) 20 MG capsule Take 20 mg by mouth at bedtime.      Testosterone 1.62 % GEL Apply 4 Pump topically daily.      No current facility-administered medications for this visit.    REVIEW OF SYSTEMS:   '[X]'$  denotes positive finding, '[ ]'$  denotes negative finding Cardiac  Comments:  Chest pain or chest pressure: x   Shortness of breath upon exertion: x   Short of breath when lying flat: x   Irregular heart rhythm:        Vascular    Pain in calf, thigh, or hip brought on by ambulation:    Pain in feet at night that wakes you up from your sleep:     Blood clot in your veins:    Leg swelling:         Pulmonary    Oxygen at home:    Productive cough:      Wheezing:         Neurologic    Sudden weakness in arms or legs:     Sudden numbness in arms or legs:     Sudden onset of difficulty speaking or slurred speech:    Temporary loss of vision in one eye:     Problems with dizziness:  x  Gastrointestinal    Blood in stool:      Vomited blood:         Genitourinary    Burning when urinating:     Blood in urine:        Psychiatric    Major depression:         Hematologic    Bleeding problems:    Problems with blood clotting too easily:        Skin    Rashes or ulcers:        Constitutional    Fever or chills:     PHYSICAL EXAM:   Vitals:   07/14/21 1321  BP: 101/69  Pulse: 84  Resp: 20  Temp: 98 F (36.7 C)  SpO2: 93%  Weight: 211 lb (95.7 kg)  Height: 6' (1.829 m)    GENERAL: The patient is a well-nourished male, in no acute distress. The vital signs are documented above. CARDIAC: There is a regular rate and rhythm.  VASCULAR: Palpable dorsalis pedis pulses bilaterally PULMONARY: Nonlabored respirations ABDOMEN: Soft and non-tender.  Radiation tattoo in the midline epigastric area MUSCULOSKELETAL: There are no major deformities or cyanosis. NEUROLOGIC: No focal weakness or paresthesias are detected. SKIN: There are no ulcers or rashes noted. PSYCHIATRIC: The patient has a normal affect.  STUDIES:   I have reviewed the CT scan with the following findings: 1. Two infrarenal aortic aneurysm is measuring up to 6.1 x 8.7 cm, new from 04/05/2008. Recommend referral to a vascular specialist. This recommendation follows ACR consensus guidelines: White Paper of the ACR Incidental Findings Committee II on Vascular Findings. J Am Coll Radiol 2013; 10:789-794. 2. Slight ostial narrowing involving the celiac trunk and left renal artery. 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 3. Cholelithiasis. 4. Left adrenal  adenoma. 5. Tiny left renal stone. 6. Multiple bilateral thyroid nodules. Patient underwent thyroid ultrasound 06/23/2021, at which time biopsy was recommended (ref: J Am Coll Radiol. 2015 Feb;12(2): 143-50). 7. Aortic atherosclerosis (ICD10-I70.0). Coronary artery calcification. 8.  Emphysema (ICD10-J43.9).    ASSESSMENT and PLAN   6 cm thoracoabdominal aneurysm: I had a lengthy discussion with the patient and his wife.  I think his best option is going to be endovascular repair which will require dealing with all 4 visceral vessels.  I think this [facility] best be [facility] at [facility].  I am referring him to Dr. Sammuel Hines.  Another reason I would favor endovascular approach is his intra-abdominal radiation for his testicular cancer.   Leia Alf, MD, FACS [facility] [phone] Pager [phone]

## 2021-07-15 ENCOUNTER — Ambulatory Visit: Payer: BC Managed Care – PPO | Admitting: Cardiovascular Disease

## 2021-07-15 ENCOUNTER — Encounter: Payer: Self-pay | Admitting: Cardiovascular Disease

## 2021-07-15 VITALS — BP 100/70 | HR 68 | Ht 72.0 in | Wt 209.4 lb

## 2021-07-15 DIAGNOSIS — I2510 Atherosclerotic heart disease of native coronary artery without angina pectoris: Secondary | ICD-10-CM

## 2021-07-15 DIAGNOSIS — I252 Old myocardial infarction: Secondary | ICD-10-CM | POA: Diagnosis not present

## 2021-07-15 DIAGNOSIS — E782 Mixed hyperlipidemia: Secondary | ICD-10-CM

## 2021-07-15 DIAGNOSIS — E785 Hyperlipidemia, unspecified: Secondary | ICD-10-CM

## 2021-07-15 DIAGNOSIS — I714 Abdominal aortic aneurysm, without rupture, unspecified: Secondary | ICD-10-CM

## 2021-07-15 DIAGNOSIS — I255 Ischemic cardiomyopathy: Secondary | ICD-10-CM | POA: Diagnosis not present

## 2021-07-15 DIAGNOSIS — Z9861 Coronary angioplasty status: Secondary | ICD-10-CM

## 2021-07-15 NOTE — Progress Notes (Signed)
Patient ID: Cesar Woods, male   DOB: 1965-03-21, 56 y.o.   MRN: [id number]    Primary MD: [name]  HPI: Cesar Woods is a 56 y.o. male who presents to the office today for a follow-up cardiology evaluation.  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  In December, he developed substernal chest discomfort which he initially attributed to GERD.  This continued to recur over that day.  The following day he went to work and continued to experience vague discomfort.  He was seen by his primary physician, Dr. Scarlette Ar in his ECG was abnormal.  Changes of an anterolateral MI.  He presented to the emergency room.  He was not having any chest pain or shortness of breath or palpitations.  He underwent cardiac catheterization the following morning on 01/15/2015 which revealed significant CAD with 90% ostial LAD stenosis followed by 99% proximal LAD stenosis in the region of the first septal perforating artery with significant thrombus.  There was diffuse 50% stenosis followed by 95% ulcerated plaque in the proximal to mid LAD.  There also appeared to be an apical LAD dissection.  40% ramus intermediate, like high marginal stenosis and a large dominant RCA with 30% mid stenosis.  Large PDA and PLA vessels with septal collateralization to the proximal LAD.  He was felt to have an out of hospital late presentation anterolateral infarction secondary to his multiple high-grade stenoses.  He underwent a very difficult but successful intervention to his diffusely diseased LAD.  There was evidence for a spiral dissection in the mid distal LAD but ultimately was insertion of 3 tandem synergy DES stents (0.538, 3.038, and 3.020 mm from the mid LAD to the ostium and PTCA of the distal LAD with low-level inflation tach up his apical initial dissection.  There was total occlusion of the diagonal vessel antegrade  but there was significant development of retrograde collaterals to this diagonal vessel from the distal LAD.  Subsequent, he stabilized well.  He was seen in follow-up by Tenny Craw on 01/24/2015 and was doing well without chest pain or shortness of breath.  At that time his blood pressure was controlled.  He participated in the cardiac rehabilitation program.  He is back at work and has a stressful job in Engineer, mining for [employer].   When I saw him for follow-up evaluation in March 2017.  His ECG showed Q waves in V1 through V4 4 with persistent T-wave inversion anteriorly and anterolaterally.  I recommended that he discontinue metoprolol succinate and changed him to carvedilol.  He is now on carvedilol at 6.25 mg twice a day and is tolerating this well.  He continues to be on low-dose losartan 12.5 mg.  I scheduled him for follow-up echo Doppler study to reassess his LV function.  This was done on 05/30/2015.  Ejection fraction was now 40-45% and there was evidence for akinesis of the apical myocardium.  There was grade 1 diastolic dysfunction.  When I saw him in May 2017 for follow-up evaluation, he denied recurrent chest pain, PND or orthopnea.  At time, I slowly titrated ARB therapy and increase losartan to 25 mg at bedtime.  So increased his carvedilol to 9.375 mg twice a day for 2 weeks and further titrated this to 12.5 mg daily.   He developed a recurrent episode of chest pain in October 2017 and was hospitalized.  A nuclear  perfusion study was performed which showed his previous large fixed defect involving the apex extending towards the mid ventricle in the anterior and lateral wall.  There is very minimal if any peri-infarct ischemia with an EF of 43%.  These were felt to be consistent with his prior MI.    Since I  saw him in March 2018 he had been evaluated in the emergency room with intermittent chest and back pain.  He had undergone a stress Myoview study in January 2019 which showed an  EF of 40% with a large defect consistent with his prior LAD infarction and there was no significant ischemia except mild peri-infarction ischemia.  In July 2019 he developed recurrent symptomatology and when I was on vacation and underwent repeat catheterization by Dr. Ellyn Hack.  He was found to have 85% narrowing in the midportion of his long LAD stent in the region of the diagonal takeoff.  The diagonal vessel was occluded but had excellent collaterals.  He was rehospitalized in October 2019 with recurrent chest pain.  At that time on November 09, 2017 I performed repeat cardiac catheterization.  This revealed a widely patent LAD stent with mild residual narrowing of 20% at the site of recent in-stent restenosis that was treated with Cutting Balloon in July 2019.  The diagonal branch which arose from this segment has both antegrade filling and extensive retrograde collateralization being filled from the apical LAD.  The left circumflex marginal vessel had 30 and 35% proximal to mid stenoses.  The RCA was a large dominant vessel with smooth 20% narrowing.  He had mild to moderate LV dysfunction and his ejection fraction was improved to 40 to 45% with apical kinesis and distal anterolateral hypocontractility.  His apical contour raised concern for possible apical thrombus.  An echo Doppler study was suspicious for a layer of mural thrombus apically which was different from July 2019.  As result he was started on apixaban.  I saw him for follow-up on November 28, 2017 at which time he continued to remain stable and has been without chest pain.  He is on Eliquis 5 mg, Plavix 75 mg, carvedilol 25 mg twice a day, atorvastatin 80 mg and has been on losartan 25 mg daily.  He denies chest tightness, baseline shortness of breath but had noted some mild shortness of breath with exertion.  During that evaluation, I discontinued losartan and initiated Entresto.  He took 24/25 mg twice daily for 2 weeks and was then further  titrated up to 49/51 mg twice a day.  He has continued to be on atorvastatin 80 mg with target LDL less than 70.  I  saw him in December 2019 at which time he was feeling improved with initiation of Entresto since initiating.  Over the past year, he has been titrated up to maximal dosing at 97/103 mg twice a day.  He has continued to take carvedilol 25 mg twice a day.  He has been on Eliquis 5 mg twice a day with Plavix and has been on Prilosec.  He was evaluated in August 2020 by Dr. Davina Poke prior to undergoing colonoscopy given instructions to hold his Plavix and Eliquis.  The past he was documented to have an apical mural thrombus and on his most recent echo in March 2020 EF was 40 to 45%.  There was septal and apical akinesis secondary to his prior MI.  There was no apical thrombus noted which had resolved from his previous go.  During that evaluation  blood pressure was low.  When I saw him on January 03, 2019  he had noticed some episodes of mild dizziness.  He denied any palpitations or awareness of arrhythmia.  He had experienced some depression secondary to the COVID-19 pandemic.    During that evaluation his blood pressure was low and on exam there was a 10 mm orthostatic drop from supine to standing.  Consequently I reduced his Entresto back to 49/51 mg twice a day.  Since it was well over a year from his ACS and stenting I recommended discontinuance of Plavix and resuming aspirin 81 mg to take along with Eliquis 5 mg twice a day.  He underwent a follow-up echo Doppler study on April 30, 2019 which showed an EF of 35 to 40%.  There was grade 1 diastolic dysfunction.  There was severe akinesis of the apex and distal anteroseptal new from lateral wall.  Definity contrast was used and there was no apical thrombus.  I saw him in April 2021. At that time, he felt well and had both Covid vaccinations.  He was be planning to undergo a colonoscopy in May.  He denied chest pain PND orthopnea.  He  denied any orthostatic symptoms.  During that evaluation, he apparently was now on a further reduced dose of Entresto 24/26. His blood pressure was stable. I reviewed his echo Doppler study which showed an EF of 35 to 40% without thrombus. As result I discontinued Eliquis and recommend mended resumption of Plavix plus aspirin. I did recommend he undergo a G2R42 test to make certain his Plavix responsive.  I saw him in November 2021.  He had developed some episodes of palpitations in early August 2021. He went to the emergency room. Laboratory was drawn. Troponin was negative. BNP was excellent at 24.4. There was a 13-hour wait and ultimately he ended up being told to leave since his labs were normal. He did subsequently wear a monitor from August 22 through October 29, 2019 which showed predominant sinus rhythm at 78 bpm. Slowest heart rate was 60 bpm which was sinus rhythm and his fastest heart rate was sinus tachycardia at 126 bpm which occurred at 12:55 AM. Subsequently, he has felt well. He denied chest pain PND orthopnea. He realized today that he had never had a P2 Y12 test done and he plans to do this.   Since I last saw him, he had developed episodes of some palpitations and went to the ED in [place] on May 08, 2020.  Troponins were negative.  Heart rate was increased at 110.  Had another episode where he noticed his heart rate increasing at a concert in [place].  He was seen on May 30, 2020 by Jory Sims, NP.  During her how evaluation he was stable.  He had undergone an echo Doppler study on April 29, 2020 which showed an EF at 35 to 70% grade 1 diastolic dysfunction.  Laboratory in April 2022 showed total cholesterol 100 triglycerides 183 HDL 30 and LDL 40.  Sh was slightly over suppressed at 0.34.  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  I saw  him on October 06, 2020.  He was planning to go to [place] in [place] this weekend with his wife.  Last week he noted some twinges of nonexertional which occurred when he was sitting down and also an occasional skipped beat.  He apparently  went to [facility] ER and was stable.  In that evaluation, his ECG was unremarkable and showed his old anterior wall MI with QS complex V1 through V5.  His primary care doctor had retired and he was in Secretary/administrator for a new primary physician.  I last saw him in March 10, 2021, at which time he continued to feel well.  He has continued to feel well.  However he was recently evaluated at [facility] ER with atypical chest pain.  He had experienced some sharp twinges of chest pain which were nonexertional.  Troponins were normal.  ECG was stable.  He was not felt to have an ischemic etiology.  He denied dizziness on his reduced dose of Entresto and continued to be on 24/26 mg twice daily in addition to carvedilol 25 mg twice a day.  His BNP level on March 01, 2021 was excellent at 12.1.  Creatinine was stable at 0.78.  CBC was stable.  During that evaluation, he was euvolemic on exam and was without CHF.  I recommended that he undergo a follow-up echo Doppler study prior to his next office visit.  Since I last saw him, Cesar Woods has felt well and has been without recurrent chest pain or significant shortness of breath.  He underwent his echo Doppler evaluation on Jun 30, 2021 this was notable for EF at 45 to 50% aneurysmal dilatation of the apex.  It was mild dilation of his left atrium.  Mitral and aortic valves were essentially normal.  Only, he was noted to have a very large abdominal aortic aneurysm with a maximum diameter of 6.3 cm.  As result, I discussed this with Dr. Annamarie Major who I saw in the hospital the day of his echo  scheduled the patient to have a CT angio of his chest, abdomen and pelvis with planned expeditious follow-up with Dr. Trula Slade.  The CT scan was done  on Jul 08, 2021 which revealed 2 infrarenal aortic aneurysm measuring up to 6.1 x 8.7 cm.  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  Incidentally he was also noted to have cholelithiasis, left adrenal adenoma, tiny left renal stone and multiple bilateral thyroid nodules in addition to aortic atherosclerosis with coronary artery calcification and mild emphysema.  He was evaluated by Dr. Velta Addison in the office yesterday, July 14, 2021 and was recommended that the best option would be endovascular repair which would require dealing with all 4 visceral vessels.  As result Cesar Woods has been referred to Dr. Sammuel Hines at [facility] and has an appointment to see him tomorrow.  He presents for cardiology evaluation today.  Past Medical History:  Diagnosis Date   ACS (acute coronary syndrome) ([place]) 11/2015   Atypical chest pain    Negative Myoview 2010   Coronary artery disease    a.s/p STEMI in 01/2015 requiring placement of 4 DES to LAD. b. 11/2015: NST showing apical scar with minimal peri-infarct ischemia.   Essential hypertension    Food allergy    Anaphylaxis with shell fish   GERD (gastroesophageal reflux disease)    History of migraines    Hyperlipidemia    Ischemic cardiomyopathy    a. Echo 05/2015: EF 40-45%, apical akineiss with Grade 1 DD.   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 Risa Grill; had bilateral orchiectomy and XRT in past    Past Surgical History:  Procedure Laterality Date   CARDIAC CATHETERIZATION N/A 01/15/2015   Procedure: Left Heart Cath and Coronary Angiography;  Surgeon: Troy Sine, MD;  Location: [place] [facility];  Service: Cardiovascular;  Laterality: N/A;   CARDIAC CATHETERIZATION  01/15/2015   Procedure: Coronary Stent Intervention;   Surgeon: Troy Sine, MD;  Location: [facility] [facility];  Service: Cardiovascular;;   CORONARY BALLOON ANGIOPLASTY N/A 08/17/2017   Procedure: CORONARY BALLOON ANGIOPLASTY;  Surgeon: Leonie Man, MD;  Location: [facility];  Service: Cardiovascular;  Laterality: N/A;   LEFT HEART CATH AND CORONARY ANGIOGRAPHY N/A 08/17/2017   Procedure: LEFT HEART CATH AND CORONARY ANGIOGRAPHY;  Surgeon: Leonie Man, MD;  Location: [facility];  Service: Cardiovascular;  Laterality: N/A;   LEFT HEART CATH AND CORONARY ANGIOGRAPHY N/A 11/09/2017   Procedure: LEFT HEART CATH AND CORONARY ANGIOGRAPHY;  Surgeon: Troy Sine, MD;  Location: [facility] [facility];  Service: Cardiovascular;  Laterality: N/A;   ORCHIECTOMY Bilateral     Allergies  Allergen Reactions   Iodine Anaphylaxis    Shellfish allergy    Shellfish Allergy Anaphylaxis   Penicillins Other (See Comments)    Childhood allergy Has patient had a PCN reaction causing immediate rash, facial/tongue/throat swelling, SOB or lightheadedness with hypotension: Unknown Has patient had a PCN reaction causing severe rash involving mucus membranes or skin necrosis: Unknown Has patient had a PCN reaction that required hospitalization: No Has patient had a PCN reaction occurring within the last 10 years: No If all of the above answers are "NO", then may proceed with Cephalosporin use.     Ace Inhibitors Cough    Current Outpatient Medications  Medication Sig Dispense Refill   aspirin EC 81 MG tablet Take 1 tablet (81 mg total) by mouth daily. (Patient taking differently: Take 81 mg by mouth at bedtime.) 90 tablet 3   atorvastatin (LIPITOR) 80 MG tablet TAKE 1 TABLET(80 MG) BY MOUTH DAILY 90 tablet 1   carvedilol (COREG) 25 MG tablet TAKE 1 TABLET(25 MG) BY MOUTH TWICE DAILY WITH A MEAL 180 tablet 1   clopidogrel (PLAVIX) 75 MG tablet TAKE 1 TABLET(75 MG) BY MOUTH DAILY 90 tablet 1   ENTRESTO 24-26 MG TAKE 1 TABLET BY MOUTH TWICE  DAILY 180 tablet 2   EPIPEN 2-PAK 0.3 MG/0.3ML SOAJ injection INJECT 0.3 MLS INTO THE MUSCLE ONCE **PATIENT NEEDS TO SCHEDULE AN OFFICE VISIT** (Patient taking differently: Inject 0.3 mg into the muscle as needed (for an allergic reaction).) 2 Device 0   meclizine (ANTIVERT) 25 MG tablet Take 1 tablet (25 mg total) by mouth 3 (three) times daily as needed for dizziness. 15 tablet 0   nitroGLYCERIN (NITROSTAT) 0.4 MG SL tablet DISSOLVE 1 TABLET UNDER THE TONGUE EVERY 5 MINUTES AS NEEDED FOR CHEST PAIN FOR 3 DOSES 25 tablet 2   omeprazole (PRILOSEC) 20 MG capsule Take 20 mg by mouth at bedtime.      Testosterone 1.62 % GEL Apply 4 Pump topically daily.      No current facility-administered medications for this visit.    Social History   Socioeconomic History   Marital status: Married    Spouse name: Not on file   Number of children: Not on file   Years of education: Not on file   Highest education level: Not on file  Occupational History   Not on file  Tobacco Use   Smoking status: Former   Smokeless tobacco: Never   Tobacco comments:    quit around 2010  Vaping Use   Vaping Use: Never used  Substance and Sexual Activity   Alcohol use: Yes    Alcohol/week: 0.0 standard drinks    Comment: occasionally   Drug use: No    Comment: remote use of marijuana, quit a long time ago   Sexual activity: Not on file  Other Topics Concern   Not on file  Social History Narrative   Occupation:Senior Trader for [employer]   Divorced    No children      Former smoker    Alcohol Use - yes         Social Determinants of Health   Financial Resource Strain: Not on file  Food Insecurity: Not on file  Transportation Needs: Not on file  Physical Activity: Not on file  Stress: Not on file  Social Connections: Not on file  Intimate Partner Violence: Not on file   Additional social history is notable that he was born in [place], [place].  He lived for over a decade in [place].  He is  married.  Former smoker.  Family History  Problem Relation Age of Onset   Heart attack Father        CABG at agae 35   Stroke Paternal Grandfather    Hypertension Other    Hyperlipidemia Other    Heart attack Maternal Grandmother     ROS General: Negative; No fevers, chills, or night sweats HEENT: Negative; No changes in vision or hearing, sinus congestion, difficulty swallowing Pulmonary: Negative; No cough, wheezing, shortness of breath, hemoptysis Cardiovascular: See HPI:  GI: Negative; No nausea, vomiting, diarrhea, or abdominal pain GU: History of testicular cancer, status post orchiectomy. Musculoskeletal: Negative; no myalgias, joint pain, or weakness Hematologic: Negative; no easy bruising, bleeding Endocrine: Negative; no heat/cold intolerance; no diabetes, Neuro: Negative; no changes in balance, headaches Skin: Negative; No rashes or skin lesions Psychiatric: Negative; No behavioral problems, depression Sleep: Negative; No snoring,  daytime sleepiness, hypersomnolence, bruxism, restless legs, hypnogognic hallucinations. Other comprehensive 14 point system review is negative   Physical Exam BP 100/70 (BP Location: Left Arm)   Pulse 68   Ht 6' (1.829 m)   Wt 209 lb 6.4 oz (95 kg)   SpO2 98%   BMI 28.40 kg/m    Repeat blood pressure by me was 112/70  Wt Readings from Last 3 Encounters:  07/15/21 209 lb 6.4 oz (95 kg)  07/14/21 211 lb (95.7 kg)  06/27/21 98 lb 12.8 oz (44.8 kg)    General: Alert, oriented, no distress.  Skin: normal turgor, no rashes, warm and dry HEENT: Normocephalic, atraumatic. Pupils equal round and reactive to light; sclera anicteric; extraocular muscles intact; Fundi ** Nose without nasal septal hypertrophy Mouth/Parynx benign; Mallinpatti scale 3 Neck: No JVD, no carotid bruits; normal carotid upstroke Lungs: clear to ausculatation and percussion; no wheezing or rales Chest wall: without tenderness to palpitation Heart: PMI not  displaced, RRR, s1 s2 normal, 1/6 systolic murmur, no diastolic murmur, no rubs, gallops, thrills, or heaves Abdomen: soft, nontender; no hepatosplenomehaly, BS+; abdominal aorta nontender but significantly dilated by palpation.   Back: no CVA tenderness Pulses 2+ Musculoskeletal: full range of motion, normal strength, no joint deformities Extremities: no clubbing cyanosis or edema, Homan's sign negative  Neurologic: grossly nonfocal; Cranial nerves grossly wnl Psychologic: Normal mood and affect  July 15, 2021 ECG (independently read by  me): NSR at 48, old anteroseptal MI with Q V1-4; no ectopy  March 10, 2021 ECG (independently read by me): Sinus rhythm at 71 bpm.  QS complex V1 through V5.  Normal intervals.  No ectopy.  October 06, 2020 ECG (independently read by me):  NSR at 78; possible LAE, QS V1-5 c/w prior anterior MI  November 2021 ECG (independently read by me): Normal sinus rhythm at 78 bpm.  QS complex V1 through V4 unchanged.  No ectopy.  April 2021 ECG (independently read by me): NSR at 72; QS V1-4 c/w prior anterior MI: no ectopy; normal intervals  November 2020 ECG (independently read by me): Normal sinus rhythm at 78 bpm.  QS complex V1 through V4 consistent with prior anterior MI.  No ectopy.  QTc interval 389 ms.  Parable '1 7 8 ' ms  December 2019 ECG (independently read by me): Normal sinus rhythm at 75 bpm.  QS complex V1 through V4 consistent with old anterior MI.  Normal intervals.  No ectopy.  November 28, 2017 ECG (independently read by me): Normal sinus rhythm at 83 bpm.  QS complex V1 through V5 consistent with old anterior MI.  Normal intervals.  No ectopy.  March 2019 ECG (independently read by me): Normal sinus rhythm at 79 bpm.  Old anterolateral infarct changes.  QRS complex V1 through V5; normal intervals.  No significant ST changes.  January 2018 ECG (independently read by me): Normal sinus rhythm at 92 bpm.  Anterior Q waves consistent with his prior  anterolateral MI.  September 2017 ECG (independently read by me): Sinus rhythm at 83 bpm.  QRS complex V1-V6  concordant with his large anterolateral MI.  May 2017 ECG (independently read by me): Normal sinus rhythm at 79 bpm.  T-wave inversion V2 through V6 and 1 and aVL concordant with his prior anterior to anterolateral MI.  March 2017 ECG (independently read by me): NSR at 80; QS V1-4 with T wave inversion V1-V6, I and aVL  LABS:     Latest Ref Rng & Units 06/27/2021    9:16 PM 03/01/2021   11:49 AM 09/10/2019   11:47 AM  BMP  Glucose 70 - 99 mg/dL 124   107   122    BUN 6 - 20 mg/dL '19   18   13    ' Creatinine 0.61 - 1.24 mg/dL 0.92   0.78   0.91    Sodium 135 - 145 mmol/L 140   140   138    Potassium 3.5 - 5.1 mmol/L 3.7   3.9   3.9    Chloride 98 - 111 mmol/L 105   104   103    CO2 22 - 32 mmol/L '25   28   26    ' Calcium 8.9 - 10.3 mg/dL 9.1   9.1   8.8          Latest Ref Rng & Units 05/30/2020    9:30 AM 05/29/2019    9:55 AM 02/07/2018   12:16 PM  Hepatic Function  Total Protein 6.0 - 8.5 g/dL 7.1   6.5   6.3    Albumin 3.8 - 4.9 g/dL 4.5   4.3   4.3    AST 0 - 40 IU/L '12   13   15    ' ALT 0 - 44 IU/L '16   14   21    ' Alk Phosphatase 44 - 121 IU/L 114   102   97    Total  Bilirubin 0.0 - 1.2 mg/dL 1.9   1.7   1.3    Bilirubin, Direct 0.00 - 0.40 mg/dL 0.37           Latest Ref Rng & Units 06/27/2021    9:16 PM 03/01/2021   11:49 AM 09/10/2019   11:47 AM  CBC  WBC 4.0 - 10.5 K/uL 9.4   9.8   8.4    Hemoglobin 13.0 - 17.0 g/dL 13.9   13.8   14.7    Hematocrit 39.0 - 52.0 % 41.4   41.1   44.0    Platelets 150 - 400 K/uL 171   163   193     Lab Results  Component Value Date   MCV 87.2 06/27/2021   MCV 88.2 03/01/2021   MCV 89.1 09/10/2019    Lab Results  Component Value Date   TSH 0.453 06/02/2021    BNP    Component Value Date/Time   BNP 12.1 03/01/2021 1149   BNP <2.0 06/26/2010 1334    ProBNP    Component Value Date/Time   PROBNP 46 02/07/2018 1216    PROBNP <2.0 pg/mL 10/17/2009 1834     Lipid Panel     Component Value Date/Time   CHOL 84 (L) 10/06/2020 1032   TRIG 105 10/06/2020 1032   HDL 28 (L) 10/06/2020 1032   CHOLHDL 3.0 10/06/2020 1032   CHOLHDL 3.5 11/09/2017 0356   VLDL 27 11/09/2017 0356   LDLCALC 36 10/06/2020 1032   LDLDIRECT 99.9 09/15/2012 1738     RADIOLOGY: DG Chest Port 1 View  Result Date: 06/28/2021 CLINICAL DATA:  Chest pain, shortness of breath and dizziness. EXAM: PORTABLE CHEST 1 VIEW COMPARISON:  PA Lat 03/01/2021. FINDINGS: Heart size and vascular pattern are normal. The aorta is tortuous with scattered calcification. There is stent in the LAD coronary artery. The mediastinal configuration is stable. The lungs are clear. The sulci are sharp. No acute osseous abnormality. IMPRESSION: No evidence of acute chest disease or interval changes. Electronically Signed   By: Telford Nab M.D.   On: 06/28/2021 03:06   ECHOCARDIOGRAM COMPLETE  Result Date: 06/30/2021    ECHOCARDIOGRAM REPORT   Patient Name:   Cesar Woods Date of Exam: 06/30/2021 Medical Rec #:  [id number]        Height:       72.0 in Accession #:    [id number]       Weight:       212.8 lb Date of Birth:  1965-03-30        BSA:          2.188 m Patient Age:    24 years         BP:           110/80 mmHg Patient Gender: M                HR:           86 bpm. Exam Location:  Outpatient Procedure: 2D Echo, Color Doppler, Cardiac Doppler and Intracardiac            Opacification Agent Indications:    I25.5 Ischemic cardiomyopathy; R42 Lightheaded  History:        Patient has prior history of Echocardiogram examinations, most                 recent 04/30/2019. CHF and Cardiomyopathy, Previous Myocardial  Infarction and CAD, Signs/Symptoms:Dizziness/Lightheadedness,                 Chest Pain and Dyspnea; Risk Factors:Hypertension, Dyslipidemia                 and Former Smoker. Patient has had intermittent chest pains with                 DOE.  He denies leg edema. He has had some dizziness.  Sonographer:    Salvadore Dom RVT, RDCS (AE), RDMS Referring Phys: [place]  1. There is no left ventricular apical thrombus (Definity was used). Left ventricular ejection fraction, by estimation, is 45 to 50%. The left ventricle has mildly decreased function. The left ventricle demonstrates regional wall motion abnormalities (see scoring diagram/findings for description). Left ventricular diastolic parameters are consistent with Grade I diastolic dysfunction (impaired relaxation).  2. Right ventricular systolic function is normal. The right ventricular size is mildly enlarged.  3. Left atrial size was mildly dilated.  4. The pericardial effusion is surrounding the apex.  5. The mitral valve is normal in structure. Trivial mitral valve regurgitation. No evidence of mitral stenosis.  6. The aortic valve is normal in structure. Aortic valve regurgitation is not visualized. No aortic stenosis is present.  7. There is a large abdominal aortic aneurysm with a maximum diameter of 6.3 cm. Unable to establish relationship to the renal arteries on this cardiac study.  8. The inferior vena cava is normal in size with greater than 50% respiratory variability, suggesting right atrial pressure of 3 mmHg. Comparison(s): Prior images reviewed side by side. The left ventricular hypertrophy has improved. The left ventricular wall motion unchanged. An adominal aortic aneurysm is identified. Discussed with [name]. Will order CT aortic angiography and refer to Vascular Surgery. FINDINGS  Left Ventricle: There is no left ventricular apical thrombus (Definity was used). Left ventricular ejection fraction, by estimation, is 45 to 50%. The left ventricle has mildly decreased function. The left ventricle demonstrates regional wall motion abnormalities. Definity contrast agent was given IV to delineate the left ventricular endocardial borders. The left ventricular  internal cavity size was normal in size. There is no left ventricular hypertrophy. Left ventricular diastolic parameters are consistent with Grade I diastolic dysfunction (impaired relaxation). Normal left ventricular filling pressure.  LV Wall Scoring: The entire apex is aneurysmal. Right Ventricle: The right ventricular size is mildly enlarged. No increase in right ventricular wall thickness. Right ventricular systolic function is normal. Left Atrium: Left atrial size was mildly dilated. Right Atrium: Right atrial size was normal in size. Pericardium: Trivial pericardial effusion is present. The pericardial effusion is surrounding the apex. Mitral Valve: The mitral valve is normal in structure. Trivial mitral valve regurgitation. No evidence of mitral valve stenosis. Tricuspid Valve: The tricuspid valve is normal in structure. Tricuspid valve regurgitation is not demonstrated. No evidence of tricuspid stenosis. Aortic Valve: The aortic valve is normal in structure. Aortic valve regurgitation is not visualized. No aortic stenosis is present. Aortic valve mean gradient measures 2.0 mmHg. Aortic valve peak gradient measures 4.6 mmHg. Aortic valve area, by VTI measures 2.96 cm. Pulmonic Valve: The pulmonic valve was normal in structure. Pulmonic valve regurgitation is not visualized. No evidence of pulmonic stenosis. Aorta: There is a large abdominal aortic aneurysm with a maximum diameter of 6.3 cm. Unable to establish relationship to the renal arteries on this cardiac study. The aortic root and ascending aorta are structurally normal, with no  evidence of dilitation. Venous: The inferior vena cava is normal in size with greater than 50% respiratory variability, suggesting right atrial pressure of 3 mmHg. IAS/Shunts: No atrial level shunt detected by color flow Doppler.  LEFT VENTRICLE PLAX 2D LVIDd:         4.98 cm   Diastology LVIDs:         3.14 cm   LV e' medial:    5.66 cm/s LV PW:         1.04 cm   LV E/e'  medial:  7.8 LV IVS:        0.98 cm   LV e' lateral:   4.46 cm/s LVOT diam:     2.20 cm   LV E/e' lateral: 9.9 LV SV:         48 LV SV Index:   22 LVOT Area:     3.80 cm  RIGHT VENTRICLE RV Basal diam:  4.30 cm RV S prime:     13.60 cm/s TAPSE (M-mode): 2.2 cm LEFT ATRIUM             Index        RIGHT ATRIUM           Index LA diam:        3.80 cm 1.74 cm/m   RA Area:     11.20 cm LA Vol (A2C):   49.1 ml 22.45 ml/m  RA Volume:   22.90 ml  10.47 ml/m LA Vol (A4C):   35.1 ml 16.05 ml/m LA Biplane Vol: 42.2 ml 19.29 ml/m  AORTIC VALVE                    PULMONIC VALVE AV Area (Vmax):    3.01 cm     PV Vmax:          1.27 m/s AV Area (Vmean):   2.92 cm     PV Peak grad:     6.5 mmHg AV Area (VTI):     2.96 cm     PR End Diast Vel: 4.84 msec AV Vmax:           107.00 cm/s AV Vmean:          68.200 cm/s AV VTI:            0.162 m AV Peak Grad:      4.6 mmHg AV Mean Grad:      2.0 mmHg LVOT Vmax:         84.60 cm/s LVOT Vmean:        52.300 cm/s LVOT VTI:          0.126 m LVOT/AV VTI ratio: 0.78  AORTA Ao Root diam: 3.60 cm Ao Asc diam:  3.20 cm Ao Arch diam: 2.6 cm MITRAL VALVE               TRICUSPID VALVE MV Area (PHT): 3.28 cm    TR Peak grad:   10.2 mmHg MV Decel Time: 231 msec    TR Vmax:        160.00 cm/s MV E velocity: 44.30 cm/s MV A velocity: 56.00 cm/s  SHUNTS MV E/A ratio:  0.79        Systemic VTI:  0.13 m                            Systemic Diam: 2.20 cm Dani Gobble Croitoru MD Electronically signed by Sanda Klein MD Signature Date/Time:  06/30/2021/12:26:52 PM    Final    US THYROID  Result Date: 06/24/2021 CLINICAL DATA:  Goiter. EXAM: THYROID ULTRASOUND TECHNIQUE: Ultrasound examination of the thyroid gland and adjacent soft tissues was performed. COMPARISON:  01/17/2014, 02/13/2014 FINDINGS: Parenchymal Echotexture: Moderately heterogeneous Isthmus: 0.9 cm ,previously 0.3 cm Right lobe: 7.2 x 3.4 x 3.1 cm ,previously 7.6 x 3.0 x 2.6 cm Left lobe: 6.7 x 2.5 x 3.0 cm ,previously 7.0 x 2.8 x 2.8  cm ________________________________________________________ Estimated total number of nodules >/= 1 cm: 0 Number of spongiform nodules >/=  2 cm not described below (TR1): 0 Number of mixed cystic and solid nodules >/= 1.5 cm not described below ([place]): 0 _________________________________________________________ Nodule # 1: Location: Isthmus; Inferior Maximum size: 1.1 cm; Other 2 dimensions: 1.1 x 0.7 cm Composition: mixed cystic and solid (1) Echogenicity: isoechoic (1) Shape: not taller-than-wide (0) Margins: smooth (0) Echogenic foci: none (0) ACR TI-RADS total points: 2. ACR TI-RADS risk category: TR2 (2 points). ACR TI-RADS recommendations: This nodule does NOT meet TI-RADS criteria for biopsy or dedicated follow-up. _________________________________________________________ Nodule # 2: Location: Right; Superior Maximum size: 1.3 cm; Other 2 dimensions: 1.0 x 0.7 cm Composition: solid/almost completely solid (2) Echogenicity: isoechoic (1) Shape: not taller-than-wide (0) Margins: smooth (0) Echogenic foci: none (0) ACR TI-RADS total points: 3. ACR TI-RADS risk category: TR3 (3 points). ACR TI-RADS recommendations: Given size (<1.4 cm) and appearance, this nodule does NOT meet TI-RADS criteria for biopsy or dedicated follow-up. _________________________________________________________ Nodule # 3: Location: Right; Superior Maximum size: 1.0 cm; Other 2 dimensions: 0.9 x 0.8 cm Composition: cystic/almost completely cystic (0) Echogenicity: anechoic (0) Shape: not taller-than-wide (0) Margins: smooth (0) Echogenic foci: none (0) ACR TI-RADS total points: 0. ACR TI-RADS risk category: TR1 (0-1 points). ACR TI-RADS recommendations: This nodule does NOT meet TI-RADS criteria for biopsy or dedicated follow-up. _________________________________________________________ Nodule # 5: Location: Right; Mid Maximum size: 2.5 cm; Other 2 dimensions: 1.8 x 1.4 cm Composition: mixed cystic and solid (1) Echogenicity: hypoechoic  (2) Shape: not taller-than-wide (0) Margins: smooth (0) Echogenic foci: none (0) ACR TI-RADS total points: 3. ACR TI-RADS risk category: TR3 (3 points). ACR TI-RADS recommendations: **Given size (>/= 2.5 cm) and appearance, fine needle aspiration of this mildly suspicious nodule should be considered based on TI-RADS criteria. _________________________________________________________ Nodule # 6 (previously labeled 2): Prior biopsy: Yes (02/13/2014) Location: Right; Inferior Maximum size: 4.1 cm; Other 2 dimensions: 3.2 x 3.1 cm, previously, 1.8 x 1.5 x 1.5 cm Composition: solid/almost completely solid (2) Echogenicity: isoechoic (1) Shape: not taller-than-wide (0) Margins: smooth (0) Echogenic foci: none (0) ACR TI-RADS total points: 3. ACR TI-RADS risk category:  TR3 (3 points). Significant change in size (>/= 20% in two dimensions and minimal increase of 2 mm): Yes Change in features: No Change in ACR TI-RADS risk category: No ACR TI-RADS recommendations: Recommend correlation with prior biopsy results. _________________________________________________________ Nodule # 7: Location: Left; Superior Maximum size: 1.3 cm; Other 2 dimensions: 1.0 x 1.5 cm Composition: solid/almost completely solid (2) Echogenicity: isoechoic (1) Shape: not taller-than-wide (0) Margins: smooth (0) Echogenic foci: none (0) ACR TI-RADS total points: 3. ACR TI-RADS risk category: TR3 (3 points). ACR TI-RADS recommendations: Given size (<1.4 cm) and appearance, this nodule does NOT meet TI-RADS criteria for biopsy or dedicated follow-up. _________________________________________________________ Nodule # 8 (previously labeled 2): Prior biopsy: No Location: Left; Mid Maximum size: 2.8 cm; Other 2 dimensions: 2.7 x 1.8 cm, previously, 2.4 x 1.7 x 1.6 cm Composition: mixed cystic and solid (  1) Echogenicity: isoechoic (1) Shape: not taller-than-wide (0) Margins: smooth (0) Echogenic foci: none (0) ACR TI-RADS total points: 2. ACR TI-RADS risk  category:  TR2 (2 points). Significant change in size (>/= 20% in two dimensions and minimal increase of 2 mm): Yes Change in features: No Change in ACR TI-RADS risk category: No ACR TI-RADS recommendations: This nodule does NOT meet TI-RADS criteria for biopsy or dedicated follow-up. _________________________________________________________ Nodule # 9 (previously labeled 3): Prior biopsy: Yes (02/13/2014) Location: Left; Inferior Maximum size: 1.5 cm; Other 2 dimensions: 1.4 x 1.0 cm, previously, 2.0 x 1.8 x 1.4 cm Composition: mixed cystic and solid (1) Echogenicity: isoechoic (1) Shape: not taller-than-wide (0) Margins: smooth (0) Echogenic foci: none (0) ACR TI-RADS total points: 2. ACR TI-RADS risk category:  TR2 (2 points). Significant change in size (>/= 20% in two dimensions and minimal increase of 2 mm): No Change in features: Yes Change in ACR TI-RADS risk category: Yes ACR TI-RADS recommendations: Correlate with prior biopsy results. _________________________________________________________ No cervical lymphadenopathy. IMPRESSION: 1. Multinodular goiter with interval development of multifocal bilateral thyroid nodules. 2. Interval development of solid cystic nodule in the right mid thyroid (labeled 5, 2.5 cm) which meets criteria (TI-RADS category 3) for tissue sampling. Recommend ultrasound-guided fine-needle aspiration. 3. Interval enlargement of previously biopsied right inferior solid thyroid nodule (labeled 6, 4.1 cm, previously labeled 2, 1.8 cm). Recommend correlation with prior biopsy results. 4. Interval decreased size of previously biopsied left inferior thyroid nodule (labeled 9, 1.5 cm, previously labeled 3, 2.0 cm). Recommend correlation with prior biopsy results. The above is in keeping with the ACR TI-RADS recommendations - J Am Coll Radiol 2017;14:587-595. Ruthann Cancer, MD [facility] Radiology Electronically Signed   By: Ruthann Cancer M.D.    On: 06/24/2021 09:19   CT Angio Chest/Abd/Pel for Dissection W and/or W/WO  Result Date: 07/08/2021 CLINICAL DATA:  Abdominal aortic aneurysm. EXAM: CT ANGIOGRAPHY CHEST, ABDOMEN AND PELVIS TECHNIQUE: Non-contrast CT of the chest was initially obtained. Multidetector CT imaging through the chest, abdomen and pelvis was performed using the standard protocol during bolus administration of intravenous contrast. Multiplanar reconstructed images and MIPs were obtained and reviewed to evaluate the vascular anatomy. RADIATION DOSE REDUCTION: This exam was performed according to the departmental dose-optimization program which includes automated exposure control, adjustment of the mA and/or kV according to patient size and/or use of iterative reconstruction technique. CONTRAST:  128m OMNIPAQUE IOHEXOL 350 MG/ML SOLN COMPARISON:  CT abdomen pelvis 04/05/2008. FINDINGS: CTA CHEST FINDINGS Cardiovascular: Atherosclerotic calcification of the aorta. No evidence of dissection. Coronary artery calcification. Heart size normal. Mediastinum/Nodes: Multiple low-attenuation lesions in the thyroid measure up to 1.9 x 2.3 cm on the left. No pathologically enlarged mediastinal, hilar or axillary lymph nodes. Esophagus is grossly unremarkable. Lungs/Pleura: Mild paraseptal emphysema. Scattered bibasilar scarring. Lungs are otherwise clear. No pleural fluid. Airway is unremarkable. Musculoskeletal: Degenerative changes in the spine. No worrisome lytic or sclerotic lesions. Review of the MIP images confirms the above findings. CTA ABDOMEN AND PELVIS FINDINGS VASCULAR Aorta: Atherosclerotic calcification of the aorta. There are 2 infrarenal aortic aneurysms. Aneurysm located immediately inferior to the superior mesenteric artery measures 4.7 cm (10/109) for a length of 6.5 cm. Fusiform aneurysm more distally within the infrarenal aorta measures up to 6.1 cm (10/117) with a length of approximately 8.7 cm. These are new from 04/05/2008.  No evidence of aortic dissection. Celiac: Slightly narrowed at the origin (6/132). Otherwise widely patent. SMA: Small saccular aneurysm off  the lateral aspect of the proximal SMA, 7 mm (6/145), otherwise widely patent. Renals: Slight ostial narrowing on the left (6/159). Otherwise patent. IMA: Patent. Inflow: Right common iliac artery measures up to 1.8 cm. Widely patent bilaterally. Veins: Poorly assessed due to lack of opacification. Review of the MIP images confirms the above findings. NON-VASCULAR Hepatobiliary: Low-attenuation lesions in the liver measure up to 2.0 cm in the left hepatic lobe and are likely cysts. Stones in the gallbladder. No biliary ductal dilatation. Pancreas: Negative. Spleen: Negative. Adrenals/Urinary Tract: Right adrenal gland is unremarkable. 1.5 cm nodule in the lateral limb left adrenal gland measures 11 Hounsfield units on precontrast imaging. No follow-up necessary. Right kidney is unremarkable. Tiny stone in the upper pole left kidney. Kidneys are otherwise unremarkable. Ureters are decompressed. Bladder is grossly unremarkable. Stomach/Bowel: Stomach, small bowel, appendix and colon are unremarkable. Lymphatic: No pathologically enlarged lymph nodes. Reproductive: Prostate is visualized. Bilateral testicular prostheses. Other: No free fluid.  Mesenteries and peritoneum are unremarkable. Musculoskeletal: Degenerative changes in the spine. Review of the MIP images confirms the above findings. IMPRESSION: 1. Two infrarenal aortic aneurysm is measuring up to 6.1 x 8.7 cm, new from 04/05/2008. Recommend referral to a vascular specialist. This recommendation follows ACR consensus guidelines: White Paper of the ACR Incidental Findings Committee II on Vascular Findings. J Am Coll Radiol 2013; 10:789-794. 2. Slight ostial narrowing involving the celiac trunk and left renal artery. 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 Borderline  aneurysmal right common iliac artery. 3. Cholelithiasis. 4. Left adrenal adenoma. 5. Tiny left renal stone. 6. Multiple bilateral thyroid nodules. Patient underwent thyroid ultrasound 06/23/2021, at which time biopsy was recommended (ref: J Am Coll Radiol. 2015 Feb;12(2): 143-50). 7. Aortic atherosclerosis (ICD10-I70.0). Coronary artery calcification. 8.  Emphysema (ICD10-J43.9). Electronically Signed   By: Lorin Picket M.D.   On: 07/08/2021 11:46      1. There is no left ventricular apical thrombus (Definity was used). Left  ventricular ejection fraction, by estimation, is 45 to 50%. The left  ventricle has mildly decreased function. The left ventricle demonstrates  regional wall motion abnormalities  (see scoring diagram/findings for description). Left ventricular diastolic  parameters are consistent with Grade I diastolic dysfunction (impaired  relaxation).   2. Right ventricular systolic function is normal. The right ventricular  size is mildly enlarged.   3. Left atrial size was mildly dilated.   4. The pericardial effusion is surrounding the apex.   5. The mitral valve is normal in structure. Trivial mitral valve  regurgitation. No evidence of mitral stenosis.   6. The aortic valve is normal in structure. Aortic valve regurgitation is  not visualized. No aortic stenosis is present.   7. There is a large abdominal aortic aneurysm with a maximum diameter of  6.3 cm. Unable to establish relationship to the renal arteries on this  cardiac study.   8. The inferior vena cava is normal in size with greater than 50%  respiratory variability, suggesting right atrial pressure of 3 mmHg.   Comparison(s): Prior images reviewed side by side. The left ventricular  hypertrophy has improved. The left ventricular wall motion unchanged. 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 Discussed with [name]. Will  order CT aortic angiography and refer  to Vascular Surgery.     IMPRESSION:  1.  Abdominal aortic aneurysm (AAA) without rupture, unspecified part ([place])   2. History of ST elevation myocardial infarction (STEMI):01/15/2015   3. CAD S/P  percutaneous coronary angioplasty   4. Ischemic cardiomyopathy   5. Hyperlipidemia LDL goal <70   6. Mixed hyperlipidemia   7. Gilbert's syndrome      ASSESSMENT AND PLAN: Cesar Woods is a 59 -year-old white male who presented to [facility] on the evening of 01/14/2015 with late presentation  anterolateral MI which most likely occurred the day prior to his presentation.  In the emergency room, he was pain-free. Cardiac catheterization revealed severe diffuse CAD with ulcerated plaque and thrombus and evidence for spontaneous distal LAD dissection.  He had a very difficult but successful PCI to his LAD, and ultimate insertion of 3 Synergy DES stents covering a total of 96 mm and extending from the ostium to the mid LAD.  He also had PTCA of his distal LAD dissection.  A echo Doppler showed an EF of 40-45% and apical akinesis.  He was without  anginal symptomatology until July 2019 when he was found to have very focal 85% in-stent restenosis in the LAD stent at the region of the diagonal vessel with occlusion of the diagonal.  He had excellent collateralization to this diagonal vessel.  He underwent successful cutting balloon.  His last catheterization was on November 09, 2017 which demonstrated patent LAD stents  and there was now both antegrade and extensive retrograde collateralization to the diagonal vessel.  Subsequently he was without recurrent angina.  He was transitioned from losartan to [facility].  He was treated with Eliquis due to apical thrombus which on his echo Doppler study in March 2021 had resolved.  EF was 35 to 40% with grade 1 diastolic dysfunction and there was severe akinesis of the apex and distal anteroseptal lateral wall.  He had developed increasing palpitations leading to a visit to the ER.  Laboratory was negative.  A  subsequent Holter monitor did not reveal any significant ectopy with predominant sinus rhythm with an average rate at 78, slowest heart rate at sinus bradycardia at 60 bpm and fastest heart rate being sinus tachycardia at 126 bpm.  He had been titrated on Entresto to 49/51 mg twice daily but due to low blood pressure this was reduced back to 24/26 mg twice a day.  He has had issues with atypical sharp chest pain and has had several ER evaluations and his chest pain was not felt to be ischemic in etiology with normal troponins.  He has a history of mild indirect bilirubin elevation most likely consistent with Guilbert syndrome.  At his last office visit I recommended a follow-up echo Doppler study on his current regimen.  His echo Doppler study performed on Jun 30, 2021 now shows EF slightly improved at 45 to 50% with an akinetic aneurysm aneurysmally dilated apex.  There was grade 1 diastolic dysfunction.  There was no significant valvular abnormality.  However, on that echo it was noted that he had a very large 6.3 cm abdominal aortic aneurysm.  Subsequent CT imaging has confirmed a large areas of measuring up to 6.1 x 8.7 cm for which he was referred to see Dr. Trula Slade.  He saw Dr. Trula Slade yesterday and is now scheduled to see Dr. Sammuel Hines at [facility] for consideration of endovascular repair will require dealing with all 4 visceral vessels rather than open surgery.  Previously undergone extensive abdominal radiation for his testicular cancer.  Presently, his blood pressure is stable.  He is not having anginal symptoms.  He continues to be on his medical regimen consisting of Entresto 24/26 mg twice a  day, carvedilol 25 mg twice a day and he is on DAPT with aspirin/Plavix.  He continues to be on atorvastatin 80 mg for hyperlipidemia.  He will be seeing Dr. Sammuel Hines at [facility] tomorrow further recommendations regarding timing of surgery will be made at that time.  I will see him in several months for follow-up Cardiologic  evaluation.    Troy Sine, MD, [facility]  07/15/2021 12:48 PM

## 2021-07-15 NOTE — Patient Instructions (Signed)
Medication Instructions:  The current medical regimen is effective;  continue present plan and medications.  *If you need a refill on your cardiac medications before your next appointment, please call your pharmacy*  Follow-Up: At [facility], you and your health needs are our priority.  As part of our continuing mission to provide you with exceptional heart care, we have created designated Provider Care Teams.  These Care Teams include your primary Cardiologist (physician) and Advanced Practice Providers (APPs -  Physician Assistants and Nurse Practitioners) who all work together to provide you with the care you need, when you need it.  We recommend signing up for the patient portal called "MyChart".  Sign up information is provided on this After Visit Summary.  MyChart is used to connect with patients for Virtual Visits (Telemedicine).  Patients are able to view lab/test results, encounter notes, upcoming appointments, etc.  Non-urgent messages can be sent to your provider as well.   To learn more about what you can do with MyChart, go to [email or web address].    Your next appointment:   3 month(s)  The format for your next appointment:   In Person  Provider:   [name], MD

## 2021-07-16 DIAGNOSIS — I714 Abdominal aortic aneurysm, without rupture, unspecified: Secondary | ICD-10-CM | POA: Diagnosis not present

## 2021-07-16 DIAGNOSIS — I716 Thoracoabdominal aortic aneurysm, without rupture, unspecified: Secondary | ICD-10-CM | POA: Diagnosis not present

## 2021-07-16 DIAGNOSIS — Z6828 Body mass index (BMI) 28.0-28.9, adult: Secondary | ICD-10-CM | POA: Diagnosis not present

## 2021-09-08 ENCOUNTER — Other Ambulatory Visit: Payer: Self-pay | Admitting: Cardiovascular Disease

## 2021-10-06 ENCOUNTER — Telehealth: Payer: Self-pay | Admitting: Cardiovascular Disease

## 2021-10-06 NOTE — Telephone Encounter (Signed)
Notes faxed to surgeon. This phone note will be removed from the preop pool. Richardson Dopp, PA-C  10/06/2021 4:38 PM

## 2021-10-06 NOTE — Telephone Encounter (Signed)
   Patient Name:  Cesar Woods  DOB:  1965/12/03  MRN:  [id number]   Primary Cardiologist: [name], MD  Chart reviewed as part of pre-operative protocol coverage.   Dental cleanings and simple dental extractions are considered low risk procedures per guidelines and generally do not require any specific cardiac clearance. It is also generally accepted that for simple extractions and dental cleanings, there is no need to interrupt blood thinner therapy.  SBE prophylaxis is not required for the patient from a cardiac standpoint.  Please note, the patient is scheduled for a vascular procedure at [facility] in the very near future.  Recommend getting clearance from vascular surgeon at [facility] prior to proceeding with dental work.  Please call with questions.  Richardson Dopp, PA-C 10/06/2021, 4:35 PM

## 2021-10-06 NOTE — Telephone Encounter (Signed)
   Pre-operative Risk Assessment    Patient Name: Cesar Woods  DOB: Oct 04, 1965 MRN: [id number]     Request for Surgical Clearance Dental Exam and Xrays and some probing    Procedure:    Date of Surgery:  Clearance 10-07-21                                  Surgeon:   Dr Annitta Jersey, DDS  Surgeon's Group or Practice Name:   Phone number:  [phone] Fax number:  [phone]   Type of Clearance Requested:   - Medical    Type of Anesthesia:  None    Additional requests/questions:    Signed, Glyn Ade   10/06/2021, 2:13 PM

## 2021-10-22 ENCOUNTER — Other Ambulatory Visit: Payer: Self-pay | Admitting: Cardiovascular Disease

## 2021-11-05 DIAGNOSIS — Z01810 Encounter for preprocedural cardiovascular examination: Secondary | ICD-10-CM | POA: Diagnosis not present

## 2021-11-05 DIAGNOSIS — I714 Abdominal aortic aneurysm, without rupture, unspecified: Secondary | ICD-10-CM | POA: Diagnosis not present

## 2021-11-05 DIAGNOSIS — Z01811 Encounter for preprocedural respiratory examination: Secondary | ICD-10-CM | POA: Diagnosis not present

## 2021-11-05 DIAGNOSIS — Z01818 Encounter for other preprocedural examination: Secondary | ICD-10-CM | POA: Diagnosis not present

## 2021-11-05 DIAGNOSIS — I6523 Occlusion and stenosis of bilateral carotid arteries: Secondary | ICD-10-CM | POA: Diagnosis not present

## 2021-11-05 DIAGNOSIS — Z87891 Personal history of nicotine dependence: Secondary | ICD-10-CM | POA: Diagnosis not present

## 2021-11-05 DIAGNOSIS — I252 Old myocardial infarction: Secondary | ICD-10-CM | POA: Diagnosis not present

## 2021-11-05 DIAGNOSIS — I5189 Other ill-defined heart diseases: Secondary | ICD-10-CM | POA: Diagnosis not present

## 2021-11-05 DIAGNOSIS — I2510 Atherosclerotic heart disease of native coronary artery without angina pectoris: Secondary | ICD-10-CM | POA: Diagnosis not present

## 2021-11-05 DIAGNOSIS — I716 Thoracoabdominal aortic aneurysm, without rupture, unspecified: Secondary | ICD-10-CM | POA: Diagnosis not present

## 2021-11-05 DIAGNOSIS — I10 Essential (primary) hypertension: Secondary | ICD-10-CM | POA: Diagnosis not present

## 2021-11-05 DIAGNOSIS — E785 Hyperlipidemia, unspecified: Secondary | ICD-10-CM | POA: Diagnosis not present

## 2021-11-24 DIAGNOSIS — I714 Abdominal aortic aneurysm, without rupture, unspecified: Secondary | ICD-10-CM | POA: Diagnosis not present

## 2021-11-24 DIAGNOSIS — I110 Hypertensive heart disease with heart failure: Secondary | ICD-10-CM | POA: Diagnosis not present

## 2021-11-24 DIAGNOSIS — Z955 Presence of coronary angioplasty implant and graft: Secondary | ICD-10-CM | POA: Diagnosis not present

## 2021-11-24 DIAGNOSIS — K219 Gastro-esophageal reflux disease without esophagitis: Secondary | ICD-10-CM | POA: Diagnosis not present

## 2021-11-24 DIAGNOSIS — Z7982 Long term (current) use of aspirin: Secondary | ICD-10-CM | POA: Diagnosis not present

## 2021-11-24 DIAGNOSIS — I10 Essential (primary) hypertension: Secondary | ICD-10-CM | POA: Diagnosis not present

## 2021-11-24 DIAGNOSIS — I716 Thoracoabdominal aortic aneurysm, without rupture, unspecified: Secondary | ICD-10-CM | POA: Diagnosis not present

## 2021-11-24 DIAGNOSIS — U071 COVID-19: Secondary | ICD-10-CM | POA: Diagnosis not present

## 2021-11-24 DIAGNOSIS — I252 Old myocardial infarction: Secondary | ICD-10-CM | POA: Diagnosis not present

## 2021-11-24 DIAGNOSIS — I509 Heart failure, unspecified: Secondary | ICD-10-CM | POA: Diagnosis not present

## 2021-11-24 DIAGNOSIS — Z5309 Procedure and treatment not carried out because of other contraindication: Secondary | ICD-10-CM | POA: Diagnosis not present

## 2021-11-24 DIAGNOSIS — I2510 Atherosclerotic heart disease of native coronary artery without angina pectoris: Secondary | ICD-10-CM | POA: Diagnosis not present

## 2021-12-21 ENCOUNTER — Ambulatory Visit (INDEPENDENT_AMBULATORY_CARE_PROVIDER_SITE_OTHER): Payer: BC Managed Care – PPO | Admitting: Family Medicine

## 2021-12-21 ENCOUNTER — Encounter (HOSPITAL_BASED_OUTPATIENT_CLINIC_OR_DEPARTMENT_OTHER): Payer: Self-pay | Admitting: Family Medicine

## 2021-12-21 VITALS — BP 120/83 | HR 81 | Temp 97.7°F | Ht 72.0 in | Wt 210.3 lb

## 2021-12-21 DIAGNOSIS — R058 Other specified cough: Secondary | ICD-10-CM | POA: Insufficient documentation

## 2021-12-21 MED ORDER — BENZONATATE 200 MG PO CAPS: 200.0000 mg | ORAL_CAPSULE | Freq: Three times a day (TID) | ORAL | 0 refills | Status: DC | PRN

## 2021-12-21 NOTE — Assessment & Plan Note (Signed)
Patient is a very pleasant 56 year old male presenting for evaluation of cough.  He reports that just under about 4 weeks ago, he began to have symptoms and tested positive for COVID-19.  He generally has recovered well from the acute illness, however continues to have intermittent cough.  He does feel over the last 1 to 2 days, he has had improvement in his cough.  He has been utilizing tea with honey at home.  He has been avoiding OTC medications generally due to his underlying medical conditions.  He is planning to have procedure next week related to aortic aneurysm.  It is primarily because of this that he has some concerns today and wanted to be sure that nothing else needed to be done related to his ongoing cough.  He has not had any recent fevers.  He generally has been feeling well and feels that most of her symptoms have resolved from recent COVID-19 infection. On exam, patient is in no acute distress, vital signs stable, normal pulse oximetry.  Cardiovascular Sam with regular rate and rhythm.  Lungs are clear to auscultation bilaterally. Feel that current symptoms are most likely related to post viral cough syndrome.  Discussed that exam is generally unremarkable/reassuring and do not suspect acute pulmonary process ongoing at this time.  We discussed possibility of chest x-ray, however patient declines at this time which is reasonable.  Prescription sent to pharmacy for [facility] to allow for patient to utilize as needed if desired.  He can also continue with honey to assist with cough control. Wished patient would like for his upcoming procedure.  He will plan to follow-up as needed

## 2021-12-21 NOTE — Patient Instructions (Signed)

## 2021-12-21 NOTE — Progress Notes (Signed)
    Procedures performed today:    None.  Independent interpretation of notes and tests performed by another provider:   None.  Brief History, Exam, Impression, and Recommendations:    BP 120/83   Pulse 81   Temp 97.7 F (36.5 C) (Oral)   Ht 6' (1.829 m)   Wt 210 lb 4.8 oz (95.4 kg)   SpO2 98%   BMI 28.52 kg/m   Post-viral cough syndrome Patient is a very pleasant 56 year old male presenting for evaluation of cough.  He reports that just under about 4 weeks ago, he began to have symptoms and tested positive for COVID-19.  He generally has recovered well from the acute illness, however continues to have intermittent cough.  He does feel over the last 1 to 2 days, he has had improvement in his cough.  He has been utilizing tea with honey at home.  He has been avoiding OTC medications generally due to his underlying medical conditions.  He is planning to have procedure next week related to aortic aneurysm.  It is primarily because of this that he has some concerns today and wanted to be sure that nothing else needed to be done related to his ongoing cough.  He has not had any recent fevers.  He generally has been feeling well and feels that most of her symptoms have resolved from recent COVID-19 infection. On exam, patient is in no acute distress, vital signs stable, normal pulse oximetry.  Cardiovascular Sam with regular rate and rhythm.  Lungs are clear to auscultation bilaterally. Feel that current symptoms are most likely related to post viral cough syndrome.  Discussed that exam is generally unremarkable/reassuring and do not suspect acute pulmonary process ongoing at this time.  We discussed possibility of chest x-ray, however patient declines at this time which is reasonable.  Prescription sent to pharmacy for [facility] to allow for patient to utilize as needed if desired.  He can also continue with honey to assist with cough control. Wished patient would like for his upcoming  procedure.  He will plan to follow-up as needed  Return if symptoms worsen or fail to improve.   ___________________________________________ Shaniqwa Horsman de [place], MD, ABFM, CAQSM [facility] [place]

## 2021-12-27 DIAGNOSIS — I2510 Atherosclerotic heart disease of native coronary artery without angina pectoris: Secondary | ICD-10-CM | POA: Diagnosis not present

## 2021-12-27 DIAGNOSIS — Z9079 Acquired absence of other genital organ(s): Secondary | ICD-10-CM | POA: Diagnosis not present

## 2021-12-27 DIAGNOSIS — J90 Pleural effusion, not elsewhere classified: Secondary | ICD-10-CM | POA: Diagnosis not present

## 2021-12-27 DIAGNOSIS — Z48812 Encounter for surgical aftercare following surgery on the circulatory system: Secondary | ICD-10-CM | POA: Diagnosis not present

## 2021-12-27 DIAGNOSIS — I252 Old myocardial infarction: Secondary | ICD-10-CM | POA: Diagnosis not present

## 2021-12-27 DIAGNOSIS — Z9889 Other specified postprocedural states: Secondary | ICD-10-CM | POA: Diagnosis not present

## 2021-12-27 DIAGNOSIS — Z91013 Allergy to seafood: Secondary | ICD-10-CM | POA: Diagnosis not present

## 2021-12-27 DIAGNOSIS — I716 Thoracoabdominal aortic aneurysm, without rupture, unspecified: Secondary | ICD-10-CM | POA: Diagnosis not present

## 2021-12-27 DIAGNOSIS — Z95828 Presence of other vascular implants and grafts: Secondary | ICD-10-CM | POA: Diagnosis not present

## 2021-12-27 DIAGNOSIS — I429 Cardiomyopathy, unspecified: Secondary | ICD-10-CM | POA: Diagnosis not present

## 2021-12-27 DIAGNOSIS — Z79899 Other long term (current) drug therapy: Secondary | ICD-10-CM | POA: Diagnosis not present

## 2021-12-27 DIAGNOSIS — G8918 Other acute postprocedural pain: Secondary | ICD-10-CM | POA: Diagnosis not present

## 2021-12-27 DIAGNOSIS — I10 Essential (primary) hypertension: Secondary | ICD-10-CM | POA: Diagnosis not present

## 2021-12-27 DIAGNOSIS — Z7982 Long term (current) use of aspirin: Secondary | ICD-10-CM | POA: Diagnosis not present

## 2021-12-27 DIAGNOSIS — K219 Gastro-esophageal reflux disease without esophagitis: Secondary | ICD-10-CM | POA: Diagnosis not present

## 2021-12-27 DIAGNOSIS — Z7902 Long term (current) use of antithrombotics/antiplatelets: Secondary | ICD-10-CM | POA: Diagnosis not present

## 2021-12-27 DIAGNOSIS — E785 Hyperlipidemia, unspecified: Secondary | ICD-10-CM | POA: Diagnosis not present

## 2021-12-27 DIAGNOSIS — Z880 Allergy status to penicillin: Secondary | ICD-10-CM | POA: Diagnosis not present

## 2021-12-27 DIAGNOSIS — Z91041 Radiographic dye allergy status: Secondary | ICD-10-CM | POA: Diagnosis not present

## 2021-12-27 DIAGNOSIS — Z006 Encounter for examination for normal comparison and control in clinical research program: Secondary | ICD-10-CM | POA: Diagnosis not present

## 2021-12-27 DIAGNOSIS — J9811 Atelectasis: Secondary | ICD-10-CM | POA: Diagnosis not present

## 2021-12-27 DIAGNOSIS — Z888 Allergy status to other drugs, medicaments and biological substances status: Secondary | ICD-10-CM | POA: Diagnosis not present

## 2021-12-28 DIAGNOSIS — I716 Thoracoabdominal aortic aneurysm, without rupture, unspecified: Secondary | ICD-10-CM | POA: Diagnosis not present

## 2021-12-28 DIAGNOSIS — Z006 Encounter for examination for normal comparison and control in clinical research program: Secondary | ICD-10-CM | POA: Diagnosis not present

## 2021-12-28 DIAGNOSIS — I719 Aortic aneurysm of unspecified site, without rupture: Secondary | ICD-10-CM | POA: Insufficient documentation

## 2021-12-29 DIAGNOSIS — Z006 Encounter for examination for normal comparison and control in clinical research program: Secondary | ICD-10-CM | POA: Diagnosis not present

## 2021-12-29 DIAGNOSIS — I716 Thoracoabdominal aortic aneurysm, without rupture, unspecified: Secondary | ICD-10-CM | POA: Diagnosis not present

## 2022-01-03 DIAGNOSIS — I2510 Atherosclerotic heart disease of native coronary artery without angina pectoris: Secondary | ICD-10-CM | POA: Diagnosis not present

## 2022-01-03 DIAGNOSIS — I429 Cardiomyopathy, unspecified: Secondary | ICD-10-CM | POA: Diagnosis not present

## 2022-01-03 DIAGNOSIS — I714 Abdominal aortic aneurysm, without rupture, unspecified: Secondary | ICD-10-CM | POA: Diagnosis not present

## 2022-01-03 DIAGNOSIS — T82330A Leakage of aortic (bifurcation) graft (replacement), initial encounter: Secondary | ICD-10-CM | POA: Diagnosis not present

## 2022-01-03 DIAGNOSIS — R0602 Shortness of breath: Secondary | ICD-10-CM | POA: Diagnosis not present

## 2022-01-03 DIAGNOSIS — Z923 Personal history of irradiation: Secondary | ICD-10-CM | POA: Diagnosis not present

## 2022-01-03 DIAGNOSIS — T82310D Breakdown (mechanical) of aortic (bifurcation) graft (replacement), subsequent encounter: Secondary | ICD-10-CM | POA: Diagnosis not present

## 2022-01-03 DIAGNOSIS — K769 Liver disease, unspecified: Secondary | ICD-10-CM | POA: Diagnosis not present

## 2022-01-03 DIAGNOSIS — R109 Unspecified abdominal pain: Secondary | ICD-10-CM | POA: Diagnosis not present

## 2022-01-03 DIAGNOSIS — I252 Old myocardial infarction: Secondary | ICD-10-CM | POA: Diagnosis not present

## 2022-01-03 DIAGNOSIS — R1012 Left upper quadrant pain: Secondary | ICD-10-CM | POA: Diagnosis not present

## 2022-01-03 DIAGNOSIS — Z9889 Other specified postprocedural states: Secondary | ICD-10-CM | POA: Diagnosis not present

## 2022-01-03 DIAGNOSIS — T82310A Breakdown (mechanical) of aortic (bifurcation) graft (replacement), initial encounter: Secondary | ICD-10-CM | POA: Diagnosis not present

## 2022-01-03 DIAGNOSIS — I10 Essential (primary) hypertension: Secondary | ICD-10-CM | POA: Diagnosis not present

## 2022-01-03 DIAGNOSIS — I728 Aneurysm of other specified arteries: Secondary | ICD-10-CM | POA: Diagnosis not present

## 2022-01-03 DIAGNOSIS — Z95828 Presence of other vascular implants and grafts: Secondary | ICD-10-CM | POA: Diagnosis not present

## 2022-01-03 DIAGNOSIS — R9431 Abnormal electrocardiogram [ECG] [EKG]: Secondary | ICD-10-CM | POA: Diagnosis not present

## 2022-01-03 DIAGNOSIS — I716 Thoracoabdominal aortic aneurysm, without rupture, unspecified: Secondary | ICD-10-CM | POA: Diagnosis not present

## 2022-01-03 DIAGNOSIS — K219 Gastro-esophageal reflux disease without esophagitis: Secondary | ICD-10-CM | POA: Diagnosis not present

## 2022-01-03 DIAGNOSIS — R112 Nausea with vomiting, unspecified: Secondary | ICD-10-CM | POA: Diagnosis not present

## 2022-01-03 DIAGNOSIS — Z8679 Personal history of other diseases of the circulatory system: Secondary | ICD-10-CM | POA: Insufficient documentation

## 2022-01-03 DIAGNOSIS — Z880 Allergy status to penicillin: Secondary | ICD-10-CM | POA: Diagnosis not present

## 2022-01-03 DIAGNOSIS — K8020 Calculus of gallbladder without cholecystitis without obstruction: Secondary | ICD-10-CM | POA: Diagnosis not present

## 2022-01-03 DIAGNOSIS — Z48812 Encounter for surgical aftercare following surgery on the circulatory system: Secondary | ICD-10-CM | POA: Diagnosis not present

## 2022-01-03 DIAGNOSIS — Z7902 Long term (current) use of antithrombotics/antiplatelets: Secondary | ICD-10-CM | POA: Diagnosis not present

## 2022-01-03 DIAGNOSIS — G8918 Other acute postprocedural pain: Secondary | ICD-10-CM | POA: Diagnosis not present

## 2022-01-03 DIAGNOSIS — Z87891 Personal history of nicotine dependence: Secondary | ICD-10-CM | POA: Diagnosis not present

## 2022-01-03 DIAGNOSIS — Z8547 Personal history of malignant neoplasm of testis: Secondary | ICD-10-CM | POA: Diagnosis not present

## 2022-01-03 DIAGNOSIS — E785 Hyperlipidemia, unspecified: Secondary | ICD-10-CM | POA: Diagnosis not present

## 2022-01-03 DIAGNOSIS — Z7982 Long term (current) use of aspirin: Secondary | ICD-10-CM | POA: Diagnosis not present

## 2022-01-03 DIAGNOSIS — Z955 Presence of coronary angioplasty implant and graft: Secondary | ICD-10-CM | POA: Diagnosis not present

## 2022-01-03 DIAGNOSIS — Z9079 Acquired absence of other genital organ(s): Secondary | ICD-10-CM | POA: Diagnosis not present

## 2022-01-03 DIAGNOSIS — J439 Emphysema, unspecified: Secondary | ICD-10-CM | POA: Diagnosis not present

## 2022-01-03 DIAGNOSIS — I723 Aneurysm of iliac artery: Secondary | ICD-10-CM | POA: Diagnosis not present

## 2022-01-06 ENCOUNTER — Encounter (HOSPITAL_BASED_OUTPATIENT_CLINIC_OR_DEPARTMENT_OTHER): Payer: Self-pay

## 2022-01-28 DIAGNOSIS — I716 Thoracoabdominal aortic aneurysm, without rupture, unspecified: Secondary | ICD-10-CM | POA: Diagnosis not present

## 2022-01-28 DIAGNOSIS — I2510 Atherosclerotic heart disease of native coronary artery without angina pectoris: Secondary | ICD-10-CM | POA: Diagnosis not present

## 2022-01-28 DIAGNOSIS — Z006 Encounter for examination for normal comparison and control in clinical research program: Secondary | ICD-10-CM | POA: Diagnosis not present

## 2022-01-28 DIAGNOSIS — E785 Hyperlipidemia, unspecified: Secondary | ICD-10-CM | POA: Diagnosis not present

## 2022-01-28 DIAGNOSIS — I10 Essential (primary) hypertension: Secondary | ICD-10-CM | POA: Diagnosis not present

## 2022-01-28 DIAGNOSIS — I429 Cardiomyopathy, unspecified: Secondary | ICD-10-CM | POA: Diagnosis not present

## 2022-01-31 ENCOUNTER — Other Ambulatory Visit: Payer: Self-pay | Admitting: Cardiovascular Disease

## 2022-02-26 DIAGNOSIS — R7989 Other specified abnormal findings of blood chemistry: Secondary | ICD-10-CM | POA: Diagnosis not present

## 2022-03-19 ENCOUNTER — Encounter (HOSPITAL_BASED_OUTPATIENT_CLINIC_OR_DEPARTMENT_OTHER): Payer: Self-pay

## 2022-03-25 DIAGNOSIS — Z8547 Personal history of malignant neoplasm of testis: Secondary | ICD-10-CM | POA: Diagnosis not present

## 2022-03-25 DIAGNOSIS — Z87891 Personal history of nicotine dependence: Secondary | ICD-10-CM | POA: Diagnosis not present

## 2022-03-25 DIAGNOSIS — Z79899 Other long term (current) drug therapy: Secondary | ICD-10-CM | POA: Diagnosis not present

## 2022-03-25 DIAGNOSIS — Z880 Allergy status to penicillin: Secondary | ICD-10-CM | POA: Diagnosis not present

## 2022-03-25 DIAGNOSIS — J984 Other disorders of lung: Secondary | ICD-10-CM | POA: Diagnosis not present

## 2022-03-25 DIAGNOSIS — R079 Chest pain, unspecified: Secondary | ICD-10-CM | POA: Diagnosis not present

## 2022-03-25 DIAGNOSIS — Z882 Allergy status to sulfonamides status: Secondary | ICD-10-CM | POA: Diagnosis not present

## 2022-03-25 DIAGNOSIS — I2510 Atherosclerotic heart disease of native coronary artery without angina pectoris: Secondary | ICD-10-CM | POA: Diagnosis not present

## 2022-03-25 DIAGNOSIS — I517 Cardiomegaly: Secondary | ICD-10-CM | POA: Diagnosis not present

## 2022-03-25 DIAGNOSIS — R0789 Other chest pain: Secondary | ICD-10-CM | POA: Diagnosis not present

## 2022-03-25 DIAGNOSIS — K8020 Calculus of gallbladder without cholecystitis without obstruction: Secondary | ICD-10-CM | POA: Diagnosis not present

## 2022-03-25 DIAGNOSIS — Z7901 Long term (current) use of anticoagulants: Secondary | ICD-10-CM | POA: Diagnosis not present

## 2022-03-25 DIAGNOSIS — I252 Old myocardial infarction: Secondary | ICD-10-CM | POA: Diagnosis not present

## 2022-03-25 DIAGNOSIS — Z7982 Long term (current) use of aspirin: Secondary | ICD-10-CM | POA: Diagnosis not present

## 2022-03-25 DIAGNOSIS — Z888 Allergy status to other drugs, medicaments and biological substances status: Secondary | ICD-10-CM | POA: Diagnosis not present

## 2022-03-25 DIAGNOSIS — J9811 Atelectasis: Secondary | ICD-10-CM | POA: Diagnosis not present

## 2022-03-25 DIAGNOSIS — Z7902 Long term (current) use of antithrombotics/antiplatelets: Secondary | ICD-10-CM | POA: Diagnosis not present

## 2022-03-25 DIAGNOSIS — I714 Abdominal aortic aneurysm, without rupture, unspecified: Secondary | ICD-10-CM | POA: Diagnosis not present

## 2022-03-26 DIAGNOSIS — I2510 Atherosclerotic heart disease of native coronary artery without angina pectoris: Secondary | ICD-10-CM | POA: Diagnosis not present

## 2022-03-26 DIAGNOSIS — R079 Chest pain, unspecified: Secondary | ICD-10-CM | POA: Diagnosis not present

## 2022-03-26 DIAGNOSIS — I517 Cardiomegaly: Secondary | ICD-10-CM | POA: Diagnosis not present

## 2022-03-26 DIAGNOSIS — J9811 Atelectasis: Secondary | ICD-10-CM | POA: Diagnosis not present

## 2022-04-08 DIAGNOSIS — Z006 Encounter for examination for normal comparison and control in clinical research program: Secondary | ICD-10-CM | POA: Diagnosis not present

## 2022-04-08 DIAGNOSIS — I10 Essential (primary) hypertension: Secondary | ICD-10-CM | POA: Diagnosis not present

## 2022-04-08 DIAGNOSIS — I716 Thoracoabdominal aortic aneurysm, without rupture, unspecified: Secondary | ICD-10-CM | POA: Diagnosis not present

## 2022-04-08 DIAGNOSIS — Z95828 Presence of other vascular implants and grafts: Secondary | ICD-10-CM | POA: Diagnosis not present

## 2022-04-08 DIAGNOSIS — Z87891 Personal history of nicotine dependence: Secondary | ICD-10-CM | POA: Diagnosis not present

## 2022-04-08 DIAGNOSIS — E785 Hyperlipidemia, unspecified: Secondary | ICD-10-CM | POA: Diagnosis not present

## 2022-04-08 DIAGNOSIS — I252 Old myocardial infarction: Secondary | ICD-10-CM | POA: Diagnosis not present

## 2022-04-08 DIAGNOSIS — I2510 Atherosclerotic heart disease of native coronary artery without angina pectoris: Secondary | ICD-10-CM | POA: Diagnosis not present

## 2022-04-18 IMAGING — DX DG CHEST 2V
2 series · 2 of 2 positions shown · non-contrast
Comparison: Chest x-ray 09/10/2019

CLINICAL DATA: Chest pain

EXAM:
CHEST - 2 VIEW

[chest pa]
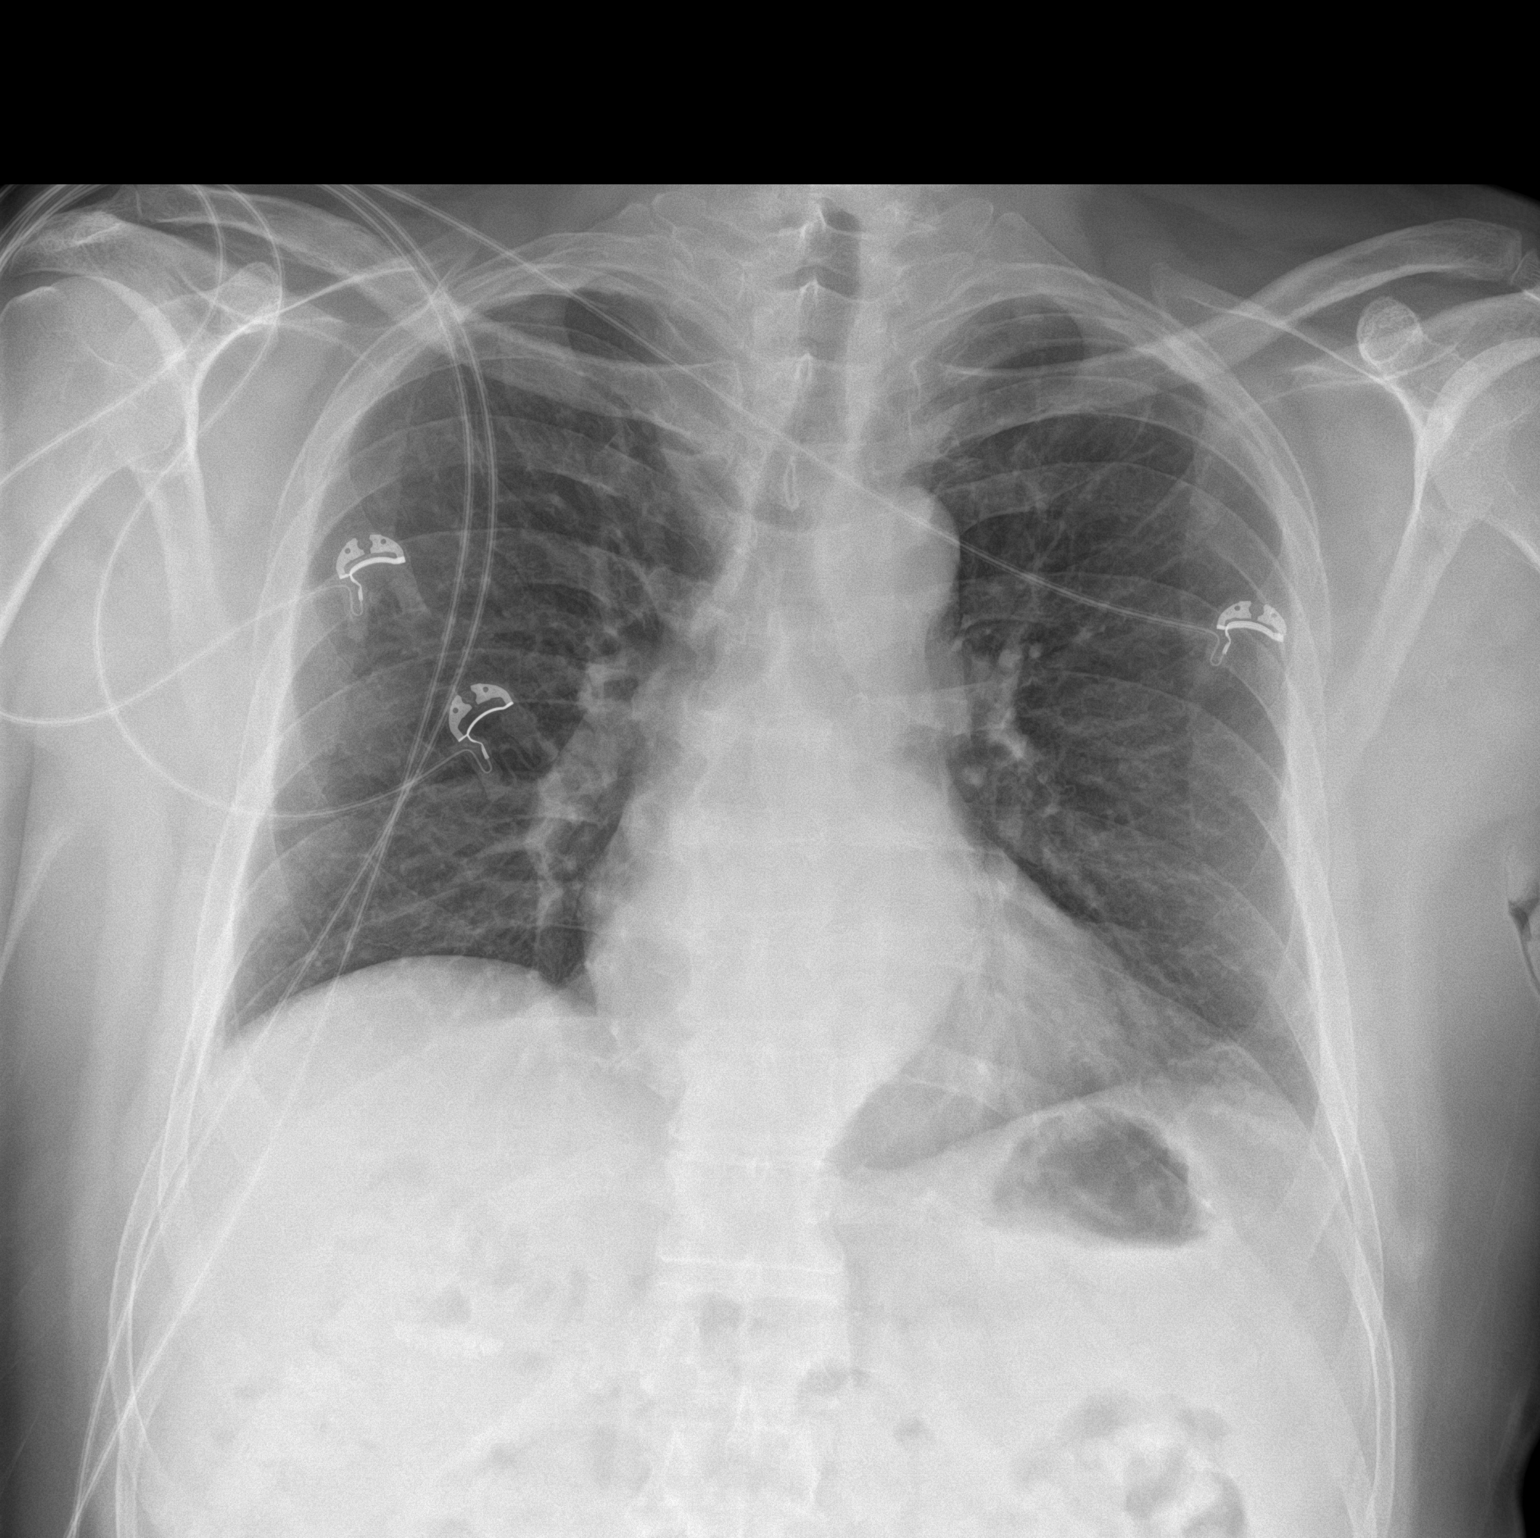

[chest lat]
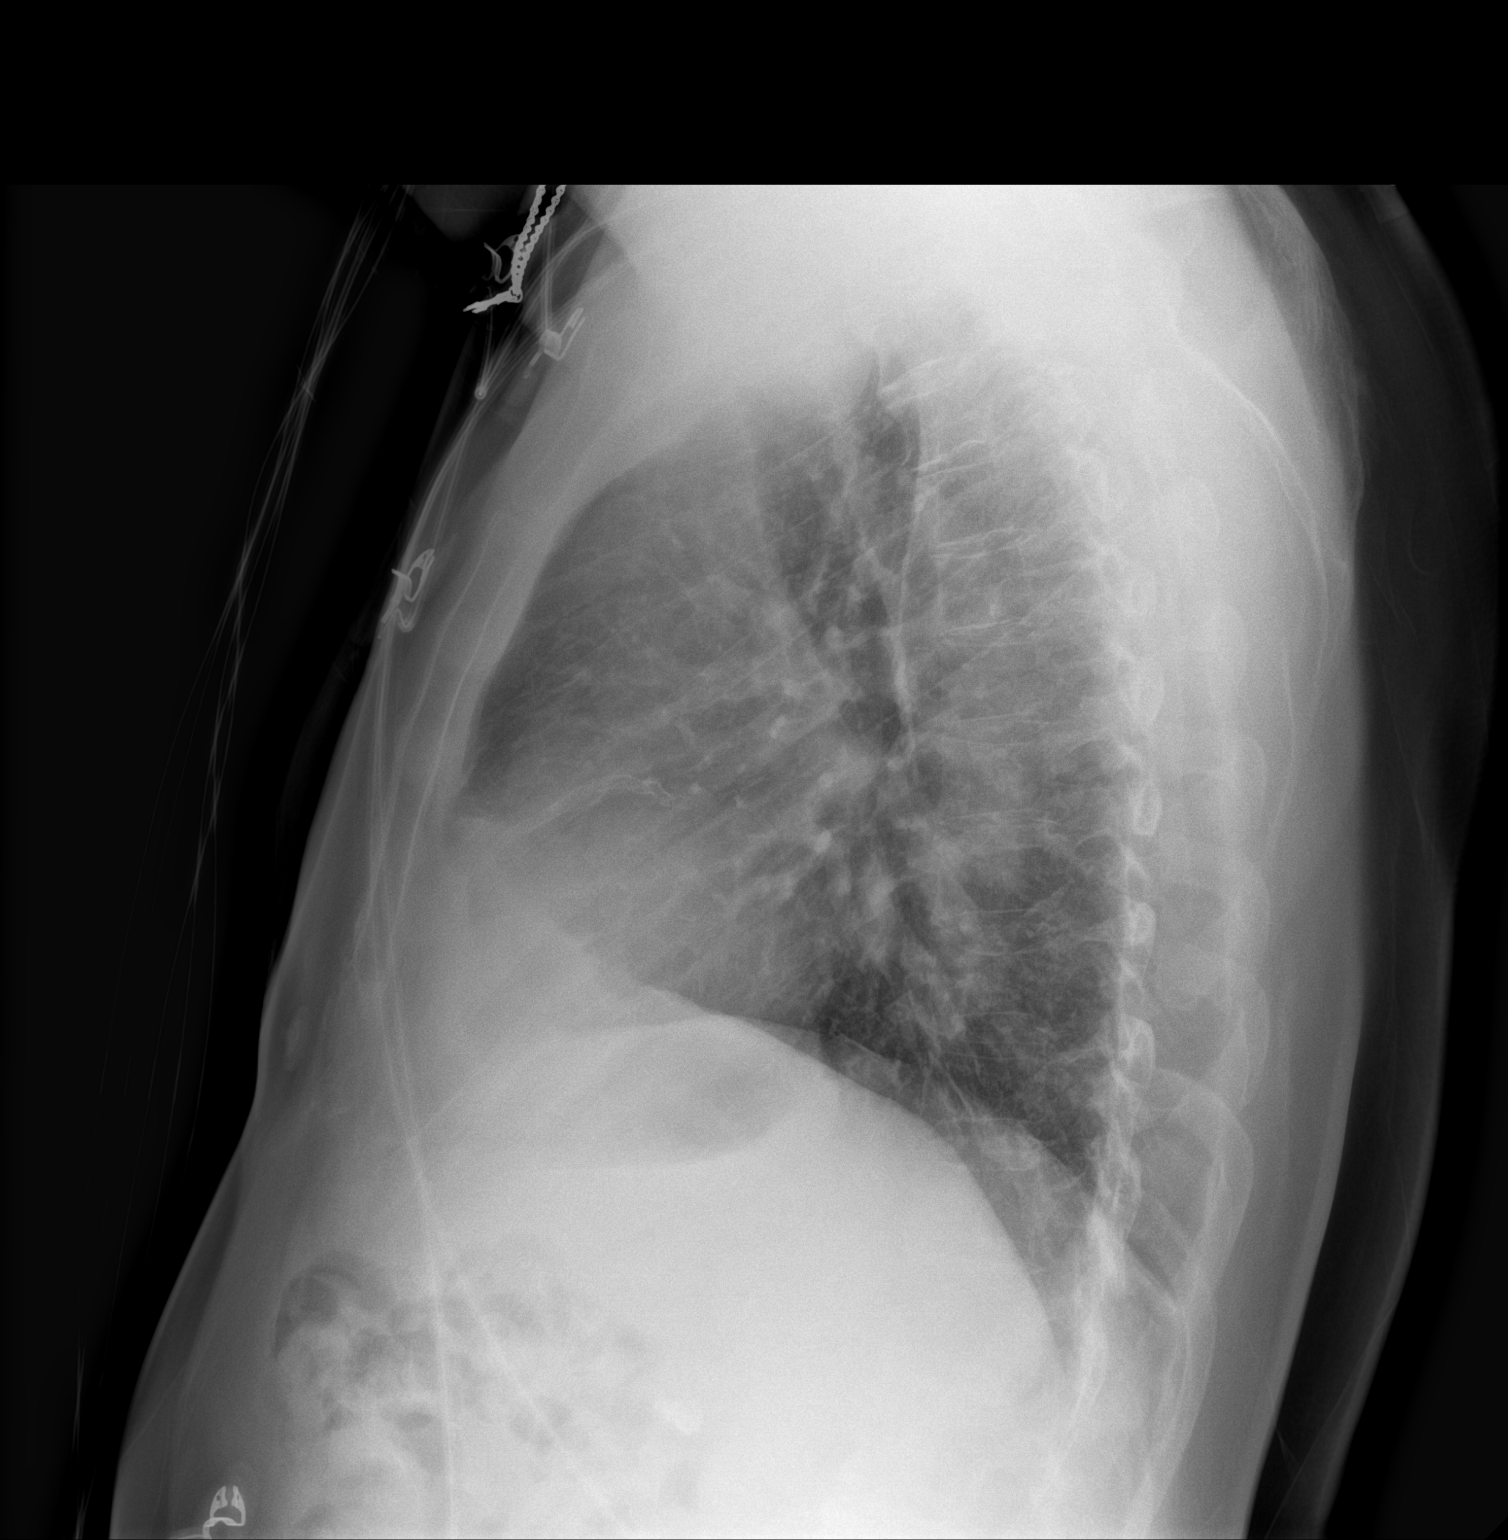

[2 of 2 positions shown; findings below may reference images not displayed]

FINDINGS: Heart size stable and within normal limits. Tortuous thoracic aorta.
No suspicious pulmonary opacities identified.

No pleural effusion or pneumothorax visualized.

No acute osseous abnormality appreciated.
IMPRESSION: No acute intrathoracic process identified.

## 2022-04-21 ENCOUNTER — Encounter (HOSPITAL_BASED_OUTPATIENT_CLINIC_OR_DEPARTMENT_OTHER): Payer: Self-pay

## 2022-05-03 ENCOUNTER — Other Ambulatory Visit: Payer: Self-pay | Admitting: Cardiovascular Disease

## 2022-06-25 DIAGNOSIS — R030 Elevated blood-pressure reading, without diagnosis of hypertension: Secondary | ICD-10-CM | POA: Diagnosis not present

## 2022-06-25 DIAGNOSIS — Z880 Allergy status to penicillin: Secondary | ICD-10-CM | POA: Diagnosis not present

## 2022-06-25 DIAGNOSIS — R079 Chest pain, unspecified: Secondary | ICD-10-CM | POA: Diagnosis not present

## 2022-06-25 DIAGNOSIS — Z87891 Personal history of nicotine dependence: Secondary | ICD-10-CM | POA: Diagnosis not present

## 2022-06-25 DIAGNOSIS — R7989 Other specified abnormal findings of blood chemistry: Secondary | ICD-10-CM | POA: Diagnosis not present

## 2022-06-25 DIAGNOSIS — R42 Dizziness and giddiness: Secondary | ICD-10-CM | POA: Diagnosis not present

## 2022-06-25 DIAGNOSIS — R9431 Abnormal electrocardiogram [ECG] [EKG]: Secondary | ICD-10-CM | POA: Diagnosis not present

## 2022-06-25 DIAGNOSIS — J9811 Atelectasis: Secondary | ICD-10-CM | POA: Diagnosis not present

## 2022-07-01 DIAGNOSIS — Z09 Encounter for follow-up examination after completed treatment for conditions other than malignant neoplasm: Secondary | ICD-10-CM | POA: Diagnosis not present

## 2022-07-01 DIAGNOSIS — K8020 Calculus of gallbladder without cholecystitis without obstruction: Secondary | ICD-10-CM | POA: Diagnosis not present

## 2022-07-01 DIAGNOSIS — Z91018 Allergy to other foods: Secondary | ICD-10-CM | POA: Diagnosis not present

## 2022-07-01 DIAGNOSIS — I10 Essential (primary) hypertension: Secondary | ICD-10-CM | POA: Diagnosis not present

## 2022-07-01 DIAGNOSIS — I716 Thoracoabdominal aortic aneurysm, without rupture, unspecified: Secondary | ICD-10-CM | POA: Diagnosis not present

## 2022-07-01 DIAGNOSIS — Z9889 Other specified postprocedural states: Secondary | ICD-10-CM | POA: Diagnosis not present

## 2022-07-01 DIAGNOSIS — Z880 Allergy status to penicillin: Secondary | ICD-10-CM | POA: Diagnosis not present

## 2022-07-01 DIAGNOSIS — K429 Umbilical hernia without obstruction or gangrene: Secondary | ICD-10-CM | POA: Diagnosis not present

## 2022-07-01 DIAGNOSIS — Z7902 Long term (current) use of antithrombotics/antiplatelets: Secondary | ICD-10-CM | POA: Diagnosis not present

## 2022-07-01 DIAGNOSIS — K5730 Diverticulosis of large intestine without perforation or abscess without bleeding: Secondary | ICD-10-CM | POA: Diagnosis not present

## 2022-07-01 DIAGNOSIS — Z91013 Allergy to seafood: Secondary | ICD-10-CM | POA: Diagnosis not present

## 2022-07-01 DIAGNOSIS — Z7982 Long term (current) use of aspirin: Secondary | ICD-10-CM | POA: Diagnosis not present

## 2022-07-01 DIAGNOSIS — I2510 Atherosclerotic heart disease of native coronary artery without angina pectoris: Secondary | ICD-10-CM | POA: Diagnosis not present

## 2022-07-01 DIAGNOSIS — R42 Dizziness and giddiness: Secondary | ICD-10-CM | POA: Diagnosis not present

## 2022-07-01 DIAGNOSIS — K219 Gastro-esophageal reflux disease without esophagitis: Secondary | ICD-10-CM | POA: Diagnosis not present

## 2022-07-01 DIAGNOSIS — E785 Hyperlipidemia, unspecified: Secondary | ICD-10-CM | POA: Diagnosis not present

## 2022-07-01 DIAGNOSIS — Z888 Allergy status to other drugs, medicaments and biological substances status: Secondary | ICD-10-CM | POA: Diagnosis not present

## 2022-07-01 DIAGNOSIS — G43909 Migraine, unspecified, not intractable, without status migrainosus: Secondary | ICD-10-CM | POA: Diagnosis not present

## 2022-07-01 DIAGNOSIS — Z79899 Other long term (current) drug therapy: Secondary | ICD-10-CM | POA: Diagnosis not present

## 2022-07-01 DIAGNOSIS — Z95828 Presence of other vascular implants and grafts: Secondary | ICD-10-CM | POA: Diagnosis not present

## 2022-07-01 DIAGNOSIS — Z87891 Personal history of nicotine dependence: Secondary | ICD-10-CM | POA: Diagnosis not present

## 2022-08-24 ENCOUNTER — Telehealth (HOSPITAL_BASED_OUTPATIENT_CLINIC_OR_DEPARTMENT_OTHER): Payer: Self-pay | Admitting: *Deleted

## 2022-08-24 ENCOUNTER — Encounter (HOSPITAL_BASED_OUTPATIENT_CLINIC_OR_DEPARTMENT_OTHER): Payer: Self-pay | Admitting: *Deleted

## 2022-08-24 NOTE — Telephone Encounter (Signed)
 LVM to offer colon cancer screening. Mychart message sent

## 2022-08-25 IMAGING — CT CT ANGIO CHEST-ABD-PELV FOR DISSECTION W/ AND WO/W CM
2 of 7 series · 14 of 46 positions shown, 16 images · IV contrast (APPLIED)
Comparison: CT abdomen pelvis 04/05/2008.

CLINICAL DATA: Abdominal aortic aneurysm.

EXAM:
CT ANGIOGRAPHY CHEST, ABDOMEN AND PELVIS
TECHNIQUE: Non-contrast CT of the chest was initially obtained.

[Series 6: arterial · axial · arterial · 0.86mm/px · z∈[+496,+1130]mm · 11 of 353 slices shown, 13 images]
[im 18/353  soft-tissue]
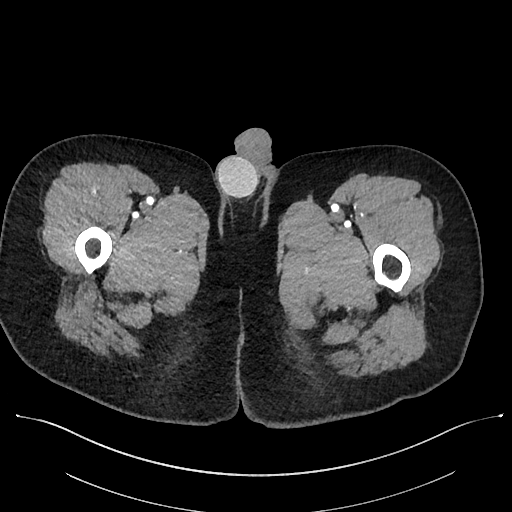
[im 18/353  bone]
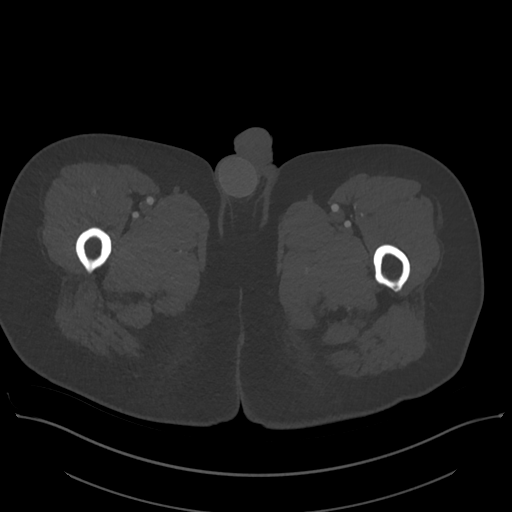
[im 53/353  soft-tissue]
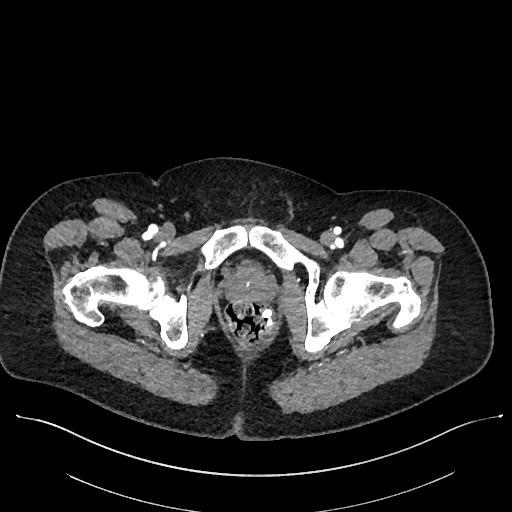
[im 89/353  soft-tissue]
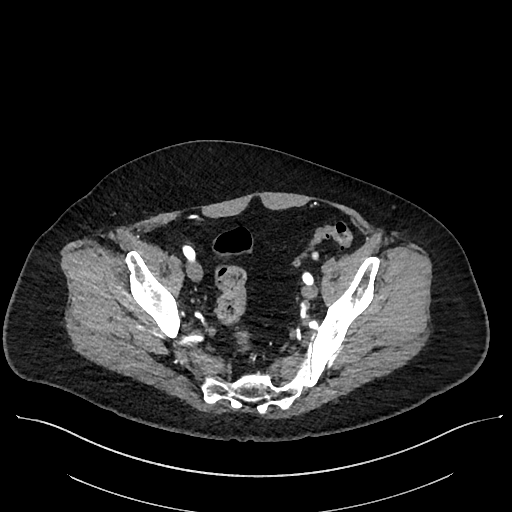
[im 124/353  soft-tissue]
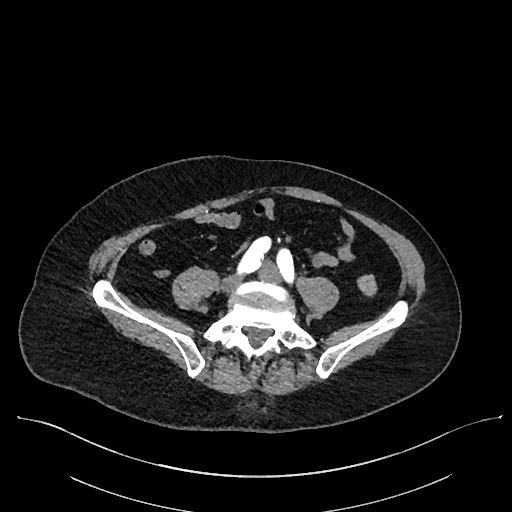
[im 141/353  soft-tissue]
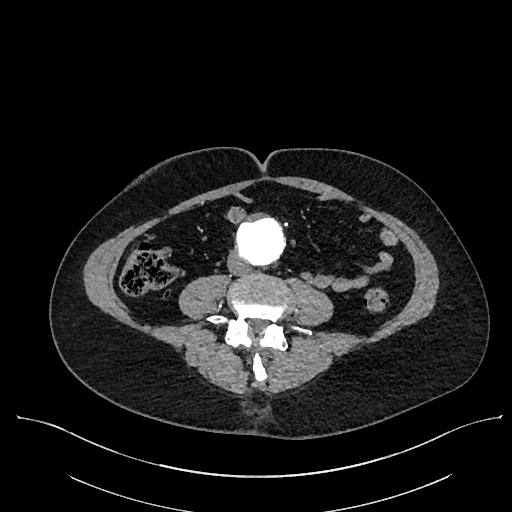
[im 177/353  soft-tissue]
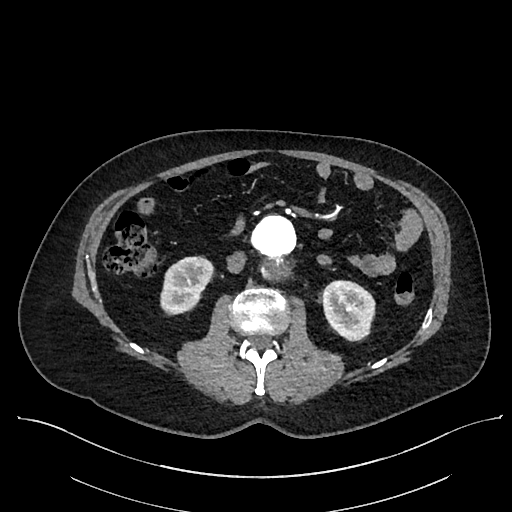
[im 212/353  soft-tissue]
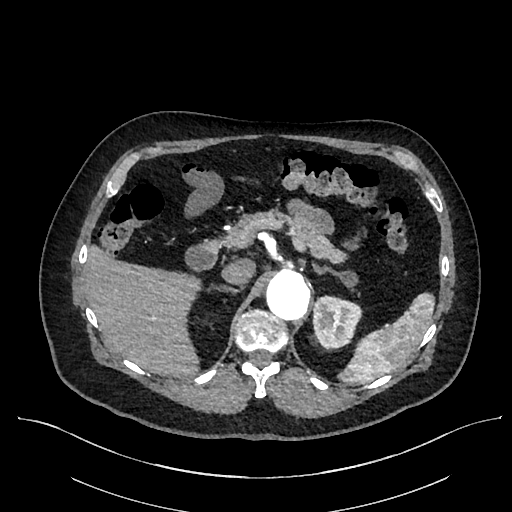
[im 229/353  soft-tissue]
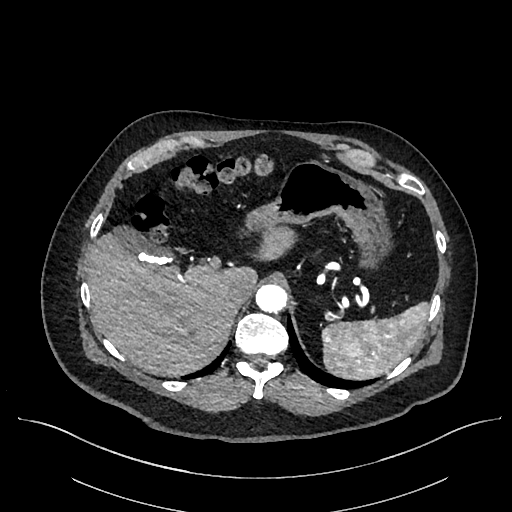
[im 265/353  soft-tissue]
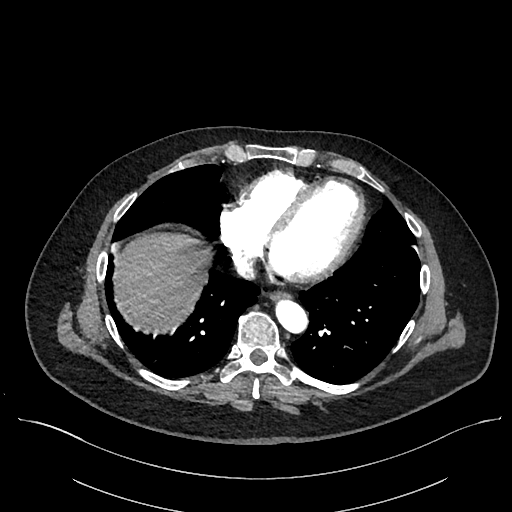
[im 265/353  bone]
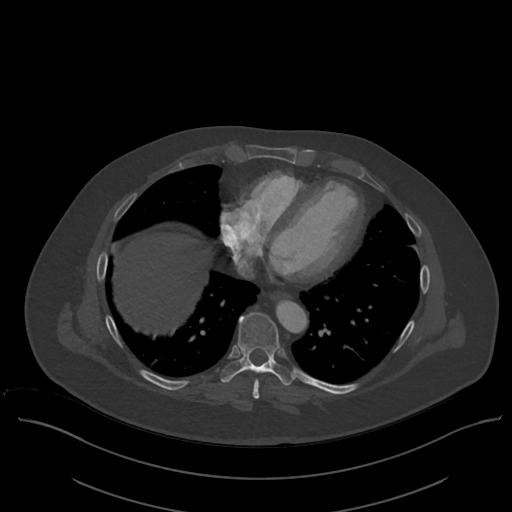
[im 300/353  soft-tissue]
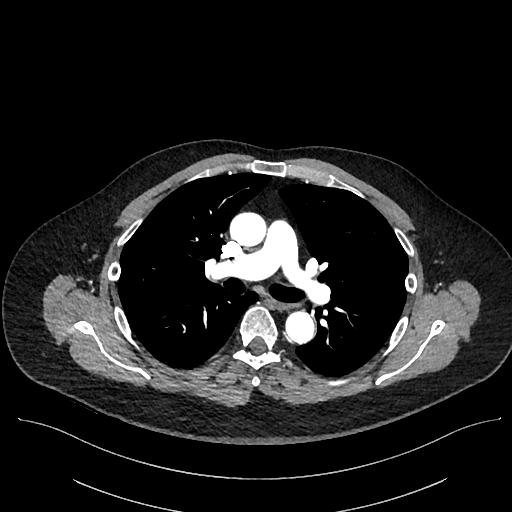
[im 335/353  soft-tissue]
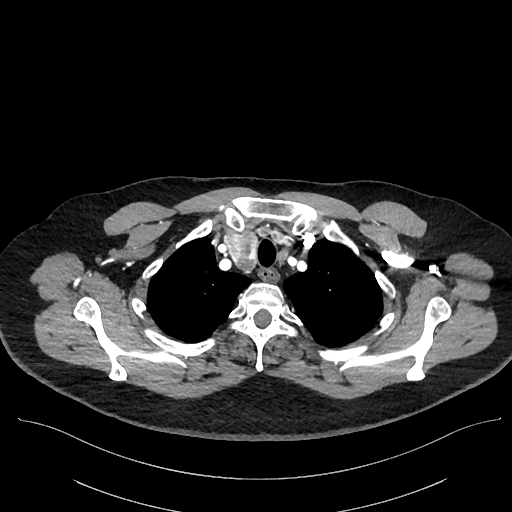

[Series 9: cor · coronal · 1.03mm/px · 3 of 151 slices shown]
[im 38/151  soft-tissue]
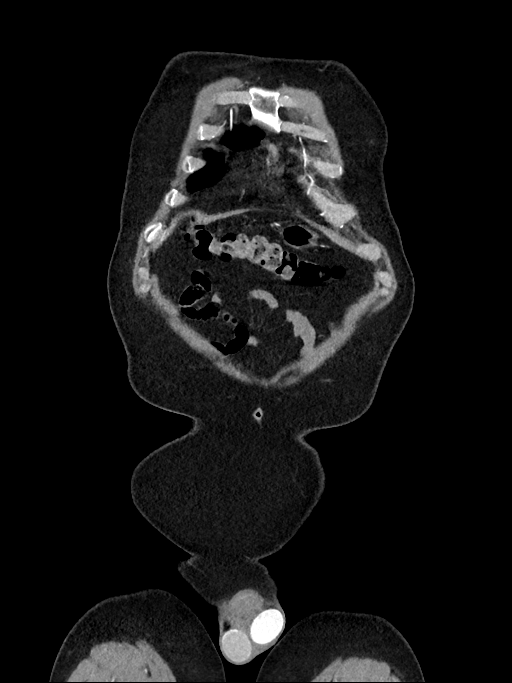
[im 76/151  soft-tissue]
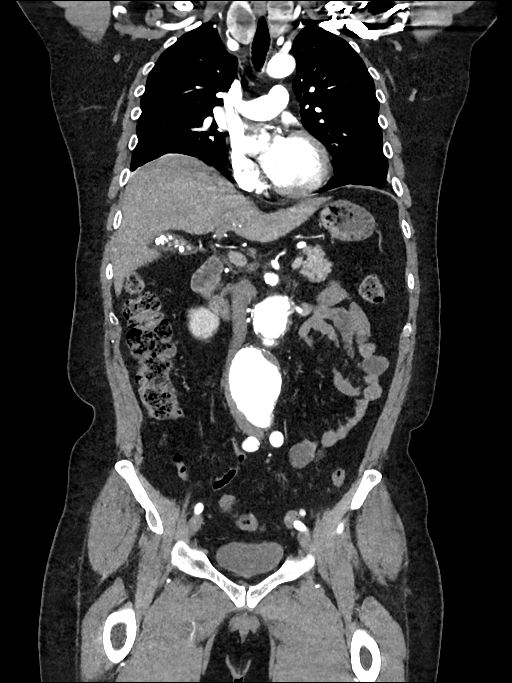
[im 113/151  soft-tissue]
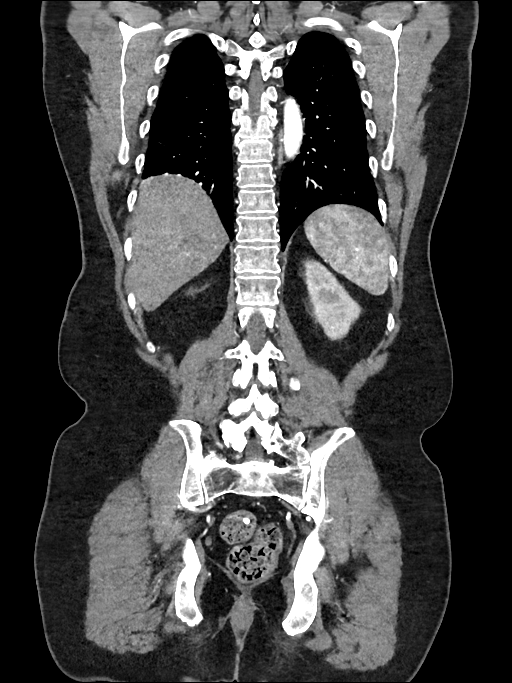

[14 of 46 positions shown; findings below may reference images not displayed]

Multidetector CT imaging through the chest, abdomen and pelvis was
performed using the standard protocol during bolus administration of
intravenous contrast. Multiplanar reconstructed images and MIPs were
obtained and reviewed to evaluate the vascular anatomy.

RADIATION DOSE REDUCTION: This exam was performed according to the
departmental dose-optimization program which includes automated
exposure control, adjustment of the mA and/or kV according to
patient size and/or use of iterative reconstruction technique.

CONTRAST:  100mL OMNIPAQUE IOHEXOL 350 MG/ML SOLN
FINDINGS: CTA CHEST FINDINGS

Cardiovascular: Atherosclerotic calcification of the aorta. No
evidence of dissection. Coronary artery calcification. Heart size
normal.

Mediastinum/Nodes: Multiple low-attenuation lesions in the thyroid
measure up to 1.9 x 2.3 cm on the left. No pathologically enlarged
mediastinal, hilar or axillary lymph nodes. Esophagus is grossly
unremarkable.

Lungs/Pleura: Mild paraseptal emphysema. Scattered bibasilar
scarring. Lungs are otherwise clear. No pleural fluid. Airway is
unremarkable.

Musculoskeletal: Degenerative changes in the spine. No worrisome
lytic or sclerotic lesions.

Review of the MIP images confirms the above findings.

CTA ABDOMEN AND PELVIS FINDINGS

VASCULAR

Aorta: Atherosclerotic calcification of the aorta. There are 2
infrarenal aortic aneurysms. Aneurysm located immediately inferior
to the superior mesenteric artery measures 4.7 cm (10/109) for a
length of 6.5 cm. Fusiform aneurysm more distally within the
infrarenal aorta measures up to 6.1 cm (10/117) with a length of
approximately 8.7 cm. These are new from 04/05/2008. No evidence of
aortic dissection.

Celiac: Slightly narrowed at the origin (6/132). Otherwise widely
patent.

SMA: Small saccular aneurysm off the lateral aspect of the proximal
SMA, 7 mm (6/145), otherwise widely patent.

Renals: Slight ostial narrowing on the left (6/159). Otherwise
patent.

IMA: Patent.

Inflow: Right common iliac artery measures up to 1.8 cm. Widely
patent bilaterally.

Veins: Poorly assessed due to lack of opacification.

Review of the MIP images confirms the above findings.

NON-VASCULAR

Hepatobiliary: Low-attenuation lesions in the liver measure up to
2.0 cm in the left hepatic lobe and are likely cysts. Stones in the
gallbladder. No biliary ductal dilatation.

Pancreas: Negative.

Spleen: Negative.

Adrenals/Urinary Tract: Right adrenal gland is unremarkable. 1.5 cm
nodule in the lateral limb left adrenal gland measures 11 Hounsfield
units on precontrast imaging. No follow-up necessary. Right kidney
is unremarkable. Tiny stone in the upper pole left kidney. Kidneys
are otherwise unremarkable. Ureters are decompressed. Bladder is
grossly unremarkable.

Stomach/Bowel: Stomach, small bowel, appendix and colon are
unremarkable.

Lymphatic: No pathologically enlarged lymph nodes.

Reproductive: Prostate is visualized. Bilateral testicular
prostheses.

Other: No free fluid.  Mesenteries and peritoneum are unremarkable.

Musculoskeletal: Degenerative changes in the spine.

Review of the MIP images confirms the above findings.
IMPRESSION: 1. Two infrarenal aortic aneurysm is measuring up to 6.1 x 8.7 cm,
new from 04/05/2008. Recommend referral to a vascular specialist.
This recommendation follows ACR consensus guidelines: White Paper of
the ACR Incidental Findings Committee II on Vascular Findings. [HOSPITAL] 2685; [DATE].
2. Slight ostial narrowing involving the celiac trunk and left renal
artery. Small saccular aneurysm off the lateral aspect of the
proximal superior mesenteric artery. Borderline aneurysmal right
common iliac artery.
3. Cholelithiasis.
4. Left adrenal adenoma.
5. Tiny left renal stone.
6. Multiple bilateral thyroid nodules. Patient underwent thyroid
ultrasound 06/23/2021, at which time biopsy was recommended (ref: [HOSPITAL]. [DATE]): 143-50).
7. Aortic atherosclerosis ([id number]). Coronary artery
calcification.
8.  Emphysema ([id number]).

## 2022-08-29 ENCOUNTER — Other Ambulatory Visit: Payer: Self-pay | Admitting: Cardiovascular Disease

## 2022-09-27 ENCOUNTER — Other Ambulatory Visit: Payer: Self-pay | Admitting: Cardiovascular Disease

## 2022-10-21 DIAGNOSIS — Z006 Encounter for examination for normal comparison and control in clinical research program: Secondary | ICD-10-CM | POA: Diagnosis not present

## 2022-10-21 DIAGNOSIS — K219 Gastro-esophageal reflux disease without esophagitis: Secondary | ICD-10-CM | POA: Diagnosis not present

## 2022-10-21 DIAGNOSIS — Z880 Allergy status to penicillin: Secondary | ICD-10-CM | POA: Diagnosis not present

## 2022-10-21 DIAGNOSIS — Z7982 Long term (current) use of aspirin: Secondary | ICD-10-CM | POA: Diagnosis not present

## 2022-10-21 DIAGNOSIS — I716 Thoracoabdominal aortic aneurysm, without rupture, unspecified: Secondary | ICD-10-CM | POA: Diagnosis not present

## 2022-10-21 DIAGNOSIS — I2510 Atherosclerotic heart disease of native coronary artery without angina pectoris: Secondary | ICD-10-CM | POA: Diagnosis not present

## 2022-10-21 DIAGNOSIS — Z79899 Other long term (current) drug therapy: Secondary | ICD-10-CM | POA: Diagnosis not present

## 2022-10-21 DIAGNOSIS — Z95828 Presence of other vascular implants and grafts: Secondary | ICD-10-CM | POA: Diagnosis not present

## 2022-10-21 DIAGNOSIS — Z87891 Personal history of nicotine dependence: Secondary | ICD-10-CM | POA: Diagnosis not present

## 2022-10-21 DIAGNOSIS — I10 Essential (primary) hypertension: Secondary | ICD-10-CM | POA: Diagnosis not present

## 2022-10-21 DIAGNOSIS — I429 Cardiomyopathy, unspecified: Secondary | ICD-10-CM | POA: Diagnosis not present

## 2022-10-21 DIAGNOSIS — E785 Hyperlipidemia, unspecified: Secondary | ICD-10-CM | POA: Diagnosis not present

## 2022-10-22 ENCOUNTER — Other Ambulatory Visit: Payer: Self-pay | Admitting: Cardiovascular Disease

## 2022-11-12 ENCOUNTER — Other Ambulatory Visit: Payer: Self-pay | Admitting: Family Medicine

## 2022-11-12 DIAGNOSIS — Z1212 Encounter for screening for malignant neoplasm of rectum: Secondary | ICD-10-CM

## 2022-11-12 DIAGNOSIS — Z1211 Encounter for screening for malignant neoplasm of colon: Secondary | ICD-10-CM

## 2022-11-29 ENCOUNTER — Other Ambulatory Visit: Payer: Self-pay | Admitting: Cardiovascular Disease

## 2022-12-26 ENCOUNTER — Other Ambulatory Visit: Payer: Self-pay | Admitting: Cardiovascular Disease

## 2023-01-11 ENCOUNTER — Telehealth: Payer: Self-pay | Admitting: Cardiology

## 2023-01-11 NOTE — Telephone Encounter (Signed)
*  STAT* If patient is at the pharmacy, call can be transferred to refill team.   1. Which medications need to be refilled? (please list name of each medication and dose if known)   clopidogrel (PLAVIX) 75 MG tablet   2. Which pharmacy/location (including street and city if local pharmacy) is medication to be sent to? [id number] - [place], [place] - [address] AT [facility] [address] Phone: [phone]  Fax: [phone]     3. Do they need a 30 day or 90 day supply? 90 Pt has an appt on 01/18/23

## 2023-01-12 ENCOUNTER — Other Ambulatory Visit: Payer: Self-pay

## 2023-01-12 ENCOUNTER — Other Ambulatory Visit: Payer: Self-pay | Admitting: Cardiology

## 2023-01-12 MED ORDER — CLOPIDOGREL BISULFATE 75 MG PO TABS: 75.0000 mg | ORAL_TABLET | Freq: Every day | ORAL | 0 refills | Status: DC

## 2023-01-13 DIAGNOSIS — Z006 Encounter for examination for normal comparison and control in clinical research program: Secondary | ICD-10-CM | POA: Diagnosis not present

## 2023-01-13 DIAGNOSIS — E049 Nontoxic goiter, unspecified: Secondary | ICD-10-CM | POA: Diagnosis not present

## 2023-01-13 DIAGNOSIS — I10 Essential (primary) hypertension: Secondary | ICD-10-CM | POA: Diagnosis not present

## 2023-01-13 DIAGNOSIS — E785 Hyperlipidemia, unspecified: Secondary | ICD-10-CM | POA: Diagnosis not present

## 2023-01-13 DIAGNOSIS — I716 Thoracoabdominal aortic aneurysm, without rupture, unspecified: Secondary | ICD-10-CM | POA: Diagnosis not present

## 2023-01-13 DIAGNOSIS — K219 Gastro-esophageal reflux disease without esophagitis: Secondary | ICD-10-CM | POA: Diagnosis not present

## 2023-01-13 DIAGNOSIS — Z95828 Presence of other vascular implants and grafts: Secondary | ICD-10-CM | POA: Diagnosis not present

## 2023-01-13 DIAGNOSIS — I714 Abdominal aortic aneurysm, without rupture, unspecified: Secondary | ICD-10-CM | POA: Diagnosis not present

## 2023-01-13 DIAGNOSIS — I2510 Atherosclerotic heart disease of native coronary artery without angina pectoris: Secondary | ICD-10-CM | POA: Diagnosis not present

## 2023-01-14 NOTE — Telephone Encounter (Signed)
*  STAT* If patient is at the pharmacy, call can be transferred to refill team.   1. Which medications need to be refilled? (please list name of each medication and dose if known) Clopidogrel   2. Would you like to learn more about the convenience, safety, & potential cost savings by using the [facility]?   3. Are you open to using the [facility] (Type [facility].    4. Which pharmacy/location (including street and city if local pharmacy) is medication to be sent to?[facility] [address], [place], [place]   5. Do they need a 30 day or 90 day supply? 90 days and refills- please call today- completely out of medicine

## 2023-01-18 ENCOUNTER — Ambulatory Visit: Payer: BC Managed Care – PPO | Attending: Cardiovascular Disease | Admitting: Cardiovascular Disease

## 2023-01-18 ENCOUNTER — Encounter: Payer: Self-pay | Admitting: Cardiovascular Disease

## 2023-01-18 DIAGNOSIS — I110 Hypertensive heart disease with heart failure: Secondary | ICD-10-CM

## 2023-01-18 DIAGNOSIS — I255 Ischemic cardiomyopathy: Secondary | ICD-10-CM | POA: Diagnosis not present

## 2023-01-18 DIAGNOSIS — I252 Old myocardial infarction: Secondary | ICD-10-CM | POA: Diagnosis not present

## 2023-01-18 DIAGNOSIS — I2510 Atherosclerotic heart disease of native coronary artery without angina pectoris: Secondary | ICD-10-CM

## 2023-01-18 DIAGNOSIS — I714 Abdominal aortic aneurysm, without rupture, unspecified: Secondary | ICD-10-CM

## 2023-01-18 DIAGNOSIS — I5042 Chronic combined systolic (congestive) and diastolic (congestive) heart failure: Secondary | ICD-10-CM

## 2023-01-18 DIAGNOSIS — K8020 Calculus of gallbladder without cholecystitis without obstruction: Secondary | ICD-10-CM

## 2023-01-18 DIAGNOSIS — Z9861 Coronary angioplasty status: Secondary | ICD-10-CM

## 2023-01-18 NOTE — Progress Notes (Signed)
Patient ID: [name], male   DOB: [birth year], 57 y.o.   MRN: [id number]       Primary MD: Dr. Almedia Balls  HPI: DARRALL MATA is a 57 y.o. male who presents to the office today for an 8 month follow-up cardiology evaluation.  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  In December, he developed substernal chest discomfort which he initially attributed to GERD.  This continued to recur over that day.  The following day he went to work and continued to experience vague discomfort.  He was seen by his primary physician, Dr. Jenita Seashore in his ECG was abnormal.  Changes of an anterolateral MI.  He presented to the emergency room.  He was not having any chest pain or shortness of breath or palpitations.  He underwent cardiac catheterization the following morning on 01/15/2015 which revealed significant CAD with 90% ostial LAD stenosis followed by 99% proximal LAD stenosis in the region of the first septal perforating artery with significant thrombus.  There was diffuse 50% stenosis followed by 95% ulcerated plaque in the proximal to mid LAD.  There also appeared to be an apical LAD dissection.  40% ramus intermediate, like high marginal stenosis and a large dominant RCA with 30% mid stenosis.  Large PDA and PLA vessels with septal collateralization to the proximal LAD.  He was felt to have an out of hospital late presentation anterolateral infarction secondary to his multiple high-grade stenoses.  He underwent a very difficult but successful intervention to his diffusely diseased LAD.  There was evidence for a spiral dissection in the mid distal LAD but ultimately was insertion of 3 tandem synergy DES stents (0.538, 3.038, and 3.020 mm from the mid LAD to the ostium and PTCA of the distal LAD with low-level inflation tach up his apical initial dissection.  There was total occlusion of the diagonal  vessel antegrade but there was significant development of retrograde collaterals to this diagonal vessel from the distal LAD.  Subsequent, he stabilized well.  He was seen in follow-up by Huey Bienenstock on 01/24/2015 and was doing well without chest pain or shortness of breath.  At that time his blood pressure was controlled.  He participated in the cardiac rehabilitation program.  He is back at work and has a stressful job in Education officer, environmental for [employer].   When I saw him for follow-up evaluation in March 2017.  His ECG showed Q waves in V1 through V4 4 with persistent T-wave inversion anteriorly and anterolaterally.  I recommended that he discontinue metoprolol succinate and changed him to carvedilol.  He is now on carvedilol at 6.25 mg twice a day and is tolerating this well.  He continues to be on low-dose losartan 12.5 mg.  I scheduled him for follow-up echo Doppler study to reassess his LV function.  This was done on 05/30/2015.  Ejection fraction was now 40-45% and there was evidence for akinesis of the apical myocardium.  There was grade 1 diastolic dysfunction.  When I saw him in May 2017 for follow-up evaluation, he denied recurrent chest pain, PND or orthopnea.  At time, I slowly titrated ARB therapy and increase losartan to 25 mg at bedtime.  So increased his carvedilol to 9.375 mg twice a day for 2 weeks and further titrated this to 12.5 mg daily.   He developed a recurrent episode of chest pain in October 2017 and  was hospitalized.  A nuclear perfusion study was performed which showed his previous large fixed defect involving the apex extending towards the mid ventricle in the anterior and lateral wall.  There is very minimal if any peri-infarct ischemia with an EF of 43%.  These were felt to be consistent with his prior MI.    Since I  saw him in March 2018 he had been evaluated in the emergency room with intermittent chest and back pain.  He had undergone a stress Myoview study in January  2019 which showed an EF of 40% with a large defect consistent with his prior LAD infarction and there was no significant ischemia except mild peri-infarction ischemia.  In July 2019 he developed recurrent symptomatology and when I was on vacation and underwent repeat catheterization by Dr. Herbie Baltimore.  He was found to have 85% narrowing in the midportion of his long LAD stent in the region of the diagonal takeoff.  The diagonal vessel was occluded but had excellent collaterals.  He was rehospitalized in October 2019 with recurrent chest pain.  At that time on November 09, 2017 I performed repeat cardiac catheterization.  This revealed a widely patent LAD stent with mild residual narrowing of 20% at the site of recent in-stent restenosis that was treated with Cutting Balloon in July 2019.  The diagonal branch which arose from this segment has both antegrade filling and extensive retrograde collateralization being filled from the apical LAD.  The left circumflex marginal vessel had 30 and 35% proximal to mid stenoses.  The RCA was a large dominant vessel with smooth 20% narrowing.  He had mild to moderate LV dysfunction and his ejection fraction was improved to 40 to 45% with apical kinesis and distal anterolateral hypocontractility.  His apical contour raised concern for possible apical thrombus.  An echo Doppler study was suspicious for a layer of mural thrombus apically which was different from July 2019.  As result he was started on apixaban.  I saw him for follow-up on November 28, 2017 at which time he continued to remain stable and has been without chest pain.  He is on Eliquis 5 mg, Plavix 75 mg, carvedilol 25 mg twice a day, atorvastatin 80 mg and has been on losartan 25 mg daily.  He denies chest tightness, baseline shortness of breath but had noted some mild shortness of breath with exertion.  During that evaluation, I discontinued losartan and initiated Entresto.  He took 24/25 mg twice daily for 2 weeks  and was then further titrated up to 49/51 mg twice a day.  He has continued to be on atorvastatin 80 mg with target LDL less than 70.  I saw him in December 2019 at which time he was feeling improved with initiation of Entresto since initiating.  Over the past year, he has been titrated up to maximal dosing at 97/103 mg twice a day.  He has continued to take carvedilol 25 mg twice a day.  He has been on Eliquis 5 mg twice a day with Plavix and has been on Prilosec.  He was evaluated in August 2020 by Dr. Bufford Buttner prior to undergoing colonoscopy given instructions to hold his Plavix and Eliquis.  The past he was documented to have an apical mural thrombus and on his recent echo in March 2020 EF was 40 to 45%.  There was septal and apical akinesis secondary to his prior MI.  There was no apical thrombus noted which had resolved from his previous go.  During that evaluation blood pressure was low.  When I saw him on January 03, 2019  he had noticed some episodes of mild dizziness.  He denied any palpitations or awareness of arrhythmia.  He had experienced some depression secondary to the COVID-19 pandemic.    During that evaluation his blood pressure was low and on exam there was a 10 mm orthostatic drop from supine to standing.  Consequently I reduced his Entresto back to 49/51 mg twice a day.  Since it was well over a year from his ACS and stenting I recommended discontinuance of Plavix and resuming aspirin 81 mg to take along with Eliquis 5 mg twice a day.  He underwent a follow-up echo Doppler study on April 30, 2019 which showed an EF of 35 to 40%.  There was grade 1 diastolic dysfunction.  There was severe akinesis of the apex and distal anteroseptal new from lateral wall.  Definity contrast was used and there was no apical thrombus.  I saw him in April 2021. At that time, he felt well and had both Covid vaccinations.  He was be planning to undergo a colonoscopy in May.  He denied chest pain PND  orthopnea.  He denied any orthostatic symptoms.  During that evaluation, he apparently was now on a further reduced dose of Entresto 24/26. His blood pressure was stable. I reviewed his echo Doppler study which showed an EF of 35 to 40% without thrombus. As result I discontinued Eliquis and recommend mended resumption of Plavix plus aspirin. I did recommend he undergo a P2Y12 test to make certain his Plavix responsive.  I saw him in November 2021.  He had developed some episodes of palpitations in early August 2021. He went to the emergency room. Laboratory was drawn. Troponin was negative. BNP was excellent at 24.4. There was a 13-hour wait and ultimately he ended up being told to leave since his labs were normal. He did subsequently wear a monitor from August 22 through October 29, 2019 which showed predominant sinus rhythm at 78 bpm. Slowest heart rate was 60 bpm which was sinus rhythm and his fastest heart rate was sinus tachycardia at 126 bpm which occurred at 12:55 AM. Subsequently, he has felt well. He denied chest pain PND orthopnea. He realized today that he had never had a P2 Y12 test done and he plans to do this.   Since I last saw him, he had developed episodes of some palpitations and went to the ED in [place] on May 08, 2020.  Troponins were negative.  Heart rate was increased at 110.  Had another episode where he noticed his heart rate increasing at a concert in [place].  He was seen on May 30, 2020 by Joni Reining, NP.  During her how evaluation he was stable.  He had undergone an echo Doppler study on April 29, 2020 which showed an EF at 35 to 40% grade 1 diastolic dysfunction.  Laboratory in April 2022 showed total cholesterol 100 triglycerides 183 HDL 30 and LDL 40.  Sh was slightly over suppressed at 0.34.  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  I saw him on October 06, 2020.  He was planning to go to [place] in [place] this weekend with his wife.  Last week he noted some twinges of nonexertional which occurred when he was sitting down and also an occasional skipped beat.  He apparently went to [facility] ER and was stable.  In that evaluation, his ECG was unremarkable and showed his old anterior wall MI with QS complex V1 through V5.  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  I last saw him in March 10, 2021, at which time he continued to feel well.  He has continued to feel well.  However he was recently evaluated at [facility] ER with atypical chest pain.  He had experienced some sharp twinges of chest pain which were nonexertional.  Troponins were normal.  ECG was stable.  He was not felt to have an ischemic etiology.  He denied dizziness on his reduced dose of Entresto and continued to be on 24/26 mg twice daily in addition to carvedilol 25 mg twice a day.  His BNP level on March 01, 2021 was excellent at 12.1.  Creatinine was stable at 0.78.  CBC was stable.  During that evaluation, he was euvolemic on exam and was without CHF.  I recommended that he undergo a follow-up echo Doppler study prior to his next office visit.  I last saw him on July 15, 2021.  Mr. Vidovich felt well and has been without recurrent chest pain or significant shortness of breath.  He underwent his echo Doppler evaluation on Jun 30, 2021 this was notable for EF at 45 to 50% aneurysmal dilatation of the apex.  It was mild dilation of his left atrium.  Mitral and aortic valves were essentially normal.  Only, he was noted to have a very large abdominal aortic aneurysm with a maximum diameter of 6.3 cm.  As result, I discussed this with Dr. Durene Cal who I saw in the hospital the day of his echo and scheduled the patient to have a CT angio of his chest, abdomen and pelvis with planned expeditious follow-up with Dr.  Myra Gianotti.  The CT scan was done on Jul 08, 2021 which revealed 2 infrarenal aortic aneurysm measuring up to 6.1 x 8.7 cm.  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  Incidentally he was also noted to have cholelithiasis, left adrenal adenoma, tiny left renal stone and multiple bilateral thyroid nodules in addition to aortic atherosclerosis with coronary artery calcification and mild emphysema.  He was evaluated by Dr. Myra Gianotti in the office yesterday, July 14, 2021 and was recommended that the best option would be endovascular repair which would require dealing with all 4 visceral vessels.  As result Mr. Ullery has been referred to Dr. Pattricia Boss at [facility] and has an appointment to see him tomorrow.    Since my last evaluation, he was evaluated in [facility] and was scheduled to undergo complex abdominal aortic aneurysm surgery by Dr. Pattricia Boss.  However this ultimately was deferred from October due to development of COVID and he ultimately underwent complex successful 6-hour operation in November 2023 with fenestrated aorto biiliac endovascular stent graft placement with celiac, SMA, and single bilateral renal artery branch stents.  Apparently 1 week later he had a leak and required repeat surgery in December 2023.  He has been undergoing every 46-month imaging studies for the past year and had his last evaluation on January 13, 2023.  The stent graft was stable without evidence of complication.  Incidentally he was noted to have radiopaque gallstones within the  right upper quadrant.  He tells me he will now be undergoing every 69-month evaluation.  I have not seen him over the past 18 months, and he re-presents for follow-up Cardiologic evaluation.  Presently, he denies any chest pain.  He is back at work.  He does notice some mild  shortness of breath typically with activity.  He denies any chest pain.  He is unaware of palpitations.  He continues to be on DAPT with aspirin/Plavix, carvedilol 25 mg twice a day, Entresto 24/26 mg twice a day, and is on atorvastatin 80 mg daily.  He presents for evaluation.   Past Medical History:  Diagnosis Date   ACS (acute coronary syndrome) (HCC) 11/2015   Atypical chest pain    Negative Myoview 2010   Coronary artery disease    a.s/p STEMI in 01/2015 requiring placement of 4 DES to LAD. b. 11/2015: NST showing apical scar with minimal peri-infarct ischemia.   Essential hypertension    Food allergy    Anaphylaxis with shell fish   GERD (gastroesophageal reflux disease)    History of migraines    Hyperlipidemia    Ischemic cardiomyopathy    a. Echo 05/2015: EF 40-45%, apical akineiss with Grade 1 DD.   Myocardial infarction [facility]) 2016   Testicle cancer (HCC)    In remission - followed by Dr. Isabel Caprice; had bilateral orchiectomy and XRT in past    Past Surgical History:  Procedure Laterality Date   CARDIAC CATHETERIZATION N/A 01/15/2015   Procedure: Left Heart Cath and Coronary Angiography;  Surgeon: Lennette Bihari, MD;  Location: [facility] INVASIVE CV LAB;  Service: Cardiovascular;  Laterality: N/A;   CARDIAC CATHETERIZATION  01/15/2015   Procedure: Coronary Stent Intervention;  Surgeon: Lennette Bihari, MD;  Location: [facility] INVASIVE CV LAB;  Service: Cardiovascular;;   CORONARY BALLOON ANGIOPLASTY N/A 08/17/2017   Procedure: CORONARY BALLOON ANGIOPLASTY;  Surgeon: Marykay Lex, MD;  Location: [facility] INVASIVE CV LAB;  Service: Cardiovascular;  Laterality: N/A;   LEFT HEART CATH AND CORONARY ANGIOGRAPHY N/A 08/17/2017   Procedure: LEFT HEART CATH AND CORONARY ANGIOGRAPHY;  Surgeon: Marykay Lex, MD;  Location: [facility] INVASIVE CV LAB;  Service: Cardiovascular;  Laterality: N/A;   LEFT HEART CATH AND CORONARY ANGIOGRAPHY N/A 11/09/2017   Procedure: LEFT HEART CATH AND CORONARY ANGIOGRAPHY;   Surgeon: Lennette Bihari, MD;  Location: [facility] INVASIVE CV LAB;  Service: Cardiovascular;  Laterality: N/A;   ORCHIECTOMY Bilateral     Allergies  Allergen Reactions   Iodine Anaphylaxis    Shellfish allergy    Shellfish Allergy Anaphylaxis   Penicillins Other (See Comments)    Childhood allergy Has patient had a PCN reaction causing immediate rash, facial/tongue/throat swelling, SOB or lightheadedness with hypotension: Unknown Has patient had a PCN reaction causing severe rash involving mucus membranes or skin necrosis: Unknown Has patient had a PCN reaction that required hospitalization: No Has patient had a PCN reaction occurring within the last 10 years: No If all of the above answers are "NO", then may proceed with Cephalosporin use.     Ace Inhibitors Cough    Current Outpatient Medications  Medication Sig Dispense Refill   aspirin EC 81 MG tablet Take 1 tablet (81 mg total) by mouth daily. (Patient taking differently: Take 81 mg by mouth at bedtime.) 90 tablet 3   atorvastatin (LIPITOR) 80 MG tablet TAKE 1 TABLET(80 MG) BY MOUTH DAILY 90 tablet 0   carvedilol (COREG) 25 MG tablet TAKE 1 TABLET(25 MG) BY MOUTH  TWICE DAILY WITH A MEAL 180 tablet 3   clopidogrel (PLAVIX) 75 MG tablet Take 1 tablet (75 mg total) by mouth daily. Pt must keep upcoming appt Dec 10th for further refills 30 tablet 0   ENTRESTO 24-26 MG TAKE 1 TABLET BY MOUTH TWICE DAILY 180 tablet 2   EPIPEN 2-PAK 0.3 MG/0.3ML SOAJ injection INJECT 0.3 MLS INTO THE MUSCLE ONCE **PATIENT NEEDS TO SCHEDULE AN OFFICE VISIT** (Patient taking differently: Inject 0.3 mg into the muscle as needed (for an allergic reaction).) 2 Device 0   meclizine (ANTIVERT) 25 MG tablet Take 1 tablet (25 mg total) by mouth 3 (three) times daily as needed for dizziness. 15 tablet 0   Testosterone 1.62 % GEL Apply 4 Pump topically daily.      nitroGLYCERIN (NITROSTAT) 0.4 MG SL tablet DISSOLVE 1 TABLET UNDER THE TONGUE EVERY 5 MINUTES AS NEEDED FOR  CHEST PAIN FOR 3 DOSES 25 tablet 2   No current facility-administered medications for this visit.    Social History   Socioeconomic History   Marital status: Married    Spouse name: Not on file   Number of children: Not on file   Years of education: Not on file   Highest education level: Not on file  Occupational History   Not on file  Tobacco Use   Smoking status: Former   Smokeless tobacco: Never   Tobacco comments:    quit around 2010  Vaping Use   Vaping status: Never Used  Substance and Sexual Activity   Alcohol use: Yes    Alcohol/week: 0.0 standard drinks of alcohol    Comment: occasionally   Drug use: No    Comment: remote use of marijuana, quit a long time ago   Sexual activity: Not on file  Other Topics Concern   Not on file  Social History Narrative   Occupation:Senior Trader for [employer]   Divorced    No children      Former smoker    Alcohol Use - yes         Social Drivers of Corporate investment banker Strain: Low Risk  (01/04/2022)   [facility], [facility]   Overall Financial Resource Strain (CARDIA)    Difficulty of Paying Living Expenses: Not very hard  Food Insecurity: No Food Insecurity (01/04/2022)   Received from [facility], [facility]   Hunger Vital Sign    Worried About Running Out of Food in the Last Year: Never true    Ran Out of Food in the Last Year: Never true  Transportation Needs: No Transportation Needs (01/04/2022)   Received from [facility], [facility]   [facility] - Transportation    Lack of Transportation (Medical): No    Lack of Transportation (Non-Medical): No  Physical Activity: Not on file  Stress: Not on file  Social Connections: Unknown (06/22/2021)   Received from [facility], [facility]   Social Network    Social Network: Not on file  Intimate Partner Violence: Not At Risk (06/25/2022)   Received from [facility]   HITS    Over the last 12 months how often did your  partner physically hurt you?: Never    Over the last 12 months how often did your partner insult you or talk down to you?: Never    Over the last 12 months how often did your partner threaten you with physical harm?: Never    Over the last  12 months how often did your partner scream or curse at you?: Never   Additional social history is notable that he was born in [place], [place].  He lived for over a decade in [place].  He is married.  Former smoker.  Family History  Problem Relation Age of Onset   Heart attack Father        CABG at agae 83   Stroke Paternal Grandfather    Hypertension Other    Hyperlipidemia Other    Heart attack Maternal Grandmother     ROS General: Negative; No fevers, chills, or night sweats HEENT: Negative; No changes in vision or hearing, sinus congestion, difficulty swallowing Pulmonary: Negative; No cough, wheezing, shortness of breath, hemoptysis Cardiovascular: See HPI:  GI: Negative; No nausea, vomiting, diarrhea, or abdominal pain GU: History of testicular cancer, status post orchiectomy.  Gallstones Musculoskeletal: Negative; no myalgias, joint pain, or weakness Hematologic: Negative; no easy bruising, bleeding Endocrine: Negative; no heat/cold intolerance; no diabetes, Neuro: Negative; no changes in balance, headaches Skin: Negative; No rashes or skin lesions Psychiatric: Negative; No behavioral problems, depression Sleep: Negative; No snoring,  daytime sleepiness, hypersomnolence, bruxism, restless legs, hypnogognic hallucinations. Other comprehensive 14 point system review is negative   Physical Exam BP 122/74   Pulse 67   Ht 6' (1.829 m)   Wt 222 lb 6.4 oz (100.9 kg)   SpO2 94%   BMI 30.16 kg/m    Repeat blood pressure by me was 112/70  Wt Readings from Last 3 Encounters:  01/18/23 222 lb 6.4 oz (100.9 kg)  12/21/21 210 lb 4.8 oz (95.4 kg)  07/15/21 209 lb 6.4 oz (95 kg)   General: Alert, oriented, no distress.  Skin:  normal turgor, no rashes, warm and dry HEENT: Normocephalic, atraumatic. Pupils equal round and reactive to light; sclera anicteric; extraocular muscles intact;  Nose without nasal septal hypertrophy Mouth/Parynx benign; Mallinpatti scale 3 Neck: No JVD, no carotid bruits; normal carotid upstroke Lungs: clear to ausculatation and percussion; no wheezing or rales Chest wall: without tenderness to palpitation Heart: PMI not displaced, RRR, s1 s2 normal, 1/6 systolic murmur, no diastolic murmur, no rubs, gallops, thrills, or heaves Abdomen: soft, nontender; no hepatosplenomehaly, BS+; abdominal aorta nontender and not dilated by palpation. Back: no CVA tenderness Pulses 2+ Musculoskeletal: full range of motion, normal strength, no joint deformities Extremities: no clubbing cyanosis or edema, Homan's sign negative  Neurologic: grossly nonfocal; Cranial nerves grossly wnl Psychologic: Normal mood and affect  EKG Interpretation Date/Time:  Tuesday January 18 2023 13:42:40 EST Ventricular Rate:  67 PR Interval:  192 QRS Duration:  86 QT Interval:  300 QTC Calculation: 317 R Axis:   -13  Text Interpretation: Normal sinus rhythm Cannot rule out Anterior infarct (cited on or before 09-Jan-2019) When compared with ECG of 27-Jun-2021 21:16, QT has shortened Confirmed by Nicki Guadalajara ([id number]) on 01/21/2023 1:22:48 PM     July 15, 2021 ECG (independently read by me): NSR at 56, old anteroseptal MI with Q V1-4; no ectopy  March 10, 2021 ECG (independently read by me): Sinus rhythm at 71 bpm.  QS complex V1 through V5.  Normal intervals.  No ectopy.  October 06, 2020 ECG (independently read by me):  NSR at 78; possible LAE, QS V1-5 c/w prior anterior MI  November 2021 ECG (independently read by me): Normal sinus rhythm at 78 bpm.  QS complex V1 through V4 unchanged.  No ectopy.  April 2021 ECG (independently read by me): NSR at 72;  QS V1-4 c/w prior anterior MI: no ectopy; normal  intervals  November 2020 ECG (independently read by me): Normal sinus rhythm at 78 bpm.  QS complex V1 through V4 consistent with prior anterior MI.  No ectopy.  QTc interval 389 ms.  Parable 1 7 8  ms  December 2019 ECG (independently read by me): Normal sinus rhythm at 75 bpm.  QS complex V1 through V4 consistent with old anterior MI.  Normal intervals.  No ectopy.  November 28, 2017 ECG (independently read by me): Normal sinus rhythm at 83 bpm.  QS complex V1 through V5 consistent with old anterior MI.  Normal intervals.  No ectopy.  March 2019 ECG (independently read by me): Normal sinus rhythm at 79 bpm.  Old anterolateral infarct changes.  QRS complex V1 through V5; normal intervals.  No significant ST changes.  January 2018 ECG (independently read by me): Normal sinus rhythm at 92 bpm.  Anterior Q waves consistent with his prior anterolateral MI.  September 2017 ECG (independently read by me): Sinus rhythm at 83 bpm.  QRS complex V1-V6  concordant with his large anterolateral MI.  May 2017 ECG (independently read by me): Normal sinus rhythm at 79 bpm.  T-wave inversion V2 through V6 and 1 and aVL concordant with his prior anterior to anterolateral MI.  March 2017 ECG (independently read by me): NSR at 80; QS V1-4 with T wave inversion V1-V6, I and aVL  LABS:     Latest Ref Rng & Units 06/27/2021    9:16 PM 03/01/2021   11:49 AM 09/10/2019   11:47 AM  BMP  Glucose 70 - 99 mg/dL 784  696  295   BUN 6 - 20 mg/dL 19  18  13    Creatinine 0.61 - 1.24 mg/dL 2.84  1.32  4.40   Sodium 135 - 145 mmol/L 140  140  138   Potassium 3.5 - 5.1 mmol/L 3.7  3.9  3.9   Chloride 98 - 111 mmol/L 105  104  103   CO2 22 - 32 mmol/L 25  28  26    Calcium 8.9 - 10.3 mg/dL 9.1  9.1  8.8         Latest Ref Rng & Units 05/30/2020    9:30 AM 05/29/2019    9:55 AM 02/07/2018   12:16 PM  Hepatic Function  Total Protein 6.0 - 8.5 g/dL 7.1  6.5  6.3   Albumin 3.8 - 4.9 g/dL 4.5  4.3  4.3   AST 0 - 40 IU/L  12  13  15    ALT 0 - 44 IU/L 16  14  21    Alk Phosphatase 44 - 121 IU/L 114  102  97   Total Bilirubin 0.0 - 1.2 mg/dL 1.9  1.7  1.3   Bilirubin, Direct 0.00 - 0.40 mg/dL 1.02          Latest Ref Rng & Units 06/27/2021    9:16 PM 03/01/2021   11:49 AM 09/10/2019   11:47 AM  CBC  WBC 4.0 - 10.5 K/uL 9.4  9.8  8.4   Hemoglobin 13.0 - 17.0 g/dL 72.5  36.6  44.0   Hematocrit 39.0 - 52.0 % 41.4  41.1  44.0   Platelets 150 - 400 K/uL 171  163  193    Lab Results  Component Value Date   MCV 87.2 06/27/2021   MCV 88.2 03/01/2021   MCV 89.1 09/10/2019    Lab Results  Component Value Date  TSH 0.453 06/02/2021    BNP    Component Value Date/Time   BNP 12.1 03/01/2021 1149   BNP <2.0 06/26/2010 1334    ProBNP    Component Value Date/Time   PROBNP 46 02/07/2018 1216   PROBNP <2.0 pg/mL 10/17/2009 1834     Lipid Panel     Component Value Date/Time   CHOL 84 (L) 10/06/2020 1032   TRIG 105 10/06/2020 1032   HDL 28 (L) 10/06/2020 1032   CHOLHDL 3.0 10/06/2020 1032   CHOLHDL 3.5 11/09/2017 0356   VLDL 27 11/09/2017 0356   LDLCALC 36 10/06/2020 1032   LDLDIRECT 99.9 09/15/2012 1738     RADIOLOGY: No results found.  ECHO: 07/08/2021  1. There is no left ventricular apical thrombus (Definity was used). Left  ventricular ejection fraction, by estimation, is 45 to 50%. The left  ventricle has mildly decreased function. The left ventricle demonstrates  regional wall motion abnormalities  (see scoring diagram/findings for description). Left ventricular diastolic  parameters are consistent with Grade I diastolic dysfunction (impaired  relaxation).   2. Right ventricular systolic function is normal. The right ventricular  size is mildly enlarged.   3. Left atrial size was mildly dilated.   4. The pericardial effusion is surrounding the apex.   5. The mitral valve is normal in structure. Trivial mitral valve  regurgitation. No evidence of mitral stenosis.   6. The aortic  valve is normal in structure. Aortic valve regurgitation is  not visualized. No aortic stenosis is present.   7. There is a large abdominal aortic aneurysm with a maximum diameter of  6.3 cm. Unable to establish relationship to the renal arteries on this  cardiac study.   8. The inferior vena cava is normal in size with greater than 50%  respiratory variability, suggesting right atrial pressure of 3 mmHg.   Comparison(s): Prior images reviewed side by side. The left ventricular  hypertrophy has improved. The left ventricular wall motion unchanged. 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 Discussed with Dr. Tresa Endo. Will  order CT aortic angiography and refer  to Vascular Surgery.    Records from [facility] since his last office visit with me were thoroughly reviewed.   IMPRESSION:  1. History of ST elevation myocardial infarction (STEMI):01/15/2015   2. CAD S/P percutaneous coronary interventions: December 2016, July 2019   3. Chronic combined systolic and diastolic heart failure (HCC)   4. Ischemic cardiomyopathy   5. Abdominal aortic aneurysm (AAA) without rupture [facility]): s/p aortobiiliac endovascular stent graft with celiac, SMA, and single bilateral renal artery branch stents   6. Gallstones   7. Gilbert's syndrome     ASSESSMENT AND PLAN: Mr. Pratheek Rideout is a 62 -year-old white male who presented to [facility] on the evening of 01/14/2015 with late presentation  anterolateral MI which most likely occurred the day prior to his presentation.  In the emergency room, he was pain-free. Cardiac catheterization revealed severe diffuse CAD with ulcerated plaque and thrombus and evidence for spontaneous distal LAD dissection.  I performed a very difficult but successful PCI to his LAD with ultimate insertion of 3 Synergy DES stents covering a total of 96 mm and extending from the ostium to the mid LAD.  He also had PTCA of his distal LAD dissection.  A echo Doppler showed an EF  of 40-45% and apical akinesis.  He was without  anginal symptomatology until July 2019 when he was found to have very focal 85%  in-stent restenosis in the LAD stent at the region of the diagonal vessel with occlusion of the diagonal.  He had excellent collateralization to this diagonal vessel.  He underwent successful cutting balloon.  His last catheterization was on November 09, 2017 which demonstrated patent LAD stents  and there was now both antegrade and extensive retrograde collateralization to the diagonal vessel.  Subsequently he was without recurrent angina.  He was transitioned from losartan to [facility].  He was treated with Eliquis due to apical thrombus which on his echo Doppler study in March 2021 had resolved.  EF was 35 to 40% with grade 1 diastolic dysfunction and there was severe akinesis of the apex and distal anteroseptal lateral wall.  He had developed increasing palpitations leading to a visit to the ER.  Laboratory was negative.  A subsequent Holter monitor did not reveal any significant ectopy with predominant sinus rhythm with an average rate at 78, slowest heart rate at sinus bradycardia at 60 bpm and fastest heart rate being sinus tachycardia at 126 bpm.  He had been titrated on Entresto to 49/51 mg twice daily but due to low blood pressure this was reduced back to 24/26 mg twice a day.  He has had issues with atypical sharp chest pain and has had several ER evaluations and his chest pain was not felt to be ischemic in etiology with normal troponins.  He has a history of mild indirect bilirubin elevation most likely consistent with Gilbert syndrome.  I recommended a follow-up echo Doppler study on his current regimen.  His echo Doppler study performed on Jun 30, 2021 showed slightly improved EF at 45 to 50% with an akinetic aneurysm aneurysmally dilated apex.  There was grade 1 diastolic dysfunction.  There was no significant valvular abnormality.  However, on that echo it was noted that he  had a very large 6.3 cm abdominal aortic aneurysm.  Subsequent CT imaging confirmed a large areas of measuring up to 6.1 x 8.7 cm for which he was referred to see Dr. Myra Gianotti who recommended he have his surgery done by Dr. Pattricia Boss at [facility] with endovascular repair that will require dealing with all 4 visceral vessels rather than open surgery.  Previously undergone extensive abdominal radiation for his testicular cancer.  I have not seen him since his surgery, but ultimately he underwent complex prolonged 6-hour operation in November 2023 with fenestrated aortobiiliac endovascular stent graft placement with celiac, SMA, and single bilateral renal artery branch stents.  Unfortunately, 1 week later he had a bleed requiring repeat surgery with ultimate stability.  Over the past year, he has been undergoing every 48-month imaging at [facility] with his most recent on January 13, 2023 showing stable stent graft without evidence of kinking, fracture, migration or component separation.  Presently he is without anginal symptomatology.  He continues to be on guideline directed medical therapy with his mildly reduced LV function with carvedilol 25 mg twice a day in addition to Entresto 24/26 mg twice a day.  He has been maintained on atorvastatin 80 mg. He does admit to some occasional shortness of breath with activity.  His ECG today is stable with sinus rhythm at 67 with poor R wave progression V1 through V3 consistent with his prior anterolateral MI.  I will try to obtain records of recent laboratory.  LDL cholesterol in 2022 had reduced to 36 with treatment.  Target LDL is less than 55.  I have recommended that he undergo a follow-up echo  Doppler evaluation in February 2025 for reassessment of LV systolic and diastolic function, valvular architecture on his current medical regimen.  Additional guideline directed medical therapy may be added depending upon results.  He is followed by Dr. Ihor Dow.  I will see him in  March 2025 for follow-up evaluation.  I discussed with him my plans for future retirement and after I retire, I will transition him to Dr. Bryan Lemma.    Lennette Bihari, MD, [facility]  01/21/2023 1:54 PM

## 2023-01-18 NOTE — Patient Instructions (Signed)
Medication Instructions:  *If you need a refill on your cardiac medications before your next appointment, please call your pharmacy*   Lab Work: If you have labs (blood work) drawn today and your tests are completely normal, you will receive your results only by: MyChart Message (if you have MyChart) OR A paper copy in the mail If you have any lab test that is abnormal or we need to change your treatment, we will call you to review the results.  Testing:  Your physician has requested that you have an echocardiogram February 2025. Echocardiography is a painless test that uses sound waves to create images of your heart. It provides your doctor with information about the size and shape of your heart and how well your heart's chambers and valves are working. This procedure takes approximately one hour. There are no restrictions for this procedure. Please do NOT wear cologne, perfume, aftershave, or lotions (deodorant is allowed). Please arrive 15 minutes prior to your appointment time.  Please note: We ask at that you not bring children with you during ultrasound (echo/ vascular) testing. Due to room size and safety concerns, children are not allowed in the ultrasound rooms during exams. Our front office staff cannot provide observation of children in our lobby area while testing is being conducted. An adult accompanying a patient to their appointment will only be allowed in the ultrasound room at the discretion of the ultrasound technician under special circumstances. We apologize for any inconvenience.   Follow-Up: At [facility], you and your health needs are our priority.  As part of our continuing mission to provide you with exceptional heart care, we have created designated Provider Care Teams.  These Care Teams include your primary Cardiologist (physician) and Advanced Practice Providers (APPs -  Physician Assistants and Nurse Practitioners) who all work together to provide you with  the care you need, when you need it.  We recommend signing up for the patient portal called "MyChart".  Sign up information is provided on this After Visit Summary.  MyChart is used to connect with patients for Virtual Visits (Telemedicine).  Patients are able to view lab/test results, encounter notes, upcoming appointments, etc.  Non-urgent messages can be sent to your provider as well.   To learn more about what you can do with MyChart, go to [email or web address].    Your next appointment:   3 month(s)  Provider:   Nicki Guadalajara, MD

## 2023-01-21 ENCOUNTER — Other Ambulatory Visit (HOSPITAL_COMMUNITY): Payer: Self-pay

## 2023-01-21 ENCOUNTER — Encounter: Payer: Self-pay | Admitting: Cardiovascular Disease

## 2023-01-21 ENCOUNTER — Telehealth: Payer: Self-pay | Admitting: Cardiovascular Disease

## 2023-01-21 ENCOUNTER — Other Ambulatory Visit (HOSPITAL_BASED_OUTPATIENT_CLINIC_OR_DEPARTMENT_OTHER): Payer: Self-pay

## 2023-01-21 ENCOUNTER — Other Ambulatory Visit: Payer: Self-pay | Admitting: *Deleted

## 2023-01-21 MED ORDER — NITROGLYCERIN 0.4 MG SL SUBL
SUBLINGUAL_TABLET | SUBLINGUAL | 2 refills | Status: DC
  Filled 2023-01-21: qty 25, 5d supply, fill #0

## 2023-01-21 NOTE — Telephone Encounter (Signed)
 *  STAT* If patient is at the pharmacy, call can be transferred to refill team.   1. Which medications need to be refilled? (please list name of each medication and dose if known)   nitroGLYCERIN (NITROSTAT) 0.4 MG SL tablet   2. Which pharmacy/location (including street and city if local pharmacy) is medication to be sent to?  [id number] - [place], [place] - [address]    3. Do they need a 30 day or 90 day supply? 30 day

## 2023-02-01 ENCOUNTER — Other Ambulatory Visit (HOSPITAL_COMMUNITY): Payer: Self-pay

## 2023-02-09 ENCOUNTER — Other Ambulatory Visit: Payer: Self-pay | Admitting: Cardiology

## 2023-02-21 ENCOUNTER — Other Ambulatory Visit (HOSPITAL_COMMUNITY): Payer: BC Managed Care – PPO

## 2023-02-21 ENCOUNTER — Other Ambulatory Visit: Payer: Self-pay | Admitting: Cardiovascular Disease

## 2023-02-25 ENCOUNTER — Other Ambulatory Visit (HOSPITAL_BASED_OUTPATIENT_CLINIC_OR_DEPARTMENT_OTHER): Payer: BC Managed Care – PPO

## 2023-03-08 ENCOUNTER — Ambulatory Visit (HOSPITAL_BASED_OUTPATIENT_CLINIC_OR_DEPARTMENT_OTHER): Payer: BC Managed Care – PPO

## 2023-03-08 DIAGNOSIS — I255 Ischemic cardiomyopathy: Secondary | ICD-10-CM

## 2023-03-08 DIAGNOSIS — I5042 Chronic combined systolic (congestive) and diastolic (congestive) heart failure: Secondary | ICD-10-CM

## 2023-03-08 LAB — ECHOCARDIOGRAM COMPLETE
Area-P 1/2: 4.06 cm2
S' Lateral: 2.76 cm

## 2023-03-08 MED ORDER — PERFLUTREN LIPID MICROSPHERE
1.0000 mL | INTRAVENOUS | Status: AC | PRN
  Administered 2023-03-08: 1 mL via INTRAVENOUS

## 2023-03-09 ENCOUNTER — Encounter: Payer: Self-pay | Admitting: Cardiology

## 2023-03-15 ENCOUNTER — Ambulatory Visit (INDEPENDENT_AMBULATORY_CARE_PROVIDER_SITE_OTHER): Payer: BC Managed Care – PPO | Admitting: Family Medicine

## 2023-03-15 ENCOUNTER — Encounter (HOSPITAL_BASED_OUTPATIENT_CLINIC_OR_DEPARTMENT_OTHER): Payer: Self-pay | Admitting: Family Medicine

## 2023-03-15 VITALS — BP 125/82 | HR 75 | Ht 72.0 in | Wt 221.6 lb

## 2023-03-15 DIAGNOSIS — Z0000 Encounter for general adult medical examination without abnormal findings: Secondary | ICD-10-CM | POA: Insufficient documentation

## 2023-03-15 NOTE — Assessment & Plan Note (Signed)
Routine HCM labs ordered. HCM reviewed/discussed. Anticipatory guidance regarding healthy weight, lifestyle and choices given. Recommend healthy diet.  Recommend approximately 150 minutes/week of moderate intensity exercise Recommend regular dental and vision exams Always use seatbelt/lap and shoulder restraints Recommend using smoke alarms and checking batteries at least twice a year Recommend using sunscreen when outside Discussed colon cancer screening recommendations, options.  Patient is UTD Discussed recommendations for shingles vaccine.  Patient declines at this time, will consider Discussed tetanus immunization recommendations, patient would like to have this done in the future

## 2023-03-15 NOTE — Patient Instructions (Signed)
  Medication Instructions:  Your physician recommends that you continue on your current medications as directed. Please refer to the Current Medication list given to you today. --If you need a refill on any your medications before your next appointment, please call your pharmacy first. If no refills are authorized on file call the office.-- Lab Work: Your physician has recommended that you have lab work today: fasting when able If you have labs (blood work) drawn today and your tests are completely normal, you will receive your results via MyChart message OR a phone call from our staff.  Please ensure you check your voicemail in the event that you authorized detailed messages to be left on a delegated number. If you have any lab test that is abnormal or we need to change your treatment, we will call you to review the results.   Follow-Up: Your next appointment:   Your physician recommends that you schedule a follow-up appointment in: 1 year physical  with Dr. de Peru  You will receive a text message or e-mail with a link to a survey about your care and experience with us  today! We would greatly appreciate your feedback!   Thanks for letting us  be apart of your health journey!!  Primary Care and Sports Medicine   Dr. Court Distance Peru   We encourage you to activate your patient portal called "MyChart".  Sign up information is provided on this After Visit Summary.  MyChart is used to connect with patients for Virtual Visits (Telemedicine).  Patients are able to view lab/test results, encounter notes, upcoming appointments, etc.  Non-urgent messages can be sent to your provider as well. To learn more about what you can do with MyChart, please visit --  [email or web address].

## 2023-03-15 NOTE — Progress Notes (Signed)
 Subjective:    CC: Annual Physical Exam  HPI:  Cesar Woods is a 58 y.o. presenting for annual physical  I reviewed the past medical history, family history, social history, surgical history, and allergies today and no changes were needed.  Please see the problem list section below in epic for further details.  Past Medical History: Past Medical History:  Diagnosis Date   ACS (acute coronary syndrome) (HCC) 11/2015   Atypical chest pain    Negative Myoview  2010   Coronary artery disease    a.s/p STEMI in 01/2015 requiring placement of 4 DES to LAD. b. 11/2015: NST showing apical scar with minimal peri-infarct ischemia.   Essential hypertension    Food allergy    Anaphylaxis with shell fish   GERD (gastroesophageal reflux disease)    History of migraines    Hyperlipidemia    Ischemic cardiomyopathy    a. Echo 05/2015: EF 40-45%, apical akineiss with Grade 1 DD.   Myocardial infarction [facility]) 2016   Testicle cancer (HCC)    In remission - followed by Dr. Alline; had bilateral orchiectomy and XRT in past   Past Surgical History: Past Surgical History:  Procedure Laterality Date   CARDIAC CATHETERIZATION N/A 01/15/2015   Procedure: Left Heart Cath and Coronary Angiography;  Surgeon: Debby DELENA Sor, MD;  Location: [facility] INVASIVE CV LAB;  Service: Cardiovascular;  Laterality: N/A;   CARDIAC CATHETERIZATION  01/15/2015   Procedure: Coronary Stent Intervention;  Surgeon: Debby DELENA Sor, MD;  Location: [facility] INVASIVE CV LAB;  Service: Cardiovascular;;   CORONARY BALLOON ANGIOPLASTY N/A 08/17/2017   Procedure: CORONARY BALLOON ANGIOPLASTY;  Surgeon: Anner Alm ORN, MD;  Location: [facility] INVASIVE CV LAB;  Service: Cardiovascular;  Laterality: N/A;   LEFT HEART CATH AND CORONARY ANGIOGRAPHY N/A 08/17/2017   Procedure: LEFT HEART CATH AND CORONARY ANGIOGRAPHY;  Surgeon: Anner Alm ORN, MD;  Location: [facility] INVASIVE CV LAB;  Service: Cardiovascular;  Laterality: N/A;   LEFT HEART CATH AND CORONARY  ANGIOGRAPHY N/A 11/09/2017   Procedure: LEFT HEART CATH AND CORONARY ANGIOGRAPHY;  Surgeon: Sor Debby DELENA, MD;  Location: [facility] INVASIVE CV LAB;  Service: Cardiovascular;  Laterality: N/A;   ORCHIECTOMY Bilateral    Social History: Social History   Socioeconomic History   Marital status: Married    Spouse name: Not on file   Number of children: Not on file   Years of education: Not on file   Highest education level: Not on file  Occupational History   Not on file  Tobacco Use   Smoking status: Former   Smokeless tobacco: Never   Tobacco comments:    quit around 2010  Vaping Use   Vaping status: Never Used  Substance and Sexual Activity   Alcohol use: Yes    Alcohol/week: 0.0 standard drinks of alcohol    Comment: occasionally   Drug use: No    Comment: remote use of marijuana, quit a long time ago   Sexual activity: Not on file  Other Topics Concern   Not on file  Social History Narrative   Occupation:Senior Trader for [employer]   Divorced    No children      Former smoker    Alcohol Use - yes         Social Drivers of Health   Financial Resource Strain: Patient Declined (03/11/2023)   Overall Financial Resource Strain (CARDIA)    Difficulty of Paying Living Expenses: Patient declined  Food Insecurity: Patient Declined (03/11/2023)   Hunger Vital  Sign    Worried About Programme Researcher, Broadcasting/film/video in the Last Year: Patient declined    Barista in the Last Year: Patient declined  Transportation Needs: Patient Declined (03/11/2023)   PRAPARE - Administrator, Civil Service (Medical): Patient declined    Lack of Transportation (Non-Medical): Patient declined  Physical Activity: Unknown (03/11/2023)   Exercise Vital Sign    Days of Exercise per Week: Patient declined    Minutes of Exercise per Session: Not on file  Stress: Patient Declined (03/11/2023)   [employer] of Occupational Health - Occupational Stress Questionnaire    Feeling of Stress : Patient  declined  Social Connections: Unknown (03/11/2023)   Social Connection and Isolation Panel [NHANES]    Frequency of Communication with Friends and Family: Patient declined    Frequency of Social Gatherings with Friends and Family: Patient declined    Attends Religious Services: Patient declined    Database Administrator or Organizations: Patient declined    Attends Engineer, Structural: Not on file    Marital Status: Patient declined   Family History: Family History  Problem Relation Age of Onset   Heart attack Father        CABG at agae 69   Stroke Paternal Grandfather    Hypertension Other    Hyperlipidemia Other    Heart attack Maternal Grandmother    Allergies: Allergies  Allergen Reactions   Iodine Anaphylaxis    Shellfish allergy    Shellfish Allergy Anaphylaxis   Penicillins Other (See Comments)    Childhood allergy Has patient had a PCN reaction causing immediate rash, facial/tongue/throat swelling, SOB or lightheadedness with hypotension: Unknown Has patient had a PCN reaction causing severe rash involving mucus membranes or skin necrosis: Unknown Has patient had a PCN reaction that required hospitalization: No Has patient had a PCN reaction occurring within the last 10 years: No If all of the above answers are NO, then may proceed with Cephalosporin use.     Ace Inhibitors Cough   Medications: See med rec.  Review of Systems: No headache, visual changes, nausea, vomiting, diarrhea, constipation, dizziness, abdominal pain, skin rash, fevers, chills, night sweats, swollen lymph nodes, weight loss, chest pain, body aches, joint swelling, muscle aches, shortness of breath, mood changes, visual or auditory hallucinations.  Objective:    BP 125/82 (BP Location: Right Arm, Patient Position: Sitting, Cuff Size: Normal)   Pulse 75   Ht 6' (1.829 m)   Wt 221 lb 9.6 oz (100.5 kg)   SpO2 94%   BMI 30.05 kg/m   General: Well Developed, well nourished, and in  no acute distress.  Neuro: Alert and oriented x3, extra-ocular muscles intact, sensation grossly intact. Cranial nerves II through XII are intact, motor, sensory, and coordinative functions are all intact. HEENT: Normocephalic, atraumatic, pupils equal round reactive to light, neck supple, no masses, no lymphadenopathy, thyroid  nonpalpable. Oropharynx, nasopharynx, external ear canals are unremarkable. Skin: Warm and dry, no rashes noted.  Cardiac: Regular rate and rhythm, no murmurs rubs or gallops.  Respiratory: Clear to auscultation bilaterally. Not using accessory muscles, speaking in full sentences.  Abdominal: Soft, nontender, nondistended, positive bowel sounds, no masses, no organomegaly.  Musculoskeletal: Shoulder, elbow, wrist, hip, knee, ankle stable, and with full range of motion.  Impression and Recommendations:    Wellness examination Assessment & Plan: Routine HCM labs ordered. HCM reviewed/discussed. Anticipatory guidance regarding healthy weight, lifestyle and choices given. Recommend healthy diet.  Recommend  approximately 150 minutes/week of moderate intensity exercise Recommend regular dental and vision exams Always use seatbelt/lap and shoulder restraints Recommend using smoke alarms and checking batteries at least twice a year Recommend using sunscreen when outside Discussed colon cancer screening recommendations, options.  Patient is UTD Discussed recommendations for shingles vaccine.  Patient declines at this time, will consider Discussed tetanus immunization recommendations, patient would like to have this done in the future  Orders: -     CBC with Differential/Platelet; Future -     Comprehensive metabolic panel; Future -     Hemoglobin A1c; Future -     Lipid panel; Future -     TSH Rfx on Abnormal to Free T4; Future  Return in about 1 year (around 03/14/2024) for CPE.   ___________________________________________ Elaine Ro[place]horse de [place], MD, ABFM, CAQSM Primary  Care and Sports Medicine [facility]

## 2023-03-31 ENCOUNTER — Encounter (HOSPITAL_BASED_OUTPATIENT_CLINIC_OR_DEPARTMENT_OTHER): Payer: BC Managed Care – PPO | Admitting: Family Medicine

## 2023-04-06 ENCOUNTER — Telehealth: Payer: Self-pay | Admitting: Cardiovascular Disease

## 2023-04-06 NOTE — Telephone Encounter (Signed)
 Follow Up:      Cesar Woods is calling back, to check on the status of the clearance.

## 2023-04-06 NOTE — Telephone Encounter (Signed)
 I left a message for Cesar Woods to call back. I was able to obtain the name of the practice: [facility].   We are missing information.   Need procedure to be done at this time/ cannot provide blanket type clearance  If extractions to be done, need to know how many teeth and if simple or surgical extraction  Type of anesthesia if any being used  I am also going to fax these notes to [facility] asking for their office call back tomorrow with needed information.

## 2023-04-06 NOTE — Telephone Encounter (Signed)
   Pre-operative Risk Assessment    Patient Name: Cesar Woods  DOB: [birth year] MRN: [id number]   Date of last office visit: 01/18/2023 Date of next office visit: 05/05/2023  Request for Surgical Clearance    Procedure:   N/A at the moment  Date of Surgery:  Clearance TBD                                Surgeon:  Dr. Ailene Ards Group or Practice Name:  Dr. Ursula Beath Phone number:  [phone]  Fax number:  [phone]   Type of Clearance Requested:   - Medical  - Pharmacy:  Hold Aspirin and Clopidogrel (Plavix) TBD by cardiologist   Type of Anesthesia:  Not Indicated   Additional requests/questions:  Does this patient need antibiotics? Please fax a copy of medical clearance to the surgeon's office.  Mardelle Matte   04/06/2023, 11:25 AM

## 2023-04-07 NOTE — Telephone Encounter (Signed)
   Patient Name: Cesar Woods  DOB: 1965-04-02 MRN: [id number]  Primary Cardiologist: Nicki Guadalajara, MD  Chart reviewed as part of pre-operative protocol coverage.   Simple dental extractions (i.e. 1-2 teeth), cleanings and crowns are considered low risk procedures per guidelines and generally do not require any specific cardiac clearance. It is also generally accepted that for simple extractions and dental cleanings, there is no need to interrupt blood thinner therapy.   SBE prophylaxis is not required for the patient from a cardiac standpoint.  I will route this recommendation to the requesting party via Epic fax function and remove from pre-op pool.  Please call with questions.  Joylene Grapes, NP 04/07/2023, 11:12 AM

## 2023-04-07 NOTE — Telephone Encounter (Signed)
 Procedue: 1 crown and regular cleaning  Anesthesia is:  Local

## 2023-04-08 NOTE — Telephone Encounter (Signed)
 I will fax notes for clearance to the requesting office.

## 2023-05-02 ENCOUNTER — Other Ambulatory Visit (HOSPITAL_BASED_OUTPATIENT_CLINIC_OR_DEPARTMENT_OTHER): Payer: Self-pay

## 2023-05-02 DIAGNOSIS — Z0000 Encounter for general adult medical examination without abnormal findings: Secondary | ICD-10-CM | POA: Diagnosis not present

## 2023-05-03 LAB — CBC WITH DIFFERENTIAL/PLATELET
Basophils Absolute: 0 10*3/uL (ref 0.0–0.2)
Basos: 0 %
EOS (ABSOLUTE): 0.1 10*3/uL (ref 0.0–0.4)
Eos: 1 %
Hematocrit: 50.3 % (ref 37.5–51.0)
Hemoglobin: 16.7 g/dL (ref 13.0–17.7)
Immature Grans (Abs): 0 10*3/uL (ref 0.0–0.1)
Immature Granulocytes: 0 %
Lymphocytes Absolute: 1.6 10*3/uL (ref 0.7–3.1)
Lymphs: 21 %
MCH: 30.3 pg (ref 26.6–33.0)
MCHC: 33.2 g/dL (ref 31.5–35.7)
MCV: 91 fL (ref 79–97)
Monocytes Absolute: 0.5 10*3/uL (ref 0.1–0.9)
Monocytes: 7 %
Neutrophils Absolute: 5.3 10*3/uL (ref 1.4–7.0)
Neutrophils: 71 %
Platelets: 267 10*3/uL (ref 150–450)
RBC: 5.52 x10E6/uL (ref 4.14–5.80)
RDW: 12.7 % (ref 11.6–15.4)
WBC: 7.5 10*3/uL (ref 3.4–10.8)

## 2023-05-03 LAB — COMPREHENSIVE METABOLIC PANEL
ALT: 12 IU/L (ref 0–44)
AST: 16 IU/L (ref 0–40)
Albumin: 4.2 g/dL (ref 3.8–4.9)
Alkaline Phosphatase: 118 IU/L (ref 44–121)
BUN/Creatinine Ratio: 19 (ref 9–20)
BUN: 20 mg/dL (ref 6–24)
Bilirubin Total: 1.2 mg/dL (ref 0.0–1.2)
CO2: 21 mmol/L (ref 20–29)
Calcium: 8.9 mg/dL (ref 8.7–10.2)
Chloride: 105 mmol/L (ref 96–106)
Creatinine, Ser: 1.04 mg/dL (ref 0.76–1.27)
Globulin, Total: 2.5 g/dL (ref 1.5–4.5)
Glucose: 107 mg/dL — ABNORMAL HIGH (ref 70–99)
Potassium: 4.4 mmol/L (ref 3.5–5.2)
Sodium: 142 mmol/L (ref 134–144)
Total Protein: 6.7 g/dL (ref 6.0–8.5)
eGFR: 84 mL/min/{1.73_m2} (ref >59)

## 2023-05-03 LAB — LIPID PANEL
Chol/HDL Ratio: 3.6 ratio (ref 0.0–5.0)
Cholesterol, Total: 111 mg/dL (ref 100–199)
HDL: 31 mg/dL — ABNORMAL LOW (ref >39)
LDL Chol Calc (NIH): 53 mg/dL (ref 0–99)
Triglycerides: 156 mg/dL — ABNORMAL HIGH (ref 0–149)
VLDL Cholesterol Cal: 27 mg/dL (ref 5–40)

## 2023-05-03 LAB — HEMOGLOBIN A1C
Est. average glucose Bld gHb Est-mCnc: 123 mg/dL
Hgb A1c MFr Bld: 5.9 % — ABNORMAL HIGH (ref 4.8–5.6)

## 2023-05-03 LAB — TSH RFX ON ABNORMAL TO FREE T4: TSH: 0.538 u[IU]/mL (ref 0.450–4.500)

## 2023-05-05 ENCOUNTER — Encounter: Payer: Self-pay | Admitting: Cardiovascular Disease

## 2023-05-05 ENCOUNTER — Ambulatory Visit: Payer: BC Managed Care – PPO | Attending: Cardiovascular Disease | Admitting: Cardiovascular Disease

## 2023-05-05 DIAGNOSIS — I2510 Atherosclerotic heart disease of native coronary artery without angina pectoris: Secondary | ICD-10-CM | POA: Diagnosis not present

## 2023-05-05 DIAGNOSIS — Z9861 Coronary angioplasty status: Secondary | ICD-10-CM

## 2023-05-05 DIAGNOSIS — I252 Old myocardial infarction: Secondary | ICD-10-CM

## 2023-05-05 DIAGNOSIS — I10 Essential (primary) hypertension: Secondary | ICD-10-CM

## 2023-05-05 DIAGNOSIS — Z79899 Other long term (current) drug therapy: Secondary | ICD-10-CM

## 2023-05-05 DIAGNOSIS — R0602 Shortness of breath: Secondary | ICD-10-CM

## 2023-05-05 DIAGNOSIS — I255 Ischemic cardiomyopathy: Secondary | ICD-10-CM

## 2023-05-05 DIAGNOSIS — I714 Abdominal aortic aneurysm, without rupture, unspecified: Secondary | ICD-10-CM

## 2023-05-05 MED ORDER — SPIRONOLACTONE 25 MG PO TABS: 12.5000 mg | ORAL_TABLET | Freq: Every day | ORAL | 3 refills | Status: DC

## 2023-05-05 NOTE — Patient Instructions (Signed)
 Medication Instructions:  Begin Spirolactone 25mg . Take one half tablet 12.5mg  daily.   *If you need a refill on your cardiac medications before your next appointment, please call your pharmacy*   Lab Work: In one month return for lab work. If you have labs (blood work) drawn today and your tests are completely normal, you will receive your results only by: MyChart Message (if you have MyChart) OR A paper copy in the mail If you have any lab test that is abnormal or we need to change your treatment, we will call you to review the results.   Testing/Procedures: Your physician has requested that you have an echocardiogram in 1 month. Echocardiography is a painless test that uses sound waves to create images of your heart. It provides your doctor with information about the size and shape of your heart and how well your heart's chambers and valves are working. This procedure takes approximately one hour. There are no restrictions for this procedure. Please do NOT wear cologne, perfume, aftershave, or lotions (deodorant is allowed). Please arrive 15 minutes prior to your appointment time.  Please note: We ask at that you not bring children with you during ultrasound (echo/ vascular) testing. Due to room size and safety concerns, children are not allowed in the ultrasound rooms during exams. Our front office staff cannot provide observation of children in our lobby area while testing is being conducted. An adult accompanying a patient to their appointment will only be allowed in the ultrasound room at the discretion of the ultrasound technician under special circumstances. We apologize for any inconvenience.    Follow-Up: At [facility], you and your health needs are our priority.  As part of our continuing mission to provide you with exceptional heart care, we have created designated Provider Care Teams.  These Care Teams include your primary Cardiologist (physician) and Advanced  Practice Providers (APPs -  Physician Assistants and Nurse Practitioners) who all work together to provide you with the care you need, when you need it.  We recommend signing up for the patient portal called "MyChart".  Sign up information is provided on this After Visit Summary.  MyChart is used to connect with patients for Virtual Visits (Telemedicine).  Patients are able to view lab/test results, encounter notes, upcoming appointments, etc.  Non-urgent messages can be sent to your provider as well.   To learn more about what you can do with MyChart, go to [email or web address].    Your next appointment:   6 month(s)  Provider:   Dr. Herbie Baltimore     Other Instructions [facility]  [address] [place], [place] [address] [address] [phone]       1st Floor: - Lobby - Registration  - Pharmacy  - Lab - Cafe   2nd Floor: - PV Lab - Diagnostic Testing (echo, CT, nuclear med)   3rd Floor: - Vacant   4th Floor: - TCTS (cardiothoracic surgery) - AFib Clinic - Structural Heart Clinic - Vascular Surgery  - Vascular Ultrasound   5th Floor: - [facility] (general and EP) - Clinical Pharmacy for coumadin, hypertension, lipid, weight-loss medications, and med management appointments      Valet parking services will be available as well.       Thank you for choosing [facility]!

## 2023-05-05 NOTE — Progress Notes (Signed)
 Patient ID: Cesar Woods, male   DOB: September 02, 1965, 58 y.o.   MRN: [id number]       Primary MD: Dr. Almedia Balls  HPI: Cesar Woods is a 58 y.o. male who presents to the office today for an 3 month follow-up cardiology evaluation.  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  In December, he developed substernal chest discomfort which he initially attributed to GERD.  This continued to recur over that day.  The following day he went to work and continued to experience vague discomfort.  He was seen by his primary physician, Dr. Jenita Seashore in his ECG was abnormal.  Changes of an anterolateral MI.  He presented to the emergency room.  He was not having any chest pain or shortness of breath or palpitations.  He underwent cardiac catheterization the following morning on 01/15/2015 which revealed significant CAD with 90% ostial LAD stenosis followed by 99% proximal LAD stenosis in the region of the first septal perforating artery with significant thrombus.  There was diffuse 50% stenosis followed by 95% ulcerated plaque in the proximal to mid LAD.  There also appeared to be an apical LAD dissection.  40% ramus intermediate, like high marginal stenosis and a large dominant RCA with 30% mid stenosis.  Large PDA and PLA vessels with septal collateralization to the proximal LAD.  He was felt to have an out of hospital late presentation anterolateral infarction secondary to his multiple high-grade stenoses.  He underwent a very difficult but successful intervention to his diffusely diseased LAD.  There was evidence for a spiral dissection in the mid distal LAD but ultimately was insertion of 3 tandem synergy DES stents (0.538, 3.038, and 3.020 mm from the mid LAD to the ostium and PTCA of the distal LAD with low-level inflation tach up his apical initial dissection.  There was total occlusion of the diagonal  vessel antegrade but there was significant development of retrograde collaterals to this diagonal vessel from the distal LAD.  Subsequent, he stabilized well.  He was seen in follow-up by Cesar Woods on 01/24/2015 and was doing well without chest pain or shortness of breath.  At that time his blood pressure was controlled.  He participated in the cardiac rehabilitation program.  He is back at work and has a stressful job in Education officer, environmental for [employer].   When I saw him for follow-up evaluation in March 2017.  His ECG showed Q waves in V1 through V4 4 with persistent T-wave inversion anteriorly and anterolaterally.  I recommended that he discontinue metoprolol succinate and changed him to carvedilol.  He is now on carvedilol at 6.25 mg twice a day and is tolerating this well.  He continues to be on low-dose losartan 12.5 mg.  I scheduled him for follow-up echo Doppler study to reassess his LV function.  This was done on 05/30/2015.  Ejection fraction was now 40-45% and there was evidence for akinesis of the apical myocardium.  There was grade 1 diastolic dysfunction.  When I saw him in May 2017 for follow-up evaluation, he denied recurrent chest pain, PND or orthopnea.  At time, I slowly titrated ARB therapy and increase losartan to 25 mg at bedtime.  So increased his carvedilol to 9.375 mg twice a day for 2 weeks and further titrated this to 12.5 mg daily.   He developed a recurrent episode of chest pain in October 2017 and  was hospitalized.  A nuclear perfusion study was performed which showed his previous large fixed defect involving the apex extending towards the mid ventricle in the anterior and lateral wall.  There is very minimal if any peri-infarct ischemia with an EF of 43%.  These were felt to be consistent with his prior MI.    Since I  saw him in March 2018 he had been evaluated in the emergency room with intermittent chest and back pain.  He had undergone a stress Myoview study in January  2019 which showed an EF of 40% with a large defect consistent with his prior LAD infarction and there was no significant ischemia except mild peri-infarction ischemia.  In July 2019 he developed recurrent symptomatology and when I was on vacation and underwent repeat catheterization by Dr. Herbie Baltimore.  He was found to have 85% narrowing in the midportion of his long LAD stent in the region of the diagonal takeoff.  The diagonal vessel was occluded but had excellent collaterals.  He was rehospitalized in October 2019 with recurrent chest pain.  At that time on November 09, 2017 I performed repeat cardiac catheterization.  This revealed a widely patent LAD stent with mild residual narrowing of 20% at the site of recent in-stent restenosis that was treated with Cutting Balloon in July 2019.  The diagonal branch which arose from this segment has both antegrade filling and extensive retrograde collateralization being filled from the apical LAD.  The left circumflex marginal vessel had 30 and 35% proximal to mid stenoses.  The RCA was a large dominant vessel with smooth 20% narrowing.  He had mild to moderate LV dysfunction and his ejection fraction was improved to 40 to 45% with apical kinesis and distal anterolateral hypocontractility.  His apical contour raised concern for possible apical thrombus.  An echo Doppler study was suspicious for a layer of mural thrombus apically which was different from July 2019.  As result he was started on apixaban.  I saw him for follow-up on November 28, 2017 at which time he continued to remain stable and has been without chest pain.  He is on Eliquis 5 mg, Plavix 75 mg, carvedilol 25 mg twice a day, atorvastatin 80 mg and has been on losartan 25 mg daily.  He denies chest tightness, baseline shortness of breath but had noted some mild shortness of breath with exertion.  During that evaluation, I discontinued losartan and initiated Entresto.  He took 24/25 mg twice daily for 2 weeks  and was then further titrated up to 49/51 mg twice a day.  He has continued to be on atorvastatin 80 mg with target LDL less than 70.  I saw him in December 2019 at which time he was feeling improved with initiation of Entresto since initiating.  Over the past year, he has been titrated up to maximal dosing at 97/103 mg twice a day.  He has continued to take carvedilol 25 mg twice a day.  He has been on Eliquis 5 mg twice a day with Plavix and has been on Prilosec.  He was evaluated in August 2020 by Dr. Bufford Buttner prior to undergoing colonoscopy given instructions to hold his Plavix and Eliquis.  The past he was documented to have an apical mural thrombus and on his recent echo in March 2020 EF was 40 to 45%.  There was septal and apical akinesis secondary to his prior MI.  There was no apical thrombus noted which had resolved from his previous go.  During that evaluation blood pressure was low.  When I saw him on January 03, 2019  he had noticed some episodes of mild dizziness.  He denied any palpitations or awareness of arrhythmia.  He had experienced some depression secondary to the COVID-19 pandemic.    During that evaluation his blood pressure was low and on exam there was a 10 mm orthostatic drop from supine to standing.  Consequently I reduced his Entresto back to 49/51 mg twice a day.  Since it was well over a year from his ACS and stenting I recommended discontinuance of Plavix and resuming aspirin 81 mg to take along with Eliquis 5 mg twice a day.  He underwent a follow-up echo Doppler study on April 30, 2019 which showed an EF of 35 to 40%.  There was grade 1 diastolic dysfunction.  There was severe akinesis of the apex and distal anteroseptal new from lateral wall.  Definity contrast was used and there was no apical thrombus.  I saw him in April 2021. At that time, he felt well and had both Covid vaccinations.  He was be planning to undergo a colonoscopy in May.  He denied chest pain PND  orthopnea.  He denied any orthostatic symptoms.  During that evaluation, he apparently was now on a further reduced dose of Entresto 24/26. His blood pressure was stable. I reviewed his echo Doppler study which showed an EF of 35 to 40% without thrombus. As result I discontinued Eliquis and recommend mended resumption of Plavix plus aspirin. I did recommend he undergo a P2Y12 test to make certain his Plavix responsive.  I saw him in November 2021.  He had developed some episodes of palpitations in early August 2021. He went to the emergency room. Laboratory was drawn. Troponin was negative. BNP was excellent at 24.4. There was a 13-hour wait and ultimately he ended up being told to leave since his labs were normal. He did subsequently wear a monitor from August 22 through October 29, 2019 which showed predominant sinus rhythm at 78 bpm. Slowest heart rate was 60 bpm which was sinus rhythm and his fastest heart rate was sinus tachycardia at 126 bpm which occurred at 12:55 AM. Subsequently, he has felt well. He denied chest pain PND orthopnea. He realized today that he had never had a P2 Y12 test done and he plans to do this.   Since I last saw him, he had developed episodes of some palpitations and went to the ED in [place] on May 08, 2020.  Troponins were negative.  Heart rate was increased at 110.  Had another episode where he noticed his heart rate increasing at a concert in [place].  He was seen on May 30, 2020 by Joni Reining, NP.  During her how evaluation he was stable.  He had undergone an echo Doppler study on April 29, 2020 which showed an EF at 35 to 40% grade 1 diastolic dysfunction.  Laboratory in April 2022 showed total cholesterol 100 triglycerides 183 HDL 30 and LDL 40.  Sh was slightly over suppressed at 0.34.  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  I saw him on October 06, 2020.  He was planning to go to [place] and [place] this weekend with his wife.  Last week he noted some twinges of nonexertional which occurred when he was sitting down and also an occasional skipped beat.  He apparently went to [facility] ER and was stable.  In that evaluation, his ECG was unremarkable and showed his old anterior wall MI with QS complex V1 through V5.  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  I saw him in March 10, 2021, at which time he continued to feel well.  He has continued to feel well.  However he was recently evaluated at [facility] ER with atypical chest pain.  He had experienced some sharp twinges of chest pain which were nonexertional.  Troponins were normal.  ECG was stable.  He was not felt to have an ischemic etiology.  He denied dizziness on his reduced dose of Entresto and continued to be on 24/26 mg twice daily in addition to carvedilol 25 mg twice a day.  His BNP level on March 01, 2021 was excellent at 12.1.  Creatinine was stable at 0.78.  CBC was stable.  During that evaluation, he was euvolemic on exam and was without CHF.  I recommended that he undergo a follow-up echo Doppler study prior to his next office visit.  I last saw him on July 15, 2021.  Cesar. Cominsky felt well and has been without recurrent chest pain or significant shortness of breath.  He underwent his echo Doppler evaluation on Jun 30, 2021 this was notable for EF at 45 to 50% aneurysmal dilatation of the apex.  There was mild dilation of his left atrium.  Mitral and aortic valves were essentially normal.  Incedentally he was noted to have a very large abdominal aortic aneurysm with a maximum diameter of 6.3 cm.  As result, I discussed this with Dr. Durene Cal who I saw in the hospital the day of his echo and scheduled the patient to have a CT angio of his chest, abdomen and pelvis with planned expeditious follow-up with  Dr. Myra Gianotti.  The CT scan was done on Jul 08, 2021 which revealed 2 infrarenal aortic aneurysm measuring up to 6.1 x 8.7 cm.  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  Incidentally he was also noted to have cholelithiasis, left adrenal adenoma, tiny left renal stone and multiple bilateral thyroid nodules in addition to aortic atherosclerosis with coronary artery calcification and mild emphysema.  He was evaluated by Dr. Myra Gianotti in the office yesterday, July 14, 2021 and was recommended that the best option would be endovascular repair which would require dealing with all 4 visceral vessels.  As result Cesar. Oetken has been referred to Dr. Pattricia Boss at [facility] and has an appointment to see him tomorrow.    He was evaluated in [facility] and was scheduled to undergo complex abdominal aortic aneurysm surgery by Dr. Pattricia Boss.  However this ultimately was deferred from October due to development of COVID and he ultimately underwent complex successful 6-hour operation in November 2023 with fenestrated aorto biiliac endovascular stent graft placement with celiac, SMA, and single bilateral renal artery branch stents.  Apparently 1 week later he had a leak and required repeat surgery in December 2023.  He has been undergoing every 24-month imaging studies for the past year and had his last evaluation on January 13, 2023.  The stent graft was stable without evidence of complication.  Incidentally he was noted to have radiopaque gallstones within the right upper quadrant.  He  tells me he will now be undergoing every 36-month evaluation.  I have not seen him over the past 18 months, and he re-presents for follow-up Cardiologic evaluation.  After not having seen him in over 18 months, I saw him on January 18, 2023.  At that time he denied any chest  pain and was back at work.  Since he had many branching vessels arising from his endovascular stent graft he is participating in a study at [facility] and has 98-month evaluations.    He does notice some mild shortness of breath typically with activity.  He denies any chest pain.  He is unaware of palpitations.  He continues to be on DAPT with aspirin/Plavix, carvedilol 25 mg twice a day, Entresto 24/26 mg twice a day, and is on atorvastatin 80 mg daily.  During that evaluation I recommended he have a follow-up echo Doppler study for reassessment of LV function  He underwent echo Doppler evaluation on March 08, 2023.  This revealed stable findings with EF at 45 to 50%.  Entire apex is aneurysmal and akinetic consistent with his prior infarct.  There was grade 1 diastolic dysfunction.  Valves were essentially normal.  Presently, his systolic feels well.  He continues to work at [employer].  He denies chest pain or shortness of breath.  He is on Entresto 24/26 mg twice a day, carvedilol 25 mg twice a day and continues to be on DAPT with aspirin/Plavix.  He is on atorvastatin 80 mg.  He presents for evaluation.   Past Medical History:  Diagnosis Date   ACS (acute coronary syndrome) (HCC) 11/2015   Atypical chest pain    Negative Myoview 2010   Coronary artery disease    a.s/p STEMI in 01/2015 requiring placement of 4 DES to LAD. b. 11/2015: NST showing apical scar with minimal peri-infarct ischemia.   Essential hypertension    Food allergy    Anaphylaxis with shell fish   GERD (gastroesophageal reflux disease)    History of migraines    Hyperlipidemia    Ischemic cardiomyopathy    a. Echo 05/2015: EF 40-45%, apical akineiss with Grade 1 DD.   Myocardial infarction [facility]) 2016   Testicle cancer (HCC)    In remission - followed by Dr. Isabel Caprice; had bilateral orchiectomy and XRT in past    Past Surgical History:  Procedure Laterality Date   CARDIAC CATHETERIZATION N/A 01/15/2015   Procedure: Left  Heart Cath and Coronary Angiography;  Surgeon: Lennette Bihari, MD;  Location: [facility] INVASIVE CV LAB;  Service: Cardiovascular;  Laterality: N/A;   CARDIAC CATHETERIZATION  01/15/2015   Procedure: Coronary Stent Intervention;  Surgeon: Lennette Bihari, MD;  Location: [facility] INVASIVE CV LAB;  Service: Cardiovascular;;   CORONARY BALLOON ANGIOPLASTY N/A 08/17/2017   Procedure: CORONARY BALLOON ANGIOPLASTY;  Surgeon: Marykay Lex, MD;  Location: [facility] INVASIVE CV LAB;  Service: Cardiovascular;  Laterality: N/A;   LEFT HEART CATH AND CORONARY ANGIOGRAPHY N/A 08/17/2017   Procedure: LEFT HEART CATH AND CORONARY ANGIOGRAPHY;  Surgeon: Marykay Lex, MD;  Location: [facility] INVASIVE CV LAB;  Service: Cardiovascular;  Laterality: N/A;   LEFT HEART CATH AND CORONARY ANGIOGRAPHY N/A 11/09/2017   Procedure: LEFT HEART CATH AND CORONARY ANGIOGRAPHY;  Surgeon: Lennette Bihari, MD;  Location: [facility] INVASIVE CV LAB;  Service: Cardiovascular;  Laterality: N/A;   ORCHIECTOMY Bilateral     Allergies  Allergen Reactions   Iodine Anaphylaxis    Shellfish allergy    Shellfish Allergy Anaphylaxis  Penicillins Other (See Comments)    Childhood allergy Has patient had a PCN reaction causing immediate rash, facial/tongue/throat swelling, SOB or lightheadedness with hypotension: Unknown Has patient had a PCN reaction causing severe rash involving mucus membranes or skin necrosis: Unknown Has patient had a PCN reaction that required hospitalization: No Has patient had a PCN reaction occurring within the last 10 years: No If all of the above answers are "NO", then may proceed with Cephalosporin use.     Ace Inhibitors Cough    Current Outpatient Medications  Medication Sig Dispense Refill   aspirin EC 81 MG tablet Take 1 tablet (81 mg total) by mouth daily. (Patient taking differently: Take 81 mg by mouth at bedtime.) 90 tablet 3   atorvastatin (LIPITOR) 80 MG tablet TAKE 1 TABLET(80 MG) BY MOUTH DAILY 90 tablet 3   carvedilol  (COREG) 25 MG tablet TAKE 1 TABLET(25 MG) BY MOUTH TWICE DAILY WITH A MEAL 180 tablet 3   clopidogrel (PLAVIX) 75 MG tablet TAKE 1 TABLET(75 MG) BY MOUTH DAILY 90 tablet 3   doxycycline (PERIOSTAT) 20 MG tablet Take 20 mg by mouth 2 (two) times daily.     EPIPEN 2-PAK 0.3 MG/0.3ML SOAJ injection INJECT 0.3 MLS INTO THE MUSCLE ONCE **PATIENT NEEDS TO SCHEDULE AN OFFICE VISIT** (Patient taking differently: Inject 0.3 mg into the muscle as needed (for an allergic reaction).) 2 Device 0   nitroGLYCERIN (NITROSTAT) 0.4 MG SL tablet DISSOLVE 1 TABLET UNDER THE TONGUE EVERY 5 MINUTES AS NEEDED FOR CHEST PAIN FOR 3 DOSES 25 tablet 2   sacubitril-valsartan (ENTRESTO) 24-26 MG TAKE 1 TABLET BY MOUTH TWICE DAILY 180 tablet 3   spironolactone (ALDACTONE) 25 MG tablet Take 0.5 tablets (12.5 mg total) by mouth daily. 90 tablet 3   Testosterone 1.62 % GEL Apply 4 Pump topically daily.      No current facility-administered medications for this visit.    Social History   Socioeconomic History   Marital status: Married    Spouse name: Not on file   Number of children: Not on file   Years of education: Not on file   Highest education level: Not on file  Occupational History   Not on file  Tobacco Use   Smoking status: Former   Smokeless tobacco: Never   Tobacco comments:    quit around 2010  Vaping Use   Vaping status: Never Used  Substance and Sexual Activity   Alcohol use: Yes    Alcohol/week: 0.0 standard drinks of alcohol    Comment: occasionally   Drug use: No    Comment: remote use of marijuana, quit a long time ago   Sexual activity: Not on file  Other Topics Concern   Not on file  Social History Narrative   Occupation:Senior Trader for [employer]   Divorced    No children      Former smoker    Alcohol Use - yes         Social Drivers of Health   Financial Resource Strain: Patient Declined (03/11/2023)   Overall Financial Resource Strain (CARDIA)    Difficulty of Paying Living  Expenses: Patient declined  Food Insecurity: Patient Declined (03/11/2023)   Hunger Vital Sign    Worried About Running Out of Food in the Last Year: Patient declined    Ran Out of Food in the Last Year: Patient declined  Transportation Needs: Patient Declined (03/11/2023)   PRAPARE - Administrator, Civil Service (Medical): Patient declined  Lack of Transportation (Non-Medical): Patient declined  Physical Activity: Unknown (03/11/2023)   Exercise Vital Sign    Days of Exercise per Week: Patient declined    Minutes of Exercise per Session: Not on file  Stress: Patient Declined (03/11/2023)   [employer] of Occupational Health - Occupational Stress Questionnaire    Feeling of Stress : Patient declined  Social Connections: Unknown (03/11/2023)   Social Connection and Isolation Panel [NHANES]    Frequency of Communication with Friends and Family: Patient declined    Frequency of Social Gatherings with Friends and Family: Patient declined    Attends Religious Services: Patient declined    Database administrator or Organizations: Patient declined    Attends Banker Meetings: Not on file    Marital Status: Patient declined  Intimate Partner Violence: Not At Risk (06/25/2022)   Received from [facility]   HITS    Over the last 12 months how often did your partner physically hurt you?: Never    Over the last 12 months how often did your partner insult you or talk down to you?: Never    Over the last 12 months how often did your partner threaten you with physical harm?: Never    Over the last 12 months how often did your partner scream or curse at you?: Never   Additional social history is notable that he was born in [place], [place].  He lived for over a decade in [place].  He is married.  Former smoker.  Family History  Problem Relation Age of Onset   Heart attack Father        CABG at agae 94   Stroke Paternal Grandfather    Hypertension Other     Hyperlipidemia Other    Heart attack Maternal Grandmother     ROS General: Negative; No fevers, chills, or night sweats HEENT: Negative; No changes in vision or hearing, sinus congestion, difficulty swallowing Pulmonary: Negative; No cough, wheezing, shortness of breath, hemoptysis Cardiovascular: See HPI:  GI: Negative; No nausea, vomiting, diarrhea, or abdominal pain GU: History of testicular cancer, status post orchiectomy.  Gallstones Musculoskeletal: Negative; no myalgias, joint pain, or weakness Hematologic: Negative; no easy bruising, bleeding Endocrine: Negative; no heat/cold intolerance; no diabetes, Neuro: Negative; no changes in balance, headaches Skin: Negative; No rashes or skin lesions Psychiatric: Negative; No behavioral problems, depression Sleep: Negative; No snoring,  daytime sleepiness, hypersomnolence, bruxism, restless legs, hypnogognic hallucinations. Other comprehensive 14 point system review is negative   Physical Exam BP 124/74   Pulse 87   Ht 6' (1.829 m)   Wt 224 lb 6.4 oz (101.8 kg)   SpO2 96%   BMI 30.43 kg/m    Repeat blood pressure by me was 118/72  Wt Readings from Last 3 Encounters:  05/05/23 224 lb 6.4 oz (101.8 kg)  03/15/23 221 lb 9.6 oz (100.5 kg)  01/18/23 222 lb 6.4 oz (100.9 kg)   General: Alert, oriented, no distress.  Skin: normal turgor, no rashes, warm and dry HEENT: Normocephalic, atraumatic. Pupils equal round and reactive to light; sclera anicteric; extraocular muscles intact;  Nose without nasal septal hypertrophy Mouth/Parynx benign; Mallinpatti scale 3 Neck: No JVD, no carotid bruits; normal carotid upstroke Lungs: clear to ausculatation and percussion; no wheezing or rales Chest wall: without tenderness to palpitation Heart: PMI not displaced, RRR, s1 s2 normal, 1/6 systolic murmur, no diastolic murmur, no rubs, gallops, thrills, or heaves Abdomen: soft, nontender; no hepatosplenomehaly, BS+; abdominal aorta nontender  and not dilated by palpation. Back: no CVA tenderness Pulses 2+ Musculoskeletal: full range of motion, normal strength, no joint deformities Extremities: no clubbing cyanosis or edema, Homan's sign negative  Neurologic: grossly nonfocal; Cranial nerves grossly wnl Psychologic: Normal mood and affect    EKG Interpretation Date/Time:  Thursday May 05 2023 11:56:13 EDT Ventricular Rate:  87 PR Interval:  172 QRS Duration:  86 QT Interval:  264 QTC Calculation: 317 R Axis:   39  Text Interpretation: Normal sinus rhythm Anterolateral infarct (cited on or before 09-Jan-2019) T wave abnormality, consider inferior ischemia When compared with ECG of 18-Jan-2023 13:42, Questionable change in initial forces of Lateral leads Confirmed by Nicki Guadalajara ([id number]) on 05/05/2023 12:44:57 PM    January 18, 2023 ECG (independently read by me): NSR at 67, QS V1-3 c/w old anterior MI   July 15, 2021 ECG (independently read by me): NSR at 42, old anteroseptal MI with Q V1-4; no ectopy  March 10, 2021 ECG (independently read by me): Sinus rhythm at 71 bpm.  QS complex V1 through V5.  Normal intervals.  No ectopy.  October 06, 2020 ECG (independently read by me):  NSR at 78; possible LAE, QS V1-5 c/w prior anterior MI  November 2021 ECG (independently read by me): Normal sinus rhythm at 78 bpm.  QS complex V1 through V4 unchanged.  No ectopy.  April 2021 ECG (independently read by me): NSR at 72; QS V1-4 c/w prior anterior MI: no ectopy; normal intervals  November 2020 ECG (independently read by me): Normal sinus rhythm at 78 bpm.  QS complex V1 through V4 consistent with prior anterior MI.  No ectopy.  QTc interval 389 ms.  Parable 1 7 8  ms  December 2019 ECG (independently read by me): Normal sinus rhythm at 75 bpm.  QS complex V1 through V4 consistent with old anterior MI.  Normal intervals.  No ectopy.  November 28, 2017 ECG (independently read by me): Normal sinus rhythm at 83 bpm.  QS complex V1  through V5 consistent with old anterior MI.  Normal intervals.  No ectopy.  March 2019 ECG (independently read by me): Normal sinus rhythm at 79 bpm.  Old anterolateral infarct changes.  QRS complex V1 through V5; normal intervals.  No significant ST changes.  January 2018 ECG (independently read by me): Normal sinus rhythm at 92 bpm.  Anterior Q waves consistent with his prior anterolateral MI.  September 2017 ECG (independently read by me): Sinus rhythm at 83 bpm.  QRS complex V1-V6  concordant with his large anterolateral MI.  May 2017 ECG (independently read by me): Normal sinus rhythm at 79 bpm.  T-wave inversion V2 through V6 and 1 and aVL concordant with his prior anterior to anterolateral MI.  March 2017 ECG (independently read by me): NSR at 80; QS V1-4 with T wave inversion V1-V6, I and aVL  LABS:     Latest Ref Rng & Units 05/02/2023    9:41 AM 06/27/2021    9:16 PM 03/01/2021   11:49 AM  BMP  Glucose 70 - 99 mg/dL 191  478  295   BUN 6 - 24 mg/dL 20  19  18    Creatinine 0.76 - 1.27 mg/dL 6.21  3.08  6.57   BUN/Creat Ratio 9 - 20 19     Sodium 134 - 144 mmol/L 142  140  140   Potassium 3.5 - 5.2 mmol/L 4.4  3.7  3.9   Chloride 96 - 106 mmol/L 105  105  104   CO2 20 - 29 mmol/L 21  25  28    Calcium 8.7 - 10.2 mg/dL 8.9  9.1  9.1         Latest Ref Rng & Units 05/02/2023    9:41 AM 05/30/2020    9:30 AM 05/29/2019    9:55 AM  Hepatic Function  Total Protein 6.0 - 8.5 g/dL 6.7  7.1  6.5   Albumin 3.8 - 4.9 g/dL 4.2  4.5  4.3   AST 0 - 40 IU/L 16  12  13    ALT 0 - 44 IU/L 12  16  14    Alk Phosphatase 44 - 121 IU/L 118  114  102   Total Bilirubin 0.0 - 1.2 mg/dL 1.2  1.9  1.7   Bilirubin, Direct 0.00 - 0.40 mg/dL  4.09         Latest Ref Rng & Units 05/02/2023    9:41 AM 06/27/2021    9:16 PM 03/01/2021   11:49 AM  CBC  WBC 3.4 - 10.8 x10E3/uL 7.5  9.4  9.8   Hemoglobin 13.0 - 17.7 g/dL 81.1  91.4  78.2   Hematocrit 37.5 - 51.0 % 50.3  41.4  41.1   Platelets 150 -  450 x10E3/uL 267  171  163    Lab Results  Component Value Date   MCV 91 05/02/2023   MCV 87.2 06/27/2021   MCV 88.2 03/01/2021    Lab Results  Component Value Date   TSH 0.538 05/02/2023    BNP    Component Value Date/Time   BNP 12.1 03/01/2021 1149   BNP <2.0 06/26/2010 1334    ProBNP    Component Value Date/Time   PROBNP 46 02/07/2018 1216   PROBNP <2.0 pg/mL 10/17/2009 1834     Lipid Panel     Component Value Date/Time   CHOL 111 05/02/2023 0941   TRIG 156 (H) 05/02/2023 0941   HDL 31 (L) 05/02/2023 0941   CHOLHDL 3.6 05/02/2023 0941   CHOLHDL 3.5 11/09/2017 0356   VLDL 27 11/09/2017 0356   LDLCALC 53 05/02/2023 0941   LDLDIRECT 99.9 09/15/2012 1738     RADIOLOGY: No results found.  ECHO: 07/08/2021  1. There is no left ventricular apical thrombus (Definity was used). Left  ventricular ejection fraction, by estimation, is 45 to 50%. The left  ventricle has mildly decreased function. The left ventricle demonstrates  regional wall motion abnormalities  (see scoring diagram/findings for description). Left ventricular diastolic  parameters are consistent with Grade I diastolic dysfunction (impaired  relaxation).   2. Right ventricular systolic function is normal. The right ventricular  size is mildly enlarged.   3. Left atrial size was mildly dilated.   4. The pericardial effusion is surrounding the apex.   5. The mitral valve is normal in structure. Trivial mitral valve  regurgitation. No evidence of mitral stenosis.   6. The aortic valve is normal in structure. Aortic valve regurgitation is  not visualized. No aortic stenosis is present.   7. There is a large abdominal aortic aneurysm with a maximum diameter of  6.3 cm. Unable to establish relationship to the renal arteries on this  cardiac study.   8. The inferior vena cava is normal in size with greater than 50%  respiratory variability, suggesting right atrial pressure of 3 mmHg.    Comparison(s): Prior images reviewed side by side. The left ventricular  hypertrophy has improved. The left ventricular wall motion unchanged.  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 Discussed with Dr. Tresa Endo. Will  order CT aortic angiography and refer  to Vascular Surgery.    Records from [facility] since his last office visit with me were thoroughly reviewed.   ECHO: 03/08/2023  1. Left ventricular ejection fraction, by estimation, is 45 to 50%. The  left ventricle has mildly decreased function. The left ventricle  demonstrates regional wall motion abnormalities (see scoring  diagram/findings for description). There is mild left  ventricular hypertrophy. Left ventricular diastolic parameters are  consistent with Grade I diastolic dysfunction (impaired relaxation).  Elevated left ventricular end-diastolic pressure.   2. Right ventricular systolic function is normal. The right ventricular  size is normal.   3. The mitral valve is normal in structure. No evidence of mitral valve  regurgitation. No evidence of mitral stenosis.   4. The aortic valve is tricuspid. Aortic valve regurgitation is not  visualized. No aortic stenosis is present.   5. The inferior vena cava is normal in size with <50% respiratory  variability, suggesting right atrial pressure of 8 mmHg.     IMPRESSION:  1. History of ST elevation myocardial infarction (STEMI): 01/15/2015   2. CAD S/P percutaneous coronary interventions: December 2016, July 2019   3. Ischemic cardiomyopathy   4. Essential hypertension   5. Abdominal aortic aneurysm (AAA) without rupture [facility]): s/p aortobiiliac endovascular stent graft with celiac, SMA, and single bilateral renal artery branch stents   6. Medication management   7. Gilbert's syndrome     ASSESSMENT AND PLAN: Cesar. Cesar Woods is a 21 -year-old white male who presented to [facility] on the evening of 01/14/2015 with late presentation  anterolateral MI  which most likely occurred the day prior to his presentation.  In the emergency room, he was pain-free. Cardiac catheterization revealed severe diffuse CAD with ulcerated plaque and thrombus and evidence for spontaneous distal LAD dissection.  I performed a very difficult but successful PCI to his LAD with ultimate insertion of 3 Synergy DES stents covering a total of 96 mm and extending from the ostium to the mid LAD.  He also had PTCA of his distal LAD dissection.  A echo Doppler showed an EF of 40-45% and apical akinesis.  He was without  anginal symptomatology until July 2019 when he was found to have very focal 85% in-stent restenosis in the LAD stent at the region of the diagonal vessel with occlusion of the diagonal.  He had excellent collateralization to this diagonal vessel.  He underwent successful cutting balloon.  His last catheterization was on November 09, 2017 which demonstrated patent LAD stents  and there was now both antegrade and extensive retrograde collateralization to the diagonal vessel.  Subsequently he was without recurrent angina.  He was transitioned from losartan to [facility].  He was treated with Eliquis due to apical thrombus which on his echo Doppler study in March 2021 had resolved.  EF was 35 to 40% with grade 1 diastolic dysfunction and there was severe akinesis of the apex and distal anteroseptal lateral wall.  He had developed increasing palpitations leading to a visit to the ER.  Laboratory was negative.  A subsequent Holter monitor did not reveal any significant ectopy with predominant sinus rhythm with an average rate at 78, slowest heart rate at sinus bradycardia at 60 bpm and fastest heart rate being sinus tachycardia at 126 bpm.  He had been titrated on Entresto to 49/51 mg twice daily but due to low  blood pressure this was reduced back to 24/26 mg twice a day.  He has had issues with atypical sharp chest pain and has had several ER evaluations and his chest pain was not felt  to be ischemic in etiology with normal troponins.  He has a history of mild indirect bilirubin elevation most likely consistent with Gilbert syndrome.  I recommended a follow-up echo Doppler study on his current regimen.  His echo Doppler study performed on Jun 30, 2021 showed slightly improved EF at 45 to 50% with an akinetic aneurysm aneurysmally dilated apex.  There was grade 1 diastolic dysfunction.  There was no significant valvular abnormality.  However, on that echo it was noted that he had a very large 6.3 cm abdominal aortic aneurysm.  Subsequent CT imaging confirmed a large areas of measuring up to 6.1 x 8.7 cm for which he was referred to see Dr. Myra Gianotti who recommended he have his surgery done by Dr. Pattricia Boss at [facility] with endovascular repair that will require dealing with all 4 visceral vessels rather than open surgery.  Previously undergone extensive abdominal radiation for his testicular cancer.  I have not seen him since his surgery, but ultimately he underwent complex prolonged 6-hour operation in November 2023 with fenestrated aortobiiliac endovascular stent graft placement with celiac, SMA, and single bilateral renal artery branch stents.  Unfortunately, 1 week later he had a bleed requiring repeat surgery with ultimate stability.  Over the past year, he has been undergoing every 43-month imaging at [facility] with his most recent on January 13, 2023 showing stable stent graft without evidence of kinking, fracture, migration or component separation.  Presently he is without anginal symptomatology.  He continues to be on guideline directed medical therapy with his mildly reduced LV function with carvedilol 25 mg twice a day in addition to Entresto 24/26 mg twice a day.  He has been maintained on atorvastatin 80 mg. He does admit to some occasional shortness of breath with activity.  His ECG is stable with sinus rhythm at 67 with poor R wave progression V1 through V3 consistent with his  prior anterolateral MI.  Since my December 2024 evaluation he underwent a follow-up echo Doppler study on March 08, 2023.  This remained fairly stable with EF at 45 to 50%.  There entire apex is aneurysmal and akinetic which had been previously noted secondary to his initial MI.  With his reduced LV function I am electing to add low-dose spironolactone 12.5 mg daily to his Entresto 24/26 twice daily and carvedilol 25 mg.  I will check a bmet in 3 to 4 weeks to make certain potassium and renal function are stable.  At future evaluation Jardiance may be able to be added to his regimen.  He continues to be seen by Dr. de Peru for primary care.  He is followed every 6 months at [facility] in follow-up of his abdominal stent graft with 4 vessel bifurcations.  I will transition him to the care of Dr. Bryan Lemma and prior to that evaluation repeat echo can be undertaken for consideration of possible initiation of Jardiance or Farxiga.   Lennette Bihari, MD, [facility]  05/09/2023 9:45 AM

## 2023-05-09 ENCOUNTER — Encounter: Payer: Self-pay | Admitting: Cardiovascular Disease

## 2023-06-08 ENCOUNTER — Ambulatory Visit (HOSPITAL_COMMUNITY)

## 2023-06-27 DIAGNOSIS — Z1330 Encounter for screening examination for mental health and behavioral disorders, unspecified: Secondary | ICD-10-CM | POA: Diagnosis not present

## 2023-06-27 DIAGNOSIS — R7989 Other specified abnormal findings of blood chemistry: Secondary | ICD-10-CM | POA: Diagnosis not present

## 2023-08-04 DIAGNOSIS — E041 Nontoxic single thyroid nodule: Secondary | ICD-10-CM | POA: Diagnosis not present

## 2023-08-04 DIAGNOSIS — Z09 Encounter for follow-up examination after completed treatment for conditions other than malignant neoplasm: Secondary | ICD-10-CM | POA: Diagnosis not present

## 2023-08-04 DIAGNOSIS — I429 Cardiomyopathy, unspecified: Secondary | ICD-10-CM | POA: Diagnosis not present

## 2023-08-04 DIAGNOSIS — Z7982 Long term (current) use of aspirin: Secondary | ICD-10-CM | POA: Diagnosis not present

## 2023-08-04 DIAGNOSIS — I252 Old myocardial infarction: Secondary | ICD-10-CM | POA: Diagnosis not present

## 2023-08-04 DIAGNOSIS — E042 Nontoxic multinodular goiter: Secondary | ICD-10-CM | POA: Diagnosis not present

## 2023-08-04 DIAGNOSIS — Z923 Personal history of irradiation: Secondary | ICD-10-CM | POA: Diagnosis not present

## 2023-08-04 DIAGNOSIS — Z95828 Presence of other vascular implants and grafts: Secondary | ICD-10-CM | POA: Diagnosis not present

## 2023-08-04 DIAGNOSIS — R195 Other fecal abnormalities: Secondary | ICD-10-CM | POA: Diagnosis not present

## 2023-08-04 DIAGNOSIS — K8020 Calculus of gallbladder without cholecystitis without obstruction: Secondary | ICD-10-CM | POA: Diagnosis not present

## 2023-08-04 DIAGNOSIS — E279 Disorder of adrenal gland, unspecified: Secondary | ICD-10-CM | POA: Diagnosis not present

## 2023-08-04 DIAGNOSIS — Z7902 Long term (current) use of antithrombotics/antiplatelets: Secondary | ICD-10-CM | POA: Diagnosis not present

## 2023-08-04 DIAGNOSIS — E278 Other specified disorders of adrenal gland: Secondary | ICD-10-CM | POA: Diagnosis not present

## 2023-08-04 DIAGNOSIS — M47819 Spondylosis without myelopathy or radiculopathy, site unspecified: Secondary | ICD-10-CM | POA: Diagnosis not present

## 2023-08-04 DIAGNOSIS — Z8547 Personal history of malignant neoplasm of testis: Secondary | ICD-10-CM | POA: Diagnosis not present

## 2023-08-04 DIAGNOSIS — Z006 Encounter for examination for normal comparison and control in clinical research program: Secondary | ICD-10-CM | POA: Diagnosis not present

## 2023-08-04 DIAGNOSIS — I716 Thoracoabdominal aortic aneurysm, without rupture, unspecified: Secondary | ICD-10-CM | POA: Diagnosis not present

## 2023-08-04 DIAGNOSIS — I10 Essential (primary) hypertension: Secondary | ICD-10-CM | POA: Diagnosis not present

## 2023-08-04 DIAGNOSIS — K219 Gastro-esophageal reflux disease without esophagitis: Secondary | ICD-10-CM | POA: Diagnosis not present

## 2023-08-04 DIAGNOSIS — E785 Hyperlipidemia, unspecified: Secondary | ICD-10-CM | POA: Diagnosis not present

## 2023-08-04 DIAGNOSIS — I2510 Atherosclerotic heart disease of native coronary artery without angina pectoris: Secondary | ICD-10-CM | POA: Diagnosis not present

## 2023-08-04 DIAGNOSIS — Z955 Presence of coronary angioplasty implant and graft: Secondary | ICD-10-CM | POA: Diagnosis not present

## 2023-10-21 ENCOUNTER — Encounter (HOSPITAL_BASED_OUTPATIENT_CLINIC_OR_DEPARTMENT_OTHER): Payer: Self-pay

## 2023-10-24 ENCOUNTER — Other Ambulatory Visit (HOSPITAL_BASED_OUTPATIENT_CLINIC_OR_DEPARTMENT_OTHER)

## 2023-10-24 ENCOUNTER — Encounter (HOSPITAL_BASED_OUTPATIENT_CLINIC_OR_DEPARTMENT_OTHER): Payer: Self-pay

## 2023-10-28 ENCOUNTER — Encounter (HOSPITAL_BASED_OUTPATIENT_CLINIC_OR_DEPARTMENT_OTHER): Payer: Self-pay

## 2023-10-28 ENCOUNTER — Encounter (HOSPITAL_BASED_OUTPATIENT_CLINIC_OR_DEPARTMENT_OTHER): Payer: Self-pay | Admitting: Cardiovascular Disease

## 2023-11-01 ENCOUNTER — Encounter: Payer: Self-pay | Admitting: Cardiology

## 2023-11-01 ENCOUNTER — Ambulatory Visit: Attending: Cardiology | Admitting: Cardiology

## 2023-11-01 VITALS — BP 106/72 | HR 77 | Ht 72.0 in | Wt 231.0 lb

## 2023-11-01 DIAGNOSIS — Z8679 Personal history of other diseases of the circulatory system: Secondary | ICD-10-CM

## 2023-11-01 DIAGNOSIS — I2510 Atherosclerotic heart disease of native coronary artery without angina pectoris: Secondary | ICD-10-CM | POA: Diagnosis not present

## 2023-11-01 DIAGNOSIS — Z9889 Other specified postprocedural states: Secondary | ICD-10-CM

## 2023-11-01 DIAGNOSIS — I255 Ischemic cardiomyopathy: Secondary | ICD-10-CM

## 2023-11-01 DIAGNOSIS — E669 Obesity, unspecified: Secondary | ICD-10-CM

## 2023-11-01 DIAGNOSIS — Z95828 Presence of other vascular implants and grafts: Secondary | ICD-10-CM

## 2023-11-01 DIAGNOSIS — I2109 ST elevation (STEMI) myocardial infarction involving other coronary artery of anterior wall: Secondary | ICD-10-CM

## 2023-11-01 DIAGNOSIS — R079 Chest pain, unspecified: Secondary | ICD-10-CM

## 2023-11-01 DIAGNOSIS — I714 Abdominal aortic aneurysm, without rupture, unspecified: Secondary | ICD-10-CM

## 2023-11-01 DIAGNOSIS — I252 Old myocardial infarction: Secondary | ICD-10-CM

## 2023-11-01 DIAGNOSIS — I5042 Chronic combined systolic (congestive) and diastolic (congestive) heart failure: Secondary | ICD-10-CM

## 2023-11-01 DIAGNOSIS — I10 Essential (primary) hypertension: Secondary | ICD-10-CM

## 2023-11-01 DIAGNOSIS — E785 Hyperlipidemia, unspecified: Secondary | ICD-10-CM

## 2023-11-01 DIAGNOSIS — Z9861 Coronary angioplasty status: Secondary | ICD-10-CM

## 2023-11-01 MED ORDER — NITROGLYCERIN 0.4 MG SL SUBL: SUBLINGUAL_TABLET | SUBLINGUAL | 2 refills | Status: AC

## 2023-11-01 MED ORDER — SPIRONOLACTONE 25 MG PO TABS: 12.5000 mg | ORAL_TABLET | Freq: Every day | ORAL | 3 refills | Status: AC

## 2023-11-01 NOTE — Patient Instructions (Signed)
 Medication Instructions:  Restart Spironolactone  12.5 mg daily *If you need a refill on your cardiac medications before your next appointment, please call your pharmacy*  Lab Work: (If you do not get lab work prior to your scheduled appointment with Dr Anner, then please call our office or send a MyChart message stating that you will need blood work prior to your visit) If you have labs (blood work) drawn today and your tests are completely normal, you will receive your results only by: MyChart Message (if you have MyChart) OR A paper copy in the mail If you have any lab test that is abnormal or we need to change your treatment, we will call you to review the results.  Testing/Procedures: None ordered  Follow-Up: At [facility], you and your health needs are our priority.  As part of our continuing mission to provide you with exceptional heart care, our providers are all part of one team.  This team includes your primary Cardiologist (physician) and Advanced Practice Providers or APPs (Physician Assistants and Nurse Practitioners) who all work together to provide you with the care you need, when you need it.  Your next appointment:   6 month(s)  Provider:   Alm Anner, MD    We recommend signing up for the patient portal called MyChart.  Sign up information is provided on this After Visit Summary.  MyChart is used to connect with patients for Virtual Visits (Telemedicine).  Patients are able to view lab/test results, encounter notes, upcoming appointments, etc.  Non-urgent messages can be sent to your provider as well.   To learn more about what you can do with MyChart, go to [email or web address].

## 2023-11-01 NOTE — Progress Notes (Signed)
 Cardiology Office Note:  .   Date:  11/08/2023  ID:  Cesar Woods, DOB Jul 23, 1965, MRN [id number] PCP: de Peru, Raymond J, MD  [facility] Providers Cardiologist: Former cardiologist: Dr. Charlena Sor Alm Clay, MD     No chief complaint on file.   Patient Profile: .     Cesar Woods is an obese 58 y.o. male with a PMH notable for CAD had PCI with ICM, AAA, HTN and HLD who presents here for 81-month follow-up and to establish new cardiologist care at the request of de Peru, Quintin PARAS, MD.  PMH: STEMI (01/2015)-DES-LAD x 4.  Myoview  in 2017 showed apical scar with minimal peri-infarct ischemia. 3.538, 3.038, and 3.020 mm from the mid LAD to the ostium and PTCA of the distal LAD with low-level inflation tach up his apical initial dissection  08/2017: Cutting Balloon PTCA of distal STENT ISR.  ICM with EF 40 to 45% with apical akinesis by echo April 2017 AAA-followed by Dr. Missy at [facility] status post EVAR (12/2021) Essential hypertension HLD     Cesar Woods was last seen on 05/05/2023 by Dr. Sor for his final visit.  He discussed that the echocardiogram was done in January showing EF 45 to 50% with aneurysmal and akinetic apex consistent with prior infarct.  GR 1 DD during this visit he had no chest pain or dyspnea.  He was on Entresto  24-26 twice daily and carvedilol  25 mg twice daily as well as ASA/Plavix .  On atorvastatin  80 mg doing well.  Added spironolactone  12.5 mg daily.  It seemed like an echo had been ordered but he just had 1 done in January.  Subjective  Discussed the use of AI scribe software for clinical note transcription with the patient, who gave verbal consent to proceed.  History of Present Illness Cesar Woods is a 58 year old male with coronary artery disease and abdominal aortic aneurysm who presents for follow-up of his cardiac condition.  He underwent an endovascular aneurysm repair (EVAR) for an abdominal aortic aneurysm in November  2023 and is currently part of a five-year follow-up study for this procedure.  He has a history of a myocardial infarction affecting the anterior wall of the heart, resulting in an ejection fraction (EF) of 45-50%. He has undergone multiple cardiac catheterizations, with the most recent in October 2019, which showed a side branch with significant stenosis that was treated with balloon angioplasty. He experiences occasional chest pressure that occurs at rest and is not associated with exertion. He carries nitroglycerin  but has not needed to use it recently.  He has not been exercising regularly and has gained approximately 30 pounds since April 2025, coinciding with his wife's breast cancer diagnosis. His diet has primarily consisted of takeout food since then.  He is currently taking aspirin  81 mg daily, Plavix  75 mg daily, atorvastatin  80 mg daily, carvedilol  25 mg twice daily, Entresto  24/26 mg twice daily, and was prescribed spironolactone  12.5 mg daily, which he has not started.  No significant muscle aches or cramping from his medications. He sleeps on three pillows due to previous sleep apnea concerns, not due to shortness of breath. No waking up short of breath, leg swelling, or significant palpitations.  He notes numbness in his middle toes, particularly when lying in bed, which he attributes to a possible neural issue rather than cardiac concerns.  Cardiovascular ROS: positive for - dyspnea on exertion and mostly related to deconditioning.  He is allowed himself  get out of shape dealing with his wife's health care issues. negative for - chest pain, edema, irregular heartbeat, orthopnea, palpitations, paroxysmal nocturnal dyspnea, rapid heart rate, or syncope or near syncope TIA or emesis for eczema claudication  ROS:  Review of Systems -N/A other than noted above.    Objective   Current Meds  Medication Sig   aspirin  EC 81 MG tablet Take 1 tablet (81 mg total) by mouth daily. (Patient  taking differently: Take 81 mg by mouth at bedtime.)   atorvastatin  (LIPITOR ) 80 MG tablet TAKE 1 TABLET(80 MG) BY MOUTH DAILY   carvedilol  (COREG ) 25 MG tablet TAKE 1 TABLET(25 MG) BY MOUTH TWICE DAILY WITH A MEAL   clopidogrel  (PLAVIX ) 75 MG tablet TAKE 1 TABLET(75 MG) BY MOUTH DAILY   EPIPEN  2-PAK 0.3 MG/0.3ML SOAJ injection INJECT 0.3 MLS INTO THE MUSCLE ONCE **PATIENT NEEDS TO SCHEDULE AN OFFICE VISIT** (Patient taking differently: Inject 0.3 mg into the muscle as needed (for an allergic reaction).)   sacubitril -valsartan  (ENTRESTO ) 24-26 MG TAKE 1 TABLET BY MOUTH TWICE DAILY   Testosterone  1.62 % GEL Apply 4 Pump topically daily.    [Refill] nitroGLYCERIN  (NITROSTAT ) 0.4 MG SL tablet DISSOLVE 1 TABLET UNDER THE TONGUE EVERY 5 MINUTES AS NEEDED FOR CHEST PAIN FOR 3 DOSES     Studies Reviewed: SABRA        Lab Results  Component Value Date   CHOL 111 05/02/2023   HDL 31 (L) 05/02/2023   LDLCALC 53 05/02/2023   LDLDIRECT 99.9 09/15/2012   TRIG 156 (H) 05/02/2023   CHOLHDL 3.6 05/02/2023   Lab Results  Component Value Date   WBC 7.5 05/02/2023   HGB 16.7 05/02/2023   HCT 50.3 05/02/2023   MCV 91 05/02/2023   PLT 267 05/02/2023   Lab Results  Component Value Date   NA 142 05/02/2023   K 4.4 05/02/2023   CREATININE 1.04 05/02/2023   EGFR 84 05/02/2023   GLUCOSE 107 (H) 05/02/2023   ECHO: Normal LV size with mildly reduced function with EF of 45 to 50%.  Mild LVH.  GR 1 DD.  Aneurysmal apex with mid to distal apical hypokinesis.  RAP 8 mmHg (02/28/2023)   Myoview  01/10/2019: Large apical infarct previously seen.  No berry for infarct or ischemia.  Apical hypokinesis but no other WMA.  EF estimated roughly 48%.  Intermediate due to reduced EF but no ischemia but no infarction.SABRA    CATH: 11/2017: Widely patent LAD stent with exception of mild 20% ISR at the previous PTCA site.  (Had been previously treated by Cutting Balloon PTCA in July 2019).  The jailed diagonal fills via both  antegrade and retrograde flow.  OM 30 to 50%.  Large dominant RCA proximal 20% narrowing.   Risk Assessment/Calculations:           Physical Exam:   VS:  BP 106/72   Pulse 77   Ht 6' (1.829 m)   Wt 231 lb (104.8 kg)   SpO2 96%   BMI 31.33 kg/m    Wt Readings from Last 3 Encounters:  11/01/23 231 lb (104.8 kg)  05/05/23 224 lb 6.4 oz (101.8 kg)  03/15/23 221 lb 9.6 oz (100.5 kg)      GEN: Well nourished, well groomed in no acute distress; mildly obese NECK: No JVD; No carotid bruits CARDIAC: Normal S1, S2; RRR, no murmurs, rubs, gallops RESPIRATORY:  Clear to auscultation without rales, wheezing or rhonchi ; nonlabored, good air movement. ABDOMEN: Soft,  non-tender, non-distended EXTREMITIES:  No edema; No deformity      ASSESSMENT AND PLAN: .    Problem List Items Addressed This Visit       Cardiology Problems   Abdominal aortic aneurysm (AAA)   Treated at [facility] with EVAR.  Follows there locally.  Plan is for every 5-year follow-up surveillance      Relevant Medications   spironolactone  (ALDACTONE ) 25 MG tablet   nitroGLYCERIN  (NITROSTAT ) 0.4 MG SL tablet   Acute MI, anterolateral wall, subsequent episode of care (HCC) (Chronic)   Sizable anterior infarct confirmed by Myoview  and echocardiographic wall motion abnormalities.  Not really having any active heart failure symptoms. Unfortunate, EF has been chronically down below 40-45 percent, most recent echo showed 45 to 50%. No active angina or heart failure symptoms.  Just seems a little worn out and out of shape.      Relevant Medications   spironolactone  (ALDACTONE ) 25 MG tablet   nitroGLYCERIN  (NITROSTAT ) 0.4 MG SL tablet   CAD S/P percutaneous coronary angioplasty (Chronic)   Coronary artery disease with multiple stents and angioplasty. On dual antiplatelet therapy. - Continue aspirin  81 mg daily -> okay to hold or discontinue. - Continue Plavix  75 mg daily. - Ensure nitroglycerin  prescription is refilled. -  Continue carvedilol  25 mg twice daily, EpiPen  24-26 mg daily, along with spironolactone  12.5 mg daily.      Relevant Medications   spironolactone  (ALDACTONE ) 25 MG tablet   nitroGLYCERIN  (NITROSTAT ) 0.4 MG SL tablet   Chronic combined systolic and diastolic heart failure (HCC) - Primary (Chronic)   Chronic ischemic cardiomyopathy with ejection fraction 45-50%. Anterior wall slightly aneurysmal, compromised pump function. On heart failure medications, nonadherent to spironolactone . - Restart spironolactone  12.5 mg once daily. - Continue carvedilol  25 mg twice daily. - Continue Entresto  24/26 mg twice daily.  Does not appear to be volume overloaded.  Not currently on DOAC.      Relevant Medications   spironolactone  (ALDACTONE ) 25 MG tablet   nitroGLYCERIN  (NITROSTAT ) 0.4 MG SL tablet   Essential hypertension (Chronic)   Blood pressure well-controlled on current medications: Carvedilol  25 mg twice daily, Entresto  24 over 26 mL daily and spironolactone  25 mg daily.      Relevant Medications   spironolactone  (ALDACTONE ) 25 MG tablet   nitroGLYCERIN  (NITROSTAT ) 0.4 MG SL tablet   Hyperlipidemia LDL goal <55 (Chronic)   Hyperlipidemia well controlled with atorvastatin . Recent cholesterol levels satisfactory.  Most recent LDL in March was 53. - Continue atorvastatin  80 mg daily.;  No complaints of myalgias.  PCP likely following up labs.      Relevant Medications   spironolactone  (ALDACTONE ) 25 MG tablet   nitroGLYCERIN  (NITROSTAT ) 0.4 MG SL tablet   Ischemic cardiomyopathy (Chronic)   Reduced EF in the setting of infarct.      Relevant Medications   spironolactone  (ALDACTONE ) 25 MG tablet   nitroGLYCERIN  (NITROSTAT ) 0.4 MG SL tablet     Other   Chest pain of uncertain etiology   Chest discomfort, likely non-anginal, under evaluation Intermittent chest discomfort likely due to weight gain and deconditioning, not ischemia. Stress testing deferred unless symptoms worsen. -  Encourage resumption of regular exercise, such as treadmill walking. - Monitor for chest discomfort during exercise; if symptoms worsen, consider stress testing.      History of endovascular stent graft for abdominal aortic aneurysm (AAA) (Chronic)   Abdominal aortic aneurysm, post-EVAR, under surveillance Status post endovascular aneurysm repair, under five-year follow-up surveillance.  History of ST elevation myocardial infarction (STEMI) (Chronic)   Almost 9 years out from MI.  Moderately reduced EF but otherwise doing relatively well.  No further signs of angina.      Obesity (BMI 30-39.9) (Chronic)   Obesity, recent weight gain Recent weight gain of 30 pounds likely due to lifestyle changes, contributing to chest discomfort and deconditioning. - Encourage dietary modifications and regular exercise to address weight gain.      Status post endovascular aneurysm repair (EVAR) (Chronic)            Follow-Up: Return in about 6 months (around 04/30/2024). Follow-up - If labs not checked by PCP before visit, notify office to order lipids and chemistry.  I spent 48 minutes in the care of [employer] today including reviewing labs (1 minute), reviewing outside labs from [facility] (1 minute), reviewing studies (previous cath films as well as most recent echo and Myoview  reviewed-6 minutes), face to face time discussing treatment options (27), reviewing records from previous notes (4 minutes), 9 minutes dictating, and documenting in the encounter.      Signed, Alm MICAEL Clay, MD, MS Alm Clay, M.D., M.S. Interventional Cardiologist  [facility] Pager # [phone]

## 2023-11-08 ENCOUNTER — Encounter: Payer: Self-pay | Admitting: Cardiology

## 2023-11-08 DIAGNOSIS — E669 Obesity, unspecified: Secondary | ICD-10-CM | POA: Insufficient documentation

## 2023-11-08 NOTE — Assessment & Plan Note (Addendum)
 Chronic ischemic cardiomyopathy with ejection fraction 45-50%. Anterior wall slightly aneurysmal, compromised pump function. On heart failure medications, nonadherent to spironolactone . - Restart spironolactone  12.5 mg once daily. - Continue carvedilol  25 mg twice daily. - Continue Entresto  24/26 mg twice daily.  Does not appear to be volume overloaded.  Not currently on DOAC.

## 2023-11-08 NOTE — Assessment & Plan Note (Addendum)
 Hyperlipidemia well controlled with atorvastatin . Recent cholesterol levels satisfactory.  Most recent LDL in March was 53. - Continue atorvastatin  80 mg daily.;  No complaints of myalgias.  PCP likely following up labs.

## 2023-11-08 NOTE — Assessment & Plan Note (Signed)
 Sizable anterior infarct confirmed by Myoview  and echocardiographic wall motion abnormalities.  Not really having any active heart failure symptoms. Unfortunate, EF has been chronically down below 40-45 percent, most recent echo showed 45 to 50%. No active angina or heart failure symptoms.  Just seems a little worn out and out of shape.

## 2023-11-08 NOTE — Assessment & Plan Note (Signed)
 Coronary artery disease with multiple stents and angioplasty. On dual antiplatelet therapy. - Continue aspirin  81 mg daily -> okay to hold or discontinue. - Continue Plavix  75 mg daily. - Ensure nitroglycerin  prescription is refilled. - Continue carvedilol  25 mg twice daily, EpiPen  24-26 mg daily, along with spironolactone  12.5 mg daily.

## 2023-11-08 NOTE — Assessment & Plan Note (Signed)
 Obesity, recent weight gain Recent weight gain of 30 pounds likely due to lifestyle changes, contributing to chest discomfort and deconditioning. - Encourage dietary modifications and regular exercise to address weight gain.

## 2023-11-08 NOTE — Assessment & Plan Note (Signed)
 Almost 9 years out from MI.  Moderately reduced EF but otherwise doing relatively well.  No further signs of angina.

## 2023-11-08 NOTE — Assessment & Plan Note (Addendum)
 Treated at [facility] with EVAR.  Follows there locally.  Plan is for every 5-year follow-up surveillance

## 2023-11-08 NOTE — Assessment & Plan Note (Signed)
 Blood pressure well-controlled on current medications: Carvedilol  25 mg twice daily, Entresto  24 over 26 mL daily and spironolactone  25 mg daily.

## 2023-11-08 NOTE — Assessment & Plan Note (Signed)
 Reduced EF in the setting of infarct.

## 2023-11-08 NOTE — Assessment & Plan Note (Signed)
 Abdominal aortic aneurysm, post-EVAR, under surveillance Status post endovascular aneurysm repair, under five-year follow-up surveillance.

## 2023-11-08 NOTE — Assessment & Plan Note (Signed)
 Chest discomfort, likely non-anginal, under evaluation Intermittent chest discomfort likely due to weight gain and deconditioning, not ischemia. Stress testing deferred unless symptoms worsen. - Encourage resumption of regular exercise, such as treadmill walking. - Monitor for chest discomfort during exercise; if symptoms worsen, consider stress testing.

## 2024-01-02 ENCOUNTER — Ambulatory Visit
Admission: EM | Admit: 2024-01-02 | Discharge: 2024-01-02 | Disposition: A | Discharge status: Home / Self Care | Attending: Family Medicine | Admitting: Family Medicine

## 2024-01-02 DIAGNOSIS — R059 Cough, unspecified: Secondary | ICD-10-CM

## 2024-01-02 DIAGNOSIS — J069 Acute upper respiratory infection, unspecified: Secondary | ICD-10-CM

## 2024-01-02 MED ORDER — DOXYCYCLINE HYCLATE 100 MG PO CAPS: 100.0000 mg | ORAL_CAPSULE | Freq: Two times a day (BID) | ORAL | 0 refills | Status: AC

## 2024-01-02 NOTE — Discharge Instructions (Addendum)
 Advised patient take medication as directed with food to completion.  Encouraged to increase daily water intake to 64 ounces per day while taking this medication.  Advised if symptoms worsen and/or unresolved please follow-up with your PCP or here for further evaluation.

## 2024-01-02 NOTE — ED Provider Notes (Signed)
 [facility]    CSN: [id number] Arrival date & time: 01/02/24  1102      History   Chief Complaint Chief Complaint  Patient presents with   Cough    HPI Cesar Woods is a 58 y.o. male.   HPI Very pleasant 58 year old male presents with cough for 10 days.  Patient reports wife recently diagnosed/treated for pneumonia. PMH significant for CAD, chronic combined systolic and diastolic heart failure, and testicular cancer.  Patient is currently on Plavix  and denies any unusual bleeding.  Past Medical History:  Diagnosis Date   ACS (acute coronary syndrome) (HCC) 11/2015   Atypical chest pain    Negative Myoview  2010   Coronary artery disease    a.s/p STEMI in 01/2015 requiring placement of 4 DES to LAD. b. 11/2015: NST showing apical scar with minimal peri-infarct ischemia.   Essential hypertension    Food allergy    Anaphylaxis with shell fish   GERD (gastroesophageal reflux disease)    History of migraines    Hyperlipidemia    Ischemic cardiomyopathy    a. Echo 05/2015: EF 40-45%, apical akineiss with Grade 1 DD.   Myocardial infarction (HCC) 2016   Testicle cancer (HCC)    In remission - followed by Dr. 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   Aorta aneurysm 12/28/2021   Post-viral cough syndrome 12/21/2021   Ingrown toenail of left foot 04/28/2021   Chest pain of uncertain etiology 01/09/2019   Chest pain 11/09/2017   Abnormal TSH 08/25/2017   Progressive angina (HCC) 08/16/2017   CAD S/P percutaneous coronary angioplasty 08/10/2017   Ischemic cardiomyopathy 12/10/2015   Essential  hypertension 12/10/2015   Hyperlipidemia LDL goal <55 06/25/2015   Acute MI, anterolateral wall, subsequent episode of care (HCC) 04/20/2015   Chronic combined systolic and diastolic heart failure (HCC) 01/16/2015   History of ST elevation myocardial infarction (STEMI) 01/14/2015   Abdominal pain, RUQ (right upper quadrant) 12/07/2013   Skin lesion 04/07/2012   GOITER, MULTINODULAR 03/09/2010   THYROID  NODULE, RIGHT 03/06/2010   Dysphagia 12/22/2009   History of testicular cancer 10/17/2009   SHORTNESS OF BREATH 10/17/2009   Testicular cancer (HCC) 10/17/2009   ALLERGY, FOOD 03/07/2009   Hypertrophic and atrophic condition of skin 03/07/2009   PALPITATIONS 08/21/2008   Allergic rhinitis 08/12/2008   History of colonic polyps 08/12/2008   Dyslipidemia 07/29/2008   Hypertensive heart disease 07/29/2008   GERD 07/29/2008   CHEST PAIN, ATYPICAL 07/29/2008    Past Surgical History:  Procedure Laterality Date   CARDIAC CATHETERIZATION N/A 01/15/2015   Procedure: Left Heart Cath and Coronary Angiography;  Surgeon: Debby DELENA Sor, MD;  Location: [facility];  Service: Cardiovascular;  Laterality: N/A;   CARDIAC CATHETERIZATION  01/15/2015   Procedure: Coronary Stent Intervention;  Surgeon: Debby DELENA Sor, MD;  Location: [facility];  Service: Cardiovascular;;   CORONARY BALLOON ANGIOPLASTY N/A 08/17/2017   Procedure: CORONARY BALLOON ANGIOPLASTY;  Surgeon: Anner Alm ORN, MD;  Location: [facility] INVASIVE CV LAB;  Service: Cardiovascular;  Laterality: N/A;   LEFT HEART CATH AND CORONARY ANGIOGRAPHY N/A 08/17/2017  Procedure: LEFT HEART CATH AND CORONARY ANGIOGRAPHY;  Surgeon: Anner Alm ORN, MD;  Location: [facility] INVASIVE CV LAB;  Service: Cardiovascular;  Laterality: N/A;   LEFT HEART CATH AND CORONARY ANGIOGRAPHY N/A 11/09/2017   Procedure: LEFT HEART CATH AND CORONARY ANGIOGRAPHY;  Surgeon: Burnard Debby LABOR, MD;  Location: [facility];  Service: Cardiovascular;  Laterality:  N/A;   ORCHIECTOMY Bilateral        Home Medications    Prior to Admission medications   Medication Sig Start Date End Date Taking? Authorizing Provider  doxycycline  (VIBRAMYCIN ) 100 MG capsule Take 1 capsule (100 mg total) by mouth 2 (two) times daily for 7 days. 01/02/24 01/09/24 Yes Teddy Sharper, FNP  aspirin  EC 81 MG tablet Take 1 tablet (81 mg total) by mouth daily. Patient taking differently: Take 81 mg by mouth at bedtime. 01/03/19   Burnard Debby LABOR, MD  atorvastatin  (LIPITOR ) 80 MG tablet TAKE 1 TABLET(80 MG) BY MOUTH DAILY 02/23/23   Burnard Debby LABOR, MD  carvedilol  (COREG ) 25 MG tablet TAKE 1 TABLET(25 MG) BY MOUTH TWICE DAILY WITH A MEAL 02/23/23   Burnard Debby LABOR, MD  clopidogrel  (PLAVIX ) 75 MG tablet TAKE 1 TABLET(75 MG) BY MOUTH DAILY 02/10/23   Burnard Debby LABOR, MD  EPIPEN  2-PAK 0.3 MG/0.3ML SOAJ injection INJECT 0.3 MLS INTO THE MUSCLE ONCE **PATIENT NEEDS TO SCHEDULE AN OFFICE VISIT** Patient taking differently: Inject 0.3 mg into the muscle as needed (for an allergic reaction). 06/12/13   Yoo, Doe-Hyun R, DO  nitroGLYCERIN  (NITROSTAT ) 0.4 MG SL tablet DISSOLVE 1 TABLET UNDER THE TONGUE EVERY 5 MINUTES AS NEEDED FOR CHEST PAIN FOR 3 DOSES 11/01/23   Anner Alm ORN, MD  sacubitril -valsartan  (ENTRESTO ) 24-26 MG TAKE 1 TABLET BY MOUTH TWICE DAILY 02/23/23   Burnard Debby LABOR, MD  spironolactone  (ALDACTONE ) 25 MG tablet Take 0.5 tablets (12.5 mg total) by mouth daily. 11/01/23   Anner Alm ORN, MD  Testosterone  1.62 % GEL Apply 4 Pump topically daily.  05/04/19   [provider]    Family History Family History  Problem Relation Age of Onset   Heart attack Father        CABG at agae 64   Stroke Paternal Grandfather    Hypertension Other    Hyperlipidemia Other    Heart attack Maternal Grandmother     Social History Social History   Tobacco Use   Smoking status: Former   Smokeless tobacco: Never   Tobacco comments:    quit around 2010  Vaping Use   Vaping  status: Never Used  Substance Use Topics   Alcohol use: Yes    Alcohol/week: 0.0 standard drinks of alcohol    Comment: occasionally   Drug use: No    Comment: remote use of marijuana, quit a long time ago     Allergies   Iodine, Shellfish allergy, Penicillins, and Ace inhibitors   Review of Systems Review of Systems  Respiratory:  Positive for cough.   All other systems reviewed and are negative.    Physical Exam Triage Vital Signs ED Triage Vitals  Encounter Vitals Group     BP      Girls Systolic BP Percentile      Girls Diastolic BP Percentile      Boys Systolic BP Percentile      Boys Diastolic BP Percentile      Pulse      Resp      Temp      Temp  src      SpO2      Weight      Height      Head Circumference      Peak Flow      Pain Score      Pain Loc      Pain Education      Exclude from Growth Chart    No data found.  Updated Vital Signs BP 117/75 (BP Location: Right Arm)   Pulse (!) 114   Temp 98.1 F (36.7 C) (Oral)   Resp 17   SpO2 95%    Physical Exam Vitals and nursing note reviewed.  Constitutional:      General: He is not in acute distress.    Appearance: Normal appearance. He is normal weight. He is not ill-appearing.  HENT:     Head: Normocephalic and atraumatic.     Right Ear: Tympanic membrane, ear canal and external ear normal.     Left Ear: Tympanic membrane, ear canal and external ear normal.     Mouth/Throat:     Mouth: Mucous membranes are moist.     Pharynx: Oropharynx is clear.  Eyes:     Extraocular Movements: Extraocular movements intact.     Conjunctiva/sclera: Conjunctivae normal.     Pupils: Pupils are equal, round, and reactive to light.  Cardiovascular:     Rate and Rhythm: Normal rate and regular rhythm.     Heart sounds: Normal heart sounds.  Pulmonary:     Effort: Pulmonary effort is normal.     Breath sounds: Normal breath sounds. No wheezing, rhonchi or rales.     Comments: Infrequent nonproductive  cough on exam Musculoskeletal:        General: Normal range of motion.  Skin:    General: Skin is warm and dry.  Neurological:     General: No focal deficit present.     Mental Status: He is alert and oriented to person, place, and time. Mental status is at baseline.  Psychiatric:        Mood and Affect: Mood normal.        Behavior: Behavior normal.      UC Treatments / Results  Labs (all labs ordered are listed, but only abnormal results are displayed) Labs Reviewed - No data to display  EKG   Radiology No results found.  Procedures Procedures (including critical care time)  Medications Ordered in UC Medications - No data to display  Initial Impression / Assessment and Plan / UC Course  I have reviewed the triage vital signs and the nursing notes.  Pertinent labs & imaging results that were available during my care of the patient were reviewed by me and considered in my medical decision making (see chart for details).     MDM: 1.  Acute URI-Rx'd doxycycline  100 mg capsule: Take 1 capsule twice daily x 7 days; 2.  Cough, unspecified type Rx'd doxycycline  100 mg capsule: Take 1 capsule twice daily x 7 days. Advised patient take medication as directed with food to completion.  Encouraged to increase daily water intake to 64 ounces per day while taking this medication.  Advised if symptoms worsen and/or unresolved please follow-up with your PCP or here for further evaluation.  Patient discharged home, hemodynamically stable. Final Clinical Impressions(s) / UC Diagnoses   Final diagnoses:  Acute URI  Cough, unspecified type     Discharge Instructions      Advised patient take medication as directed with food to  completion.  Encouraged to increase daily water intake to 64 ounces per day while taking this medication.  Advised if symptoms worsen and/or unresolved please follow-up with your PCP or here for further evaluation.     ED Prescriptions     Medication Sig  Dispense Auth. Provider   doxycycline  (VIBRAMYCIN ) 100 MG capsule Take 1 capsule (100 mg total) by mouth 2 (two) times daily for 7 days. 14 capsule Matthew Cina, FNP      PDMP not reviewed this encounter.   Teddy Sharper, FNP 01/02/24 [id number]

## 2024-01-02 NOTE — ED Triage Notes (Signed)
 Pt c/o cough x 10 days. Wife recently sick as well, dx with pneumonia. No OTC meds taken due to HTN.

## 2024-01-19 NOTE — Progress Notes (Signed)
 APOLONIO CUTTING                                          MRN: [id number]   01/19/2024   The [facility] Quality Team Specialist reviewed this patient medical record for the purposes of chart review for care gap closure. The following were reviewed: abstraction for care gap closure-controlling blood pressure.    VBCI Quality Team

## 2024-01-20 ENCOUNTER — Ambulatory Visit
Admission: EM | Admit: 2024-01-20 | Discharge: 2024-01-20 | Disposition: A | Discharge status: Home / Self Care | Attending: Physician Assistant | Admitting: Physician Assistant

## 2024-01-20 DIAGNOSIS — R058 Other specified cough: Secondary | ICD-10-CM | POA: Diagnosis not present

## 2024-01-20 DIAGNOSIS — J9801 Acute bronchospasm: Secondary | ICD-10-CM

## 2024-01-20 MED ORDER — GUAIFENESIN 100 MG/5ML PO LIQD: 100.0000 mg | ORAL | 0 refills | Status: AC | PRN

## 2024-01-20 NOTE — ED Triage Notes (Signed)
 Pt c/o continued cough x 3.5 weeks. Was seen on 11/24, tx with abx. Has finished course and felt slightly for better for 5 or so days, then sxs returned. No OTC meds used.

## 2024-01-20 NOTE — ED Provider Notes (Signed)
 [facility]    CSN: [id number] Arrival date & time: 01/20/24  0944      History   Chief Complaint Chief Complaint  Patient presents with   Cough    HPI Cesar Woods is a 58 y.o. male.  has a past medical history of ACS (acute coronary syndrome) (HCC) (11/2015), Atypical chest pain, Coronary artery disease, Essential hypertension, Food allergy, GERD (gastroesophageal reflux disease), History of migraines, Hyperlipidemia, Ischemic cardiomyopathy, Myocardial infarction (HCC) (2016), and Testicle cancer (HCC).   HPI  Discussed the use of AI scribe software for clinical note transcription with the patient, who gave verbal consent to proceed.  The patient, with a history of heart attack and aortic aneurysm, presents with a persistent severe cough.  He has experienced a severe, non-productive cough for approximately three weeks, which began around the time his wife was diagnosed with pneumonia. The cough persisted after returning from a vacation and stopping antibiotics. He describes the cough as severe and painful, with no phlegm, and the worst he has experienced.  He is concerned about keeping his wife healthy while he is experiencing a severe cough.  No fever, chills, wheezing, vomiting, or diarrhea. He has a sore throat and nasal congestion, but no ear pain. Mild nausea occurs only during severe coughing fits.  He avoids over-the-counter medications due to concerns about raising his blood pressure, given his heart issues. He is not currently taking any medication for his symptoms.    Past Medical History:  Diagnosis Date   ACS (acute coronary syndrome) (HCC) 11/2015   Atypical chest pain    Negative Myoview  2010   Coronary artery disease    a.s/p STEMI in 01/2015 requiring placement of 4 DES to LAD. b. 11/2015: NST showing apical scar with minimal peri-infarct ischemia.   Essential hypertension    Food allergy    Anaphylaxis with shell fish   GERD  (gastroesophageal reflux disease)    History of migraines    Hyperlipidemia    Ischemic cardiomyopathy    a. Echo 05/2015: EF 40-45%, apical akineiss with Grade 1 DD.   Myocardial infarction [facility]) 2016   Testicle cancer (HCC)    In remission - followed by Dr. 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   Aorta aneurysm 12/28/2021   Post-viral cough syndrome 12/21/2021   Ingrown toenail of left foot 04/28/2021   Chest pain of uncertain etiology 01/09/2019   Chest pain 11/09/2017   Abnormal TSH 08/25/2017   Progressive angina (HCC) 08/16/2017   CAD S/P percutaneous coronary angioplasty 08/10/2017   Ischemic cardiomyopathy 12/10/2015   Essential hypertension 12/10/2015   Hyperlipidemia LDL goal <55 06/25/2015   Acute MI, anterolateral wall, subsequent episode of care (HCC) 04/20/2015   Chronic combined systolic and diastolic heart failure (HCC) 01/16/2015   History of ST elevation myocardial infarction (STEMI) 01/14/2015   Abdominal pain, RUQ (right upper quadrant) 12/07/2013   Skin lesion 04/07/2012   GOITER, MULTINODULAR 03/09/2010   THYROID  NODULE, RIGHT 03/06/2010   Dysphagia 12/22/2009   History of testicular cancer 10/17/2009   SHORTNESS OF BREATH 10/17/2009   Testicular cancer (HCC) 10/17/2009  ALLERGY, FOOD 03/07/2009   Hypertrophic and atrophic condition of skin 03/07/2009   PALPITATIONS 08/21/2008   Allergic rhinitis 08/12/2008   History of colonic polyps 08/12/2008   Dyslipidemia 07/29/2008   Hypertensive heart disease 07/29/2008   GERD 07/29/2008   CHEST PAIN, ATYPICAL 07/29/2008    Past Surgical  History:  Procedure Laterality Date   CARDIAC CATHETERIZATION N/A 01/15/2015   Procedure: Left Heart Cath and Coronary Angiography;  Surgeon: Debby DELENA Sor, MD;  Location: [facility];  Service: Cardiovascular;  Laterality: N/A;   CARDIAC CATHETERIZATION  01/15/2015   Procedure: Coronary Stent Intervention;  Surgeon: Debby DELENA Sor, MD;  Location: [facility];  Service: Cardiovascular;;   CORONARY BALLOON ANGIOPLASTY N/A 08/17/2017   Procedure: CORONARY BALLOON ANGIOPLASTY;  Surgeon: Anner Alm ORN, MD;  Location: [facility] INVASIVE CV LAB;  Service: Cardiovascular;  Laterality: N/A;   LEFT HEART CATH AND CORONARY ANGIOGRAPHY N/A 08/17/2017   Procedure: LEFT HEART CATH AND CORONARY ANGIOGRAPHY;  Surgeon: Anner Alm ORN, MD;  Location: [facility] INVASIVE CV LAB;  Service: Cardiovascular;  Laterality: N/A;   LEFT HEART CATH AND CORONARY ANGIOGRAPHY N/A 11/09/2017   Procedure: LEFT HEART CATH AND CORONARY ANGIOGRAPHY;  Surgeon: Sor Debby DELENA, MD;  Location: [facility];  Service: Cardiovascular;  Laterality: N/A;   ORCHIECTOMY Bilateral        Home Medications    Prior to Admission medications  Medication Sig Start Date End Date Taking? Authorizing Provider  guaiFENesin (ROBITUSSIN) 100 MG/5ML liquid Take 5-10 mLs (100-200 mg total) by mouth every 4 (four) hours as needed for cough or to loosen phlegm. 01/20/24  Yes Gagandeep Kossman E, PA-C  aspirin  EC 81 MG tablet Take 1 tablet (81 mg total) by mouth daily. Patient taking differently: Take 81 mg by mouth at bedtime. 01/03/19   Sor Debby DELENA, MD  atorvastatin  (LIPITOR ) 80 MG tablet TAKE 1 TABLET(80 MG) BY MOUTH DAILY 02/23/23   Sor Debby DELENA, MD  carvedilol  (COREG ) 25 MG tablet TAKE 1 TABLET(25 MG) BY MOUTH TWICE DAILY WITH A MEAL 02/23/23   Sor Debby DELENA, MD  clopidogrel  (PLAVIX ) 75 MG tablet TAKE 1 TABLET(75 MG) BY MOUTH DAILY 02/10/23   Sor Debby DELENA, MD  EPIPEN  2-PAK 0.3 MG/0.3ML SOAJ injection INJECT 0.3 MLS INTO THE MUSCLE ONCE  **PATIENT NEEDS TO SCHEDULE AN OFFICE VISIT** Patient taking differently: Inject 0.3 mg into the muscle as needed (for an allergic reaction). 06/12/13   Yoo, Doe-Hyun R, DO  nitroGLYCERIN  (NITROSTAT ) 0.4 MG SL tablet DISSOLVE 1 TABLET UNDER THE TONGUE EVERY 5 MINUTES AS NEEDED FOR CHEST PAIN FOR 3 DOSES 11/01/23   Anner Alm ORN, MD  sacubitril -valsartan  (ENTRESTO ) 24-26 MG TAKE 1 TABLET BY MOUTH TWICE DAILY 02/23/23   Sor Debby DELENA, MD  spironolactone  (ALDACTONE ) 25 MG tablet Take 0.5 tablets (12.5 mg total) by mouth daily. 11/01/23   Anner Alm ORN, MD  Testosterone  1.62 % GEL Apply 4 Pump topically daily.  05/04/19   [provider]    Family History Family History  Problem Relation Age of Onset   Heart attack Father        CABG at agae 34   Stroke Paternal Grandfather    Hypertension Other    Hyperlipidemia Other    Heart attack Maternal Grandmother     Social History Social History[1]   Allergies   Iodine, Shellfish allergy, Penicillins, and Ace inhibitors   Review of Systems Review of Systems  Constitutional:  Negative for  chills and fever.  HENT:  Positive for congestion and rhinorrhea. Negative for postnasal drip.   Respiratory:  Positive for cough. Negative for shortness of breath and wheezing.   Cardiovascular:  Negative for chest pain.  Gastrointestinal:  Positive for nausea (associated with coughing fits). Negative for diarrhea and vomiting.     Physical Exam Triage Vital Signs ED Triage Vitals  Encounter Vitals Group     BP 01/20/24 1038 118/73     Girls Systolic BP Percentile --      Girls Diastolic BP Percentile --      Boys Systolic BP Percentile --      Boys Diastolic BP Percentile --      Pulse Rate 01/20/24 1038 86     Resp 01/20/24 1038 17     Temp 01/20/24 1038 98.1 F (36.7 C)     Temp Source 01/20/24 1038 Oral     SpO2 01/20/24 1038 96 %     Weight --      Height --      Head Circumference --      Peak Flow --      Pain Score  01/20/24 1039 0     Pain Loc --      Pain Education --      Exclude from Growth Chart --    No data found.  Updated Vital Signs BP 118/73 (BP Location: Right Arm)   Pulse 86   Temp 98.1 F (36.7 C) (Oral)   Resp 17   SpO2 96%   Visual Acuity Right Eye Distance:   Left Eye Distance:   Bilateral Distance:    Right Eye Near:   Left Eye Near:    Bilateral Near:     Physical Exam Vitals reviewed.  Constitutional:      General: He is awake. He is not in acute distress.    Appearance: Normal appearance. He is well-developed and well-groomed. He is not ill-appearing, toxic-appearing or diaphoretic.  HENT:     Head: Normocephalic and atraumatic.     Mouth/Throat:     Lips: Pink.     Mouth: Mucous membranes are moist.     Pharynx: Oropharynx is clear. Uvula midline. No pharyngeal swelling, oropharyngeal exudate, posterior oropharyngeal erythema, uvula swelling or postnasal drip.     Tonsils: No tonsillar exudate or tonsillar abscesses.  Eyes:     Extraocular Movements: Extraocular movements intact.     Conjunctiva/sclera: Conjunctivae normal.  Cardiovascular:     Rate and Rhythm: Normal rate and regular rhythm.     Heart sounds: Normal heart sounds. No murmur heard.    No friction rub. No gallop.  Pulmonary:     Effort: Pulmonary effort is normal.     Breath sounds: Normal breath sounds. No decreased air movement. No decreased breath sounds, wheezing, rhonchi or rales.  Musculoskeletal:     Cervical back: Normal range of motion.  Neurological:     Mental Status: He is alert and oriented to person, place, and time.  Psychiatric:        Attention and Perception: Attention normal.        Mood and Affect: Mood normal.        Speech: Speech normal.        Behavior: Behavior normal. Behavior is cooperative.      UC Treatments / Results  Labs (all labs ordered are listed, but only abnormal results are displayed) Labs Reviewed - No data to display  EKG   Radiology No  results found.  Procedures Procedures (including critical care time)  Medications Ordered in UC Medications - No data to display  Initial Impression / Assessment and Plan / UC Course  I have reviewed the triage vital signs and the nursing notes.  Pertinent labs & imaging results that were available during my care of the patient were reviewed by me and considered in my medical decision making (see chart for details).      Final Clinical Impressions(s) / UC Diagnoses   Final diagnoses:  Post-viral cough syndrome  Cough due to bronchospasm   Post-viral cough syndrome Persistent cough following recent illness, likely post-viral cough syndrome. No fever, chills, or wheezing. Lungs clear on examination. No phlegm production. Mild nausea associated with severe coughing fits. No history of asthma or other respiratory conditions. Avoidance of over-the-counter medications due to heart condition. Robitussin (dextromethorphan) considered safest option for cough management. Inhalers avoided due to potential heart rate increase. Steroids not recommended due to potential aortic weakness. - Prescribed Robitussin (dextromethorphan) for cough management. - Advised to monitor for symptoms such as wet cough, fever, or difficulty breathing and return if these occur.  History of aortic aneurysm, status post repair Aortic aneurysm repaired with stents. Concerns about potential complications with medications affecting heart rate or aortic integrity. Avoidance of medications that may cause heart rate increase or aortic weakness. - Avoid medications that may cause heart rate increase or aortic weakness.     Discharge Instructions      VISIT SUMMARY:  You came in today because of a severe, non-productive cough that has lasted for about three weeks. This started around the time your wife was diagnosed with pneumonia. You have no fever, chills, wheezing, vomiting, or diarrhea, but you do have a sore throat  and nasal congestion. You are concerned about keeping your wife healthy while you are experiencing this severe cough.  YOUR PLAN:  -POST-VIRAL COUGH SYNDROME: Post-viral cough syndrome is a persistent cough that follows a viral illness. Your lungs are clear, and there is no phlegm production. To manage your cough, I have prescribed Robitussin (dextromethorphan), which is safe for you given your heart condition. Please monitor for any new symptoms like a wet cough, fever, or difficulty breathing and return if these occur.  -HISTORY OF AORTIC ANEURYSM, STATUS POST REPAIR: You have a history of an aortic aneurysm that was repaired with stents. It is important to avoid medications that may increase your heart rate or weaken your aorta. Continue to avoid such medications to prevent any complications.  INSTRUCTIONS:  Please take Robitussin (dextromethorphan) as prescribed to manage your cough. Monitor for any new symptoms such as a wet cough, fever, or difficulty breathing and return if these occur. Continue to avoid medications that may increase your heart rate or weaken your aorta.    ED Prescriptions     Medication Sig Dispense Auth. Provider   guaiFENesin (ROBITUSSIN) 100 MG/5ML liquid Take 5-10 mLs (100-200 mg total) by mouth every 4 (four) hours as needed for cough or to loosen phlegm. 180 mL Daxon Kyne E, PA-C      PDMP not reviewed this encounter.    [1]  Social History Tobacco Use   Smoking status: Former   Smokeless tobacco: Never   Tobacco comments:    quit around 2010  Vaping Use   Vaping status: Never Used  Substance Use Topics   Alcohol use: Yes    Alcohol/week: 0.0 standard drinks of alcohol    Comment: occasionally   Drug  use: No    Comment: remote use of marijuana, quit a long time ago     Kinnick Maus, [place] BRAVO, PA-C 01/20/24 1155

## 2024-01-20 NOTE — Discharge Instructions (Signed)
 VISIT SUMMARY:  You came in today because of a severe, non-productive cough that has lasted for about three weeks. This started around the time your wife was diagnosed with pneumonia. You have no fever, chills, wheezing, vomiting, or diarrhea, but you do have a sore throat and nasal congestion. You are concerned about keeping your wife healthy while you are experiencing this severe cough.  YOUR PLAN:  -POST-VIRAL COUGH SYNDROME: Post-viral cough syndrome is a persistent cough that follows a viral illness. Your lungs are clear, and there is no phlegm production. To manage your cough, I have prescribed Robitussin (dextromethorphan), which is safe for you given your heart condition. Please monitor for any new symptoms like a wet cough, fever, or difficulty breathing and return if these occur.  -HISTORY OF AORTIC ANEURYSM, STATUS POST REPAIR: You have a history of an aortic aneurysm that was repaired with stents. It is important to avoid medications that may increase your heart rate or weaken your aorta. Continue to avoid such medications to prevent any complications.  INSTRUCTIONS:  Please take Robitussin (dextromethorphan) as prescribed to manage your cough. Monitor for any new symptoms such as a wet cough, fever, or difficulty breathing and return if these occur. Continue to avoid medications that may increase your heart rate or weaken your aorta.

## 2024-01-26 DIAGNOSIS — M47814 Spondylosis without myelopathy or radiculopathy, thoracic region: Secondary | ICD-10-CM | POA: Diagnosis not present

## 2024-01-26 DIAGNOSIS — E785 Hyperlipidemia, unspecified: Secondary | ICD-10-CM | POA: Diagnosis not present

## 2024-01-26 DIAGNOSIS — I716 Thoracoabdominal aortic aneurysm, without rupture, unspecified: Secondary | ICD-10-CM | POA: Diagnosis not present

## 2024-01-26 DIAGNOSIS — I2510 Atherosclerotic heart disease of native coronary artery without angina pectoris: Secondary | ICD-10-CM | POA: Diagnosis not present

## 2024-01-26 DIAGNOSIS — I252 Old myocardial infarction: Secondary | ICD-10-CM | POA: Diagnosis not present

## 2024-01-26 DIAGNOSIS — Z87891 Personal history of nicotine dependence: Secondary | ICD-10-CM | POA: Diagnosis not present

## 2024-01-26 DIAGNOSIS — Z006 Encounter for examination for normal comparison and control in clinical research program: Secondary | ICD-10-CM | POA: Diagnosis not present

## 2024-01-26 DIAGNOSIS — Z09 Encounter for follow-up examination after completed treatment for conditions other than malignant neoplasm: Secondary | ICD-10-CM | POA: Diagnosis not present

## 2024-01-26 DIAGNOSIS — I10 Essential (primary) hypertension: Secondary | ICD-10-CM | POA: Diagnosis not present

## 2024-01-26 DIAGNOSIS — Z95828 Presence of other vascular implants and grafts: Secondary | ICD-10-CM | POA: Diagnosis not present

## 2024-01-26 DIAGNOSIS — K8020 Calculus of gallbladder without cholecystitis without obstruction: Secondary | ICD-10-CM | POA: Diagnosis not present

## 2024-02-06 ENCOUNTER — Other Ambulatory Visit: Payer: Self-pay

## 2024-02-07 ENCOUNTER — Telehealth: Payer: Self-pay | Admitting: Cardiology

## 2024-02-07 NOTE — Telephone Encounter (Signed)
" °*  STAT* If patient is at the pharmacy, call can be transferred to refill team.   1. Which medications need to be refilled? (please list name of each medication and dose if known)  clopidogrel  (PLAVIX ) 75 MG tablet   2. Would you like to learn more about the convenience, safety, & potential cost savings by using the [facility]? no   3. Are you open to using the [facility] (Type [facility]. no   4. Which pharmacy/location (including street and city if local pharmacy) is medication to be sent to? [id number] - [place], [place] - [address]     5. Do they need a 30 day or 90 day supply? 90 day    "

## 2024-02-08 MED ORDER — CLOPIDOGREL BISULFATE 75 MG PO TABS: 75.0000 mg | ORAL_TABLET | Freq: Every day | ORAL | 2 refills | Status: AC

## 2024-02-08 NOTE — Telephone Encounter (Signed)
"   Disp Refills Start End   clopidogrel  (PLAVIX ) 75 MG tablet 90 tablet 2 02/08/2024 --   Sig - Route: Take 1 tablet (75 mg total) by mouth daily. - Oral   Sent to pharmacy as: clopidogrel  (PLAVIX ) 75 MG tablet   E-Prescribing Status: Receipt confirmed by pharmacy (02/08/2024  8:02 AM EST)    "

## 2024-02-17 ENCOUNTER — Other Ambulatory Visit (HOSPITAL_COMMUNITY): Payer: Self-pay

## 2024-02-17 ENCOUNTER — Other Ambulatory Visit: Payer: Self-pay | Admitting: Cardiology

## 2024-02-17 MED ORDER — SACUBITRIL-VALSARTAN 24-26 MG PO TABS
1.0000 | ORAL_TABLET | Freq: Two times a day (BID) | ORAL | 2 refills | Status: DC
  Filled 2024-02-17: qty 60, 30d supply, fill #0

## 2024-02-17 MED ORDER — CARVEDILOL 25 MG PO TABS: 25.0000 mg | ORAL_TABLET | Freq: Two times a day (BID) | ORAL | 2 refills | Status: AC

## 2024-02-17 MED ORDER — ATORVASTATIN CALCIUM 80 MG PO TABS: 80.0000 mg | ORAL_TABLET | Freq: Every day | ORAL | 3 refills | Status: AC

## 2024-02-20 ENCOUNTER — Other Ambulatory Visit: Payer: Self-pay

## 2024-02-21 ENCOUNTER — Other Ambulatory Visit (HOSPITAL_COMMUNITY): Payer: Self-pay

## 2024-02-21 ENCOUNTER — Other Ambulatory Visit: Payer: Self-pay | Admitting: Cardiology

## 2024-02-21 MED ORDER — SACUBITRIL-VALSARTAN 24-26 MG PO TABS: 1.0000 | ORAL_TABLET | Freq: Two times a day (BID) | ORAL | 2 refills | Status: AC

## 2024-02-24 ENCOUNTER — Other Ambulatory Visit (HOSPITAL_COMMUNITY): Payer: Self-pay
